# Patient Record
Sex: Male | Born: 1950
Health system: Southern US, Community
[De-identification: ages and names within clinical notes are randomized; demographics above are authoritative.]

## PROBLEM LIST (undated history)

## (undated) DIAGNOSIS — I10 Essential (primary) hypertension: Secondary | ICD-10-CM

## (undated) DIAGNOSIS — I7100 Dissection of unspecified site of aorta: Secondary | ICD-10-CM

## (undated) DIAGNOSIS — I38 Endocarditis, valve unspecified: Secondary | ICD-10-CM

## (undated) DIAGNOSIS — I82409 Acute embolism and thrombosis of unspecified deep veins of unspecified lower extremity: Secondary | ICD-10-CM

## (undated) DIAGNOSIS — I509 Heart failure, unspecified: Secondary | ICD-10-CM

## (undated) DIAGNOSIS — Z95828 Presence of other vascular implants and grafts: Secondary | ICD-10-CM

## (undated) DIAGNOSIS — M869 Osteomyelitis, unspecified: Secondary | ICD-10-CM

## (undated) DIAGNOSIS — K5900 Constipation, unspecified: Secondary | ICD-10-CM

## (undated) DIAGNOSIS — I82411 Acute embolism and thrombosis of right femoral vein: Secondary | ICD-10-CM

## (undated) DIAGNOSIS — M009 Pyogenic arthritis, unspecified: Secondary | ICD-10-CM

## (undated) HISTORY — PX: IRRIGATION AND DEBRIDEMENT ABSCESS: SHX5252

---

## 1985-05-15 HISTORY — PX: REPAIR EXTENSOR TENDON HAND: SUR1171

## 1993-05-15 DIAGNOSIS — I82409 Acute embolism and thrombosis of unspecified deep veins of unspecified lower extremity: Secondary | ICD-10-CM

## 1993-05-15 HISTORY — DX: Acute embolism and thrombosis of unspecified deep veins of unspecified lower extremity: I82.409

## 2013-10-14 DIAGNOSIS — M7989 Other specified soft tissue disorders: Secondary | ICD-10-CM | POA: Diagnosis not present

## 2013-10-14 DIAGNOSIS — M79609 Pain in unspecified limb: Secondary | ICD-10-CM | POA: Diagnosis not present

## 2014-07-14 DIAGNOSIS — I82411 Acute embolism and thrombosis of right femoral vein: Secondary | ICD-10-CM

## 2014-07-14 HISTORY — DX: Acute embolism and thrombosis of right femoral vein: I82.411

## 2014-07-16 ENCOUNTER — Encounter (HOSPITAL_COMMUNITY): Payer: Self-pay | Admitting: Emergency Medicine

## 2014-07-16 ENCOUNTER — Emergency Department (HOSPITAL_COMMUNITY): Payer: Medicare Other

## 2014-07-16 ENCOUNTER — Inpatient Hospital Stay (HOSPITAL_COMMUNITY)
Admission: EM | Admit: 2014-07-16 | Discharge: 2014-07-28 | DRG: 252 | Disposition: A | Discharge status: Home / Self Care | Payer: Medicare Other | Attending: Surgery | Admitting: Surgery

## 2014-07-16 DIAGNOSIS — R52 Pain, unspecified: Secondary | ICD-10-CM

## 2014-07-16 DIAGNOSIS — J9811 Atelectasis: Secondary | ICD-10-CM | POA: Diagnosis present

## 2014-07-16 DIAGNOSIS — I82412 Acute embolism and thrombosis of left femoral vein: Secondary | ICD-10-CM | POA: Diagnosis present

## 2014-07-16 DIAGNOSIS — N289 Disorder of kidney and ureter, unspecified: Secondary | ICD-10-CM | POA: Diagnosis not present

## 2014-07-16 DIAGNOSIS — I82419 Acute embolism and thrombosis of unspecified femoral vein: Secondary | ICD-10-CM

## 2014-07-16 DIAGNOSIS — J90 Pleural effusion, not elsewhere classified: Secondary | ICD-10-CM | POA: Diagnosis present

## 2014-07-16 DIAGNOSIS — I7100 Dissection of unspecified site of aorta: Secondary | ICD-10-CM | POA: Diagnosis present

## 2014-07-16 DIAGNOSIS — N182 Chronic kidney disease, stage 2 (mild): Secondary | ICD-10-CM | POA: Diagnosis present

## 2014-07-16 DIAGNOSIS — I7101 Dissection of thoracic aorta: Secondary | ICD-10-CM | POA: Diagnosis not present

## 2014-07-16 DIAGNOSIS — F17210 Nicotine dependence, cigarettes, uncomplicated: Secondary | ICD-10-CM | POA: Diagnosis present

## 2014-07-16 DIAGNOSIS — R0602 Shortness of breath: Secondary | ICD-10-CM

## 2014-07-16 DIAGNOSIS — I71019 Dissection of thoracic aorta, unspecified: Secondary | ICD-10-CM

## 2014-07-16 DIAGNOSIS — G9341 Metabolic encephalopathy: Secondary | ICD-10-CM | POA: Diagnosis present

## 2014-07-16 DIAGNOSIS — I129 Hypertensive chronic kidney disease with stage 1 through stage 4 chronic kidney disease, or unspecified chronic kidney disease: Secondary | ICD-10-CM | POA: Diagnosis present

## 2014-07-16 DIAGNOSIS — R079 Chest pain, unspecified: Secondary | ICD-10-CM

## 2014-07-16 DIAGNOSIS — I779 Disorder of arteries and arterioles, unspecified: Secondary | ICD-10-CM

## 2014-07-16 DIAGNOSIS — I2699 Other pulmonary embolism without acute cor pulmonale: Secondary | ICD-10-CM | POA: Diagnosis present

## 2014-07-16 DIAGNOSIS — Z86711 Personal history of pulmonary embolism: Secondary | ICD-10-CM

## 2014-07-16 DIAGNOSIS — I10 Essential (primary) hypertension: Secondary | ICD-10-CM

## 2014-07-16 HISTORY — DX: Heart failure, unspecified: I50.9

## 2014-07-16 HISTORY — DX: Osteomyelitis, unspecified: M86.9

## 2014-07-16 HISTORY — DX: Essential (primary) hypertension: I10

## 2014-07-16 HISTORY — DX: Endocarditis, valve unspecified: I38

## 2014-07-16 HISTORY — DX: Pyogenic arthritis, unspecified: M00.9

## 2014-07-16 LAB — COMPREHENSIVE METABOLIC PANEL
ALK PHOS: 72 U/L (ref 39–117)
ALT: 30 U/L (ref 0–53)
AST: 36 U/L (ref 0–37)
Albumin: 3.4 g/dL — ABNORMAL LOW (ref 3.5–5.2)
Anion gap: 5 (ref 5–15)
BUN: 17 mg/dL (ref 6–23)
CHLORIDE: 108 mmol/L (ref 96–112)
CO2: 27 mmol/L (ref 19–32)
Calcium: 9.2 mg/dL (ref 8.4–10.5)
Creatinine, Ser: 1.47 mg/dL — ABNORMAL HIGH (ref 0.50–1.35)
GFR calc Af Amer: 56 mL/min — ABNORMAL LOW (ref >90)
GFR, EST NON AFRICAN AMERICAN: 48 mL/min — AB (ref >90)
GLUCOSE: 104 mg/dL — AB (ref 70–99)
Potassium: 4.1 mmol/L (ref 3.5–5.1)
Sodium: 140 mmol/L (ref 135–145)
Total Bilirubin: 0.6 mg/dL (ref 0.3–1.2)
Total Protein: 7.1 g/dL (ref 6.0–8.3)

## 2014-07-16 LAB — I-STAT CHEM 8, ED
BUN: 18 mg/dL (ref 6–23)
CREATININE: 1.4 mg/dL — AB (ref 0.50–1.35)
Calcium, Ion: 1.22 mmol/L (ref 1.13–1.30)
Chloride: 107 mmol/L (ref 96–112)
Glucose, Bld: 104 mg/dL — ABNORMAL HIGH (ref 70–99)
HCT: 39 % (ref 39.0–52.0)
HEMOGLOBIN: 13.3 g/dL (ref 13.0–17.0)
Potassium: 4.1 mmol/L (ref 3.5–5.1)
SODIUM: 143 mmol/L (ref 135–145)
TCO2: 21 mmol/L (ref 0–100)

## 2014-07-16 LAB — CBC WITH DIFFERENTIAL/PLATELET
Basophils Absolute: 0 10*3/uL (ref 0.0–0.1)
Basophils Relative: 0 % (ref 0–1)
EOS ABS: 0.2 10*3/uL (ref 0.0–0.7)
Eosinophils Relative: 2 % (ref 0–5)
HEMATOCRIT: 37.1 % — AB (ref 39.0–52.0)
Hemoglobin: 12.7 g/dL — ABNORMAL LOW (ref 13.0–17.0)
LYMPHS ABS: 1.5 10*3/uL (ref 0.7–4.0)
LYMPHS PCT: 23 % (ref 12–46)
MCH: 29.6 pg (ref 26.0–34.0)
MCHC: 34.2 g/dL (ref 30.0–36.0)
MCV: 86.5 fL (ref 78.0–100.0)
MONO ABS: 0.3 10*3/uL (ref 0.1–1.0)
Monocytes Relative: 5 % (ref 3–12)
Neutro Abs: 4.7 10*3/uL (ref 1.7–7.7)
Neutrophils Relative %: 70 % (ref 43–77)
PLATELETS: 144 10*3/uL — AB (ref 150–400)
RBC: 4.29 MIL/uL (ref 4.22–5.81)
RDW: 13.5 % (ref 11.5–15.5)
WBC: 6.7 10*3/uL (ref 4.0–10.5)

## 2014-07-16 LAB — TROPONIN I: Troponin I: <0.03 ng/mL (ref <0.031)

## 2014-07-16 LAB — RAPID URINE DRUG SCREEN, HOSP PERFORMED
AMPHETAMINES: NOT DETECTED
BENZODIAZEPINES: NOT DETECTED
Barbiturates: NOT DETECTED
Cocaine: POSITIVE — AB
Opiates: NOT DETECTED
Tetrahydrocannabinol: NOT DETECTED

## 2014-07-16 LAB — URINALYSIS, ROUTINE W REFLEX MICROSCOPIC
Bilirubin Urine: NEGATIVE
Glucose, UA: NEGATIVE mg/dL
Hgb urine dipstick: NEGATIVE
Ketones, ur: NEGATIVE mg/dL
Leukocytes, UA: NEGATIVE
Nitrite: NEGATIVE
Protein, ur: NEGATIVE mg/dL
Specific Gravity, Urine: 1.019 (ref 1.005–1.030)
UROBILINOGEN UA: 1 mg/dL (ref 0.0–1.0)
pH: 5.5 (ref 5.0–8.0)

## 2014-07-16 MED ORDER — SODIUM CHLORIDE 0.9 % IV BOLUS (SEPSIS)
1000.0000 mL | Freq: Once | INTRAVENOUS | Status: AC
  Administered 2014-07-16: 1000 mL via INTRAVENOUS

## 2014-07-16 MED ORDER — GI COCKTAIL ~~LOC~~
30.0000 mL | Freq: Once | ORAL | Status: AC
  Administered 2014-07-16: 30 mL via ORAL
  Filled 2014-07-16: qty 30

## 2014-07-16 NOTE — ED Provider Notes (Signed)
CSN: [id number]     Arrival date & time 07/16/14  2213 History  This chart was scribed for Edward Weisse Alfonso Patten, MD by Rayfield Citizen, ED Scribe. This patient was seen in room B15C/B15C and the patient's care was started at 11:05 PM.    Chief Complaint  Patient presents with  . Chest Pain   Patient is a 64 y.o. male presenting with chest pain. The history is provided by the patient and the EMS personnel. No language interpreter was used.  Chest Pain Pain location:  Substernal area Pain quality: sharp   Pain radiates to:  Neck and L jaw Pain radiates to the back: no   Pain severity:  Mild Onset quality:  Sudden Duration:  90 minutes Timing:  Constant Progression:  Partially resolved Chronicity:  New Context: drug use and at rest   Relieved by:  Nothing Worsened by:  Deep breathing Ineffective treatments:  None tried Associated symptoms: no abdominal pain, no fever, no lower extremity edema, no shortness of breath and not vomiting   Risk factors: smoking      HPI Comments: Edward Hopkins is a 64 y.o. male with past medical history of osteomyelitis, septic arthritis in both hips, endocarditis who presents to the Emergency Department complaining of 1.5 hours of constant substernal chest pain. He states that his pain began suddenly, gradually worsened for approximately ten minutes after onset and has largely resolved at present, currently rated 2/10. His pain radiates to his neck and the left-side of his jaw and is worse with deep breathing. Patient explains he was playing cards at symptom onset, "drinking some gin" and, per nurse's note, "using some cocaine that may have been laced with something." He denies recent stress, abdominal pain, constipation, fevers. He denies any recent long-term periods of immobility (e.g. no plane or car trips over 6-8 hours). He denies prior experience with similar symptoms.  He is a 1ppd smoker, occasional EtOH user. He is not prescribed any regular medications; he  occasionally takes OTC naproxen. His PCP is in [place]; he is visiting the area (traveled by train).   Past Medical History  Diagnosis Date  . Osteomyelitis   . Septic arthritis     both hips   . Endocarditis    History reviewed. No pertinent past surgical history. History reviewed. No pertinent family history. History  Substance Use Topics  . Smoking status: Current Every Day Smoker  . Smokeless tobacco: Not on file  . Alcohol Use: Yes    Review of Systems  Constitutional: Negative for fever.  Respiratory: Negative for shortness of breath.   Cardiovascular: Positive for chest pain.  Gastrointestinal: Negative for vomiting, abdominal pain and constipation.  All other systems reviewed and are negative.   Allergies  Review of patient's allergies indicates no known allergies.  Home Medications   Prior to Admission medications   Not on File   BP 144/100 mmHg  Pulse 87  Temp(Src) 98.1 F (36.7 C)  Resp 20  Ht 5\' 10"  (1.778 m)  Wt 215 lb (97.523 kg)  BMI 30.85 kg/m2  SpO2 97% Physical Exam  Constitutional: He is oriented to person, place, and time. He appears well-developed and well-nourished. No distress.  HENT:  Head: Normocephalic and atraumatic.  Mouth/Throat: Oropharynx is clear and moist. No oropharyngeal exudate.  Moist mucous membranes  Eyes: EOM are normal. Pupils are equal, round, and reactive to light.  Neck: Normal range of motion. Neck supple.  Cardiovascular: Normal rate, regular rhythm and normal heart  sounds.  Exam reveals no gallop and no friction rub.   No murmur heard. Pulmonary/Chest: Effort normal. No respiratory distress. He has no wheezes. He has no rales.  Abdominal: Soft. He exhibits no mass. There is no tenderness. There is no rebound and no guarding.  Good bowel sounds  Musculoskeletal: Normal range of motion. He exhibits no edema.  Neurological: He is alert and oriented to person, place, and time. He displays normal reflexes.  Skin:  Skin is warm and dry. No rash noted.  Psychiatric: He has a normal mood and affect. His behavior is normal.  Nursing note and vitals reviewed.   ED Course  Procedures   DIAGNOSTIC STUDIES: Oxygen Saturation is 97% on RA, adequate by my interpretation.    COORDINATION OF CARE: 11:13 PM Discussed treatment plan with pt at bedside and pt agreed to plan.   Labs Review Labs Reviewed  CBC WITH DIFFERENTIAL/PLATELET  COMPREHENSIVE METABOLIC PANEL  TROPONIN I  URINALYSIS, ROUTINE W REFLEX MICROSCOPIC  URINE RAPID DRUG SCREEN (HOSP PERFORMED)  ETHANOL  URINE RAPID DRUG SCREEN (HOSP PERFORMED)  I-STAT CHEM 8, ED    Imaging Review No results found.   EKG Interpretation   Date/Time:  Thursday July 16 2014 22:20:26 EST Ventricular Rate:  83 PR Interval:  158 QRS Duration: 140 QT Interval:  388 QTC Calculation: 456 R Axis:   -66 Text Interpretation:  Sinus rhythm Multiple ventricular premature  complexes Probable left atrial enlargement RBBB and LAFB Left ventricular  hypertrophy No previous ECGs available Confirmed by Wyvonnia Dusky  MD, STEPHEN  [id number]) on 07/16/2014 10:34:21 PM      MDM   Final diagnoses:  Pain   Results for orders placed or performed during the hospital encounter of 07/16/14  CBC with Differential/Platelet  Result Value Ref Range   WBC 6.7 4.0 - 10.5 K/uL   RBC 4.29 4.22 - 5.81 MIL/uL   Hemoglobin 12.7 (L) 13.0 - 17.0 g/dL   HCT 37.1 (L) 39.0 - 52.0 %   MCV 86.5 78.0 - 100.0 fL   MCH 29.6 26.0 - 34.0 pg   MCHC 34.2 30.0 - 36.0 g/dL   RDW 13.5 11.5 - 15.5 %   Platelets 144 (L) 150 - 400 K/uL   Neutrophils Relative % 70 43 - 77 %   Neutro Abs 4.7 1.7 - 7.7 K/uL   Lymphocytes Relative 23 12 - 46 %   Lymphs Abs 1.5 0.7 - 4.0 K/uL   Monocytes Relative 5 3 - 12 %   Monocytes Absolute 0.3 0.1 - 1.0 K/uL   Eosinophils Relative 2 0 - 5 %   Eosinophils Absolute 0.2 0.0 - 0.7 K/uL   Basophils Relative 0 0 - 1 %   Basophils Absolute 0.0 0.0 - 0.1 K/uL   Comprehensive metabolic panel  Result Value Ref Range   Sodium 140 135 - 145 mmol/L   Potassium 4.1 3.5 - 5.1 mmol/L   Chloride 108 96 - 112 mmol/L   CO2 27 19 - 32 mmol/L   Glucose, Bld 104 (H) 70 - 99 mg/dL   BUN 17 6 - 23 mg/dL   Creatinine, Ser 1.47 (H) 0.50 - 1.35 mg/dL   Calcium 9.2 8.4 - 10.5 mg/dL   Total Protein 7.1 6.0 - 8.3 g/dL   Albumin 3.4 (L) 3.5 - 5.2 g/dL   AST 36 0 - 37 U/L   ALT 30 0 - 53 U/L   Alkaline Phosphatase 72 39 - 117 U/L   Total Bilirubin 0.6  0.3 - 1.2 mg/dL   GFR calc non Af Amer 48 (L) >90 mL/min   GFR calc Af Amer 56 (L) >90 mL/min   Anion gap 5 5 - 15  Troponin I  Result Value Ref Range   Troponin I <0.03 <0.031 ng/mL  Urinalysis, Routine w reflex microscopic  Result Value Ref Range   Color, Urine YELLOW YELLOW   APPearance CLOUDY (A) CLEAR   Specific Gravity, Urine 1.019 1.005 - 1.030   pH 5.5 5.0 - 8.0   Glucose, UA NEGATIVE NEGATIVE mg/dL   Hgb urine dipstick NEGATIVE NEGATIVE   Bilirubin Urine NEGATIVE NEGATIVE   Ketones, ur NEGATIVE NEGATIVE mg/dL   Protein, ur NEGATIVE NEGATIVE mg/dL   Urobilinogen, UA 1.0 0.0 - 1.0 mg/dL   Nitrite NEGATIVE NEGATIVE   Leukocytes, UA NEGATIVE NEGATIVE  Urine rapid drug screen (hosp performed)  Result Value Ref Range   Opiates NONE DETECTED NONE DETECTED   Cocaine POSITIVE (A) NONE DETECTED   Benzodiazepines NONE DETECTED NONE DETECTED   Amphetamines NONE DETECTED NONE DETECTED   Tetrahydrocannabinol NONE DETECTED NONE DETECTED   Barbiturates NONE DETECTED NONE DETECTED  I-Stat Chem 8, ED  Result Value Ref Range   Sodium 143 135 - 145 mmol/L   Potassium 4.1 3.5 - 5.1 mmol/L   Chloride 107 96 - 112 mmol/L   BUN 18 6 - 23 mg/dL   Creatinine, Ser 1.40 (H) 0.50 - 1.35 mg/dL   Glucose, Bld 104 (H) 70 - 99 mg/dL   Calcium, Ion 1.22 1.13 - 1.30 mmol/L   TCO2 21 0 - 100 mmol/L   Hemoglobin 13.3 13.0 - 17.0 g/dL   HCT 39.0 39.0 - 52.0 %   Dg Chest Portable 1 View  07/16/2014   CLINICAL DATA:   Chest pain.  EXAM: PORTABLE CHEST - 1 VIEW  COMPARISON:  None.  FINDINGS: Enlarged aorta, particularly the arch and descending aorta. There is cardiopericardial enlargement as well. Indistinct left diaphragm, likely from the cardiomegaly; no definitive effusion. No failure or pneumonia. No pneumothorax.  Critical Value/emergent results were called by telephone at the time of interpretation on 07/16/2014 at 11:15 pm to Dr. Juleen China, who verbally acknowledged these results.  IMPRESSION: Abnormal aorta, at least aneurysmal. In the setting of chest pain, recommend CTA to evaluate for dissection or mediastinal hematoma.   Electronically Signed   By: Monte Fantasia M.D.   On: 07/16/2014 23:16    Medications  sodium chloride 0.9 % bolus 500 mL (not administered)  fentaNYL (SUBLIMAZE) injection 100 mcg (not administered)  gi cocktail (Maalox,Lidocaine,Donnatal) (30 mLs Oral Given 07/16/14 2329)  sodium chloride 0.9 % bolus 1,000 mL (1,000 mLs Intravenous New Bag/Given 07/16/14 2330)  iohexol (OMNIPAQUE) 350 MG/ML injection 100 mL (100 mLs Intravenous Contrast Given 07/17/14 0057)   CRITICAL CARE Performed by: Carlisle Beers Total critical care time: 91 minutes Critical care time was exclusive of separately billable procedures and treating other patients. Critical care was necessary to treat or prevent imminent or life-threatening deterioration. Critical care was time spent personally by me on the following activities: development of treatment plan with patient and/or surrogate as well as nursing, discussions with consultants, evaluation of patient's response to treatment, examination of patient, obtaining history from patient or surrogate, ordering and performing treatments and interventions, ordering and review of laboratory studies, ordering and review of radiographic studies, pulse oximetry and re-evaluation of patient's condition.  MDM Reviewed: nursing note and vitals Interpretation: labs,  x-ray and ECG (negative troponin positive cocaine  in UDS, on cxr tortuous arch by me) Total time providing critical care: 75-105 minutes. This excludes time spent performing separately reportable procedures and services. Consults: cardiothoracic surgery, radiology viz phone x 2 [employer] and [employer].    I personally performed the services described in this documentation, which was scribed in my presence. The recorded information has been reviewed and is accurate.       Carlisle Beers, MD 07/17/14 [phone]

## 2014-07-16 NOTE — ED Notes (Signed)
Per EMS- pt was drinking gin and did some cocaine that might have been laced with something. Pt began having central chest pain radiating to the neck and jaw to the left. Pt given 324 ASA. Reports pain worse with deep breathes. PT is a smoker. Pt refused nitro.

## 2014-07-16 NOTE — ED Notes (Signed)
Xray tech at bedside.

## 2014-07-17 ENCOUNTER — Encounter (HOSPITAL_COMMUNITY): Payer: Self-pay | Admitting: Radiology

## 2014-07-17 ENCOUNTER — Emergency Department (HOSPITAL_COMMUNITY): Payer: Medicare Other

## 2014-07-17 DIAGNOSIS — G9341 Metabolic encephalopathy: Secondary | ICD-10-CM | POA: Diagnosis present

## 2014-07-17 DIAGNOSIS — I7100 Dissection of unspecified site of aorta: Secondary | ICD-10-CM | POA: Diagnosis not present

## 2014-07-17 DIAGNOSIS — N182 Chronic kidney disease, stage 2 (mild): Secondary | ICD-10-CM | POA: Diagnosis present

## 2014-07-17 DIAGNOSIS — R0602 Shortness of breath: Secondary | ICD-10-CM | POA: Diagnosis not present

## 2014-07-17 DIAGNOSIS — J9811 Atelectasis: Secondary | ICD-10-CM | POA: Diagnosis present

## 2014-07-17 DIAGNOSIS — K922 Gastrointestinal hemorrhage, unspecified: Secondary | ICD-10-CM | POA: Diagnosis not present

## 2014-07-17 DIAGNOSIS — R52 Pain, unspecified: Secondary | ICD-10-CM | POA: Diagnosis not present

## 2014-07-17 DIAGNOSIS — Z86711 Personal history of pulmonary embolism: Secondary | ICD-10-CM | POA: Diagnosis not present

## 2014-07-17 DIAGNOSIS — R0489 Hemorrhage from other sites in respiratory passages: Secondary | ICD-10-CM | POA: Diagnosis not present

## 2014-07-17 DIAGNOSIS — I82409 Acute embolism and thrombosis of unspecified deep veins of unspecified lower extremity: Secondary | ICD-10-CM | POA: Diagnosis not present

## 2014-07-17 DIAGNOSIS — J90 Pleural effusion, not elsewhere classified: Secondary | ICD-10-CM | POA: Diagnosis not present

## 2014-07-17 DIAGNOSIS — N289 Disorder of kidney and ureter, unspecified: Secondary | ICD-10-CM | POA: Diagnosis not present

## 2014-07-17 DIAGNOSIS — I82412 Acute embolism and thrombosis of left femoral vein: Secondary | ICD-10-CM | POA: Diagnosis present

## 2014-07-17 DIAGNOSIS — I7101 Dissection of thoracic aorta: Secondary | ICD-10-CM | POA: Diagnosis not present

## 2014-07-17 DIAGNOSIS — I129 Hypertensive chronic kidney disease with stage 1 through stage 4 chronic kidney disease, or unspecified chronic kidney disease: Secondary | ICD-10-CM | POA: Diagnosis present

## 2014-07-17 DIAGNOSIS — R079 Chest pain, unspecified: Secondary | ICD-10-CM | POA: Diagnosis not present

## 2014-07-17 DIAGNOSIS — I10 Essential (primary) hypertension: Secondary | ICD-10-CM | POA: Diagnosis not present

## 2014-07-17 DIAGNOSIS — I2699 Other pulmonary embolism without acute cor pulmonale: Secondary | ICD-10-CM | POA: Diagnosis not present

## 2014-07-17 DIAGNOSIS — F17210 Nicotine dependence, cigarettes, uncomplicated: Secondary | ICD-10-CM | POA: Diagnosis present

## 2014-07-17 LAB — BASIC METABOLIC PANEL
Anion gap: 7 (ref 5–15)
BUN: 16 mg/dL (ref 6–23)
CO2: 19 mmol/L (ref 19–32)
Calcium: 8 mg/dL — ABNORMAL LOW (ref 8.4–10.5)
Chloride: 109 mmol/L (ref 96–112)
Creatinine, Ser: 1.59 mg/dL — ABNORMAL HIGH (ref 0.50–1.35)
GFR, EST AFRICAN AMERICAN: 51 mL/min — AB (ref >90)
GFR, EST NON AFRICAN AMERICAN: 44 mL/min — AB (ref >90)
Glucose, Bld: 124 mg/dL — ABNORMAL HIGH (ref 70–99)
POTASSIUM: 3.6 mmol/L (ref 3.5–5.1)
SODIUM: 135 mmol/L (ref 135–145)

## 2014-07-17 LAB — CBC
HCT: 33.1 % — ABNORMAL LOW (ref 39.0–52.0)
HEMATOCRIT: 30.6 % — AB (ref 39.0–52.0)
HEMOGLOBIN: 10.3 g/dL — AB (ref 13.0–17.0)
Hemoglobin: 11.2 g/dL — ABNORMAL LOW (ref 13.0–17.0)
MCH: 29.5 pg (ref 26.0–34.0)
MCH: 29.7 pg (ref 26.0–34.0)
MCHC: 33.8 g/dL (ref 30.0–36.0)
MCHC: 33.7 g/dL (ref 30.0–36.0)
MCV: 87.1 fL (ref 78.0–100.0)
MCV: 88.2 fL (ref 78.0–100.0)
PLATELETS: 135 10*3/uL — AB (ref 150–400)
Platelets: 112 10*3/uL — ABNORMAL LOW (ref 150–400)
RBC: 3.8 MIL/uL — AB (ref 4.22–5.81)
RBC: 3.47 MIL/uL — ABNORMAL LOW (ref 4.22–5.81)
RDW: 13.5 % (ref 11.5–15.5)
RDW: 13.8 % (ref 11.5–15.5)
WBC: 6.1 10*3/uL (ref 4.0–10.5)
WBC: 5.9 10*3/uL (ref 4.0–10.5)

## 2014-07-17 LAB — MRSA PCR SCREENING: MRSA by PCR: NEGATIVE

## 2014-07-17 LAB — PROTIME-INR
INR: 1.15 (ref 0.00–1.49)
PROTHROMBIN TIME: 14.8 s (ref 11.6–15.2)

## 2014-07-17 LAB — ABO/RH: ABO/RH(D): B POS

## 2014-07-17 LAB — TYPE AND SCREEN
ABO/RH(D): B POS
Antibody Screen: NEGATIVE

## 2014-07-17 MED ORDER — FENTANYL CITRATE 0.05 MG/ML IJ SOLN
100.0000 ug | Freq: Once | INTRAMUSCULAR | Status: AC
  Administered 2014-07-17: 100 ug via INTRAVENOUS
  Filled 2014-07-17: qty 2

## 2014-07-17 MED ORDER — FAMOTIDINE 20 MG PO TABS
20.0000 mg | ORAL_TABLET | Freq: Two times a day (BID) | ORAL | Status: DC
  Administered 2014-07-17 – 2014-07-28 (×23): 20 mg via ORAL
  Filled 2014-07-17 (×25): qty 1

## 2014-07-17 MED ORDER — SODIUM CHLORIDE 0.9 % IV BOLUS (SEPSIS)
500.0000 mL | Freq: Once | INTRAVENOUS | Status: AC
  Administered 2014-07-17: 500 mL via INTRAVENOUS

## 2014-07-17 MED ORDER — ONDANSETRON HCL 4 MG/2ML IJ SOLN
4.0000 mg | Freq: Four times a day (QID) | INTRAMUSCULAR | Status: DC | PRN
  Administered 2014-07-17: 4 mg via INTRAVENOUS
  Filled 2014-07-17: qty 2

## 2014-07-17 MED ORDER — DEXTROSE-NACL 5-0.45 % IV SOLN
INTRAVENOUS | Status: AC
  Administered 2014-07-17 (×2): via INTRAVENOUS

## 2014-07-17 MED ORDER — SODIUM CHLORIDE 0.9 % IV SOLN: 250.0000 mL | INTRAVENOUS | Status: DC | PRN

## 2014-07-17 MED ORDER — IOHEXOL 350 MG/ML SOLN
100.0000 mL | Freq: Once | INTRAVENOUS | Status: AC | PRN
  Administered 2014-07-17: 100 mL via INTRAVENOUS

## 2014-07-17 MED ORDER — DEXTROSE 5 % IV SOLN
0.5000 mg/min | INTRAVENOUS | Status: DC
  Administered 2014-07-17: 1 mg/min via INTRAVENOUS
  Administered 2014-07-17: 2 mg/min via INTRAVENOUS
  Filled 2014-07-17 (×3): qty 100

## 2014-07-17 MED ORDER — MORPHINE SULFATE 4 MG/ML IJ SOLN
4.0000 mg | INTRAMUSCULAR | Status: DC | PRN
  Administered 2014-07-17: 4 mg via INTRAVENOUS
  Administered 2014-07-17: 2 mg via INTRAVENOUS
  Administered 2014-07-18: 4 mg via INTRAVENOUS
  Filled 2014-07-17 (×5): qty 1

## 2014-07-17 NOTE — ED Notes (Signed)
MD at bedside. 

## 2014-07-17 NOTE — ED Notes (Signed)
Patient transported to CT 

## 2014-07-17 NOTE — ED Notes (Signed)
Patient is resting comfortably. 

## 2014-07-17 NOTE — Progress Notes (Signed)
  Subjective:  Chest pain resolved. A little pain with deep inspiration.  Objective: Vital signs in last 24 hours: Temp:  [97.8 F (36.6 C)-98.1 F (36.7 C)] 97.8 F (36.6 C) (03/04 0430) Pulse Rate:  [68-87] 70 (03/04 0630) Cardiac Rhythm:  [-] Normal sinus rhythm (03/04 0600) Resp:  [13-24] 20 (03/04 0630) BP: (95-144)/(70-109) 112/70 mmHg (03/04 0630) SpO2:  [91 %-100 %] 97 % (03/04 0630) Weight:  [97.523 kg (215 lb)-97.796 kg (215 lb 9.6 oz)] 97.796 kg (215 lb 9.6 oz) (03/04 0434)  Hemodynamic parameters for last 24 hours:    Intake/Output from previous day: 03/03 0701 - 03/04 0700 In: 157 [P.O.:120; I.V.:37] Out: -  Intake/Output this shift:    General appearance: alert and cooperative Neurologic: intact Heart: regular rate and rhythm, S1, S2 normal, no murmur, click, rub or gallop Lungs: clear to auscultation bilaterally Abdomen: soft, non-tender; bowel sounds normal; no masses,  no organomegaly Extremities: extremities normal, atraumatic, no cyanosis or edema Radial, femoral and dp, pt pulses all equal and palpable  Lab Results:  Recent Labs  07/16/14 2259 07/16/14 2324 07/17/14 0520  WBC 6.7  --  6.1  HGB 12.7* 13.3 11.2*  HCT 37.1* 39.0 33.1*  PLT 144*  --  135*   BMET:  Recent Labs  07/16/14 2259 07/16/14 2324  NA 140 143  K 4.1 4.1  CL 108 107  CO2 27  --   GLUCOSE 104* 104*  BUN 17 18  CREATININE 1.47* 1.40*  CALCIUM 9.2  --     PT/INR:  Recent Labs  07/17/14 0156  LABPROT 14.8  INR 1.15   ABG    Component Value Date/Time   TCO2 21 07/16/2014 2324   CBG (last 3)  No results for input(s): GLUCAP in the last 72 hours.  Assessment/Plan: Aortic intramural hematoma. BP is under good control on labetalol 2 mg/min. Check BMET and CBC this afternoon and in am.  Advance diet slowly.  Plan to repeat CT in a few days if creat remains stable. I don't think there is a benefit to repeating it without contrast.  LOS: 0 days     Gilford Raid K 07/17/2014

## 2014-07-17 NOTE — H&P (Signed)
[address]       [place] [address]             [phone]      Cardiothoracic Surgery Admission History and Physical    Edward Hopkins is an 64 y.o. male.   Chief Complaint: chest pain HPI:   The patient is a 64 year old gentleman who is from [place] but has been in [place] for a while living with his girlfriend while he decides about moving down here permanently. He did some cocaine last night and drank a few shots of gin and was playing cards when he started having mid chest pain that worsened and he came to the ER. He was markedly hypertensive on arrival and a CTA of the chest showed what radiology felt was an ascending and descending aortic dissection with mediastinal hematoma. After arrival his pain improved and is currently 2/10. He has a history of hypertension but has not taken his meds for a long time since he ran out and was transitioning from [place] to [facility] with no medical doctor. He smokes 1 ppd for 50 years and drinks alcohol frequently. He has a history of IVDA until 1972 and had a history of endocarditis in the distant past treated with antibiotics.   Past Medical History  Diagnosis Date  . 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 He says he had a groin infection that had to be drained surgically and resulted in bacteremia and septic arthritis   . Septic arthritis     both hips   . Endocarditis many years ago. Does not know which valve. Due to IVDA     History reviewed.  past surgical history: had incision and drainage of right groin infection with sepsis.  History reviewed. No pertinent family history. No history of aortic disease, heart disease or connective tissue disorder.  Social History:  reports that he has been smoking.  1 ppd for 50 years. He does not have any smokeless tobacco history on file. He reports that he drinks alcohol frequently. He reports that he uses illicit drugs (Cocaine and Marijuana). 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 Needs bilateral hip replacement.  Allergies: No Known Allergies   (Not in a hospital admission)  Results for orders placed or performed during the hospital encounter of 07/16/14 (from the past 48 hour(s))  Urinalysis, Routine w reflex microscopic     Status: Abnormal   Collection Time: 07/16/14 10:57 PM  Result Value Ref Range   Color, Urine YELLOW YELLOW   APPearance CLOUDY (A) CLEAR   Specific Gravity, Urine 1.019 1.005 - 1.030   pH 5.5 5.0 - 8.0   Glucose, UA NEGATIVE NEGATIVE mg/dL   Hgb urine dipstick NEGATIVE NEGATIVE   Bilirubin Urine NEGATIVE NEGATIVE   Ketones, ur NEGATIVE NEGATIVE mg/dL   Protein, ur NEGATIVE NEGATIVE mg/dL   Urobilinogen, UA 1.0 0.0 - 1.0 mg/dL   Nitrite NEGATIVE NEGATIVE   Leukocytes, UA NEGATIVE NEGATIVE    Comment: MICROSCOPIC NOT DONE ON URINES WITH NEGATIVE PROTEIN, BLOOD, LEUKOCYTES, NITRITE, OR GLUCOSE <1000 mg/dL.  Urine rapid drug screen (hosp performed)     Status: Abnormal   Collection Time: 07/16/14 10:57 PM  Result Value Ref Range   Opiates NONE DETECTED NONE DETECTED   Cocaine POSITIVE (A) NONE DETECTED   Benzodiazepines NONE DETECTED NONE DETECTED   Amphetamines NONE DETECTED NONE DETECTED   Tetrahydrocannabinol NONE  DETECTED NONE DETECTED   Barbiturates NONE DETECTED NONE DETECTED    Comment:        DRUG SCREEN FOR MEDICAL PURPOSES ONLY.  IF CONFIRMATION IS NEEDED FOR ANY PURPOSE, NOTIFY LAB WITHIN 5 DAYS.        LOWEST DETECTABLE LIMITS FOR URINE DRUG SCREEN Drug Class       Cutoff (ng/mL) Amphetamine      1000 Barbiturate      200 Benzodiazepine   753 Tricyclics       005 Opiates          300 Cocaine          300 THC              50   CBC with Differential/Platelet     Status: Abnormal   Collection Time: 07/16/14 10:59 PM  Result Value Ref Range   WBC 6.7 4.0 - 10.5 K/uL   RBC 4.29 4.22 - 5.81 MIL/uL   Hemoglobin 12.7 (L) 13.0 - 17.0 g/dL   HCT 37.1  (L) 39.0 - 52.0 %   MCV 86.5 78.0 - 100.0 fL   MCH 29.6 26.0 - 34.0 pg   MCHC 34.2 30.0 - 36.0 g/dL   RDW 13.5 11.5 - 15.5 %   Platelets 144 (L) 150 - 400 K/uL   Neutrophils Relative % 70 43 - 77 %   Neutro Abs 4.7 1.7 - 7.7 K/uL   Lymphocytes Relative 23 12 - 46 %   Lymphs Abs 1.5 0.7 - 4.0 K/uL   Monocytes Relative 5 3 - 12 %   Monocytes Absolute 0.3 0.1 - 1.0 K/uL   Eosinophils Relative 2 0 - 5 %   Eosinophils Absolute 0.2 0.0 - 0.7 K/uL   Basophils Relative 0 0 - 1 %   Basophils Absolute 0.0 0.0 - 0.1 K/uL  Comprehensive metabolic panel     Status: Abnormal   Collection Time: 07/16/14 10:59 PM  Result Value Ref Range   Sodium 140 135 - 145 mmol/L   Potassium 4.1 3.5 - 5.1 mmol/L   Chloride 108 96 - 112 mmol/L   CO2 27 19 - 32 mmol/L   Glucose, Bld 104 (H) 70 - 99 mg/dL   BUN 17 6 - 23 mg/dL   Creatinine, Ser 1.47 (H) 0.50 - 1.35 mg/dL   Calcium 9.2 8.4 - 10.5 mg/dL   Total Protein 7.1 6.0 - 8.3 g/dL   Albumin 3.4 (L) 3.5 - 5.2 g/dL   AST 36 0 - 37 U/L   ALT 30 0 - 53 U/L   Alkaline Phosphatase 72 39 - 117 U/L   Total Bilirubin 0.6 0.3 - 1.2 mg/dL   GFR calc non Af Amer 48 (L) >90 mL/min   GFR calc Af Amer 56 (L) >90 mL/min    Comment: (NOTE) The eGFR has been calculated using the CKD EPI equation. This calculation has not been validated in all clinical situations. eGFR's persistently <90 mL/min signify possible Chronic Kidney Disease.    Anion gap 5 5 - 15  Troponin I     Status: None   Collection Time: 07/16/14 10:59 PM  Result Value Ref Range   Troponin I <0.03 <0.031 ng/mL    Comment:        NO INDICATION OF MYOCARDIAL INJURY.   I-Stat Chem 8, ED     Status: Abnormal   Collection Time: 07/16/14 11:24 PM  Result Value Ref Range   Sodium 143 135 - 145 mmol/L  Potassium 4.1 3.5 - 5.1 mmol/L   Chloride 107 96 - 112 mmol/L   BUN 18 6 - 23 mg/dL   Creatinine, Ser 1.40 (H) 0.50 - 1.35 mg/dL   Glucose, Bld 104 (H) 70 - 99 mg/dL   Calcium, Ion 1.22 1.13 -  1.30 mmol/L   TCO2 21 0 - 100 mmol/L   Hemoglobin 13.3 13.0 - 17.0 g/dL   HCT 39.0 39.0 - 52.0 %  Type and screen     Status: None   Collection Time: 07/17/14  1:51 AM  Result Value Ref Range   ABO/RH(D) B POS    Antibody Screen NEG    Sample Expiration 07/20/2014    Dg Chest Portable 1 View  07/16/2014   CLINICAL DATA:  Chest pain.  EXAM: PORTABLE CHEST - 1 VIEW  COMPARISON:  None.  FINDINGS: Enlarged aorta, particularly the arch and descending aorta. There is cardiopericardial enlargement as well. Indistinct left diaphragm, likely from the cardiomegaly; no definitive effusion. No failure or pneumonia. No pneumothorax.  Critical Value/emergent results were called by telephone at the time of interpretation on 07/16/2014 at 11:15 pm to Dr. Juleen China, who verbally acknowledged these results.  IMPRESSION: Abnormal aorta, at least aneurysmal. In the setting of chest pain, recommend CTA to evaluate for dissection or mediastinal hematoma.   Electronically Signed   By: Monte Fantasia M.D.   On: 07/16/2014 23:16   Ct Angio Chest Aorta W/cm &/or Wo/cm  07/17/2014   CLINICAL DATA:  Acute onset of mid chest pain for 3 hours. Pain when taking deep breaths. Initial encounter.  EXAM: CT ANGIOGRAPHY CHEST, ABDOMEN AND PELVIS  TECHNIQUE: Multidetector CT imaging through the chest, abdomen and pelvis was performed using the standard protocol during bolus administration of intravenous contrast. Multiplanar reconstructed images and MIPs were obtained and reviewed to evaluate the vascular anatomy.  CONTRAST:  19m OMNIPAQUE IOHEXOL 350 MG/ML SOLN  COMPARISON:  Chest radiograph performed 07/16/2014  FINDINGS: CTA CHEST FINDINGS  There is evidence of dissection and thrombosis along the ascending and descending thoracic aorta on noncontrast images, with associated moderate to large amount of mediastinal hemorrhage. The dominant mediastinal collection measures 7.9 x 4.9 cm. A small amount of blood is seen tracking into  the left pleural space.  On noncontrast images, there is dilatation of the ascending thoracic aorta to 4.7 cm in maximal diameter, and dilatation of the proximal descending thoracic aorta to 4.8 cm in maximal diameter. This includes the underlying thrombosis. The patent lumen of the thoracic aorta measures 4.4 cm along the ascending thoracic aorta, and 3.8 cm along the proximal descending thoracic aorta.  Mild bilateral atelectasis is noted. No pneumothorax is seen. No masses are identified.  There is rightward deviation of the trachea due to the dilatation of the aortic arch and predominantly left-sided mediastinal hemorrhage. Evaluation for mediastinal lymphadenopathy is limited given mediastinal hemorrhage. The great vessels appear grossly intact.  The thyroid gland is unremarkable in appearance. No axillary lymphadenopathy is seen. Mild bilateral gynecomastia is noted.  No acute osseous abnormalities are seen.  Review of the MIP images confirms the above findings.  CTA ABDOMEN AND PELVIS FINDINGS  The abdominal aorta is unremarkable in appearance, aside from mild scattered calcification along the distal abdominal aorta and its branches, extending into both lower extremities. There is no evidence of dissection. The celiac trunk, superior mesenteric artery, bilateral renal arteries and inferior mesenteric artery appear patent. The IVC is unremarkable in appearance.  Scattered hypodensities are seen within  the liver, nonspecific in appearance. The spleen is unremarkable in appearance. The gallbladder is flattened along the diaphragm and difficult to fully assess. The pancreas and adrenal glands are unremarkable.  Scattered right renal cysts are seen, measuring up to 3.9 cm in size. The left kidney is unremarkable in appearance. No significant perinephric stranding is seen. There is no evidence of hydronephrosis. No renal or ureteral stones are seen.  No free fluid is identified. The small bowel is unremarkable in  appearance. The stomach is within normal limits. No acute vascular abnormalities are seen.  The appendix is normal in caliber and contains air, without evidence for appendicitis. Apparent wall thickening along the mid to distal sigmoid colon may simply reflect intraluminal contents, though sigmoidoscopy could be considered for further evaluation, when and as deemed clinically appropriate.  The bladder is mildly distended and grossly unremarkable. The prostate remains normal in size. No inguinal lymphadenopathy is seen.  No acute osseous abnormalities are identified. Degenerative change is noted at the hip joints bilaterally, with diffuse sclerosis and bony remodeling. Facet disease is noted along the lower thoracic and lumbar spine.  Review of the MIP images confirms the above findings.  IMPRESSION: 1. 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 The dominant mediastinal hematoma measures 7.9 x 4.9 cm. Small amount of blood noted tracking into the left pleural space. 2. Dilatation of the ascending thoracic aorta to 4.7 cm in maximal diameter, and dilatation of the descending thoracic aorta to 4.8 cm in maximal diameter. The patent lumen measures 4.4 cm at the ascending thoracic aorta, and 3.8 cm along the proximal descending thoracic aorta. 3. No dissection flap seen more distally. The abdominal aorta is unremarkable in appearance, aside from mild scattered calcification along the distal abdominal aorta and its branches, extending to both lower extremities. 4. Rightward deviation of the trachea reflects dilatation of the aortic arch and left-sided mediastinal hemorrhage. 5. Apparent wall thickening along the mid to distal sigmoid colon may simply reflect intraluminal contents, though sigmoidoscopy could be considered for further evaluation, when and as deemed clinically appropriate, to exclude mass. 6.  Mild bilateral atelectasis noted. 7. Mild bilateral gynecomastia seen. 8. Scattered nonspecific hypodensities in the liver; scattered right renal cysts seen. 9. Degenerative change noted at the hip joints bilaterally, with diffuse sclerosis and bony remodeling.  Critical Value/emergent results were called by telephone at the time of interpretation on 07/17/2014 at 1:31 am to Dr. Maggie Schwalbe , who verbally acknowledged these results.   Electronically Signed   By: Garald Balding M.D.   On: 07/17/2014 01:59   Ct Angio Abd/pel W/ And/or W/o  07/17/2014   CLINICAL DATA:  Acute onset of mid chest pain for 3 hours. Pain when taking deep breaths. Initial encounter.  EXAM: CT ANGIOGRAPHY CHEST, ABDOMEN AND PELVIS  TECHNIQUE: Multidetector CT imaging through the chest, abdomen and pelvis was performed using the standard protocol during bolus administration of intravenous contrast. Multiplanar reconstructed images and MIPs were obtained and reviewed to evaluate the vascular anatomy.  CONTRAST:  181m OMNIPAQUE IOHEXOL 350 MG/ML SOLN  COMPARISON:  Chest radiograph performed 07/16/2014  FINDINGS: CTA CHEST FINDINGS  There is evidence of dissection and thrombosis along the ascending and descending thoracic aorta on noncontrast images, with associated moderate to large amount of mediastinal hemorrhage. The dominant mediastinal collection measures 7.9 x 4.9 cm. A small amount of blood is seen tracking into the left pleural  space.  On noncontrast images, there is dilatation of the ascending thoracic aorta to 4.7 cm in maximal diameter, and dilatation of the proximal descending thoracic aorta to 4.8 cm in maximal diameter. This includes the underlying thrombosis. The patent lumen of the thoracic aorta measures 4.4 cm along the ascending thoracic aorta, and 3.8 cm along the proximal descending thoracic aorta.  Mild bilateral atelectasis is noted. No pneumothorax is seen. No masses are identified.  There is rightward deviation  of the trachea due to the dilatation of the aortic arch and predominantly left-sided mediastinal hemorrhage. Evaluation for mediastinal lymphadenopathy is limited given mediastinal hemorrhage. The great vessels appear grossly intact.  The thyroid gland is unremarkable in appearance. No axillary lymphadenopathy is seen. Mild bilateral gynecomastia is noted.  No acute osseous abnormalities are seen.  Review of the MIP images confirms the above findings.  CTA ABDOMEN AND PELVIS FINDINGS  The abdominal aorta is unremarkable in appearance, aside from mild scattered calcification along the distal abdominal aorta and its branches, extending into both lower extremities. There is no evidence of dissection. The celiac trunk, superior mesenteric artery, bilateral renal arteries and inferior mesenteric artery appear patent. The IVC is unremarkable in appearance.  Scattered hypodensities are seen within the liver, nonspecific in appearance. The spleen is unremarkable in appearance. The gallbladder is flattened along the diaphragm and difficult to fully assess. The pancreas and adrenal glands are unremarkable.  Scattered right renal cysts are seen, measuring up to 3.9 cm in size. The left kidney is unremarkable in appearance. No significant perinephric stranding is seen. There is no evidence of hydronephrosis. No renal or ureteral stones are seen.  No free fluid is identified. The small bowel is unremarkable in appearance. The stomach is within normal limits. No acute vascular abnormalities are seen.  The appendix is normal in caliber and contains air, without evidence for appendicitis. Apparent wall thickening along the mid to distal sigmoid colon may simply reflect intraluminal contents, though sigmoidoscopy could be considered for further evaluation, when and as deemed clinically appropriate.  The bladder is mildly distended and grossly unremarkable. The prostate remains normal in size. No inguinal lymphadenopathy is seen.  No  acute osseous abnormalities are identified. Degenerative change is noted at the hip joints bilaterally, with diffuse sclerosis and bony remodeling. Facet disease is noted along the lower thoracic and lumbar spine.  Review of the MIP images confirms the above findings.  IMPRESSION: 1. 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 The dominant mediastinal hematoma measures 7.9 x 4.9 cm. Small amount of blood noted tracking into the left pleural space. 2. Dilatation of the ascending thoracic aorta to 4.7 cm in maximal diameter, and dilatation of the descending thoracic aorta to 4.8 cm in maximal diameter. The patent lumen measures 4.4 cm at the ascending thoracic aorta, and 3.8 cm along the proximal descending thoracic aorta. 3. No dissection flap seen more distally. The abdominal aorta is unremarkable in appearance, aside from mild scattered calcification along the distal abdominal aorta and its branches, extending to both lower extremities. 4. Rightward deviation of the trachea reflects dilatation of the aortic arch and left-sided mediastinal hemorrhage. 5. Apparent wall thickening along the mid to distal sigmoid colon may simply reflect intraluminal contents, though sigmoidoscopy could be considered for further evaluation, when and as deemed clinically appropriate, to exclude mass. 6. Mild bilateral atelectasis noted. 7. Mild bilateral gynecomastia seen. 8. Scattered nonspecific hypodensities  in the liver; scattered right renal cysts seen. 9. Degenerative change noted at the hip joints bilaterally, with diffuse sclerosis and bony remodeling.  Critical Value/emergent results were called by telephone at the time of interpretation on 07/17/2014 at 1:31 am to Dr. Maggie Schwalbe , who verbally acknowledged these results.   Electronically Signed   By: Garald Balding M.D.   On: 07/17/2014 01:59    Review of  Systems  Constitutional: Negative for fever, chills, weight loss, malaise/fatigue and diaphoresis.  HENT: Negative.   Eyes: Negative.   Respiratory: Negative for shortness of breath.   Cardiovascular: Positive for chest pain. Negative for palpitations, orthopnea, claudication, leg swelling and PND.  Gastrointestinal: Negative.   Genitourinary: Negative.   Musculoskeletal: Positive for joint pain.       Both hips  Skin: Negative.   Neurological: Negative for dizziness, tingling, sensory change, focal weakness, seizures, loss of consciousness and weakness.  Endo/Heme/Allergies: Negative.   Psychiatric/Behavioral: Negative.     Blood pressure 143/91, pulse 85, temperature 98.1 F (36.7 C), resp. rate 23, height '5\' 10"'  (1.778 m), weight 97.523 kg (215 lb), SpO2 100 %. Physical Exam  Constitutional: He is oriented to person, place, and time. He appears well-developed and well-nourished. No distress.  Short stature  HENT:  Head: Atraumatic.  Mouth/Throat: Oropharynx is clear and moist.  Eyes: Conjunctivae and EOM are normal. Pupils are equal, round, and reactive to light.  Neck: Normal range of motion. Neck supple. No JVD present. No tracheal deviation present. No thyromegaly present.  Cardiovascular: Normal rate, regular rhythm, normal heart sounds and intact distal pulses.  Exam reveals no gallop and no friction rub.   No murmur heard. Both radial, femoral, dp, and pt pulses strong and equal  Respiratory: Effort normal and breath sounds normal. No respiratory distress. He has no wheezes. He has no rales. He exhibits no tenderness.  GI: Soft. Bowel sounds are normal. He exhibits no distension and no mass. There is no tenderness.  Musculoskeletal: Normal range of motion. He exhibits no edema or tenderness.  Lymphadenopathy:    He has no cervical adenopathy.  Neurological: He is alert and oriented to person, place, and time. He has normal strength. No cranial nerve deficit or sensory  deficit.  Skin: Skin is warm and dry. He is not diaphoretic.     Assessment/Plan  I have personally reviewed his CTA of the chest and abdomen. I think he has an acute aortic intramural hematoma due to untreated hypertension with use of cocaine and alcohol tonight. It is not clear where this started but I suspect that it started just beyond the left subclavian and tracked backward into the ascending aorta and distally to the aortic hiatus. There is clotted hematoma around the opacified aortic lumen in the ascending and descending aorta but not a true dissection flap and no flow in a false lumen. There is some motion artifact around the aortic root. There is mild enlargement of the ascending aorta to 43.5 mm and descending aorta to 38 mm. There is mediastinal hematoma anterior to the aortic arch and pulmonary artery but no significant pericardial effusion. There is a small left pleural effusion. The abdominal aorta is unremarkable. His ECG shows no ischemic changes with bifascicular block and LVH. There is no murmur of AI. He has been hypertensive since presentation with pulse in the 70's. I think it is reasonable to admit him to the ICU for tight blood pressure control with labetalol and medical treatment of this. It  is not clear if this started in the ascending aorta and replacing the ascending aorta may not improve his prognosis and may put him through a complicated surgery for little benefit. Hopefully good blood pressure control will allow this to heal without further intervention. I would plan to repeat his CT in a couple days depending on his renal function. I reviewed the CT scans with him and discussed the treatment options and prognosis.   BARTLE,BRYAN K 07/17/2014, 3:11 AM

## 2014-07-17 NOTE — ED Notes (Signed)
Paged cardiothoracic/[facility] to [facility]

## 2014-07-17 NOTE — Progress Notes (Signed)
CT surgery p.m. Rounds  Patient comfortable today after being admitted last night with intramural hematoma of the thoracic aorta  Pain is resolved Blood pressure is well-controlled Continue careful observation

## 2014-07-18 ENCOUNTER — Inpatient Hospital Stay (HOSPITAL_COMMUNITY): Payer: Medicare Other

## 2014-07-18 LAB — COMPREHENSIVE METABOLIC PANEL
ALT: 23 U/L (ref 0–53)
AST: 22 U/L (ref 0–37)
Albumin: 2.7 g/dL — ABNORMAL LOW (ref 3.5–5.2)
Alkaline Phosphatase: 53 U/L (ref 39–117)
Anion gap: 5 (ref 5–15)
BILIRUBIN TOTAL: 1 mg/dL (ref 0.3–1.2)
BUN: 12 mg/dL (ref 6–23)
CALCIUM: 8.2 mg/dL — AB (ref 8.4–10.5)
CHLORIDE: 109 mmol/L (ref 96–112)
CO2: 22 mmol/L (ref 19–32)
Creatinine, Ser: 1.64 mg/dL — ABNORMAL HIGH (ref 0.50–1.35)
GFR calc Af Amer: 49 mL/min — ABNORMAL LOW (ref >90)
GFR calc non Af Amer: 42 mL/min — ABNORMAL LOW (ref >90)
GLUCOSE: 108 mg/dL — AB (ref 70–99)
POTASSIUM: 3.6 mmol/L (ref 3.5–5.1)
Sodium: 136 mmol/L (ref 135–145)
TOTAL PROTEIN: 5.7 g/dL — AB (ref 6.0–8.3)

## 2014-07-18 LAB — CBC
HCT: 31.4 % — ABNORMAL LOW (ref 39.0–52.0)
Hemoglobin: 10.4 g/dL — ABNORMAL LOW (ref 13.0–17.0)
MCH: 29.2 pg (ref 26.0–34.0)
MCHC: 33.1 g/dL (ref 30.0–36.0)
MCV: 88.2 fL (ref 78.0–100.0)
Platelets: 112 10*3/uL — ABNORMAL LOW (ref 150–400)
RBC: 3.56 MIL/uL — ABNORMAL LOW (ref 4.22–5.81)
RDW: 13.7 % (ref 11.5–15.5)
WBC: 6.4 10*3/uL (ref 4.0–10.5)

## 2014-07-18 MED ORDER — ADULT MULTIVITAMIN W/MINERALS CH
1.0000 | ORAL_TABLET | Freq: Every day | ORAL | Status: DC
  Administered 2014-07-18 – 2014-07-28 (×11): 1 via ORAL
  Filled 2014-07-18 (×11): qty 1

## 2014-07-18 MED ORDER — VITAMIN B-1 100 MG PO TABS
100.0000 mg | ORAL_TABLET | Freq: Every day | ORAL | Status: DC
  Administered 2014-07-18 – 2014-07-28 (×11): 100 mg via ORAL
  Filled 2014-07-18 (×11): qty 1

## 2014-07-18 MED ORDER — CLONIDINE HCL 0.1 MG PO TABS
0.1000 mg | ORAL_TABLET | Freq: Two times a day (BID) | ORAL | Status: DC
  Administered 2014-07-18 – 2014-07-28 (×20): 0.1 mg via ORAL
  Filled 2014-07-18 (×21): qty 1

## 2014-07-18 MED ORDER — LABETALOL HCL 5 MG/ML IV SOLN: 10.0000 mg | INTRAVENOUS | Status: DC | PRN

## 2014-07-18 MED ORDER — DEXTROSE-NACL 5-0.45 % IV SOLN: INTRAVENOUS | Status: AC

## 2014-07-18 MED ORDER — LORAZEPAM 1 MG PO TABS
1.0000 mg | ORAL_TABLET | Freq: Every day | ORAL | Status: DC
  Administered 2014-07-18 – 2014-07-23 (×6): 1 mg via ORAL
  Filled 2014-07-18 (×6): qty 1

## 2014-07-18 MED ORDER — HYDROCODONE-ACETAMINOPHEN 5-325 MG PO TABS
1.0000 | ORAL_TABLET | ORAL | Status: DC | PRN
  Administered 2014-07-18 – 2014-07-19 (×2): 2 via ORAL
  Administered 2014-07-19 – 2014-07-20 (×2): 1 via ORAL
  Filled 2014-07-18: qty 1
  Filled 2014-07-18 (×2): qty 2
  Filled 2014-07-18: qty 1

## 2014-07-18 MED ORDER — LORAZEPAM 2 MG/ML IJ SOLN: 1.0000 mg | INTRAMUSCULAR | Status: DC | PRN

## 2014-07-18 MED ORDER — METOPROLOL TARTRATE 12.5 MG HALF TABLET
12.5000 mg | ORAL_TABLET | Freq: Two times a day (BID) | ORAL | Status: DC
  Administered 2014-07-18 – 2014-07-20 (×5): 12.5 mg via ORAL
  Filled 2014-07-18 (×6): qty 1

## 2014-07-18 MED ORDER — FOLIC ACID 1 MG PO TABS
1.0000 mg | ORAL_TABLET | Freq: Every day | ORAL | Status: DC
  Administered 2014-07-18 – 2014-07-28 (×11): 1 mg via ORAL
  Filled 2014-07-18 (×11): qty 1

## 2014-07-18 NOTE — Progress Notes (Signed)
  Subjective: Thoracic aortic intramural hematoma Pain has resolved Blood pressure well controlled We'll mobilize patient out of bed-ask for physical therapy input because of chronic hip problems Patient with significant alcohol and substance abuse history-follow benzodiazepine protocol as needed Chest x-ray today shows mild left pleural effusion-probable sympathetic because hemoglobin has not significant we dropped No Lovenox because  of risk for bleeding  Objective: Vital signs in last 24 hours: Temp:  [98.6 F (37 C)-99.7 F (37.6 C)] 98.6 F (37 C) (03/05 1100) Pulse Rate:  [70-89] 81 (03/05 1300) Cardiac Rhythm:  [-] Normal sinus rhythm (03/05 0753) Resp:  [14-28] 19 (03/05 1300) BP: (65-121)/(48-86) 101/61 mmHg (03/05 1300) SpO2:  [90 %-99 %] 99 % (03/05 1300) Weight:  [219 lb 12.8 oz (99.7 kg)] 219 lb 12.8 oz (99.7 kg) (03/05 0500)  Hemodynamic parameters for last 24 hours:   stable blood pressure and sinus rhythm  Intake/Output from previous day: 03/04 0701 - 03/05 0700 In: 2040 [P.O.:240; I.V.:1800] Out: 900 [Urine:900] Intake/Output this shift: Total I/O In: 540 [P.O.:540] Out: 250 [Urine:250]  No complaints Pulses intact No focal motor deficit  Lab Results:  Recent Labs  07/17/14 1927 07/18/14 0909  WBC 5.9 6.4  HGB 10.3* 10.4*  HCT 30.6* 31.4*  PLT 112* 112*   BMET:  Recent Labs  07/17/14 1927 07/18/14 0248  NA 135 136  K 3.6 3.6  CL 109 109  CO2 19 22  GLUCOSE 124* 108*  BUN 16 12  CREATININE 1.59* 1.64*  CALCIUM 8.0* 8.2*    PT/INR:  Recent Labs  07/17/14 0156  LABPROT 14.8  INR 1.15   ABG    Component Value Date/Time   TCO2 21 07/16/2014 2324   CBG (last 3)  No results for input(s): GLUCAP in the last 72 hours.  Assessment/Plan: S/P   Mobilize out of bed Blood pressure control Follow elevated creatinine, follow left pleural effusion Avoid anticoagulation   LOS: 1 day    VAN TRIGT III,PETER 07/18/2014

## 2014-07-18 NOTE — Progress Notes (Signed)
CT surgery p.m. Rounds  Stable day blood pressure between [id number] systolic Out of bed to chair without pain Creatinine rising but urine output adequate Chest x-ray pending in a.m. to assess left pleural effusion, probable sympathetic No evidence of alcohol withdrawal

## 2014-07-19 ENCOUNTER — Inpatient Hospital Stay (HOSPITAL_COMMUNITY): Payer: Medicare Other

## 2014-07-19 LAB — BASIC METABOLIC PANEL
Anion gap: 8 (ref 5–15)
BUN: 11 mg/dL (ref 6–23)
CO2: 22 mmol/L (ref 19–32)
Calcium: 8.5 mg/dL (ref 8.4–10.5)
Chloride: 107 mmol/L (ref 96–112)
Creatinine, Ser: 1.37 mg/dL — ABNORMAL HIGH (ref 0.50–1.35)
GFR calc Af Amer: 61 mL/min — ABNORMAL LOW (ref >90)
GFR calc non Af Amer: 52 mL/min — ABNORMAL LOW (ref >90)
Glucose, Bld: 130 mg/dL — ABNORMAL HIGH (ref 70–99)
Potassium: 3.8 mmol/L (ref 3.5–5.1)
Sodium: 137 mmol/L (ref 135–145)

## 2014-07-19 LAB — CBC
HCT: 32 % — ABNORMAL LOW (ref 39.0–52.0)
Hemoglobin: 10.6 g/dL — ABNORMAL LOW (ref 13.0–17.0)
MCH: 29.3 pg (ref 26.0–34.0)
MCHC: 33.1 g/dL (ref 30.0–36.0)
MCV: 88.4 fL (ref 78.0–100.0)
Platelets: 115 10*3/uL — ABNORMAL LOW (ref 150–400)
RBC: 3.62 MIL/uL — ABNORMAL LOW (ref 4.22–5.81)
RDW: 13.7 % (ref 11.5–15.5)
WBC: 6.5 10*3/uL (ref 4.0–10.5)

## 2014-07-19 NOTE — Evaluation (Signed)
Physical Therapy Evaluation Patient Details Name: Edward Hopkins MRN: [id number] DOB: 17-Aug-1950 Today's Date: 07/19/2014   History of Present Illness  64 y.o. male admitted with Thoracic aortic intramural hematoma  Clinical Impression  Pt admitted with the above diagnosis. Pt currently with functional limitations due to the deficits listed below (see PT Problem List). Patient states he is ambulating near his baseline, which is moderately antalgic due to bilateral hip pain. Min guard assist for safety with mobility but no loss of balance or physical assist required during PT session. SpO2 did drop to 87% on room air with 2/4 dyspnea while ambulating however there was a poor wave-form noted on monitor and his sats improved immediately upon sitting to 97%. Pt will benefit from skilled PT to increase their independence and safety with mobility to allow discharge to the venue listed below.       Follow Up Recommendations Home health PT    Equipment Recommendations  None recommended by PT    Recommendations for Other Services OT consult     Precautions / Restrictions Precautions Precautions: None Restrictions Weight Bearing Restrictions: No      Mobility  Bed Mobility               General bed mobility comments: In chair at start of therapy  Transfers Overall transfer level: Needs assistance Equipment used: Straight cane Transfers: Sit to/from Stand Sit to Stand: Min guard         General transfer comment: Min guard for safety. VC for technique and hand placement. Difficulty flexing LT hip due to pain.  Ambulation/Gait Ambulation/Gait assistance: Min guard Ambulation Distance (Feet): 165 Feet Assistive device: Straight cane Gait Pattern/deviations: Step-through pattern;Decreased stride length;Shuffle;Antalgic Gait velocity: decreased   General Gait Details: Very rigid and antalgic gait pattern. States he is not far from his baseline. Cues for sequencing with SPC. No  loss of balance noted requiring physical assist. SpO2 did drop to 87% on room air however wave-form was not consistent and rose to 97% upon sitting.  Stairs            Wheelchair Mobility    Modified Rankin (Stroke Patients Only)       Balance                                             Pertinent Vitals/Pain Pain Assessment: 0-10 Pain Score: 6  Pain Location: Lt hip > Rt hip Pain Descriptors / Indicators: Constant Pain Intervention(s): Limited activity within patient's tolerance;Monitored during session;Repositioned    Home Living Family/patient expects to be discharged to:: Private residence Living Arrangements: Spouse/significant other Available Help at Discharge: Family;Available 24 hours/day Type of Home: House Home Access: Stairs to enter Entrance Stairs-Rails: None Entrance Stairs-Number of Steps: 2 Home Layout: One level Home Equipment: Cane - single point      Prior Function Level of Independence: Independent with assistive device(s)         Comments: cane for mobility     Hand Dominance   Dominant Hand: Right    Extremity/Trunk Assessment   Upper Extremity Assessment: Defer to OT evaluation           Lower Extremity Assessment: Generalized weakness (Hx of bil hip arthritis)         Communication   Communication: No difficulties  Cognition Arousal/Alertness: Awake/alert Behavior During Therapy: WFL for tasks assessed/performed Overall  Cognitive Status: Within Functional Limits for tasks assessed                      General Comments      Exercises General Exercises - Lower Extremity Ankle Circles/Pumps: AROM;Both;10 reps;Seated      Assessment/Plan    PT Assessment Patient needs continued PT services  PT Diagnosis Difficulty walking;Abnormality of gait;Generalized weakness;Acute pain   PT Problem List Decreased strength;Decreased range of motion;Decreased activity tolerance;Decreased  balance;Decreased mobility;Decreased knowledge of use of DME;Cardiopulmonary status limiting activity;Pain  PT Treatment Interventions DME instruction;Gait training;Stair training;Functional mobility training;Therapeutic activities;Therapeutic exercise;Balance training;Neuromuscular re-education;Patient/family education;Modalities   PT Goals (Current goals can be found in the Care Plan section) Acute Rehab PT Goals Patient Stated Goal: go home PT Goal Formulation: With patient Time For Goal Achievement: 08/02/14 Potential to Achieve Goals: Good    Frequency Min 3X/week   Barriers to discharge        Co-evaluation               End of Session Equipment Utilized During Treatment: Gait belt Activity Tolerance: Patient tolerated treatment well Patient left: in chair;with call bell/phone within reach Nurse Communication: Mobility status         Time: [id number] PT Time Calculation (min) (ACUTE ONLY): 23 min   Charges:   PT Evaluation $Initial PT Evaluation Tier I: 1 Procedure PT Treatments $Gait Training: 8-22 mins   PT G CodesEllouise Newer 07/19/2014, 9:28 AM Elayne Snare, [place]

## 2014-07-19 NOTE — Progress Notes (Signed)
  Subjective: No chest pain, blood pressure well controlled Creatinine improving Chest x-ray shows decreased left pleural effusion Mobilizing out of bed Doing well-probable transfer to floor tomorrow  Objective: Vital signs in last 24 hours: Temp:  [98.3 F (36.8 C)-99.9 F (37.7 C)] 99.4 F (37.4 C) (03/06 1500) Pulse Rate:  [53-99] 62 (03/06 1700) Cardiac Rhythm:  [-] Normal sinus rhythm (03/06 1700) Resp:  [13-36] 20 (03/06 1700) BP: (103-134)/(58-93) 128/89 mmHg (03/06 1600) SpO2:  [92 %-99 %] 95 % (03/06 1700) Weight:  [221 lb 9 oz (100.5 kg)] 221 lb 9 oz (100.5 kg) (03/06 0440)  Hemodynamic parameters for last 24 hours:   stable  Intake/Output from previous day: 03/05 0701 - 03/06 0700 In: 960 [P.O.:960] Out: 1075 [Urine:1075] Intake/Output this shift: Total I/O In: 200 [P.O.:200] Out: 575 [Urine:575]  Neuro intact Extremities warm Abdomen soft  Lab Results:  Recent Labs  07/18/14 0909 07/19/14 0320  WBC 6.4 6.5  HGB 10.4* 10.6*  HCT 31.4* 32.0*  PLT 112* 115*   BMET:  Recent Labs  07/18/14 0248 07/19/14 0320  NA 136 137  K 3.6 3.8  CL 109 107  CO2 22 22  GLUCOSE 108* 130*  BUN 12 11  CREATININE 1.64* 1.37*  CALCIUM 8.2* 8.5    PT/INR:  Recent Labs  07/17/14 0156  LABPROT 14.8  INR 1.15   ABG    Component Value Date/Time   TCO2 21 07/16/2014 2324   CBG (last 3)  No results for input(s): GLUCAP in the last 72 hours.  Assessment/Plan: S/P   Continue blood pressure control, monitor creatinine   LOS: 2 days    VAN TRIGT III,Amrom Ore 07/19/2014

## 2014-07-20 ENCOUNTER — Inpatient Hospital Stay (HOSPITAL_COMMUNITY): Payer: Medicare Other

## 2014-07-20 LAB — BASIC METABOLIC PANEL
Anion gap: 5 (ref 5–15)
BUN: 10 mg/dL (ref 6–23)
CO2: 24 mmol/L (ref 19–32)
Calcium: 8.2 mg/dL — ABNORMAL LOW (ref 8.4–10.5)
Chloride: 106 mmol/L (ref 96–112)
Creatinine, Ser: 1.22 mg/dL (ref 0.50–1.35)
GFR calc Af Amer: 70 mL/min — ABNORMAL LOW (ref >90)
GFR calc non Af Amer: 60 mL/min — ABNORMAL LOW (ref >90)
Glucose, Bld: 107 mg/dL — ABNORMAL HIGH (ref 70–99)
Potassium: 3.8 mmol/L (ref 3.5–5.1)
Sodium: 135 mmol/L (ref 135–145)

## 2014-07-20 LAB — CBC
HCT: 29.7 % — ABNORMAL LOW (ref 39.0–52.0)
Hemoglobin: 10.1 g/dL — ABNORMAL LOW (ref 13.0–17.0)
MCH: 29.5 pg (ref 26.0–34.0)
MCHC: 34 g/dL (ref 30.0–36.0)
MCV: 86.8 fL (ref 78.0–100.0)
Platelets: 119 10*3/uL — ABNORMAL LOW (ref 150–400)
RBC: 3.42 MIL/uL — ABNORMAL LOW (ref 4.22–5.81)
RDW: 13.4 % (ref 11.5–15.5)
WBC: 6 10*3/uL (ref 4.0–10.5)

## 2014-07-20 MED ORDER — LABETALOL HCL 100 MG PO TABS
100.0000 mg | ORAL_TABLET | Freq: Three times a day (TID) | ORAL | Status: DC
  Administered 2014-07-20 – 2014-07-22 (×7): 100 mg via ORAL
  Filled 2014-07-20 (×9): qty 1

## 2014-07-20 MED ORDER — HYDROCODONE-ACETAMINOPHEN 5-325 MG PO TABS
1.0000 | ORAL_TABLET | Freq: Four times a day (QID) | ORAL | Status: DC | PRN
  Administered 2014-07-20 – 2014-07-24 (×6): 2 via ORAL
  Administered 2014-07-26 – 2014-07-27 (×5): 1 via ORAL
  Filled 2014-07-20: qty 2
  Filled 2014-07-20: qty 1
  Filled 2014-07-20: qty 2
  Filled 2014-07-20: qty 1
  Filled 2014-07-20 (×2): qty 2
  Filled 2014-07-20: qty 1
  Filled 2014-07-20 (×2): qty 2
  Filled 2014-07-20 (×3): qty 1

## 2014-07-20 MED ORDER — HYDRALAZINE HCL 20 MG/ML IJ SOLN: 10.0000 mg | INTRAMUSCULAR | Status: DC | PRN

## 2014-07-20 NOTE — Progress Notes (Signed)
Transferred to 2W32 via wheelchair. Portable monitor on. No changes. Report given to Allayne Gitelman, RN

## 2014-07-20 NOTE — Progress Notes (Signed)
Physical Therapy Treatment Patient Details Name: Edward Hopkins MRN: [id number] DOB: Aug 02, 1950 Today's Date: 07/20/2014    History of Present Illness 64 y.o. male admitted with Thoracic aortic intramural hematoma    PT Comments    Continues to make good progress towards physical therapy goals. Ambulates up to 450 feet today with his cane and min guard assist. States hip stiffness is improving but still problematic for his mobility. Tolerates therapeutic exercises well. BP 128/89 in sitting prior to ambulating, 133/97 in sitting after ambulating. Patient will continue to benefit from skilled physical therapy services to further improve independence with functional mobility.   Follow Up Recommendations  Home health PT     Equipment Recommendations  None recommended by PT    Recommendations for Other Services OT consult     Precautions / Restrictions Precautions Precautions: None Restrictions Weight Bearing Restrictions: No    Mobility  Bed Mobility Overal bed mobility: Modified Independent                Transfers Overall transfer level: Needs assistance Equipment used: Straight cane Transfers: Sit to/from Stand Sit to Stand: Min guard         General transfer comment: Min guard for safety. VC for hand placement. Slow to rise, unable to flex Lt hip. Poor control with descent onto bed.  Ambulation/Gait Ambulation/Gait assistance: Min guard Ambulation Distance (Feet): 450 Feet Assistive device: Straight cane Gait Pattern/deviations: Step-through pattern;Decreased stride length;Shuffle;Antalgic Gait velocity: decreased   General Gait Details: Continues to demonstrate rigid and antalgic gait pattern. States some improvement in stiffness today which improves further with ambulating. No buckling noted however demonstrates a moderate amount of sway with gait. Correctly using SPC for support.    Stairs            Wheelchair Mobility    Modified Rankin (Stroke  Patients Only)       Balance Overall balance assessment: Needs assistance Sitting-balance support: No upper extremity supported;Feet supported Sitting balance-Leahy Scale: Good     Standing balance support: No upper extremity supported Standing balance-Leahy Scale: Fair                      Cognition Arousal/Alertness: Awake/alert Behavior During Therapy: WFL for tasks assessed/performed Overall Cognitive Status: Within Functional Limits for tasks assessed                      Exercises General Exercises - Lower Extremity Ankle Circles/Pumps: AROM;Both;Seated;20 reps Quad Sets: Strengthening;Both;10 reps;Supine Gluteal Sets: Strengthening;Both;10 reps;Supine Heel Slides: Strengthening;Both;10 reps;Supine    General Comments General comments (skin integrity, edema, etc.): BP start of therapy 128/89. BP End of therapy 133/97. Both taken in sitting. - HR up to 110 bpm.SpO2 99% on room air.      Pertinent Vitals/Pain Pain Assessment: 0-10 Pain Score:  ("a little better today, but still stiff." no value given) Pain Location: Bil hips Pain Intervention(s): Monitored during session;Repositioned    Home Living                      Prior Function            PT Goals (current goals can now be found in the care plan section) Acute Rehab PT Goals Patient Stated Goal: go home PT Goal Formulation: With patient Time For Goal Achievement: 08/02/14 Potential to Achieve Goals: Good Progress towards PT goals: Progressing toward goals    Frequency  Min 3X/week  PT Plan Current plan remains appropriate    Co-evaluation             End of Session Equipment Utilized During Treatment: Gait belt Activity Tolerance: Patient tolerated treatment well Patient left: with call bell/phone within reach;in bed;with bed alarm set     Time: [id number] PT Time Calculation (min) (ACUTE ONLY): 19 min  Charges:  $Gait Training: 8-22 mins                     G Codes:      Edward Hopkins 23-Jul-2014, 3:05 PM Edward Hopkins Edward Hopkins, Edward Hopkins

## 2014-07-20 NOTE — Progress Notes (Addendum)
[facility] DAILY ICU PROGRESS NOTE                   [address]            [place] [address]          [phone]        Total Length of Stay:  LOS: 3 days   Subjective: Feels ok , mild dull discomfort left shoulder region  Objective: Vital signs in last 24 hours: Temp:  [98 F (36.7 C)-99.6 F (37.6 C)] 98 F (36.7 C) (03/07 0811) Pulse Rate:  [62-85] 70 (03/07 0900) Cardiac Rhythm:  [-] Normal sinus rhythm (03/07 0800) Resp:  [17-27] 17 (03/07 0900) BP: (103-163)/(67-103) 106/81 mmHg (03/07 0900) SpO2:  [92 %-97 %] 94 % (03/07 0900) Weight:  [218 lb 11.1 oz (99.2 kg)] 218 lb 11.1 oz (99.2 kg) (03/07 0500)  Filed Weights   07/18/14 0500 07/19/14 0440 07/20/14 0500  Weight: 219 lb 12.8 oz (99.7 kg) 221 lb 9 oz (100.5 kg) 218 lb 11.1 oz (99.2 kg)    Weight change: -2 lb 13.9 oz (-1.3 kg)   Hemodynamic parameters for last 24 hours:    Intake/Output from previous day: 03/06 0701 - 03/07 0700 In: 500 [P.O.:500] Out: 1200 [Urine:1200]  Intake/Output this shift: Total I/O In: 240 [P.O.:240] Out: 100 [Urine:100]  Current Meds: Scheduled Meds: . cloNIDine  0.1 mg Oral BID  . famotidine  20 mg Oral BID  . folic acid  1 mg Oral Daily  . LORazepam  1 mg Oral QHS  . metoprolol tartrate  12.5 mg Oral BID  . multivitamin with minerals  1 tablet Oral Daily  . thiamine  100 mg Oral Daily   Continuous Infusions:  PRN Meds:.sodium chloride, HYDROcodone-acetaminophen, labetalol, LORazepam, morphine injection, ondansetron (ZOFRAN) IV  General appearance: alert, cooperative and no distress Heart: regular rate and rhythm and occas extrasysole Lungs: clear to auscultation bilaterally Abdomen: benign Extremities: no edema, palp DP bilat Wound: n/a  Lab Results: CBC:  Recent Labs  07/19/14 0320 07/20/14 0314  WBC 6.5 6.0  HGB 10.6* 10.1*  HCT 32.0* 29.7*  PLT 115* 119*   BMET:   Recent Labs  07/19/14 0320 07/20/14 0314  NA 137 135  K 3.8 3.8   CL 107 106  CO2 22 24  GLUCOSE 130* 107*  BUN 11 10  CREATININE 1.37* 1.22  CALCIUM 8.5 8.2*    PT/INR: No results for input(s): LABPROT, INR in the last 72 hours. Radiology: Dg Chest Port 1 View  07/19/2014   CLINICAL DATA:  Acute thoracic aortic dissection and mediastinal hematoma.  EXAM: PORTABLE CHEST - 1 VIEW  COMPARISON:  07/18/2014  FINDINGS: Left pleural effusion appears decreased in size. Decreased atelectasis seen in the left retrocardiac lung base. Right lung remains clear. Cardiomegaly and mediastinal widening appears stable. No pneumothorax visualized.  IMPRESSION: Decreased left pleural effusion and left lower lung atelectasis.  Stable cardiomegaly and mediastinal widening.   Electronically Signed   By: Earle Gell M.D.   On: 07/19/2014 07:35   07/20/2014 CLINICAL DATA: Shortness of breath.  EXAM: CHEST 2 VIEW  COMPARISON: 07/19/2014. CT 07/17/2014.  FINDINGS: Mediastinum widening is present consistent with patient's known mediastinal hematoma. Stable cardiomegaly. Normal pulmonary vascularity. Mild bibasilar atelectasis. Small bilateral effusions, left side greater right noted. No pneumothorax. No acute bony abnormality.  IMPRESSION: 1. Stable prominence of the mediastinum consistent with known mediastinal hematoma. 2. Mild bibasilar atelectasis with small bilateral  pleural effusions, left side greater than right.   Electronically Signed  By: Marcello Moores Register  On: 07/20/2014 07:56   Assessment/Plan:  1 acute intramural aortic hematoma- clinically stable, BP control is improving, currently on lopressor 12.5 bid, clonidine 0.1 bid and prn labetolol 2 creat improved today 3 CXR is stable, will need repeat CT in next few days 4 tx to [place] E 07/20/2014 12:05 PM  Chart reviewed, patient examined, agree with above. His BP is up this afternoon. Will switch lopressor to labetalol. Continue clonidine. Add prn hydralazine. He reports some  back pain in one spot with deep breath only which is probably due to his small pleural effusion. If his creat is ok tomorrow will plan to repeat CTA.

## 2014-07-21 ENCOUNTER — Inpatient Hospital Stay (HOSPITAL_COMMUNITY): Payer: Medicare Other

## 2014-07-21 ENCOUNTER — Encounter (HOSPITAL_COMMUNITY): Payer: Self-pay | Admitting: Radiology

## 2014-07-21 DIAGNOSIS — R0602 Shortness of breath: Secondary | ICD-10-CM

## 2014-07-21 LAB — BASIC METABOLIC PANEL
Anion gap: 6 (ref 5–15)
BUN: 11 mg/dL (ref 6–23)
CALCIUM: 8.5 mg/dL (ref 8.4–10.5)
CO2: 26 mmol/L (ref 19–32)
CREATININE: 1.32 mg/dL (ref 0.50–1.35)
Chloride: 105 mmol/L (ref 96–112)
GFR calc non Af Amer: 55 mL/min — ABNORMAL LOW (ref >90)
GFR, EST AFRICAN AMERICAN: 63 mL/min — AB (ref >90)
Glucose, Bld: 100 mg/dL — ABNORMAL HIGH (ref 70–99)
Potassium: 3.7 mmol/L (ref 3.5–5.1)
Sodium: 137 mmol/L (ref 135–145)

## 2014-07-21 LAB — CBC
HCT: 30.3 % — ABNORMAL LOW (ref 39.0–52.0)
HEMOGLOBIN: 10.2 g/dL — AB (ref 13.0–17.0)
MCH: 29.1 pg (ref 26.0–34.0)
MCHC: 33.7 g/dL (ref 30.0–36.0)
MCV: 86.3 fL (ref 78.0–100.0)
PLATELETS: 144 10*3/uL — AB (ref 150–400)
RBC: 3.51 MIL/uL — ABNORMAL LOW (ref 4.22–5.81)
RDW: 13.2 % (ref 11.5–15.5)
WBC: 6 10*3/uL (ref 4.0–10.5)

## 2014-07-21 MED ORDER — IOHEXOL 350 MG/ML SOLN
100.0000 mL | Freq: Once | INTRAVENOUS | Status: AC | PRN
  Administered 2014-07-21: 100 mL via INTRAVENOUS

## 2014-07-21 NOTE — Progress Notes (Addendum)
      Edward Hopkins [address]       [place] [address]             [phone]      Subjective:  Edward Hopkins complains of being short of breath and an episode of dizziness this morning.  He denies any further back or chest pain.   Objective: Vital signs in last 24 hours: Temp:  [98 F (36.7 C)-99.5 F (37.5 C)] 98.3 F (36.8 C) (03/08 0446) Pulse Rate:  [57-86] 80 (03/08 0446) Cardiac Rhythm:  [-] Normal sinus rhythm (03/08 0000) Resp:  [17-24] 20 (03/08 0446) BP: (106-164)/(81-108) 124/83 mmHg (03/08 0446) SpO2:  [89 %-100 %] 100 % (03/08 0446) Weight:  [214 lb 4.6 oz (97.2 kg)-218 lb 14.7 oz (99.3 kg)] 214 lb 4.6 oz (97.2 kg) (03/08 0446)  Intake/Output from previous day: 03/07 0701 - 03/08 0700 In: 720 [P.O.:720] Out: 1075 [Urine:1075]  General appearance: alert, cooperative and no distress Heart: regular rate and rhythm Lungs: clear to auscultation bilaterally Abdomen: soft, non-tender; bowel sounds normal; no masses,  no organomegaly Extremities: extremities normal, atraumatic, no cyanosis or edema  Lab Results:  Recent Labs  07/20/14 0314 07/21/14 0520  WBC 6.0 6.0  HGB 10.1* 10.2*  HCT 29.7* 30.3*  PLT 119* 144*   BMET:  Recent Labs  07/20/14 0314 07/21/14 0520  NA 135 137  K 3.8 3.7  CL 106 105  CO2 24 26  GLUCOSE 107* 100*  BUN 10 11  CREATININE 1.22 1.32  CALCIUM 8.2* 8.5    PT/INR: No results for input(s): LABPROT, INR in the last 72 hours. ABG    Component Value Date/Time   TCO2 21 07/16/2014 2324   CBG (last 3)  No results for input(s): GLUCAP in the last 72 hours.  Assessment/Plan:  1. Acute Intramural Aortic Hematoma- hemodynamically stable, blood pressure improved with transition to Labatelol, Clonidine, continue Hydralazine prn 2. Pulm- + dyspnea, CXR yesterday with atelectasis/small effusions continue IS 3. Renal- mild bump in creatinine yesterday, but overall remains stable, baseline on admission was 1.47 4. Dispo-  patient remains stable, creatinine elevated at 1.32, however this is about his baseline level, CTA chest possible today if felt renal function is stable by Dr. Cyndia Bent   LOS: 4 days    Edward Hopkins 07/21/2014   Chart reviewed, patient examined, agree with above. His creat is stable so will do follow up CTA of chest and abdomen.  BP is under good control.

## 2014-07-21 NOTE — Progress Notes (Signed)
Patient ID: Edward Hopkins, male   DOB: 03/08/1951, 64 y.o.   MRN: [id number]  Discussed his CTA of the chest and abdomen with Dr. Vernard Gambles and I have reviewed his scans myself. The aortic intramural hematoma is stable. The anterior mediastinal hematoma is stable. There are small bilateral pleural effusions. There is also a small LLL pulmonary embolus that was there on the original scan when he was admitted. I would think that this is probably a chronic finding since it was present at the time of admission. Will get venous dopplers of the legs tomorrow to assess for DVT. He can't be anticoagulated and I would not consider an IVC filter unless there was acute DVT.

## 2014-07-22 ENCOUNTER — Inpatient Hospital Stay (HOSPITAL_COMMUNITY): Payer: Medicare Other

## 2014-07-22 ENCOUNTER — Encounter (HOSPITAL_COMMUNITY): Payer: Self-pay | Admitting: Radiology

## 2014-07-22 DIAGNOSIS — I82419 Acute embolism and thrombosis of unspecified femoral vein: Secondary | ICD-10-CM | POA: Insufficient documentation

## 2014-07-22 DIAGNOSIS — R0602 Shortness of breath: Secondary | ICD-10-CM

## 2014-07-22 DIAGNOSIS — R52 Pain, unspecified: Secondary | ICD-10-CM

## 2014-07-22 HISTORY — PX: VENA CAVA FILTER PLACEMENT: SUR1032

## 2014-07-22 LAB — BASIC METABOLIC PANEL
Anion gap: 3 — ABNORMAL LOW (ref 5–15)
BUN: 12 mg/dL (ref 6–23)
CHLORIDE: 104 mmol/L (ref 96–112)
CO2: 24 mmol/L (ref 19–32)
CREATININE: 1.34 mg/dL (ref 0.50–1.35)
Calcium: 8.3 mg/dL — ABNORMAL LOW (ref 8.4–10.5)
GFR calc Af Amer: 62 mL/min — ABNORMAL LOW (ref >90)
GFR, EST NON AFRICAN AMERICAN: 54 mL/min — AB (ref >90)
GLUCOSE: 100 mg/dL — AB (ref 70–99)
Potassium: 3.8 mmol/L (ref 3.5–5.1)
Sodium: 131 mmol/L — ABNORMAL LOW (ref 135–145)

## 2014-07-22 MED ORDER — LABETALOL HCL 5 MG/ML IV SOLN
INTRAVENOUS | Status: AC
  Filled 2014-07-22: qty 4

## 2014-07-22 MED ORDER — IOHEXOL 300 MG/ML  SOLN
100.0000 mL | Freq: Once | INTRAMUSCULAR | Status: AC | PRN
  Administered 2014-07-22: 40 mL via INTRAVENOUS

## 2014-07-22 MED ORDER — LIDOCAINE HCL 1 % IJ SOLN
INTRAMUSCULAR | Status: AC
  Filled 2014-07-22: qty 20

## 2014-07-22 MED ORDER — HYDRALAZINE HCL 20 MG/ML IJ SOLN
10.0000 mg | INTRAMUSCULAR | Status: DC | PRN
  Administered 2014-07-24 – 2014-07-25 (×2): 10 mg via INTRAVENOUS
  Filled 2014-07-22 (×2): qty 1

## 2014-07-22 MED ORDER — LABETALOL HCL 5 MG/ML IV SOLN
0.5000 mg/min | INTRAVENOUS | Status: DC
  Administered 2014-07-22: 0.5 mg/min via INTRAVENOUS
  Administered 2014-07-23 (×2): 2.5 mg/min via INTRAVENOUS
  Filled 2014-07-22 (×6): qty 100

## 2014-07-22 MED ORDER — MORPHINE SULFATE 4 MG/ML IJ SOLN
4.0000 mg | INTRAMUSCULAR | Status: DC | PRN
  Administered 2014-07-22: 4 mg via INTRAVENOUS
  Filled 2014-07-22: qty 1

## 2014-07-22 MED ORDER — HYDRALAZINE HCL 20 MG/ML IJ SOLN
INTRAMUSCULAR | Status: AC
  Filled 2014-07-22: qty 1

## 2014-07-22 MED ORDER — MIDAZOLAM HCL 2 MG/2ML IJ SOLN
INTRAMUSCULAR | Status: AC
  Filled 2014-07-22: qty 2

## 2014-07-22 MED ORDER — NITROGLYCERIN 0.4 MG SL SUBL
SUBLINGUAL_TABLET | SUBLINGUAL | Status: AC
  Filled 2014-07-22: qty 2.5

## 2014-07-22 MED ORDER — LABETALOL HCL 5 MG/ML IV SOLN
20.0000 mg | INTRAVENOUS | Status: DC | PRN
  Administered 2014-07-22: 20 mg via INTRAVENOUS

## 2014-07-22 MED ORDER — FENTANYL CITRATE 0.05 MG/ML IJ SOLN
INTRAMUSCULAR | Status: AC | PRN
  Administered 2014-07-22: 50 ug via INTRAVENOUS
  Administered 2014-07-22: 25 ug via INTRAVENOUS

## 2014-07-22 MED ORDER — SODIUM CHLORIDE 0.9 % IV SOLN
INTRAVENOUS | Status: AC | PRN
  Administered 2014-07-22: 50 mL/h via INTRAVENOUS

## 2014-07-22 MED ORDER — FENTANYL CITRATE 0.05 MG/ML IJ SOLN
INTRAMUSCULAR | Status: AC
  Filled 2014-07-22: qty 2

## 2014-07-22 MED ORDER — MORPHINE SULFATE 4 MG/ML IJ SOLN
INTRAMUSCULAR | Status: AC
  Administered 2014-07-22: 17:00:00
  Filled 2014-07-22: qty 1

## 2014-07-22 MED ORDER — HYDRALAZINE HCL 20 MG/ML IJ SOLN
20.0000 mg | Freq: Once | INTRAMUSCULAR | Status: AC
  Administered 2014-07-22: 20 mg via INTRAVENOUS

## 2014-07-22 MED ORDER — CETYLPYRIDINIUM CHLORIDE 0.05 % MT LIQD
7.0000 mL | Freq: Two times a day (BID) | OROMUCOSAL | Status: DC
  Administered 2014-07-22 – 2014-07-28 (×12): 7 mL via OROMUCOSAL

## 2014-07-22 MED ORDER — HYDRALAZINE HCL 25 MG PO TABS
25.0000 mg | ORAL_TABLET | Freq: Three times a day (TID) | ORAL | Status: DC
  Administered 2014-07-22: 25 mg via ORAL
  Filled 2014-07-22 (×4): qty 1

## 2014-07-22 NOTE — Progress Notes (Signed)
Patient complaining of chest pain 10/10. BP elevated. EKG ordered STAT. Debbie with rapid response called and MD paged. Afleming, RN

## 2014-07-22 NOTE — Progress Notes (Signed)
Rapid response at patient's bedside. Afleming, RN

## 2014-07-22 NOTE — Sedation Documentation (Signed)
Awaiting MD 

## 2014-07-22 NOTE — Procedures (Signed)
IVC filter No comp

## 2014-07-22 NOTE — Progress Notes (Signed)
      [address]       [place] [address]             [phone]      Subjective:  Mr. Fadeley has no new complaints this morning.    Objective: Vital signs in last 24 hours: Temp:  [98 F (36.7 C)-99.8 F (37.7 C)] 98 F (36.7 C) (03/09 0453) Pulse Rate:  [68-80] 68 (03/09 0453) Cardiac Rhythm:  [-] Normal sinus rhythm (03/09 0753) Resp:  [18-22] 21 (03/09 0453) BP: (101-160)/(72-106) 132/97 mmHg (03/09 0453) SpO2:  [96 %-99 %] 99 % (03/09 0453) Weight:  [217 lb 2.5 oz (98.5 kg)] 217 lb 2.5 oz (98.5 kg) (03/09 0453)  Intake/Output from previous day: 03/08 0701 - 03/09 0700 In: 1320 [P.O.:1320] Out: 1900 [Urine:1900] Intake/Output this shift: Total I/O In: 240 [P.O.:240] Out: 350 [Urine:350]  General appearance: alert, cooperative and no distress Heart: regular rate and rhythm Lungs: clear to auscultation bilaterally Abdomen: soft, non-tender; bowel sounds normal; no masses,  no organomegaly Extremities: extremities normal, atraumatic, no cyanosis or edema  Lab Results:  Recent Labs  07/20/14 0314 07/21/14 0520  WBC 6.0 6.0  HGB 10.1* 10.2*  HCT 29.7* 30.3*  PLT 119* 144*   BMET:  Recent Labs  07/21/14 0520 07/22/14 0524  NA 137 131*  K 3.7 3.8  CL 105 104  CO2 26 24  GLUCOSE 100* 100*  BUN 11 12  CREATININE 1.32 1.34  CALCIUM 8.5 8.3*    PT/INR: No results for input(s): LABPROT, INR in the last 72 hours. ABG    Component Value Date/Time   TCO2 21 07/16/2014 2324   CBG (last 3)  No results for input(s): GLUCAP in the last 72 hours.  Assessment/Plan:  1. Acute Intramural Aortic Hematoma- stable per CTA chest yesterday continue Labatelol, Clonidine, will start Hydralazine oral due to elevated blood pressure 2. PE identified on CTA- likely chronic, will get LE dopplar today to rule out acute DVT 3. Dispo- patient stable, likely ready for discharge tomorrow if no acute DVT present   LOS: 5 days    Ahmed Prima,  Junie Panning 07/22/2014

## 2014-07-22 NOTE — Progress Notes (Signed)
  Subjective:  Called by nurse about 4:45 pm that patient developed severe back pain similar to when he was admitted. His BP was well-controlled this am at 105/86. When he returned from having his IVC filter placed his SBP was elevated at 151 and 192. Then he developed back pain. No shortness of breath. BP now 165/113.  Objective: Vital signs in last 24 hours: Temp:  [98 F (36.7 C)-99.8 F (37.7 C)] 98.3 F (36.8 C) (03/09 1306) Pulse Rate:  [56-98] 84 (03/09 1700) Cardiac Rhythm:  [-] Normal sinus rhythm (03/09 1557) Resp:  [10-22] 17 (03/09 1557) BP: (102-192)/(78-125) 165/113 mmHg (03/09 1711) SpO2:  [97 %-100 %] 97 % (03/09 1700) Weight:  [98.5 kg (217 lb 2.5 oz)] 98.5 kg (217 lb 2.5 oz) (03/09 0453)  Hemodynamic parameters for last 24 hours:    Intake/Output from previous day: 03/08 0701 - 03/09 0700 In: 1320 [P.O.:1320] Out: 1900 [Urine:1900] Intake/Output this shift: Total I/O In: 380 [P.O.:380] Out: 751 [Urine:750; Stool:1]  General appearance: alert, cooperative and moderate distress Neurologic: intact Heart: regular rate and rhythm, S1, S2 normal, no murmur, click, rub or gallop Lungs: clear to auscultation bilaterally Abdomen: soft, non-tender; bowel sounds normal; no masses,  no organomegaly Extremities: warm and well-perfused. femoral, radial, and pedal pulses strongly palpable.  Lab Results:  Recent Labs  07/20/14 0314 07/21/14 0520  WBC 6.0 6.0  HGB 10.1* 10.2*  HCT 29.7* 30.3*  PLT 119* 144*   BMET:  Recent Labs  07/21/14 0520 07/22/14 0524  NA 137 131*  K 3.7 3.8  CL 105 104  CO2 26 24  GLUCOSE 100* 100*  BUN 11 12  CREATININE 1.32 1.34  CALCIUM 8.5 8.3*    PT/INR: No results for input(s): LABPROT, INR in the last 72 hours. ABG    Component Value Date/Time   TCO2 21 07/16/2014 2324   CBG (last 3)  No results for input(s): GLUCAP in the last 72 hours.  Assessment/Plan:  Severe labile HTN with new onset of back pain. He has  been pain free since the day of admission and CTA of the chest yesterday showed a stable aortic intramural hematoma. He did have a newly diagnosed LLL PE that in retrospect was present on admission and venous duplex this am showed a left femoral vein DVT. Since he can't be anticoagulated an IVC filter was placed. I suspect that his new pain is related to his aorta given the severe HTN prior. Will transfer to ICU and get his BP under control with IV labetalol and hydralazine. Check CXR. I am hesitant to do another CTA today since he had contrast for his CTA yesterday and more contrast for his IVC filter today and has stage II chronic kidney disease.    LOS: 5 days    [name] 07/22/2014

## 2014-07-22 NOTE — H&P (Signed)
Reason for Consult: LLE DVT and unable to have anticoagulation.  Chief Complaint: Chief Complaint  Patient presents with  . Chest Pain    Referring Physician(s): CTS  History of Present Illness: Edward Hopkins is a 64 y.o. male who presented on 07/16/14 with c/o chest pain and found to be hypertensive and imaging revealed a thoracic aortic intramural hematoma, CTS following and is stable per CTA. Imaging also noted stable nonocclusive left lower lobe PE so venous duplex was obtained which showed Left common femoral vein DVT. The patient is unable to be anticoagulated because of the aortic hematoma and IR received request for Retrievable IVC filter. He denies any chest pain currently, he does admit to shortness of breath. He denies any active signs of bleeding or excessive bruising. He denies any recent fever or chills. The patient denies any history of sleep apnea or chronic oxygen use. He has previously tolerated sedation without complications.    Past Medical History  Diagnosis Date  . Osteomyelitis   . Septic arthritis     both hips   . Endocarditis   . CHF (congestive heart failure)   . Hypertension     History reviewed. No pertinent past surgical history.  Allergies: Review of patient's allergies indicates no known allergies.  Medications: Prior to Admission medications   Not on File     History reviewed. No pertinent family history.  History   Social History  . Marital Status: Unknown    Spouse Name: N/A  . Number of Children: N/A  . Years of Education: N/A   Social History Main Topics  . Smoking status: Current Every Day Smoker  . Smokeless tobacco: Not on file  . Alcohol Use: Yes  . Drug Use: Yes    Special: Cocaine, Marijuana  . Sexual Activity: Not on file   Other Topics Concern  . None   Social History Narrative    Review of Systems: A 12 point ROS discussed and pertinent positives are indicated in the HPI above.  All other systems are  negative.  Review of Systems  Vital Signs: BP 102/80 mmHg  Pulse 68  Temp(Src) 98.3 F (36.8 C) (Oral)  Resp 19  Ht 5\' 10"  (1.778 m)  Wt 217 lb 2.5 oz (98.5 kg)  BMI 31.16 kg/m2  SpO2 100%  Physical Exam  Constitutional: He is oriented to person, place, and time. No distress.  HENT:  Head: Normocephalic and atraumatic.  Neck: No tracheal deviation present.  Cardiovascular: Normal rate and regular rhythm.  Exam reveals no gallop and no friction rub.   No murmur heard. Pulmonary/Chest: Effort normal and breath sounds normal. No respiratory distress. He has no wheezes. He has no rales.  Abdominal: Soft. Bowel sounds are normal. He exhibits no distension. There is no tenderness.  Musculoskeletal: He exhibits no edema or tenderness.  Neurological: He is alert and oriented to person, place, and time.  Skin: He is not diaphoretic.    Mallampati Score:  MD Evaluation Airway: WNL Heart: WNL Abdomen: WNL Chest/ Lungs: WNL ASA  Classification: 3 Mallampati/Airway Score: Two  Imaging: Dg Chest 2 View  07/20/2014   CLINICAL DATA:  Shortness of breath.  EXAM: CHEST  2 VIEW  COMPARISON:  07/19/2014.  CT 07/17/2014.  FINDINGS: Mediastinum widening is present consistent with patient's known mediastinal hematoma. Stable cardiomegaly. Normal pulmonary vascularity. Mild bibasilar atelectasis. Small bilateral effusions, left side greater right noted. No pneumothorax. No acute bony abnormality.  IMPRESSION: 1. Stable prominence of the  mediastinum consistent with known mediastinal hematoma. 2. Mild bibasilar atelectasis with small bilateral pleural effusions, left side greater than right.   Electronically Signed   By: Marcello Moores  Register   On: 07/20/2014 07:56   Ct Angio Abdomen W/cm &/or Wo Contrast  07/21/2014   CLINICAL DATA:  h/o Acute Intramural Aortic Hematoma- hemodynamically stable, blood pressure improved with transition to Labatelol, Clonidine, continue Hydralazine prn. Pulm- + dyspnea, CXR  yesterday with atelectasis/small effusions continue IS. Renal- mild bump in creatinine yesterday, but overall remains stable, baseline on admission was 1.47  EXAM: CT ANGIOGRAPHY CHEST AND ABDOMEN  TECHNIQUE: Multidetector CT imaging of the chest and abdomen was performed using the standard protocol during bolus administration of intravenous contrast. Multiplanar CT image reconstructions and MIPs were obtained to evaluate the vascular anatomy.  CONTRAST:  149mL OMNIPAQUE IOHEXOL 350 MG/ML SOLN  COMPARISON:  None.  FINDINGS: CTA CHEST  Right arm injection. SVC patent. RV/LV ratio less than 1, normal. Satisfactory opacification of pulmonary arteries noted. There is a nonocclusive pulmonary embolus in the distal left lower lobe pulmonary artery, extending into the segmental branches, stable. No new pulmonary embolus. Patient breathing degrades some of the images. Patent superior and inferior pulmonary veins bilaterally.  Ectatic ascending aorta 4.1 cm maximum diameter. Bovine variant brachiocephalic arterial origin anatomy without proximal stenosis. Intramural hematoma versus thrombosed dissection involving ascending segment, and arch extending into the distal descending segment, showing partial resolution and decrease in density since prior study. There is no flow external to the true lumen nor any dissection flap visible. Aortic lumen remains widely patent. Minimal atheromatous plaque in the distal arch. No stenosis. Small pericardial effusion. Interval decrease in size of anterior mediastinal hematoma.  Small bilateral pleural effusions have developed. Patchy atelectasis/consolidation posteriorly in both lower lobes and in the lingula.  Spurring in the lower thoracic spine.  Sternum intact.  Review of the MIP images confirms the above findings.  CTA ABDOMEN  Arterial findings:  Aorta: No propagation of the thoracic aortic dissection/intramural hematoma into the abdominal segment. There is some mild eccentric  calcified plaque in the infrarenal segment. No aneurysm or stenosis.  Celiac axis:         Patent  Superior mesenteric: Patency  Left renal:          Single, patent  Right renal:         Single, patent  Inferior mesenteric: Patent  Left iliac: Mild nonocclusive plaque. No aneurysm or stenosis.  Right iliac: Scattered nonocclusive plaque. No aneurysm or stenosis.  Venous findings:     Venous phase imaging not obtained.  Review of the MIP images confirms the above findings.  Nonvascular findings: Unremarkable arterial phase evaluation of liver, nondilated gallbladder, spleen, adrenal glands, pancreas, left kidney. Probable right renal cysts, incompletely characterized. No hydronephrosis. Stomach, small bowel, and colon are nondilated. No ascites. No free air. No adenopathy. Facet DJD in the lower lumbar spine. Advanced bilateral hip degenerative change.  IMPRESSION: 1. Stable thrombosed thoracic aortic dissection versus acute intramural hematoma, without extension into the abdominal aorta or other complicating features since previous exam. 2. Resolving anterior mediastinal hematoma. 3. Stable nonocclusive left lower lobe pulmonary embolus. Critical Value called by telephone at the time of interpretation on 07/21/2014 at 5:55 pm to Dr. Gilford Raid , who verbally acknowledged these results.  4. New bilateral pleural effusions   Electronically Signed   By: Lucrezia Europe M.D.   On: 07/21/2014 17:55   Dg Chest Port 1 View  07/19/2014  CLINICAL DATA:  Acute thoracic aortic dissection and mediastinal hematoma.  EXAM: PORTABLE CHEST - 1 VIEW  COMPARISON:  07/18/2014  FINDINGS: Left pleural effusion appears decreased in size. Decreased atelectasis seen in the left retrocardiac lung base. Right lung remains clear. Cardiomegaly and mediastinal widening appears stable. No pneumothorax visualized.  IMPRESSION: Decreased left pleural effusion and left lower lung atelectasis.  Stable cardiomegaly and mediastinal widening.    Electronically Signed   By: Earle Gell M.D.   On: 07/19/2014 07:35   Dg Chest Port 1 View  07/18/2014   CLINICAL DATA:  Dissecting thoracic aortic and and mediastinal hematoma.  EXAM: PORTABLE CHEST - 1 VIEW  COMPARISON:  07/16/2014  FINDINGS: Thoracic aortic aneurysm again seen. Cardiomegaly is stable. Increased moderate left pleural effusion demonstrated with left lower lung atelectasis. Right lung remains clear.  IMPRESSION: Increased moderate left pleural effusion and left lower lung atelectasis.  Cardiomegaly and thoracic aortic aneurysm again noted.   Electronically Signed   By: Earle Gell M.D.   On: 07/18/2014 10:52   Dg Chest Portable 1 View  07/16/2014   CLINICAL DATA:  Chest pain.  EXAM: PORTABLE CHEST - 1 VIEW  COMPARISON:  None.  FINDINGS: Enlarged aorta, particularly the arch and descending aorta. There is cardiopericardial enlargement as well. Indistinct left diaphragm, likely from the cardiomegaly; no definitive effusion. No failure or pneumonia. No pneumothorax.  Critical Value/emergent results were called by telephone at the time of interpretation on 07/16/2014 at 11:15 pm to Dr. Juleen China, who verbally acknowledged these results.  IMPRESSION: Abnormal aorta, at least aneurysmal. In the setting of chest pain, recommend CTA to evaluate for dissection or mediastinal hematoma.   Electronically Signed   By: Monte Fantasia M.D.   On: 07/16/2014 23:16   Ct Angio Chest Aorta W/cm &/or Wo/cm  07/21/2014   CLINICAL DATA:  h/o Acute Intramural Aortic Hematoma- hemodynamically stable, blood pressure improved with transition to Labatelol, Clonidine, continue Hydralazine prn. Pulm- + dyspnea, CXR yesterday with atelectasis/small effusions continue IS. Renal- mild bump in creatinine yesterday, but overall remains stable, baseline on admission was 1.47  EXAM: CT ANGIOGRAPHY CHEST AND ABDOMEN  TECHNIQUE: Multidetector CT imaging of the chest and abdomen was performed using the standard protocol during  bolus administration of intravenous contrast. Multiplanar CT image reconstructions and MIPs were obtained to evaluate the vascular anatomy.  CONTRAST:  131mL OMNIPAQUE IOHEXOL 350 MG/ML SOLN  COMPARISON:  None.  FINDINGS: CTA CHEST  Right arm injection. SVC patent. RV/LV ratio less than 1, normal. Satisfactory opacification of pulmonary arteries noted. There is a nonocclusive pulmonary embolus in the distal left lower lobe pulmonary artery, extending into the segmental branches, stable. No new pulmonary embolus. Patient breathing degrades some of the images. Patent superior and inferior pulmonary veins bilaterally.  Ectatic ascending aorta 4.1 cm maximum diameter. Bovine variant brachiocephalic arterial origin anatomy without proximal stenosis. Intramural hematoma versus thrombosed dissection involving ascending segment, and arch extending into the distal descending segment, showing partial resolution and decrease in density since prior study. There is no flow external to the true lumen nor any dissection flap visible. Aortic lumen remains widely patent. Minimal atheromatous plaque in the distal arch. No stenosis. Small pericardial effusion. Interval decrease in size of anterior mediastinal hematoma.  Small bilateral pleural effusions have developed. Patchy atelectasis/consolidation posteriorly in both lower lobes and in the lingula.  Spurring in the lower thoracic spine.  Sternum intact.  Review of the MIP images confirms the above findings.  CTA  ABDOMEN  Arterial findings:  Aorta: No propagation of the thoracic aortic dissection/intramural hematoma into the abdominal segment. There is some mild eccentric calcified plaque in the infrarenal segment. No aneurysm or stenosis.  Celiac axis:         Patent  Superior mesenteric: Patency  Left renal:          Single, patent  Right renal:         Single, patent  Inferior mesenteric: Patent  Left iliac: Mild nonocclusive plaque. No aneurysm or stenosis.  Right iliac:  Scattered nonocclusive plaque. No aneurysm or stenosis.  Venous findings:     Venous phase imaging not obtained.  Review of the MIP images confirms the above findings.  Nonvascular findings: Unremarkable arterial phase evaluation of liver, nondilated gallbladder, spleen, adrenal glands, pancreas, left kidney. Probable right renal cysts, incompletely characterized. No hydronephrosis. Stomach, small bowel, and colon are nondilated. No ascites. No free air. No adenopathy. Facet DJD in the lower lumbar spine. Advanced bilateral hip degenerative change.  IMPRESSION: 1. Stable thrombosed thoracic aortic dissection versus acute intramural hematoma, without extension into the abdominal aorta or other complicating features since previous exam. 2. Resolving anterior mediastinal hematoma. 3. Stable nonocclusive left lower lobe pulmonary embolus. Critical Value called by telephone at the time of interpretation on 07/21/2014 at 5:55 pm to Dr. Gilford Raid , who verbally acknowledged these results.  4. New bilateral pleural effusions   Electronically Signed   By: Lucrezia Europe M.D.   On: 07/21/2014 17:55   Ct Angio Chest Aorta W/cm &/or Wo/cm  07/17/2014   CLINICAL DATA:  Acute onset of mid chest pain for 3 hours. Pain when taking deep breaths. Initial encounter.  EXAM: CT ANGIOGRAPHY CHEST, ABDOMEN AND PELVIS  TECHNIQUE: Multidetector CT imaging through the chest, abdomen and pelvis was performed using the standard protocol during bolus administration of intravenous contrast. Multiplanar reconstructed images and MIPs were obtained and reviewed to evaluate the vascular anatomy.  CONTRAST:  172mL OMNIPAQUE IOHEXOL 350 MG/ML SOLN  COMPARISON:  Chest radiograph performed 07/16/2014  FINDINGS: CTA CHEST FINDINGS  There is evidence of dissection and thrombosis along the ascending and descending thoracic aorta on noncontrast images, with associated moderate to large amount of mediastinal hemorrhage. The dominant mediastinal collection  measures 7.9 x 4.9 cm. A small amount of blood is seen tracking into the left pleural space.  On noncontrast images, there is dilatation of the ascending thoracic aorta to 4.7 cm in maximal diameter, and dilatation of the proximal descending thoracic aorta to 4.8 cm in maximal diameter. This includes the underlying thrombosis. The patent lumen of the thoracic aorta measures 4.4 cm along the ascending thoracic aorta, and 3.8 cm along the proximal descending thoracic aorta.  Mild bilateral atelectasis is noted. No pneumothorax is seen. No masses are identified.  There is rightward deviation of the trachea due to the dilatation of the aortic arch and predominantly left-sided mediastinal hemorrhage. Evaluation for mediastinal lymphadenopathy is limited given mediastinal hemorrhage. The great vessels appear grossly intact.  The thyroid gland is unremarkable in appearance. No axillary lymphadenopathy is seen. Mild bilateral gynecomastia is noted.  No acute osseous abnormalities are seen.  Review of the MIP images confirms the above findings.  CTA ABDOMEN AND PELVIS FINDINGS  The abdominal aorta is unremarkable in appearance, aside from mild scattered calcification along the distal abdominal aorta and its branches, extending into both lower extremities. There is no evidence of dissection. The celiac trunk, superior mesenteric artery, bilateral renal arteries  and inferior mesenteric artery appear patent. The IVC is unremarkable in appearance.  Scattered hypodensities are seen within the liver, nonspecific in appearance. The spleen is unremarkable in appearance. The gallbladder is flattened along the diaphragm and difficult to fully assess. The pancreas and adrenal glands are unremarkable.  Scattered right renal cysts are seen, measuring up to 3.9 cm in size. The left kidney is unremarkable in appearance. No significant perinephric stranding is seen. There is no evidence of hydronephrosis. No renal or ureteral stones are  seen.  No free fluid is identified. The small bowel is unremarkable in appearance. The stomach is within normal limits. No acute vascular abnormalities are seen.  The appendix is normal in caliber and contains air, without evidence for appendicitis. Apparent wall thickening along the mid to distal sigmoid colon may simply reflect intraluminal contents, though sigmoidoscopy could be considered for further evaluation, when and as deemed clinically appropriate.  The bladder is mildly distended and grossly unremarkable. The prostate remains normal in size. No inguinal lymphadenopathy is seen.  No acute osseous abnormalities are identified. Degenerative change is noted at the hip joints bilaterally, with diffuse sclerosis and bony remodeling. Facet disease is noted along the lower thoracic and lumbar spine.  Review of the MIP images confirms the above findings.  IMPRESSION: 1. 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 The dominant mediastinal hematoma measures 7.9 x 4.9 cm. Small amount of blood noted tracking into the left pleural space. 2. Dilatation of the ascending thoracic aorta to 4.7 cm in maximal diameter, and dilatation of the descending thoracic aorta to 4.8 cm in maximal diameter. The patent lumen measures 4.4 cm at the ascending thoracic aorta, and 3.8 cm along the proximal descending thoracic aorta. 3. No dissection flap seen more distally. The abdominal aorta is unremarkable in appearance, aside from mild scattered calcification along the distal abdominal aorta and its branches, extending to both lower extremities. 4. Rightward deviation of the trachea reflects dilatation of the aortic arch and left-sided mediastinal hemorrhage. 5. Apparent wall thickening along the mid to distal sigmoid colon may simply reflect intraluminal contents, though sigmoidoscopy could be considered for further  evaluation, when and as deemed clinically appropriate, to exclude mass. 6. Mild bilateral atelectasis noted. 7. Mild bilateral gynecomastia seen. 8. Scattered nonspecific hypodensities in the liver; scattered right renal cysts seen. 9. Degenerative change noted at the hip joints bilaterally, with diffuse sclerosis and bony remodeling.  Critical Value/emergent results were called by telephone at the time of interpretation on 07/17/2014 at 1:31 am to Dr. Maggie Schwalbe , who verbally acknowledged these results.   Electronically Signed   By: Garald Balding M.D.   On: 07/17/2014 01:59   Ct Angio Abd/pel W/ And/or W/o  07/17/2014   CLINICAL DATA:  Acute onset of mid chest pain for 3 hours. Pain when taking deep breaths. Initial encounter.  EXAM: CT ANGIOGRAPHY CHEST, ABDOMEN AND PELVIS  TECHNIQUE: Multidetector CT imaging through the chest, abdomen and pelvis was performed using the standard protocol during bolus administration of intravenous contrast. Multiplanar reconstructed images and MIPs were obtained and reviewed to evaluate the vascular anatomy.  CONTRAST:  16mL OMNIPAQUE IOHEXOL 350 MG/ML SOLN  COMPARISON:  Chest radiograph performed 07/16/2014  FINDINGS: CTA CHEST FINDINGS  There is evidence of dissection and thrombosis along the ascending and descending thoracic aorta on noncontrast images, with associated moderate to large amount of mediastinal hemorrhage. The dominant mediastinal  collection measures 7.9 x 4.9 cm. A small amount of blood is seen tracking into the left pleural space.  On noncontrast images, there is dilatation of the ascending thoracic aorta to 4.7 cm in maximal diameter, and dilatation of the proximal descending thoracic aorta to 4.8 cm in maximal diameter. This includes the underlying thrombosis. The patent lumen of the thoracic aorta measures 4.4 cm along the ascending thoracic aorta, and 3.8 cm along the proximal descending thoracic aorta.  Mild bilateral atelectasis is noted. No  pneumothorax is seen. No masses are identified.  There is rightward deviation of the trachea due to the dilatation of the aortic arch and predominantly left-sided mediastinal hemorrhage. Evaluation for mediastinal lymphadenopathy is limited given mediastinal hemorrhage. The great vessels appear grossly intact.  The thyroid gland is unremarkable in appearance. No axillary lymphadenopathy is seen. Mild bilateral gynecomastia is noted.  No acute osseous abnormalities are seen.  Review of the MIP images confirms the above findings.  CTA ABDOMEN AND PELVIS FINDINGS  The abdominal aorta is unremarkable in appearance, aside from mild scattered calcification along the distal abdominal aorta and its branches, extending into both lower extremities. There is no evidence of dissection. The celiac trunk, superior mesenteric artery, bilateral renal arteries and inferior mesenteric artery appear patent. The IVC is unremarkable in appearance.  Scattered hypodensities are seen within the liver, nonspecific in appearance. The spleen is unremarkable in appearance. The gallbladder is flattened along the diaphragm and difficult to fully assess. The pancreas and adrenal glands are unremarkable.  Scattered right renal cysts are seen, measuring up to 3.9 cm in size. The left kidney is unremarkable in appearance. No significant perinephric stranding is seen. There is no evidence of hydronephrosis. No renal or ureteral stones are seen.  No free fluid is identified. The small bowel is unremarkable in appearance. The stomach is within normal limits. No acute vascular abnormalities are seen.  The appendix is normal in caliber and contains air, without evidence for appendicitis. Apparent wall thickening along the mid to distal sigmoid colon may simply reflect intraluminal contents, though sigmoidoscopy could be considered for further evaluation, when and as deemed clinically appropriate.  The bladder is mildly distended and grossly unremarkable.  The prostate remains normal in size. No inguinal lymphadenopathy is seen.  No acute osseous abnormalities are identified. Degenerative change is noted at the hip joints bilaterally, with diffuse sclerosis and bony remodeling. Facet disease is noted along the lower thoracic and lumbar spine.  Review of the MIP images confirms the above findings.  IMPRESSION: 1. 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 The dominant mediastinal hematoma measures 7.9 x 4.9 cm. Small amount of blood noted tracking into the left pleural space. 2. Dilatation of the ascending thoracic aorta to 4.7 cm in maximal diameter, and dilatation of the descending thoracic aorta to 4.8 cm in maximal diameter. The patent lumen measures 4.4 cm at the ascending thoracic aorta, and 3.8 cm along the proximal descending thoracic aorta. 3. No dissection flap seen more distally. The abdominal aorta is unremarkable in appearance, aside from mild scattered calcification along the distal abdominal aorta and its branches, extending to both lower extremities. 4. Rightward deviation of the trachea reflects dilatation of the aortic arch and left-sided mediastinal hemorrhage. 5. Apparent wall thickening along the mid to distal sigmoid colon may simply reflect intraluminal contents, though sigmoidoscopy could be considered for further evaluation, when and as deemed clinically  appropriate, to exclude mass. 6. Mild bilateral atelectasis noted. 7. Mild bilateral gynecomastia seen. 8. Scattered nonspecific hypodensities in the liver; scattered right renal cysts seen. 9. Degenerative change noted at the hip joints bilaterally, with diffuse sclerosis and bony remodeling.  Critical Value/emergent results were called by telephone at the time of interpretation on 07/17/2014 at 1:31 am to Dr. Maggie Schwalbe , who verbally acknowledged these results.    Electronically Signed   By: Garald Balding M.D.   On: 07/17/2014 01:59    Labs:  CBC:  Recent Labs  07/18/14 0909 07/19/14 0320 07/20/14 0314 07/21/14 0520  WBC 6.4 6.5 6.0 6.0  HGB 10.4* 10.6* 10.1* 10.2*  HCT 31.4* 32.0* 29.7* 30.3*  PLT 112* 115* 119* 144*    COAGS:  Recent Labs  07/17/14 0156  INR 1.15    BMP:  Recent Labs  07/19/14 0320 07/20/14 0314 07/21/14 0520 07/22/14 0524  NA 137 135 137 131*  K 3.8 3.8 3.7 3.8  CL 107 106 105 104  CO2 22 24 26 24   GLUCOSE 130* 107* 100* 100*  BUN 11 10 11 12   CALCIUM 8.5 8.2* 8.5 8.3*  CREATININE 1.37* 1.22 1.32 1.34  GFRNONAA 52* 60* 55* 54*  GFRAA 61* 70* 63* 62*    LIVER FUNCTION TESTS:  Recent Labs  07/16/14 2259 07/18/14 0248  BILITOT 0.6 1.0  AST 36 22  ALT 30 23  ALKPHOS 72 53  PROT 7.1 5.7*  ALBUMIN 3.4* 2.7*    Assessment and Plan: Thoracic aortic intramural hematoma, CTS following and stable per CTA Left common femoral vein DVT, unable to be anticoagulated secondary to above History of PE previously on coumadin Request for Retrievable IVC filter  Patient's labs reviewed Risks and Benefits discussed with the patient including, but not limited to bleeding, infection, contrast induced renal failure, filter fracture or migration which can lead to emergency surgery or even death, strut penetration with damage or irritation to adjacent structures and caval thrombosis. The patient was encouraged to have the filter removed after his DVT resolved to prevent further damage.  All of the patient's questions were answered, patient is agreeable to proceed. Consent signed and in chart.    Thank you for this interesting consult.  I greatly enjoyed meeting Edward Hopkins and look forward to participating in their care.  SignedHedy Jacob 07/22/2014, 2:11 PM   I spent a total of 20 Minutes in face to face in clinical consultation, greater than 50% of which was counseling/coordinating care for LLE  DVT and unable to have anticoagulation.

## 2014-07-22 NOTE — Progress Notes (Signed)
Physical Therapy Treatment Patient Details Name: Theodoric Teet MRN: [id number] DOB: 1951/04/03 Today's Date: 07/22/2014    History of Present Illness 64 y.o. male admitted with Thoracic aortic intramural hematoma    PT Comments    Patient with high energy requirement for gait and risk for lower back pain due to gains LE mobility utilizing motion at lumbopelvic junction.  Will likely need bilateral hip replacements to allow more nomalized gait pattern.  Achieved goal for stair negotiation and basically for gait.  Needs specific written HEP to complete goal achievement.  Follow Up Recommendations  Home health PT     Equipment Recommendations  None recommended by PT    Recommendations for Other Services       Precautions / Restrictions Precautions Precautions: None    Mobility  Bed Mobility Overal bed mobility: Modified Independent                Transfers Overall transfer level: Modified independent               General transfer comment: scoots way out to edge of bed, UE support to stand on right LE, keeps left extended and has to stand from posterior lean due to bilateral hip ROM limitations  Ambulation/Gait Ambulation/Gait assistance: Supervision Ambulation Distance (Feet): 600 Feet Assistive device: Straight cane Gait Pattern/deviations: Step-through pattern;Decreased stride length;Wide base of support;Shuffle     General Gait Details: increased pelvic rotation for LE progression due to limited AROM in hips; positive heel strike, but due to decreased step length has no progression of weight to anterior foot so relies on trunk musculature to initiate swing.  Asked pt to step over box in hallway, stated unable, would have to step on it to get over it.   Stairs Stairs: Yes Stairs assistance: Supervision Stair Management: One rail Right;With cane;Step to pattern;Forwards Number of Stairs: 10 General stair comments: leads up with right, down with  left  Wheelchair Mobility    Modified Rankin (Stroke Patients Only)       Balance             Standing balance-Leahy Scale: Fair                      Cognition Arousal/Alertness: Awake/alert Behavior During Therapy: WFL for tasks assessed/performed Overall Cognitive Status: Within Functional Limits for tasks assessed                      Exercises General Exercises - Lower Extremity Heel Slides: AAROM;Both;5 reps;Supine Heel Raises: AROM;Both;10 reps;Standing    General Comments        Pertinent Vitals/Pain Pain Score: 0-No pain Pain Location: denies pain, but reports stiffness    Home Living                      Prior Function            PT Goals (current goals can now be found in the care plan section) Progress towards PT goals: Progressing toward goals    Frequency  Min 3X/week    PT Plan Current plan remains appropriate    Co-evaluation             End of Session Equipment Utilized During Treatment: Gait belt Activity Tolerance: Patient tolerated treatment well Patient left: in bed;with call bell/phone within reach     Time: [id number] PT Time Calculation (min) (ACUTE ONLY): 27 min  Charges:  $Gait Training: 8-22 mins $  Therapeutic Exercise: 8-22 mins                    G Codes:      WYNN,CYNDI 07-24-2014, 10:22 AM  Magda Kiel, PT [phone] July 24, 2014

## 2014-07-22 NOTE — Sedation Documentation (Signed)
Pt ate salad at lunch today, MD aware, will use fentanyl only for sedation. Pt aware and agreeable.

## 2014-07-22 NOTE — Progress Notes (Signed)
*  PRELIMINARY RESULTS* Vascular Ultrasound Lower extremity venous duplex  has been completed.  Preliminary findings: DVT noted in the Left common femoral vein. No DVT RLE.   Landry Mellow, RDMS, RVT  07/22/2014, 11:12 AM

## 2014-07-22 NOTE — Progress Notes (Signed)
Patient's fiance notified of the situation and informed of patient's room change. Afleming, RN

## 2014-07-22 NOTE — Significant Event (Signed)
Rapid Response Event Note  Overview:  Called to assist with patient with CP post IVC insertion Time Called: 1630 Arrival Time: 1635 Event Type: Other (Comment)  Initial Focused Assessment:  On arrival patient supine in bed - skin warm and moist - alert - oriented - very restless - resps rapid - bil BS present - no wheezing - distant in bases - denies SOB - heart sounds without murmur or rub noted - no JVD - abd soft denies nausea or SOB - just states it hurts "like it did in the ED".  Peripheral pulses present .  BP elevated 189/117 HR 68 SR - RR 28 O2 sats 98% on 2 liters.     Interventions:  Stat call for PCXR - patient repositioned in bed - needs BP control - RN Angie has given po med - no prn IV meds ordered - Dr Cyndia Bent to bedside.  Apresoline 20 mg IV given by Janace Hoard RN.  BP 169/118 HR 71 - patient continue to c/o pain - 1715:  192/125 - Morphine 4 mg IV given stat per order MD.  Colin Ina done.  1730:  151/105 HR 73 - patient less restless - still c/o some pain.  Transferred to 2S16 - new right hand PIV inserted #20 x one attempt - to NS lock.  BP goal per Dr. Cyndia Bent is 110/. Order for IV push Labetalol until drip obtained - given by RN on 2S.    Handoff to [employer] - questions answered.  Patient resting better - still with some pain - BP 118/78.     Event Summary: Name of Physician Notified: Dr. Cyndia Bent at 1630    at    Outcome: Transferred (Comment) [phone])  Event End Time: [place]  Quin Hoop

## 2014-07-23 LAB — BASIC METABOLIC PANEL
Anion gap: 6 (ref 5–15)
BUN: 12 mg/dL (ref 6–23)
CHLORIDE: 105 mmol/L (ref 96–112)
CO2: 23 mmol/L (ref 19–32)
CREATININE: 1.46 mg/dL — AB (ref 0.50–1.35)
Calcium: 8.6 mg/dL (ref 8.4–10.5)
GFR calc Af Amer: 56 mL/min — ABNORMAL LOW (ref >90)
GFR calc non Af Amer: 48 mL/min — ABNORMAL LOW (ref >90)
GLUCOSE: 100 mg/dL — AB (ref 70–99)
Potassium: 4.2 mmol/L (ref 3.5–5.1)
Sodium: 134 mmol/L — ABNORMAL LOW (ref 135–145)

## 2014-07-23 LAB — CBC
HCT: 29 % — ABNORMAL LOW (ref 39.0–52.0)
Hemoglobin: 9.9 g/dL — ABNORMAL LOW (ref 13.0–17.0)
MCH: 29 pg (ref 26.0–34.0)
MCHC: 34.1 g/dL (ref 30.0–36.0)
MCV: 85 fL (ref 78.0–100.0)
Platelets: 164 10*3/uL (ref 150–400)
RBC: 3.41 MIL/uL — ABNORMAL LOW (ref 4.22–5.81)
RDW: 13.2 % (ref 11.5–15.5)
WBC: 6.3 10*3/uL (ref 4.0–10.5)

## 2014-07-23 MED ORDER — LABETALOL HCL 5 MG/ML IV SOLN
0.5000 mg/min | INTRAVENOUS | Status: DC
  Administered 2014-07-23: 1 mg/min via INTRAVENOUS
  Administered 2014-07-23: 2 mg/min via INTRAVENOUS
  Administered 2014-07-24: 3 mg/min via INTRAVENOUS
  Filled 2014-07-23 (×5): qty 200

## 2014-07-23 NOTE — Progress Notes (Signed)
Pt has much difficulty transferring from chair to bed d/t hip problems, not steady on feet.

## 2014-07-23 NOTE — Plan of Care (Signed)
Glasses found in pt's sheets.

## 2014-07-23 NOTE — Plan of Care (Signed)
2W called re: pt missing glasses. They state they transferred them with patient. Unable to locate in room.

## 2014-07-23 NOTE — Plan of Care (Signed)
Problem: Problem: Cardiovascular Progression Goal: HEMODYNAMIC STABILITY Outcome: Progressing Requiring labetolol drip for BP management

## 2014-07-23 NOTE — Progress Notes (Signed)
CT surgery p.m. Rounds  Patient sitting up in chair comfortable visiting with family No pain complaints Blood pressure well controlled on IV labetalol drip Will  transition to oral dosing tomorrow Continue current care for thoracic aortic intramural hematoma

## 2014-07-24 DIAGNOSIS — R0602 Shortness of breath: Secondary | ICD-10-CM

## 2014-07-24 DIAGNOSIS — I7100 Dissection of unspecified site of aorta: Secondary | ICD-10-CM

## 2014-07-24 DIAGNOSIS — I10 Essential (primary) hypertension: Secondary | ICD-10-CM

## 2014-07-24 MED ORDER — HALOPERIDOL LACTATE 5 MG/ML IJ SOLN
2.0000 mg | Freq: Once | INTRAMUSCULAR | Status: AC
  Administered 2014-07-24: 2 mg via INTRAVENOUS
  Filled 2014-07-24: qty 1

## 2014-07-24 MED ORDER — LABETALOL HCL 300 MG PO TABS
300.0000 mg | ORAL_TABLET | Freq: Three times a day (TID) | ORAL | Status: DC
  Administered 2014-07-24 (×2): 300 mg via ORAL
  Filled 2014-07-24 (×6): qty 1

## 2014-07-24 MED ORDER — FUROSEMIDE 10 MG/ML IJ SOLN
40.0000 mg | Freq: Once | INTRAMUSCULAR | Status: AC
  Administered 2014-07-24: 40 mg via INTRAVENOUS

## 2014-07-24 MED ORDER — LABETALOL HCL 200 MG PO TABS
200.0000 mg | ORAL_TABLET | Freq: Three times a day (TID) | ORAL | Status: DC
  Filled 2014-07-24 (×3): qty 1

## 2014-07-24 MED ORDER — LEVALBUTEROL HCL 0.63 MG/3ML IN NEBU
0.6300 mg | INHALATION_SOLUTION | Freq: Four times a day (QID) | RESPIRATORY_TRACT | Status: DC
  Administered 2014-07-24: 0.63 mg via RESPIRATORY_TRACT
  Filled 2014-07-24: qty 3

## 2014-07-24 MED ORDER — LEVALBUTEROL HCL 0.63 MG/3ML IN NEBU
0.6300 mg | INHALATION_SOLUTION | Freq: Three times a day (TID) | RESPIRATORY_TRACT | Status: DC
  Administered 2014-07-25: 0.63 mg via RESPIRATORY_TRACT
  Filled 2014-07-24 (×4): qty 3

## 2014-07-24 MED ORDER — HALOPERIDOL LACTATE 5 MG/ML IJ SOLN
1.0000 mg | Freq: Four times a day (QID) | INTRAMUSCULAR | Status: DC | PRN
  Filled 2014-07-24: qty 0.2

## 2014-07-24 NOTE — Progress Notes (Addendum)
Physical Therapy Treatment Patient Details Name: Edward Hopkins MRN: [id number] DOB: 1950/10/30 Today's Date: 07/24/2014    History of Present Illness 64 y.o. male admitted with Thoracic aortic intramural hematoma. S/p IVC filter placement 3/09.    PT Comments    Patient with slow progress due today limited by lethargy and drop in BP with symptomatic features upon standing. He did tolerate therapeutic exercises well and we continue to encourage him to perform these several times/day. Stability seems to greatly improve with the use of a rolling walker today, used to transfer to chair. BP initially 104/74 in supine, down to 84/62 after standing. Patient will continue to benefit from skilled physical therapy services to further improve independence with functional mobility.   Follow Up Recommendations  Home health PT     Equipment Recommendations  Rolling walker with 5" wheels    Recommendations for Other Services OT consult     Precautions / Restrictions Precautions Precautions: Fall Restrictions Weight Bearing Restrictions: No    Mobility  Bed Mobility Overal bed mobility: Modified Independent             General bed mobility comments: Slow and difficult to perform due to limited hip ROM  Transfers Overall transfer level: Needs assistance Equipment used: Rolling walker (2 wheeled) Transfers: Sit to/from Stand Sit to Stand: Min guard         General transfer comment: Close guard for safety. VC for LE and hand placement prior to standing. Difficulty flexing hips. Moderately unstable but greatly improved once holding a rolling walker for supprt.  Ambulation/Gait Ambulation/Gait assistance: Min assist Ambulation Distance (Feet): 5 Feet Assistive device: Rolling walker (2 wheeled) Gait Pattern/deviations: Step-through pattern;Decreased stride length;Shuffle;Antalgic;Trunk flexed;Narrow base of support Gait velocity: decreased   General Gait Details: Pt felt very  lightheaded and nauseous upon standing. Became diaphoretic and BP dropped to 84/62 (from 104/74). Distance limited due to these signs/symptoms. Able to take several steps to chair requiring min assist for walker control. Stability seems greatly improved with use of this device compared to [facility].   Stairs            Wheelchair Mobility    Modified Rankin (Stroke Patients Only)       Balance                                    Cognition Arousal/Alertness: Lethargic;Suspect due to medications Behavior During Therapy: Flat affect Overall Cognitive Status: Impaired/Different from baseline Area of Impairment: Orientation;Following commands;Problem solving Orientation Level: Disoriented to;Situation     Following Commands: Follows one step commands with increased time     Problem Solving: Slow processing;Difficulty sequencing;Requires verbal cues General Comments: Very lethargic. Asked PT if people are painting his room. Re-oriented and more alert at end of therapy session.    Exercises General Exercises - Lower Extremity Ankle Circles/Pumps: AROM;Both;10 reps;Supine Quad Sets: Strengthening;Both;10 reps;Supine Short Arc Quad: Strengthening;Both;10 reps;Supine Heel Slides: Strengthening;Both;10 reps;Supine;AAROM Hip ABduction/ADduction: Strengthening;AAROM;Both;10 reps;Supine    General Comments        Pertinent Vitals/Pain Pain Assessment: 0-10 Pain Score: 0-No pain (at rest) Pain Location: Grimaces when moving Pain Descriptors / Indicators: Grimacing Pain Intervention(s): Limited activity within patient's tolerance;Monitored during session;Repositioned  HR 61, SpO2 97% on room air BP at end of there therapy in seated position 88/67. RN & MD aware.    Home Living  Prior Function            PT Goals (current goals can now be found in the care plan section) Acute Rehab PT Goals Patient Stated Goal: go home PT Goal  Formulation: With patient Time For Goal Achievement: 08/02/14 Potential to Achieve Goals: Good Progress towards PT goals: Progressing toward goals    Frequency  Min 3X/week    PT Plan Frequency needs to be updated    Co-evaluation             End of Session Equipment Utilized During Treatment: Gait belt Activity Tolerance: Treatment limited secondary to medical complications (Comment) (limited ambulation due to drop in BP and pt symptomatic) Patient left: with call bell/phone within reach;in chair;with nursing/sitter in room     Time: [id number] PT Time Calculation (min) (ACUTE ONLY): 32 min  Charges:  $Therapeutic Exercise: 8-22 mins $Therapeutic Activity: 8-22 mins                    G Codes:      Ellouise Newer 27-Jul-2014, 9:59 AM Elayne Snare, Julian  Addendum for equipment recommendation

## 2014-07-24 NOTE — Plan of Care (Signed)
Patient acutely agitated, trying to get up, fighting staff, pulling out lines, Dr. Roxan Hockey here, Haldol 2 mg IV given per order.

## 2014-07-24 NOTE — Plan of Care (Signed)
Patient trying to get up again, angry over restraining, DD in to talk with patient.

## 2014-07-24 NOTE — Progress Notes (Signed)
  Subjective:  No complaints  Objective: Vital signs in last 24 hours: Temp:  [97.9 F (36.6 C)-99 F (37.2 C)] 99 F (37.2 C) (03/11 0710) Pulse Rate:  [25-161] 68 (03/11 0915) Cardiac Rhythm:  [-] Normal sinus rhythm (03/11 0746) Resp:  [14-34] 23 (03/11 0915) BP: (80-146)/(45-118) 88/67 mmHg (03/11 0915) SpO2:  [92 %-100 %] 96 % (03/11 0915) Weight:  [95.3 kg (210 lb 1.6 oz)] 95.3 kg (210 lb 1.6 oz) (03/11 0500)  Hemodynamic parameters for last 24 hours:    Intake/Output from previous day: 03/10 0701 - 03/11 0700 In: 1452.5 [P.O.:540; I.V.:912.5] Out: 1350 [Urine:1350] Intake/Output this shift: Total I/O In: 155 [P.O.:60; I.V.:95] Out: -   General appearance: lethargic Neurologic: non-focal Heart: regular rate and rhythm, S1, S2 normal, no murmur, click, rub or gallop Lungs: diminished breath sounds bibasilar Extremities: extremities normal, atraumatic, no cyanosis or edema Radial, femoral, dp and dp pulses all strongly palpable  Lab Results:  Recent Labs  07/23/14 0517  WBC 6.3  HGB 9.9*  HCT 29.0*  PLT 164   BMET:  Recent Labs  07/22/14 0524 07/23/14 0517  NA 131* 134*  K 3.8 4.2  CL 104 105  CO2 24 23  GLUCOSE 100* 100*  BUN 12 12  CREATININE 1.34 1.46*  CALCIUM 8.3* 8.6    PT/INR: No results for input(s): LABPROT, INR in the last 72 hours. ABG    Component Value Date/Time   TCO2 21 07/16/2014 2324   CBG (last 3)  No results for input(s): GLUCAP in the last 72 hours.  Assessment/Plan:  His blood pressure is under good control on IV labetalol at 2.5 mg/min. Will start oral labetalol at 300 every 8 hrs and wean off drip. Continue clonidine. He is lethargic and doesn't want to move or eat. Will stop Ativan at hs.  His mobility is slowed by bilateral hip arthritis.   LOS: 7 days    [name] 07/24/2014

## 2014-07-24 NOTE — Progress Notes (Signed)
Patient ID: Edward Hopkins, male   DOB: 1950-11-18, 64 y.o.   MRN: [id number] EVENING ROUNDS NOTE :     [address]       [place] [address]             [phone]                      Total Length of Stay:  LOS: 7 days  BP 110/66 mmHg  Pulse 37  Temp(Src) 98.1 F (36.7 C) (Oral)  Resp 23  Ht 5\' 10"  (1.778 m)  Wt 210 lb 1.6 oz (95.3 kg)  BMI 30.15 kg/m2  SpO2 96%  .Intake/Output      03/10 0701 - 03/11 0700 03/11 0701 - 03/12 0700   P.O. 540 600   I.V. (mL/kg) 912.5 (9.6) 152.5 (1.6)   Total Intake(mL/kg) 1452.5 (15.2) 752.5 (7.9)   Urine (mL/kg/hr) 1350 (0.6) 575 (0.5)   Stool     Total Output 1350 575   Net +102.5 +177.5        Urine Occurrence 1 x      . labetalol (NORMODYNE) infusion Stopped (07/24/14 1137)     Lab Results  Component Value Date   WBC 6.3 07/23/2014   HGB 9.9* 07/23/2014   HCT 29.0* 07/23/2014   PLT 164 07/23/2014   GLUCOSE 100* 07/23/2014   ALT 23 07/18/2014   AST 22 07/18/2014   NA 134* 07/23/2014   K 4.2 07/23/2014   CL 105 07/23/2014   CREATININE 1.46* 07/23/2014   BUN 12 07/23/2014   CO2 23 07/23/2014   INR 1.15 07/17/2014   Became very combative this afternoon, Seen by Dr Roxan Hockey and given iv haldol Now calmer , but wheezing some Mild diuresis Grace Isaac MD  Beeper [phone] Office [phone] 07/24/2014 6:22 PM

## 2014-07-24 NOTE — Plan of Care (Signed)
Pt trying to get up by himself, told to call RN for assistance. Will monitor closely. Inappropriate comments at times.

## 2014-07-24 NOTE — Plan of Care (Signed)
Patient calmer, will monitor closely.

## 2014-07-25 ENCOUNTER — Inpatient Hospital Stay (HOSPITAL_COMMUNITY): Payer: Medicare Other

## 2014-07-25 LAB — AMMONIA: Ammonia: 11 umol/L (ref 11–32)

## 2014-07-25 MED ORDER — LABETALOL HCL 200 MG PO TABS
200.0000 mg | ORAL_TABLET | Freq: Three times a day (TID) | ORAL | Status: DC
  Administered 2014-07-25 – 2014-07-28 (×9): 200 mg via ORAL
  Filled 2014-07-25 (×12): qty 1

## 2014-07-25 MED ORDER — LEVALBUTEROL HCL 0.63 MG/3ML IN NEBU: 0.6300 mg | INHALATION_SOLUTION | Freq: Four times a day (QID) | RESPIRATORY_TRACT | Status: DC | PRN

## 2014-07-25 NOTE — Progress Notes (Signed)
Patient ID: Edward Hopkins, male   DOB: 1951/03/17, 64 y.o.   MRN: [id number] TCTS DAILY ICU PROGRESS NOTE                   [address]            [place] [address]          [phone]        Total Length of Stay:  LOS: 8 days   Subjective: Mental status much improved, talking and alert this am  Objective: Vital signs in last 24 hours: Temp:  [98.1 F (36.7 C)-98.6 F (37 C)] 98.4 F (36.9 C) (03/12 0709) Pulse Rate:  [53-155] 63 (03/12 0800) Cardiac Rhythm:  [-] Normal sinus rhythm (03/12 0800) Resp:  [15-27] 20 (03/12 0800) BP: (83-117)/(47-91) 98/60 mmHg (03/12 0800) SpO2:  [93 %-100 %] 99 % (03/12 0814) Weight:  [207 lb (93.895 kg)] 207 lb (93.895 kg) (03/12 0500)  Filed Weights   07/23/14 0500 07/24/14 0500 07/25/14 0500  Weight: 218 lb 7.6 oz (99.1 kg) 210 lb 1.6 oz (95.3 kg) 207 lb (93.895 kg)    Weight change: -3 lb 1.6 oz (-1.405 kg)   Hemodynamic parameters for last 24 hours:    Intake/Output from previous day: 03/11 0701 - 03/12 0700 In: 892.5 [P.O.:720; I.V.:172.5] Out: 2125 [Urine:2125]  Intake/Output this shift: Total I/O In: 240 [P.O.:240] Out: -   Current Meds: Scheduled Meds: . antiseptic oral rinse  7 mL Mouth Rinse BID  . cloNIDine  0.1 mg Oral BID  . famotidine  20 mg Oral BID  . folic acid  1 mg Oral Daily  . labetalol  300 mg Oral TID  . levalbuterol  0.63 mg Nebulization TID  . multivitamin with minerals  1 tablet Oral Daily  . thiamine  100 mg Oral Daily   Continuous Infusions: . labetalol (NORMODYNE) infusion Stopped (07/24/14 1137)   PRN Meds:.sodium chloride, haloperidol lactate, hydrALAZINE, HYDROcodone-acetaminophen, ondansetron (ZOFRAN) IV  General appearance: alert and cooperative Neurologic: intact Heart: regular rate and rhythm, S1, S2 normal, no murmur, click, rub or gallop Lungs: diminished breath sounds bibasilar Abdomen: soft, non-tender; bowel sounds normal; no masses,  no  organomegaly Extremities: extremities normal, atraumatic, no cyanosis or edema and Homans sign is negative, no sign of DVT  Lab Results: CBC: Recent Labs  07/23/14 0517  WBC 6.3  HGB 9.9*  HCT 29.0*  PLT 164   BMET:  Recent Labs  07/23/14 0517  NA 134*  K 4.2  CL 105  CO2 23  GLUCOSE 100*  BUN 12  CREATININE 1.46*  CALCIUM 8.6    PT/INR: No results for input(s): LABPROT, INR in the last 72 hours. Radiology: No results found.   Assessment/Plan: Mobilize BP low now will decrease labatalo dose and add hold levels   Renal function stable    Grace Isaac 07/25/2014 9:38 AM

## 2014-07-25 NOTE — Progress Notes (Signed)
Patient ID: Edward Hopkins, male   DOB: 12-11-1950, 64 y.o.   MRN: [id number] EVENING ROUNDS NOTE :     [address]       [place] [address]             [phone]                      Total Length of Stay:  LOS: 8 days  BP 104/73 mmHg  Pulse 82  Temp(Src) 99.8 F (37.7 C) (Oral)  Resp 15  Ht 5\' 10"  (1.778 m)  Wt 207 lb (93.895 kg)  BMI 29.70 kg/m2  SpO2 96%  .Intake/Output      03/11 0701 - 03/12 0700 03/12 0701 - 03/13 0700   P.O. 720 480   I.V. (mL/kg) 172.5 (1.8)    Total Intake(mL/kg) 892.5 (9.5) 480 (5.1)   Urine (mL/kg/hr) 2125 (0.9) 800 (0.8)   Total Output 2125 800   Net -1232.5 -320          . labetalol (NORMODYNE) infusion Stopped (07/24/14 1137)     Lab Results  Component Value Date   WBC 6.3 07/23/2014   HGB 9.9* 07/23/2014   HCT 29.0* 07/23/2014   PLT 164 07/23/2014   GLUCOSE 100* 07/23/2014   ALT 23 07/18/2014   AST 22 07/18/2014   NA 134* 07/23/2014   K 4.2 07/23/2014   CL 105 07/23/2014   CREATININE 1.46* 07/23/2014   BUN 12 07/23/2014   CO2 23 07/23/2014   INR 1.15 07/17/2014   Stable day Mental status much improved, now conversant  Grace Isaac MD  Beeper [phone] Office [phone] 07/25/2014 6:07 PM

## 2014-07-26 LAB — BASIC METABOLIC PANEL
Anion gap: 4 — ABNORMAL LOW (ref 5–15)
BUN: 14 mg/dL (ref 6–23)
CO2: 25 mmol/L (ref 19–32)
Calcium: 8.5 mg/dL (ref 8.4–10.5)
Chloride: 106 mmol/L (ref 96–112)
Creatinine, Ser: 1.64 mg/dL — ABNORMAL HIGH (ref 0.50–1.35)
GFR calc Af Amer: 49 mL/min — ABNORMAL LOW (ref >90)
GFR calc non Af Amer: 42 mL/min — ABNORMAL LOW (ref >90)
Glucose, Bld: 105 mg/dL — ABNORMAL HIGH (ref 70–99)
Potassium: 4.3 mmol/L (ref 3.5–5.1)
Sodium: 135 mmol/L (ref 135–145)

## 2014-07-26 LAB — CBC
HCT: 31.7 % — ABNORMAL LOW (ref 39.0–52.0)
Hemoglobin: 10.6 g/dL — ABNORMAL LOW (ref 13.0–17.0)
MCH: 28.9 pg (ref 26.0–34.0)
MCHC: 33.4 g/dL (ref 30.0–36.0)
MCV: 86.4 fL (ref 78.0–100.0)
Platelets: 235 10*3/uL (ref 150–400)
RBC: 3.67 MIL/uL — ABNORMAL LOW (ref 4.22–5.81)
RDW: 13.4 % (ref 11.5–15.5)
WBC: 7.2 10*3/uL (ref 4.0–10.5)

## 2014-07-26 NOTE — Progress Notes (Signed)
Report given to 2W RN, Roselyn Reef. Pt transferred to 2W-21 via wheelchair. Meds in chart, belongings at bedside. Family aware of room change. VS stable at time of transfer. BP slightly elevated (122/70). Scheduled labatelol given. No questions or complaints at this time. Bedside handoff to 2W RN, Roselyn Reef.  Harrison Paulson L

## 2014-07-26 NOTE — Progress Notes (Signed)
Patient ID: Edward Hopkins, male   DOB: Jul 25, 1950, 64 y.o.   MRN: [id number] TCTS DAILY ICU PROGRESS NOTE                   [address]            [place] [address]          [phone]        Total Length of Stay:  LOS: 9 days   Subjective: Alert and talkative today   Objective: Vital signs in last 24 hours: Temp:  [98.8 F (37.1 C)-99.9 F (37.7 C)] 99.8 F (37.7 C) (03/13 0716) Pulse Rate:  [58-94] 94 (03/13 0900) Cardiac Rhythm:  [-] Normal sinus rhythm (03/13 0800) Resp:  [0-25] 20 (03/13 1100) BP: (84-137)/(35-93) 103/78 mmHg (03/13 1100) SpO2:  [93 %-100 %] 100 % (03/13 0900) Weight:  [203 lb 0.7 oz (92.1 kg)] 203 lb 0.7 oz (92.1 kg) (03/13 0600)  Filed Weights   07/24/14 0500 07/25/14 0500 07/26/14 0600  Weight: 210 lb 1.6 oz (95.3 kg) 207 lb (93.895 kg) 203 lb 0.7 oz (92.1 kg)    Weight change: -3 lb 15.3 oz (-1.795 kg)   Hemodynamic parameters for last 24 hours:    Intake/Output from previous day: 03/12 0701 - 03/13 0700 In: 840 [P.O.:840] Out: 1450 [Urine:1450]  Intake/Output this shift: Total I/O In: 240 [P.O.:240] Out: 300 [Urine:300]  Current Meds: Scheduled Meds: . antiseptic oral rinse  7 mL Mouth Rinse BID  . cloNIDine  0.1 mg Oral BID  . famotidine  20 mg Oral BID  . folic acid  1 mg Oral Daily  . labetalol  200 mg Oral TID  . multivitamin with minerals  1 tablet Oral Daily  . thiamine  100 mg Oral Daily   Continuous Infusions: . labetalol (NORMODYNE) infusion Stopped (07/24/14 1137)   PRN Meds:.sodium chloride, haloperidol lactate, hydrALAZINE, HYDROcodone-acetaminophen, levalbuterol, ondansetron (ZOFRAN) IV  General appearance: alert and cooperative Neurologic: intact Heart: regular rate and rhythm, S1, S2 normal, no murmur, click, rub or gallop Lungs: clear to auscultation bilaterally Abdomen: soft, non-tender; bowel sounds normal; no masses,  no organomegaly Extremities: extremities normal, atraumatic, no  cyanosis or edema and Homans sign is negative, no sign of DVT  Lab Results: CBC: Recent Labs  07/26/14 0341  WBC 7.2  HGB 10.6*  HCT 31.7*  PLT 235   BMET:  Recent Labs  07/26/14 0341  NA 135  K 4.3  CL 106  CO2 25  GLUCOSE 105*  BUN 14  CREATININE 1.64*  CALCIUM 8.5    PT/INR: No results for input(s): LABPROT, INR in the last 72 hours. Radiology: Dg Chest Port 1 View  07/25/2014   CLINICAL DATA:  64 year old male with a history of acute aortic syndrome and shortness of breath.  EXAM: PORTABLE CHEST - 1 VIEW  COMPARISON:  Multiple prior comparison, most recent plain film 07/22/2014, 07/20/2014, 07/19/2014.  Prior CT 07/17/2014  FINDINGS: Cardiomediastinal silhouette is similar to the comparison plain film. Double density of the superior mediastinum on the left which is similar to prior. Widened mediastinum at the level of T3, similar to the comparison. Tortuosity of the descending thoracic aorta.  No progression of apical capping on the left.  Relatively well aerated lungs, though the retrocardiac region not well evaluated.  No displaced fracture.  IMPRESSION: Similar appearance of the cardiomediastinal silhouette in this patient with known acute aortic syndrome. Double density of the left mediastinal/heart silhouette is compatible  with the known mediastinal hematoma/intramural hematoma, without progression.  Signed,  Dulcy Fanny. Earleen Newport, DO  [facility]  [facility]   Electronically Signed   By: Corrie Mckusick D.O.   On: 07/25/2014 07:23     Assessment/Plan: S/P   intramural hematoma of the aorts metabolic encephalopathy  resolved Plan transfer to stepdown    Grace Isaac 07/26/2014 11:42 AM

## 2014-07-27 NOTE — Progress Notes (Signed)
Physical Therapy Treatment Patient Details Name: Nuriel Dimuzio MRN: [id number] DOB: 08/22/1950 Today's Date: 2014-08-08    History of Present Illness 64 y.o. male admitted with Thoracic aortic intramural hematoma. S/p IVC filter placement 3/09.    PT Comments    Pt moving well with increased activity tolerance today, no presyncope and able to perform HEP. HEP handout provided for general lower body strengthening in prep for future bil hip replacements per pt. Pt encouraged to continue to perform HEP daily. Pt educated for safety with RW and transfers. Will continue to follow.    Follow Up Recommendations  Home health PT     Equipment Recommendations  Rolling walker with 5" wheels    Recommendations for Other Services       Precautions / Restrictions Precautions Precautions: Fall Precaution Comments: bil limited hip ROM    Mobility  Bed Mobility Overal bed mobility: Modified Independent             General bed mobility comments: increased time with mod I secondary to limite hip ROM bil  Transfers Overall transfer level: Modified independent               General transfer comment: pt uses bil UE for transfer with increased posterior lean, decreased hip flexion  Ambulation/Gait Ambulation/Gait assistance: Supervision Ambulation Distance (Feet): 500 Feet Assistive device: Rolling walker (2 wheeled) Gait Pattern/deviations: Shuffle;Trunk flexed;Decreased step length - right;Decreased step length - left   Gait velocity interpretation: Below normal speed for age/gender General Gait Details: pt tolerating well with HR 93-101. increased sway and trunk rotation due to limited flexion and extension of hips with stepping   Stairs            Wheelchair Mobility    Modified Rankin (Stroke Patients Only)       Balance                                    Cognition Arousal/Alertness: Awake/alert Behavior During Therapy: WFL for tasks  assessed/performed Overall Cognitive Status: Within Functional Limits for tasks assessed                      Exercises General Exercises - Lower Extremity Long Arc Quad: AROM;Both;10 reps;Seated Hip ABduction/ADduction: AROM;Both;10 reps;Seated Hip Flexion/Marching: AROM;Both;10 reps;Seated    General Comments        Pertinent Vitals/Pain Pain Assessment: No/denies pain    Home Living                      Prior Function            PT Goals (current goals can now be found in the care plan section) Progress towards PT goals: Progressing toward goals    Frequency       PT Plan Current plan remains appropriate    Co-evaluation             End of Session   Activity Tolerance: Patient tolerated treatment well Patient left: in chair;with call bell/phone within reach     Time: [id number] PT Time Calculation (min) (ACUTE ONLY): 26 min  Charges:  $Gait Training: 8-22 mins $Therapeutic Exercise: 8-22 mins                    G Codes:      Melford Aase August 08, 2014, 12:09 PM Elwyn Reach, [place]

## 2014-07-27 NOTE — Progress Notes (Signed)
Utilization review completed.  

## 2014-07-27 NOTE — Progress Notes (Addendum)
      [address]       [place] [address]             [phone]      Subjective:  Mr. Tim Lair is doing much better.  He has no complaints of chest pain this morning.  He is alert and oriented.  Objective: Vital signs in last 24 hours: Temp:  [98.8 F (37.1 C)-100.3 F (37.9 C)] 99 F (37.2 C) (03/14 0521) Pulse Rate:  [60-94] 61 (03/14 0618) Cardiac Rhythm:  [-] Normal sinus rhythm (03/13 2050) Resp:  [0-26] 17 (03/14 0521) BP: (84-124)/(35-93) 111/79 mmHg (03/14 0618) SpO2:  [94 %-100 %] 98 % (03/14 0521) Weight:  [205 lb (92.987 kg)] 205 lb (92.987 kg) (03/14 0521)  Intake/Output from previous day: 03/13 0701 - 03/14 0700 In: 480 [P.O.:480] Out: 800 [Urine:800]  General appearance: alert, cooperative and no distress Heart: regular rate and rhythm Lungs: clear to auscultation bilaterally Abdomen: soft, non-tender; bowel sounds normal; no masses,  no organomegaly Extremities: extremities normal, atraumatic, no cyanosis or edema Wound: clean and dry  Lab Results:  Recent Labs  07/26/14 0341  WBC 7.2  HGB 10.6*  HCT 31.7*  PLT 235   BMET:  Recent Labs  07/26/14 0341  NA 135  K 4.3  CL 106  CO2 25  GLUCOSE 105*  BUN 14  CREATININE 1.64*  CALCIUM 8.5    PT/INR: No results for input(s): LABPROT, INR in the last 72 hours. ABG    Component Value Date/Time   TCO2 21 07/16/2014 2324   CBG (last 3)  No results for input(s): GLUCAP in the last 72 hours.  Assessment/Plan:  1. Aortic Intramural Hematoma- stable per CT scan 2. Left Femoral DVT- patient not a candidate for anticoagulation, IVC filter placed last week by IR 3. CV- HTN resolved, continue current medication regimen 4. Dispo- patient stable, likely ready for discharge soon, will discuss with Dr. Cyndia Bent   LOS: 10 days    Ellwood Handler 07/27/2014   Chart reviewed, patient examined, agree with above. He is doing well overall. I think he can go home tomorrow if no  changes.

## 2014-07-27 NOTE — Discharge Summary (Signed)
Physician Discharge Summary  Patient ID: Edward Hopkins MRN: [id number] DOB/AGE: 1950/10/26 64 y.o.  Admit date: 07/16/2014 Discharge date: 07/28/2014  Admission Diagnoses:  Patient Active Problem List   Diagnosis Date Noted  . Femoral DVT (deep venous thrombosis)   . Intramural aortic hematoma 07/17/2014   Discharge Diagnoses:   Patient Active Problem List   Diagnosis Date Noted  . Femoral DVT (deep venous thrombosis)   . Intramural aortic hematoma 07/17/2014   Discharged Condition: good  History of Present Illness:   [name] is a 64 yo African American Male with history of polysubstance and nicotine abuse.  He also has a history of Endocarditis which was treated with IV antibiotics.  He presented to the ED on 07/17/2014 with complaints of chest pain, that developed after use of cocaine and taking shots of gin while playing cards.  Evaluation in the ED showed the patient to be hypertensive and there was concern he was suffering from an Aortic Dissection.  He was taken for CTA chest which showed an ascending and descending aortic dissection which a mediastinal hematoma.  Due to this Cardiac surgery was consulted.  The patient was evaluated by Dr. Cyndia Bent who personally reviewed his CTA and felt the patient to have an acute aortic intramural hematoma.  It was felt this was due to untreated hypertension with use of cocaine and alcohol prior to presentation.  It was not felt that emergent surgery would be indicated and was admitted to the SICU for blood pressure control.  Hospital Course:   During hospitalization the patient's blood pressure was monitored and treated with Labatelol.  He responded well to treatment.  He underwent repeat CTA of the chest which showed the intramural aortic hematoma to be stable.  However, there was a Pulmonary embolism present in the LLL.  It was felt this was likely present on admission, but a lower extremity venous duplex would be obtained to rule out acute DVT.   This was performed and showed a left femoral vein DVT.  Due to the aortic hematoma the patient was not felt to be a candidate for anticoagulation.  Therefore, he underwent placement of an IVC filter by Interventional Radiology.  Initially patient did well post procedure.  However he developed severe back pain similar to what he experienced on presentation.  His blood pressure was elevated at that time.  He was moved back to the SICU for closer monitoring.  He was again treated with Labatelol with good response.  He developed some encephalopathy and was combative with staff requiring haldol.  This has all since resolved and the patient back in the telemetry unit.  His blood pressure is well controlled.  He is alert and oriented at this time.  He is maintaining NSR.  I have arranged follow up care with Cyndi Bender PA-C for Primary care coverage. Should no issues arise he is felt medically stable for discharge 07/28/14.  Significant Diagnostic Studies: radiology:   CTA scan:   1. Stable thrombosed thoracic aortic dissection versus acute intramural hematoma, without extension into the abdominal aorta or other complicating features since previous exam. 2. Resolving anterior mediastinal hematoma. 3. Stable nonocclusive left lower lobe pulmonary embolus.  Treatments: IR placement IVC filtre  Disposition: Home  Discharge Medications:    Medication List    TAKE these medications        cloNIDine 0.1 MG tablet  Commonly known as:  CATAPRES  Take 1 tablet (0.1 mg total) by mouth 2 (two) times daily.  labetalol 200 MG tablet  Commonly known as:  NORMODYNE  Take 1 tablet (200 mg total) by mouth 3 (three) times daily.          Follow-up Information    Follow up with CONROY,NATHAN, PA-C On 08/12/2014.   Specialty:  Physician Assistant   Why:  Appointment is at 9:45   Contact information:   [address] [place] [address] [phone]       Follow up with  [name], MD In 4 weeks.   Specialty:  Cardiothoracic Surgery   Why:  Office will contact you with appointment date and time   Contact information:   [address] [place] [address] [phone]       Follow up with [facility].   Why:  CTA chest office will contact you with appointment   Contact information:   [facility]     Signed: Ellwood Handler 07/28/2014, 11:05 AM

## 2014-07-28 ENCOUNTER — Other Ambulatory Visit: Payer: Self-pay | Admitting: *Deleted

## 2014-07-28 DIAGNOSIS — I7100 Dissection of unspecified site of aorta: Secondary | ICD-10-CM

## 2014-07-28 MED ORDER — LACTULOSE 10 GM/15ML PO SOLN
20.0000 g | Freq: Every day | ORAL | Status: DC | PRN
  Filled 2014-07-28: qty 30

## 2014-07-28 MED ORDER — LABETALOL HCL 200 MG PO TABS: 200.0000 mg | ORAL_TABLET | Freq: Three times a day (TID) | ORAL | Status: DC

## 2014-07-28 MED ORDER — CLONIDINE HCL 0.1 MG PO TABS: 0.1000 mg | ORAL_TABLET | Freq: Two times a day (BID) | ORAL | Status: DC

## 2014-07-28 NOTE — Care Management Note (Signed)
    Page 1 of 2   07/28/2014     2:57:45 PM CARE MANAGEMENT NOTE 07/28/2014  Patient:  Edward Hopkins, Edward Hopkins   Account Number:  [id number]  Date Initiated:  07/20/2014  Documentation initiated by:  MAYO,HENRIETTA  Subjective/Objective Assessment:   dx aortic dissection with mediastinal hematoma; lives with SO     Action/Plan:   Anticipated DC Date:  07/28/2014   Anticipated DC Plan:  [place]  CM consult      [facility] Choice  HOME HEALTH   Choice offered to / List presented to:  C-1 Patient   DME arranged  Vassie Moselle      DME agency  [place] arranged  [place].   Status of service:  Completed, signed off Medicare Important Message given?  YES (If response is "NO", the following Medicare IM given date fields will be blank) Date Medicare IM given:  07/20/2014 Medicare IM given by:  MAYO,HENRIETTA Date Additional Medicare IM given:   Additional Medicare IM given by:    Discharge Disposition:  HOME/SELF CARE  Per UR Regulation:  Reviewed for med. necessity/level of care/duration of stay  If discussed at [place] of Stay Meetings, dates discussed:   07/28/2014    Comments:  07/28/14- 1400- Marvetta Gibbons RN, BSN [phone] Pt for d/c home today - would like to have a RW- order has been placed PT also recommending HH-PT- order placed- call made to [facility] with [facility] regarding DME need- RW to be delivered to room prior to discharge- spoke with pt at bedside- according to pt - he had Medicaid of [place] prior to moving to [facility]- plans to apply for  Medicaid- explained to pt he needs to go to DSS in his county to do this - pt also explained that he can go on Medicare.gov to find out about Medicare options. 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, retail with [employer]- per Medicaid guidelines pt does not have a qualifying dx for [facility]- would have to pay out of pocket- [facility] spoke with pt- pt does not wish to pay out of pocket for [facility]- declined any [place] services-  since finding out that pt only has [place] Medicaid pt does not have medication coverage either for medication and now states that he does not have money for medications- will give pt [place] letter for medication assistance- explained MATCH to both pt and SO at bedside- along with copay cost $3 per prescription and one time use.  07/20/2014 [place] MSN BSN CCM PT recommends home therapy and pt agrees.  Provided list of home health agencies, referral made to [facility]. Advised pt to call [place] for list of preferred providers to establish primary care locally.

## 2014-07-28 NOTE — Progress Notes (Signed)
DC instructions reviewed with patient and friend at bedside; paper prescriptions given along with a match letter to friend at bedside; strongly advised patient to pick up prescriptions today and take them as prescribed; strongly advised patient to go to follow up appointment; patient is eager for discharge and states they understand their DC instructions.  Rowe Pavy, RN

## 2014-07-28 NOTE — Progress Notes (Signed)
      [address]       [place] [address]             [phone]      Subjective:  Edward Hopkins has no complaints this morning.  He states he is doing well and feels up to going home.  Objective: Vital signs in last 24 hours: Temp:  [98.2 F (36.8 C)-98.9 F (37.2 C)] 98.2 F (36.8 C) (03/15 0418) Pulse Rate:  [57-92] 58 (03/15 0418) Cardiac Rhythm:  [-] Normal sinus rhythm (03/15 0739) Resp:  [18] 18 (03/15 0418) BP: (85-135)/(70-94) 117/85 mmHg (03/15 0418) SpO2:  [97 %-100 %] 97 % (03/15 0418)  Intake/Output from previous day: 03/14 0701 - 03/15 0700 In: 480 [P.O.:480] Out: 750 [Urine:750]  General appearance: alert, cooperative and no distress Heart: regular rate and rhythm Lungs: clear to auscultation bilaterally Abdomen: soft, non-tender; bowel sounds normal; no masses,  no organomegaly Extremities: extremities normal, atraumatic, no cyanosis or edema  Lab Results:  Recent Labs  07/26/14 0341  WBC 7.2  HGB 10.6*  HCT 31.7*  PLT 235   BMET:  Recent Labs  07/26/14 0341  NA 135  K 4.3  CL 106  CO2 25  GLUCOSE 105*  BUN 14  CREATININE 1.64*  CALCIUM 8.5    PT/INR: No results for input(s): LABPROT, INR in the last 72 hours. ABG    Component Value Date/Time   TCO2 21 07/16/2014 2324   CBG (last 3)  No results for input(s): GLUCAP in the last 72 hours.  Assessment/Plan:  1. Aortic Intramural Hematoma- stable 2. Left Femoral DVT- patient not a candidate for anticoagulation, IVC filter in place 3. CV- HTN resolved, continue current regimen 4. Dispo- patient stable, will arrange PCP followup as patient is not established, will arrange CTA chest for 4 weeks, d/c home later today   LOS: 11 days    Edward Hopkins 07/28/2014

## 2014-08-04 ENCOUNTER — Inpatient Hospital Stay (HOSPITAL_COMMUNITY): Payer: Medicare Other

## 2014-08-04 ENCOUNTER — Inpatient Hospital Stay (HOSPITAL_COMMUNITY)
Admission: EM | Admit: 2014-08-04 | Discharge: 2014-08-06 | DRG: 312 | Disposition: A | Discharge status: Home / Self Care | Payer: Medicare Other | Attending: Internal Medicine | Admitting: Internal Medicine

## 2014-08-04 ENCOUNTER — Encounter (HOSPITAL_COMMUNITY): Payer: Self-pay

## 2014-08-04 DIAGNOSIS — F17210 Nicotine dependence, cigarettes, uncomplicated: Secondary | ICD-10-CM | POA: Diagnosis present

## 2014-08-04 DIAGNOSIS — R42 Dizziness and giddiness: Secondary | ICD-10-CM | POA: Diagnosis not present

## 2014-08-04 DIAGNOSIS — Z6829 Body mass index (BMI) 29.0-29.9, adult: Secondary | ICD-10-CM | POA: Diagnosis not present

## 2014-08-04 DIAGNOSIS — I10 Essential (primary) hypertension: Secondary | ICD-10-CM | POA: Diagnosis present

## 2014-08-04 DIAGNOSIS — R531 Weakness: Secondary | ICD-10-CM | POA: Diagnosis not present

## 2014-08-04 DIAGNOSIS — R059 Cough, unspecified: Secondary | ICD-10-CM

## 2014-08-04 DIAGNOSIS — J90 Pleural effusion, not elsewhere classified: Secondary | ICD-10-CM | POA: Diagnosis present

## 2014-08-04 DIAGNOSIS — I472 Ventricular tachycardia: Secondary | ICD-10-CM | POA: Diagnosis not present

## 2014-08-04 DIAGNOSIS — I129 Hypertensive chronic kidney disease with stage 1 through stage 4 chronic kidney disease, or unspecified chronic kidney disease: Secondary | ICD-10-CM | POA: Diagnosis present

## 2014-08-04 DIAGNOSIS — E440 Moderate protein-calorie malnutrition: Secondary | ICD-10-CM | POA: Diagnosis present

## 2014-08-04 DIAGNOSIS — Z9889 Other specified postprocedural states: Secondary | ICD-10-CM

## 2014-08-04 DIAGNOSIS — E860 Dehydration: Secondary | ICD-10-CM | POA: Diagnosis present

## 2014-08-04 DIAGNOSIS — D638 Anemia in other chronic diseases classified elsewhere: Secondary | ICD-10-CM | POA: Diagnosis present

## 2014-08-04 DIAGNOSIS — E871 Hypo-osmolality and hyponatremia: Secondary | ICD-10-CM | POA: Diagnosis present

## 2014-08-04 DIAGNOSIS — R404 Transient alteration of awareness: Secondary | ICD-10-CM | POA: Diagnosis not present

## 2014-08-04 DIAGNOSIS — I7100 Dissection of unspecified site of aorta: Secondary | ICD-10-CM | POA: Diagnosis present

## 2014-08-04 DIAGNOSIS — N179 Acute kidney failure, unspecified: Secondary | ICD-10-CM | POA: Diagnosis present

## 2014-08-04 DIAGNOSIS — I509 Heart failure, unspecified: Secondary | ICD-10-CM | POA: Diagnosis present

## 2014-08-04 DIAGNOSIS — I82419 Acute embolism and thrombosis of unspecified femoral vein: Secondary | ICD-10-CM | POA: Diagnosis present

## 2014-08-04 DIAGNOSIS — N183 Chronic kidney disease, stage 3 (moderate): Secondary | ICD-10-CM | POA: Diagnosis present

## 2014-08-04 DIAGNOSIS — Z86711 Personal history of pulmonary embolism: Secondary | ICD-10-CM

## 2014-08-04 DIAGNOSIS — Z86718 Personal history of other venous thrombosis and embolism: Secondary | ICD-10-CM

## 2014-08-04 DIAGNOSIS — R05 Cough: Secondary | ICD-10-CM

## 2014-08-04 DIAGNOSIS — I959 Hypotension, unspecified: Secondary | ICD-10-CM | POA: Diagnosis present

## 2014-08-04 DIAGNOSIS — I951 Orthostatic hypotension: Secondary | ICD-10-CM | POA: Diagnosis not present

## 2014-08-04 HISTORY — DX: Acute embolism and thrombosis of right femoral vein: I82.411

## 2014-08-04 HISTORY — DX: Dissection of unspecified site of aorta: I71.00

## 2014-08-04 LAB — I-STAT CHEM 8, ED
BUN: 17 mg/dL (ref 6–23)
CALCIUM ION: 1.18 mmol/L (ref 1.13–1.30)
CREATININE: 1.4 mg/dL — AB (ref 0.50–1.35)
Chloride: 100 mmol/L (ref 96–112)
GLUCOSE: 101 mg/dL — AB (ref 70–99)
HCT: 34 % — ABNORMAL LOW (ref 39.0–52.0)
HEMOGLOBIN: 11.6 g/dL — AB (ref 13.0–17.0)
Potassium: 5.1 mmol/L (ref 3.5–5.1)
Sodium: 134 mmol/L — ABNORMAL LOW (ref 135–145)
TCO2: 19 mmol/L (ref 0–100)

## 2014-08-04 LAB — RAPID URINE DRUG SCREEN, HOSP PERFORMED
Amphetamines: NOT DETECTED
BARBITURATES: NOT DETECTED
Benzodiazepines: NOT DETECTED
COCAINE: NOT DETECTED
Opiates: NOT DETECTED
Tetrahydrocannabinol: NOT DETECTED

## 2014-08-04 LAB — CBC WITH DIFFERENTIAL/PLATELET
BASOS PCT: 0 % (ref 0–1)
Basophils Absolute: 0 10*3/uL (ref 0.0–0.1)
Eosinophils Absolute: 0.4 10*3/uL (ref 0.0–0.7)
Eosinophils Relative: 5 % (ref 0–5)
HEMATOCRIT: 32.3 % — AB (ref 39.0–52.0)
Hemoglobin: 10.7 g/dL — ABNORMAL LOW (ref 13.0–17.0)
LYMPHS PCT: 34 % (ref 12–46)
Lymphs Abs: 2.6 10*3/uL (ref 0.7–4.0)
MCH: 28.3 pg (ref 26.0–34.0)
MCHC: 33.1 g/dL (ref 30.0–36.0)
MCV: 85.4 fL (ref 78.0–100.0)
MONO ABS: 0.9 10*3/uL (ref 0.1–1.0)
Monocytes Relative: 12 % (ref 3–12)
NEUTROS ABS: 3.8 10*3/uL (ref 1.7–7.7)
NEUTROS PCT: 49 % (ref 43–77)
Platelets: 382 10*3/uL (ref 150–400)
RBC: 3.78 MIL/uL — ABNORMAL LOW (ref 4.22–5.81)
RDW: 13.4 % (ref 11.5–15.5)
WBC: 7.7 10*3/uL (ref 4.0–10.5)

## 2014-08-04 LAB — URINALYSIS, ROUTINE W REFLEX MICROSCOPIC
Bilirubin Urine: NEGATIVE
GLUCOSE, UA: NEGATIVE mg/dL
Hgb urine dipstick: NEGATIVE
Ketones, ur: NEGATIVE mg/dL
LEUKOCYTES UA: NEGATIVE
Nitrite: NEGATIVE
Protein, ur: NEGATIVE mg/dL
SPECIFIC GRAVITY, URINE: 1.009 (ref 1.005–1.030)
Urobilinogen, UA: 1 mg/dL (ref 0.0–1.0)
pH: 7 (ref 5.0–8.0)

## 2014-08-04 LAB — I-STAT TROPONIN, ED: TROPONIN I, POC: 0 ng/mL (ref 0.00–0.08)

## 2014-08-04 LAB — LACTIC ACID, PLASMA
LACTIC ACID, VENOUS: 1.3 mmol/L (ref 0.5–2.0)
Lactic Acid, Venous: 1.2 mmol/L (ref 0.5–2.0)

## 2014-08-04 LAB — ETHANOL: Alcohol, Ethyl (B): <5 mg/dL (ref 0–9)

## 2014-08-04 MED ORDER — SODIUM CHLORIDE 0.9 % IV SOLN
INTRAVENOUS | Status: AC
  Administered 2014-08-04: 20:00:00 via INTRAVENOUS

## 2014-08-04 MED ORDER — ENSURE ENLIVE PO LIQD
237.0000 mL | Freq: Two times a day (BID) | ORAL | Status: DC
  Administered 2014-08-05 – 2014-08-06 (×3): 237 mL via ORAL

## 2014-08-04 MED ORDER — SODIUM CHLORIDE 0.9 % IV BOLUS (SEPSIS)
1000.0000 mL | Freq: Once | INTRAVENOUS | Status: AC
  Administered 2014-08-04: 1000 mL via INTRAVENOUS

## 2014-08-04 MED ORDER — SODIUM CHLORIDE 0.9 % IV SOLN: Freq: Once | INTRAVENOUS | Status: DC

## 2014-08-04 NOTE — ED Notes (Signed)
Attempted to call report

## 2014-08-04 NOTE — H&P (Addendum)
History and Physical  Jahsi Padberg [id number] DOB: 01/21/1951 DOA: 08/04/2014  Referring physician: EDP PCP: Pcp Not In System   Chief Complaint: dizziness  HPI: Keston Birden is a 64 y.o. male  Agamjot Mazzara is a 64 y.o. male history of CHF?, hypertension, presents to emergency department complaining of dizziness. Patient was admitted on 07/16/2014 and diagnosed with DVT s/p IVc filter due to not a candidate for anticoagulation secondary to concommittent intramural aortic hematoma thought due to uncontrolled hypertension/cocaine/alcohol. He was treated and discharged home 12 days later on blood pressure medications. Patient is currently taking labetalol 200 mg 2 times a day and clonidine 0.1 mg twice a day. Patient states he has been home for 7 days now since then he has had dizziness upon standing. DOE, dry cough, chills, denies fever, no chest pain, no edema, no ab pain, no diarrhea, reported chronic hip pain. He states he has a blood pressure monitor at home and at one point states his blood pressure dropped to 60s/ 40s. Patient denies any syncopal episodes/no falls.   ED course: she was found to have orthostatic hypotension, EKG no acute changes, cbc stable, mild elevation of cr close to baseline. He received one liter of NS, hospitalist called for admission. I requested ED to order UA/UDS/serum alcohol level, start patient on NS at 100cc/hr, admit to tele bed.  When I entered the patient room, he c/o feeling cold even with multiple layers of blanket on, noted patient to have intermittent dry cough, review medical h/o patient has had h/o septic arthritis of the hip/endocarditis in the past. cxr/blood culture/lactic acid ordered.    Review of Systems:  Detail per HPI, Review of systems are otherwise negative  Past Medical History  Diagnosis Date  . Osteomyelitis   . Septic arthritis     both hips   . Endocarditis   . CHF (congestive heart failure)   . Hypertension   . Intramural aortic  hematoma    History reviewed. No pertinent past surgical history. Social History:  reports that he has been smoking.  He does not have any smokeless tobacco history on file. He reports that he drinks alcohol. He reports that he does not use illicit drugs. Patient lives at home with a friend & is able to participate in activities of daily living , but not doing much due to dizziness the past couple of days.  No Known Allergies  History reviewed. No pertinent family history.    Prior to Admission medications   Medication Sig Start Date End Date Taking? Authorizing Provider  cloNIDine (CATAPRES) 0.1 MG tablet Take 1 tablet (0.1 mg total) by mouth 2 (two) times daily. 07/28/14  Yes Erin R Barrett, PA-C  labetalol (NORMODYNE) 200 MG tablet Take 1 tablet (200 mg total) by mouth 3 (three) times daily. 07/28/14  Yes Freddrick March, PA-C    Physical Exam: BP 115/82 mmHg  Pulse 57  Temp(Src) 97.9 F (36.6 C) (Oral)  Resp 22  SpO2 97%  General:  AAOx3, in mild distress due to feeling cold Eyes: PERRL ENT: unremarkable Neck: supple, no JVD Cardiovascular: RRR Respiratory: CTABL Abdomen: soft/ND/ND, positive bowel sounds Skin: no rash Musculoskeletal:  No edema Psychiatric: calm/cooperative Neurologic: nofocal          Labs on Admission:  Basic Metabolic Panel:  Recent Labs Lab 08/04/14 1446  NA 134*  K 5.1  CL 100  GLUCOSE 101*  BUN 17  CREATININE 1.40*   Liver Function Tests: No results for  input(s): AST, ALT, ALKPHOS, BILITOT, PROT, ALBUMIN in the last 168 hours. No results for input(s): LIPASE, AMYLASE in the last 168 hours. No results for input(s): AMMONIA in the last 168 hours. CBC:  Recent Labs Lab 08/04/14 1435 08/04/14 1446  WBC 7.7  --   NEUTROABS 3.8  --   HGB 10.7* 11.6*  HCT 32.3* 34.0*  MCV 85.4  --   PLT 382  --    Cardiac Enzymes: No results for input(s): CKTOTAL, CKMB, CKMBINDEX, TROPONINI in the last 168 hours.  BNP (last 3 results) No  results for input(s): BNP in the last 8760 hours.  ProBNP (last 3 results) No results for input(s): PROBNP in the last 8760 hours.  CBG: No results for input(s): GLUCAP in the last 168 hours.  Radiological Exams on Admission: No results found.  EKG: Independently reviewed. Sinus rhythm, pvc's, RBBB  Assessment/Plan Present on Admission:  . Orthostatic hypotension . Hypotension  Orthostatic hypotension: hold bp meds, ivf, ua/cxr/blood culture to r/o infection.   Recent h/o DVT, LLL PE, s/p IVC filter, not on anticoagulation due to intramural aortic hematoma  H/o CHF? No echo on file, on exam euvolemic. Echo pending.  H/o substance abuse: reported quit drinking, quit illicit drug use since last admission. Reported never has alcohol withdrawal problem in the past.  Smoker, cessation education provided, declined nicotine patch.  DVT prophylaxis: SCD  Consultants:  none  Code Status: full  Family Communication:  patient  Disposition Plan: admit to tele bed  Time spent: 35mins   Arlette Schaad MD, PhD [facility] Pager [phone] If 7PM-7AM, please contact night-coverage at [email or web address], password [email or web address]   6:30pm  Addendum:  cxr ? Small left sided effusion? D/c ivf.  Lactic acid wnl. UA/serum alcohol unremarkable, UDS pending.

## 2014-08-04 NOTE — ED Notes (Signed)
[facility]- Pt reports he was seen here for aortic hematoma 07/16/14 and discharged with HTN meds. When he takes the meds he feels faint upon standing and BP drops.

## 2014-08-04 NOTE — Progress Notes (Signed)
0710 Received from ER with nurse .Report received . Pt awake ,alert and oriented . Kept omfortable in bed . Safety precaution observed  Endorsed to [employer]

## 2014-08-04 NOTE — ED Provider Notes (Signed)
CSN: [id number]     Arrival date & time 08/04/14  1354 History   First MD Initiated Contact with Patient 08/04/14 1357     Chief Complaint  Patient presents with  . Hypotension  . Near Syncope     (Consider location/radiation/quality/duration/timing/severity/associated sxs/prior Treatment) HPI Edward Hopkins is a 64 y.o. male history of CHF, hypertension, presents to emergency department complaining of dizziness. Patient was admitted on 07/16/2014 and diagnosed with DVT and intramural aortic hematoma. He was treated medically, had IVC filter placed, discharged home 12 days later on blood pressure medications. Patient is currently taking labetalol 200 mg 2 times a day and clonidine 0.1 mg twice a day. Patient states he has been home for 7 days now since then he has had dizziness upon standing. He states he hasn't been able to go anywhere. He states he feels dizzy and lightheaded every single time he stands up. He states he has a blood pressure monitor at home and at one point states his blood pressure dropped to 60s/ 40s. Patient denies any syncopal episodes. He denies any chest pain or shortness of breath. He denies any back pain. No fever or chills. No cough or congestion. No other complaints.   Past Medical History  Diagnosis Date  . Osteomyelitis   . Septic arthritis     both hips   . Endocarditis   . CHF (congestive heart failure)   . Hypertension   . Intramural aortic hematoma    History reviewed. No pertinent past surgical history. History reviewed. No pertinent family history. History  Substance Use Topics  . Smoking status: Current Every Day Smoker -- 1.00 packs/day  . Smokeless tobacco: Not on file  . Alcohol Use: Yes     Comment: occasionally    Review of Systems  Constitutional: Negative for fever and chills.  Respiratory: Negative for cough, chest tightness and shortness of breath.   Cardiovascular: Negative for chest pain, palpitations and leg swelling.   Gastrointestinal: Negative for nausea, vomiting, abdominal pain, diarrhea and abdominal distention.  Genitourinary: Negative for dysuria, urgency, frequency and hematuria.  Musculoskeletal: Negative for myalgias, arthralgias, neck pain and neck stiffness.  Skin: Negative for rash.  Allergic/Immunologic: Negative for immunocompromised state.  Neurological: Positive for dizziness and light-headedness. Negative for weakness, numbness and headaches.  All other systems reviewed and are negative.     Allergies  Review of patient's allergies indicates no known allergies.  Home Medications   Prior to Admission medications   Medication Sig Start Date End Date Taking? Authorizing Provider  cloNIDine (CATAPRES) 0.1 MG tablet Take 1 tablet (0.1 mg total) by mouth 2 (two) times daily. 07/28/14   Erin R Barrett, PA-C  labetalol (NORMODYNE) 200 MG tablet Take 1 tablet (200 mg total) by mouth 3 (three) times daily. 07/28/14   Erin R Barrett, PA-C   BP 99/69 mmHg  Pulse 81  Temp(Src) 97.9 F (36.6 C) (Oral)  Resp 19  SpO2 100% Physical Exam  Constitutional: He is oriented to person, place, and time. He appears well-developed and well-nourished. No distress.  HENT:  Head: Normocephalic.  Eyes: Conjunctivae and EOM are normal. Pupils are equal, round, and reactive to light.  Neck: Normal range of motion. Neck supple.  Cardiovascular: Normal rate, regular rhythm and normal heart sounds.   Pulmonary/Chest: Effort normal and breath sounds normal. No respiratory distress. He has no wheezes. He has no rales.  Abdominal: Soft. There is no tenderness.  Neurological: He is alert and oriented to person, place,  and time.  5/5 and equal upper and lower extremity strength bilaterally. Equal grip strength bilaterally. Normal finger to nose and heel to shin. No pronator drift.   Skin: Skin is warm and dry.  Nursing note and vitals reviewed.   ED Course  Procedures (including critical care time) Labs  Review Labs Reviewed  CBC WITH DIFFERENTIAL/PLATELET - Abnormal; Notable for the following:    RBC 3.78 (*)    Hemoglobin 10.7 (*)    HCT 32.3 (*)    All other components within normal limits  I-STAT CHEM 8, ED - Abnormal; Notable for the following:    Sodium 134 (*)    Creatinine, Ser 1.40 (*)    Glucose, Bld 101 (*)    Hemoglobin 11.6 (*)    HCT 34.0 (*)    All other components within normal limits  I-STAT TROPOININ, ED    Imaging Review No results found.   EKG Interpretation   Date/Time:  Tuesday August 04 2014 15:50:37 EDT Ventricular Rate:  72 PR Interval:  167 QRS Duration: 144 QT Interval:  430 QTC Calculation: 471 R Axis:   -70 Text Interpretation:  Sinus rhythm Multiform ventricular premature  complexes RBBB and LAFB Left ventricular hypertrophy Anterior Q waves,  possibly due to LVH No significant change was found Confirmed by [facility]   MD, TREY ([id number]) on 08/04/2014 4:34:10 PM      MDM   Final diagnoses:  Orthostatic hypotension   Patient here after a recent admission for intramural aortic hematoma, here with orthostatic hypotension. Further diuretics here show a blood pressure drop to 80s/50s. Patient denies any symptoms while laying down. Denies any chest pain. No headache. No shortness of breath. We'll get labs, will call Dr. Cyndia Bent.   4:17 PM  Spoke with Dr. Vivi Martens nurse. Advised medicine to manage. Will call unassigned admission.   4:34 PM Spoke with [facility] hospitalist. Will admit. Asked for UA, Drug screen, alcohol level.   Filed Vitals:   08/04/14 1430 08/04/14 1445 08/04/14 1500 08/04/14 1545  BP: 106/77 99/69 111/80 115/82  Pulse: 69 81 59 57  Temp:      TempSrc:      Resp: 19 19 25 22   SpO2: 97% 100% 100% 97%      Jeannett Senior, PA-C 08/04/14 1635  Serita Grit, MD 08/05/14 1202

## 2014-08-04 NOTE — ED Notes (Signed)
Report given to [facility], RN at the bedside.

## 2014-08-05 DIAGNOSIS — E440 Moderate protein-calorie malnutrition: Secondary | ICD-10-CM

## 2014-08-05 LAB — CBC
HEMATOCRIT: 30.4 % — AB (ref 39.0–52.0)
Hemoglobin: 10 g/dL — ABNORMAL LOW (ref 13.0–17.0)
MCH: 28.2 pg (ref 26.0–34.0)
MCHC: 32.9 g/dL (ref 30.0–36.0)
MCV: 85.9 fL (ref 78.0–100.0)
Platelets: 330 10*3/uL (ref 150–400)
RBC: 3.54 MIL/uL — ABNORMAL LOW (ref 4.22–5.81)
RDW: 13.5 % (ref 11.5–15.5)
WBC: 7 10*3/uL (ref 4.0–10.5)

## 2014-08-05 LAB — COMPREHENSIVE METABOLIC PANEL
ALBUMIN: 2.6 g/dL — AB (ref 3.5–5.2)
ALK PHOS: 57 U/L (ref 39–117)
ALT: 28 U/L (ref 0–53)
AST: 29 U/L (ref 0–37)
Anion gap: 4 — ABNORMAL LOW (ref 5–15)
BUN: 16 mg/dL (ref 6–23)
CO2: 24 mmol/L (ref 19–32)
Calcium: 8.4 mg/dL (ref 8.4–10.5)
Chloride: 106 mmol/L (ref 96–112)
Creatinine, Ser: 1.36 mg/dL — ABNORMAL HIGH (ref 0.50–1.35)
GFR calc Af Amer: 61 mL/min — ABNORMAL LOW (ref >90)
GFR calc non Af Amer: 53 mL/min — ABNORMAL LOW (ref >90)
Glucose, Bld: 96 mg/dL (ref 70–99)
POTASSIUM: 4.7 mmol/L (ref 3.5–5.1)
SODIUM: 134 mmol/L — AB (ref 135–145)
TOTAL PROTEIN: 7.5 g/dL (ref 6.0–8.3)
Total Bilirubin: 0.9 mg/dL (ref 0.3–1.2)

## 2014-08-05 MED ORDER — ADULT MULTIVITAMIN W/MINERALS CH
1.0000 | ORAL_TABLET | Freq: Every day | ORAL | Status: DC
  Administered 2014-08-05 – 2014-08-06 (×2): 1 via ORAL
  Filled 2014-08-05 (×2): qty 1

## 2014-08-05 MED ORDER — GUAIFENESIN-DM 100-10 MG/5ML PO SYRP
5.0000 mL | ORAL_SOLUTION | ORAL | Status: DC | PRN
  Administered 2014-08-05: 5 mL via ORAL
  Filled 2014-08-05: qty 5

## 2014-08-05 MED ORDER — SODIUM CHLORIDE 0.9 % IV SOLN
INTRAVENOUS | Status: AC
  Administered 2014-08-05: 1000 mL via INTRAVENOUS

## 2014-08-05 MED ORDER — BISACODYL 5 MG PO TBEC
10.0000 mg | DELAYED_RELEASE_TABLET | Freq: Once | ORAL | Status: AC
  Administered 2014-08-05: 10 mg via ORAL
  Filled 2014-08-05: qty 2

## 2014-08-05 MED ORDER — CETYLPYRIDINIUM CHLORIDE 0.05 % MT LIQD
7.0000 mL | Freq: Two times a day (BID) | OROMUCOSAL | Status: DC
  Administered 2014-08-05 – 2014-08-06 (×3): 7 mL via OROMUCOSAL

## 2014-08-05 MED ORDER — LABETALOL HCL 100 MG PO TABS
100.0000 mg | ORAL_TABLET | Freq: Two times a day (BID) | ORAL | Status: DC
  Administered 2014-08-05 – 2014-08-06 (×2): 100 mg via ORAL
  Filled 2014-08-05 (×3): qty 1

## 2014-08-05 NOTE — Progress Notes (Signed)
[facility] PROGRESS NOTE  Edward Hopkins [id number] DOB: 1950/11/17 DOA: 08/04/2014 PCP: Pcp Not In System  Brief Summary  Edward Hopkins is a 64 y.o. male history of possible heart failure, hypertension, DVT, intramural aortic hematoma, polysubstance abuse, septic arthritis, endocarditis who presented to emergency department with dizziness. Patient was admitted on 07/16/2014 and diagnosed with DVT s/p IVc filter due to not a candidate for anticoagulation secondary to concommittent intramural aortic hematoma thought due to uncontrolled hypertension/cocaine/alcohol. He was treated and discharged home 12 days later on labetalol 200 mg 2 times a day and clonidine 0.1 mg twice a day. Patient reported dizziness upon standing. DOE, dry cough, chills, and low blood pressure down to 60s/ 40s. Patient denied any syncopal episodes/no falls.  In the ER, he orthostatics were positive.  EKG no acute changes, cbc stable, mild elevation of cr close to baseline. He received one liter of NS, hospitalist called for admission. I requested ED to order UA/UDS/serum alcohol level, start patient on NS at 100cc/hr, admit to tele bed.  Work up underway for underlying infection.  Hydrating and adjusting blood pressure medications.    Assessment/Plan  Orthostatic hypotension, still mildly orthostatic, but improved, BP rising -  Resume labetahold - Continue IV fluids -  ECHO pending  -  PT recommending rolling walker, but otherwise no follow up  Chills with hx of substance abuse, septic arthritis and endocarditis -  Chest x-ray negative for pneumonia -  Urinalysis negative -  Blood cultures pending  Recent h/o DVT, LLL PE, s/p IVC filter, not on anticoagulation due to intramural aortic hematoma -  IVC filter in place  H/o substance abuse: reported quit drinking, quit illicit drug use since last admission. Reported never has alcohol withdrawal problem in the past. -  Start CIWA if signs of withdrawal  Smoker,  cessation education provided, declined nicotine patch.  Probable Anemia of chronic disease -  hgb at baseline -  Iron studies, b12, folate, TSH  Hyponatremia, mild asymptomatic and at baseline -  May be related to CKD vs. dehydration  CKD stage III, creatinine at baseline 1.3  Diet:  Healthy heart Access:  PIV IVF:  yes Proph:  SCDs  Code Status: full Family Communication: patient alone Disposition Plan: possibly home tomorrow if orthostatics negative and BP stable on labetalol and blood cultures negative   Consultants:  None  Procedures:  CXR  Antibiotics:  none   HPI/Subjective:  Feeling better.  Was able to ambulate with PT today wtihout feeling faint or lightheaded.  Denies chest pains but is having some intermittent cough.    Objective: Filed Vitals:   08/04/14 2120 08/05/14 0212 08/05/14 0541 08/05/14 1441  BP: 112/79 135/84 123/87 135/93  Pulse: 78 68 73 69  Temp:  99.1 F (37.3 C) 99.3 F (37.4 C) 99.7 F (37.6 C)  TempSrc:  Oral Oral Oral  Resp: 20 17 18 20   Height:      Weight:   91.491 kg (201 lb 11.2 oz)   SpO2: 93% 97% 99% 96%    Intake/Output Summary (Last 24 hours) at 08/05/14 1457 Last data filed at 08/05/14 1333  Gross per 24 hour  Intake   2202 ml  Output   1625 ml  Net    577 ml   Filed Weights   08/04/14 1901 08/05/14 0541  Weight: 91.491 kg (201 lb 11.2 oz) 91.491 kg (201 lb 11.2 oz)    Exam:   General:  Adult male, No acute distress  HEENT:  NCAT, MMM  Cardiovascular:  RRR, nl S1, S2 no mrg, 2+ pulses, warm extremities  Respiratory:  CTAB, no increased WOB  Abdomen:   NABS, soft, NT/ND  MSK:   Normal tone and bulk, no LEE  Neuro:  Grossly intact  Data Reviewed: Basic Metabolic Panel:  Recent Labs Lab 08/04/14 1446 08/05/14 0342  NA 134* 134*  K 5.1 4.7  CL 100 106  CO2  --  24  GLUCOSE 101* 96  BUN 17 16  CREATININE 1.40* 1.36*  CALCIUM  --  8.4   Liver Function Tests:  Recent Labs Lab  08/05/14 0342  AST 29  ALT 28  ALKPHOS 57  BILITOT 0.9  PROT 7.5  ALBUMIN 2.6*   No results for input(s): LIPASE, AMYLASE in the last 168 hours. No results for input(s): AMMONIA in the last 168 hours. CBC:  Recent Labs Lab 08/04/14 1435 08/04/14 1446 08/05/14 0342  WBC 7.7  --  7.0  NEUTROABS 3.8  --   --   HGB 10.7* 11.6* 10.0*  HCT 32.3* 34.0* 30.4*  MCV 85.4  --  85.9  PLT 382  --  330   Cardiac Enzymes: No results for input(s): CKTOTAL, CKMB, CKMBINDEX, TROPONINI in the last 168 hours. BNP (last 3 results) No results for input(s): BNP in the last 8760 hours.  ProBNP (last 3 results) No results for input(s): PROBNP in the last 8760 hours.  CBG: No results for input(s): GLUCAP in the last 168 hours.  Recent Results (from the past 240 hour(s))  Culture, blood (routine x 2)     Status: None (Preliminary result)   Collection Time: 08/04/14  5:00 PM  Result Value Ref Range Status   Specimen Description BLOOD RIGHT ARM  Final   Special Requests BOTTLES DRAWN AEROBIC AND ANAEROBIC 10CCS  Final   Culture   Final           BLOOD CULTURE RECEIVED NO GROWTH TO DATE CULTURE WILL BE HELD FOR 5 DAYS BEFORE ISSUING A FINAL NEGATIVE REPORT Note: Culture results may be compromised due to an excessive volume of blood received in culture bottles. Performed at [facility]    Report Status PENDING  Incomplete  Culture, blood (routine x 2)     Status: None (Preliminary result)   Collection Time: 08/04/14  5:05 PM  Result Value Ref Range Status   Specimen Description BLOOD RIGHT HAND  Final   Special Requests BOTTLES DRAWN AEROBIC AND ANAEROBIC 5CCS  Final   Culture   Final           BLOOD CULTURE RECEIVED NO GROWTH TO DATE CULTURE WILL BE HELD FOR 5 DAYS BEFORE ISSUING A FINAL NEGATIVE REPORT Performed at [facility]    Report Status PENDING  Incomplete     Studies: Dg Chest Port 1 View  08/04/2014   CLINICAL DATA:  Dizziness. Recently diagnosed with  intramural aortic hematoma.  EXAM: PORTABLE CHEST - 1 VIEW  COMPARISON:  Chest radiograph dated 07/25/2014. CTA chest dated 07/21/2014.  FINDINGS: Lungs are clear. Mild blunting of the left costophrenic angle, suggesting a small left pleural effusion. No pneumothorax.  Stable widening of the mediastinum, corresponding to known acute aortic syndrome.  The heart is normal in size.  IMPRESSION: Stable widening of the mediastinum, corresponding to known acute aortic syndrome.  Suspected small left pleural effusion.   Electronically Signed   By: Julian Hy M.D.   On: 08/04/2014 17:24    Scheduled Meds: .  antiseptic oral rinse  7 mL Mouth Rinse BID  . feeding supplement (ENSURE ENLIVE)  237 mL Oral BID BM  . multivitamin with minerals  1 tablet Oral Daily   Continuous Infusions:   Active Problems:   Orthostatic hypotension   Hypotension   Malnutrition of moderate degree    Time spent: 30 min    Breeonna Mone  [facility] Pager [phone]. If 7PM-7AM, please contact night-coverage at [email or web address], password [facility] 08/05/2014, 2:57 PM  LOS: 1 day
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
## 2014-08-05 NOTE — Evaluation (Signed)
Physical Therapy Evaluation and discharge  Patient Details Name: Edward Hopkins MRN: [id number] DOB: [birth year] Today's Date: 08/05/2014   History of Present Illness  64 y.o. male adm due to dizziness; recently D/C from hospital on new BP medications. pt with hx of DVT; s/p IVC filter.   Clinical Impression  Patient evaluated by Physical Therapy with no further acute PT needs identified. All education has been completed and the patient has no further questions. Pt denied any dizziness this session. Able to make his own bed after session, no LOB noted See below for any follow-up Physial Therapy or equipment needs. PT is signing off. Thank you for this referral.     Follow Up Recommendations      Equipment Recommendations  Rolling walker with 5" wheels;Other (comment) (stated he "may" have RW)    Recommendations for Other Services       Precautions / Restrictions Precautions Precautions: None Precaution Comments: limited Lt LE ROM  Restrictions Weight Bearing Restrictions: No      Mobility  Bed Mobility Overal bed mobility: Modified Independent             General bed mobility comments: incr time but mod I with use of handrails   Transfers Overall transfer level: Modified independent Equipment used: Rolling walker (2 wheeled) Transfers: Sit to/from Stand Sit to Stand: Supervision         General transfer comment: min cues initially due to lack of practice/knowledge with RW; no LOB noted with transfers   Ambulation/Gait Ambulation/Gait assistance: Modified independent (Device/Increase time) Ambulation Distance (Feet): 180 Feet Assistive device: Rolling walker (2 wheeled) Gait Pattern/deviations: Shuffle;Decreased stride length;Antalgic (decr knee and hip flexion) Gait velocity: decreased Gait velocity interpretation: Below normal speed for age/gender General Gait Details: no c/o dizziness with multiple changes of directions  Stairs            Wheelchair  Mobility    Modified Rankin (Stroke Patients Only)       Balance Overall balance assessment: No apparent balance deficits (not formally assessed) Sitting-balance support: Feet supported;No upper extremity supported Sitting balance-Leahy Scale: Normal     Standing balance support: During functional activity;No upper extremity supported Standing balance-Leahy Scale: Fair                               Pertinent Vitals/Pain Pain Assessment: No/denies pain    Home Living Family/patient expects to be discharged to:: Private residence Living Arrangements: Non-relatives/Friends;Spouse/significant other Available Help at Discharge: Available 24 hours/day;Friend(s) Type of Home: House Home Access: Stairs to enter Entrance Stairs-Rails: None Entrance Stairs-Number of Steps: 2 Home Layout: One level Home Equipment: Cane - single point Additional Comments: pt stating friends would be with him 24/7 as needed     Prior Function Level of Independence: Independent with assistive device(s)         Comments: cane for mobility     Hand Dominance   Dominant Hand: Right    Extremity/Trunk Assessment   Upper Extremity Assessment: Defer to OT evaluation           Lower Extremity Assessment: Generalized weakness (c/o chronic Lt LE pain; primarily in knee)      Cervical / Trunk Assessment: Normal  Communication   Communication: No difficulties  Cognition Arousal/Alertness: Awake/alert Behavior During Therapy: WFL for tasks assessed/performed Overall Cognitive Status: Within Functional Limits for tasks assessed  General Comments      Exercises        Assessment/Plan    PT Assessment Patent does not need any further PT services  PT Diagnosis     PT Problem List    PT Treatment Interventions     PT Goals (Current goals can be found in the Care Plan section) Acute Rehab PT Goals Patient Stated Goal: to go home today PT Goal  Formulation: All assessment and education complete, DC therapy    Frequency     Barriers to discharge        Co-evaluation               End of Session Equipment Utilized During Treatment: Gait belt Activity Tolerance: Patient tolerated treatment well Patient left: in bed;with call bell/phone within reach;Other (comment) (sitting EOB) Nurse Communication: Mobility status         Time: [id number] PT Time Calculation (min) (ACUTE ONLY): 11 min   Charges:   PT Evaluation $Initial PT Evaluation Tier I: 1 Procedure     PT G CodesGustavus Bryant, [place]  [id number] 08/05/2014, 1:48 PM

## 2014-08-06 DIAGNOSIS — I82419 Acute embolism and thrombosis of unspecified femoral vein: Secondary | ICD-10-CM

## 2014-08-06 DIAGNOSIS — N179 Acute kidney failure, unspecified: Secondary | ICD-10-CM

## 2014-08-06 LAB — BASIC METABOLIC PANEL
ANION GAP: 8 (ref 5–15)
BUN: 13 mg/dL (ref 6–23)
CALCIUM: 9 mg/dL (ref 8.4–10.5)
CO2: 23 mmol/L (ref 19–32)
Chloride: 104 mmol/L (ref 96–112)
Creatinine, Ser: 1.08 mg/dL (ref 0.50–1.35)
GFR calc Af Amer: 81 mL/min — ABNORMAL LOW (ref >90)
GFR calc non Af Amer: 70 mL/min — ABNORMAL LOW (ref >90)
Glucose, Bld: 106 mg/dL — ABNORMAL HIGH (ref 70–99)
POTASSIUM: 4.1 mmol/L (ref 3.5–5.1)
SODIUM: 135 mmol/L (ref 135–145)

## 2014-08-06 LAB — CBC
HCT: 30.9 % — ABNORMAL LOW (ref 39.0–52.0)
HEMOGLOBIN: 10.2 g/dL — AB (ref 13.0–17.0)
MCH: 28 pg (ref 26.0–34.0)
MCHC: 33 g/dL (ref 30.0–36.0)
MCV: 84.9 fL (ref 78.0–100.0)
PLATELETS: 344 10*3/uL (ref 150–400)
RBC: 3.64 MIL/uL — ABNORMAL LOW (ref 4.22–5.81)
RDW: 13.3 % (ref 11.5–15.5)
WBC: 6.8 10*3/uL (ref 4.0–10.5)

## 2014-08-06 LAB — IRON AND TIBC
Iron: 21 ug/dL — ABNORMAL LOW (ref 42–165)
Saturation Ratios: 8 % — ABNORMAL LOW (ref 20–55)
TIBC: 267 ug/dL (ref 215–435)
UIBC: 246 ug/dL (ref 125–400)

## 2014-08-06 LAB — TSH: TSH: 3.856 u[IU]/mL (ref 0.350–4.500)

## 2014-08-06 LAB — VITAMIN B12: VITAMIN B 12: 445 pg/mL (ref 211–911)

## 2014-08-06 LAB — FERRITIN: FERRITIN: 974 ng/mL — AB (ref 22–322)

## 2014-08-06 MED ORDER — LABETALOL HCL 200 MG PO TABS: 100.0000 mg | ORAL_TABLET | Freq: Three times a day (TID) | ORAL | Status: DC

## 2014-08-06 NOTE — Care Management Note (Signed)
    Page 1 of 1   08/06/2014     3:38:54 PM CARE MANAGEMENT NOTE 08/06/2014  Patient:  Edward Hopkins, Edward Hopkins   Account Number:  [id number]  Date Initiated:  08/06/2014  Documentation initiated by:  Bingham Millette  Subjective/Objective Assessment:   Pt adm on 08/04/14 with orthostatic hypotension, presyncope. PTA, pt resides at home with a friend.     Action/Plan:   PT recommending no OP follow up.  No dc needs identified.   Anticipated DC Date:  08/06/2014   Anticipated DC Plan:  [place]  CM consult      Choice offered to / List presented to:             Status of service:  Completed, signed off Medicare Important Message given?  NA - LOS <3 / Initial given by admissions (If response is "NO", the following Medicare IM given date fields will be blank) Date Medicare IM given:   Medicare IM given by:   Date Additional Medicare IM given:   Additional Medicare IM given by:    Discharge Disposition:  HOME/SELF CARE  Per UR Regulation:  Reviewed for med. necessity/level of care/duration of stay  If discussed at [place] of Stay Meetings, dates discussed:    Comments:

## 2014-08-06 NOTE — Progress Notes (Signed)
*  PRELIMINARY RESULTS* Echocardiogram 2D Echocardiogram has been performed.  Edward Hopkins 08/06/2014, 9:47 AM

## 2014-08-06 NOTE — Discharge Summary (Addendum)
Physician Discharge Summary  Edward Hopkins [id number] DOB: 08-03-1950 DOA: 08/04/2014  PCP: Pcp Not In System  Admit date: 08/04/2014 Discharge date: 08/06/2014  Recommendations for Outpatient Follow-up:  1. Abstain from alcohol and other drug use 2. Discontinued clonidine and reduced labetalol to 100mg  TID for now 3. Advised him to stay hydrated  4. F/u with already scheduled PCP appointment on 3/30:  Please follow up on pending blood cultures, iron studies, b12, and folate tests.  Also, may need to go back on higher doses of labetalol.  Please review blood pressures.    Discharge Diagnoses:  Active Problems:   Intramural aortic hematoma   Femoral DVT (deep venous thrombosis)   Orthostatic hypotension   Hypotension   Malnutrition of moderate degree   Discharge Condition: stable, improved  Diet recommendation: healthy heart  Wt Readings from Last 3 Encounters:  08/06/14 91.2 kg (201 lb 1 oz)  07/27/14 92.987 kg (205 lb)    History of present illness:   Edward Hopkins is a 64 y.o. male history of possible heart failure, hypertension, DVT, intramural aortic hematoma, polysubstance abuse, septic arthritis, endocarditis who presented to emergency department with dizziness. Patient was admitted on 07/16/2014 and diagnosed with DVT s/p IVc filter due to not a candidate for anticoagulation secondary to concommittent intramural aortic hematoma thought due to uncontrolled hypertension/cocaine/alcohol. He was treated and discharged home 12 days later on labetalol 200 mg 2 times a day and clonidine 0.1 mg twice a day. Patient reported dizziness upon standing. DOE, dry cough, chills, and low blood pressure down to 60s/ 40s. Patient denied syncopal episodes and falls. In the ER, his orthostatics were positive. EKG demonstrated no acute changes, cbc was stable, however, he had a mild AKI. He was admitted for presyncope and orthostatic hypotension.  He received IVF and initially his clonidine and  labetalol were held, however, his labetalol was resumed secondary to elevated blood pressures.    Hospital Course:   Orthostatic hypotension,  -  Clonidine and labetalol initially held -  Given IVF and orthostatics improved but remained slightly positive, however, the patient was no longer symptomatic when sitting or standing up - Resumed labetalol after blood pressures trended up to systolic 0000000 - ECHO report pending - PT recommended rolling walker, but otherwise no follow up -  He was able to ambulate freely around his room and down the hall without problem  Chills and low grade fever with hx of substance abuse, septic arthritis and endocarditis, asymptomatic and without WBC.  May be related to DVT and PE - Chest x-ray was negative for pneumonia - Urinalysis was negative - Blood cultures are NGTD -  No localizing symptoms -  Has close outpatient follow up with PCP and advised to seek immediate medical attention should he start spiking high grade fevers or if fevers do not resolve  Recent h/o DVT, LLL PE, s/p IVC filter, not on anticoagulation due to intramural aortic hematoma - IVC filter in place  H/o substance abuse: reported quit drinking, quit illicit drug use since last admission. Reported never has alcohol withdrawal problem in the past. - no signs of alcohol withdrawal during admission  Smoker, cessation education provided, declined nicotine patch.  Probable Anemia of chronic disease - hgb at baseline - Iron studies, b12, folate are pending -  TSH 3.856  Hyponatremia, mild and likely due to dehydration -  Resolved with IVF  Acute on CKD, creatinine trended down from 1.4 to 1.08 with IVF  Moderate protein-calorie malnutrition -  Recommended supplements  Consultants:  None  Procedures:  CXR  ECHO:  Report pending  Antibiotics:  none  Discharge Exam: Filed Vitals:   08/06/14 0810  BP: 120/97  Pulse: 76  Temp: 98.7 F (37.1 C)  Resp: 18    Filed Vitals:   08/05/14 2154 08/05/14 2205 08/06/14 0545 08/06/14 0810  BP: 124/88  123/88 120/97  Pulse: 83  75 76  Temp:  98.6 F (37 C) 98 F (36.7 C) 98.7 F (37.1 C)  TempSrc:  Oral Oral Oral  Resp:   18 18  Height:      Weight:   91.2 kg (201 lb 1 oz)   SpO2:   98% 100%   States he feels well and is ready to go home   General: Adult male, No acute distress, ambulating around room  HEENT: NCAT, MMM  Cardiovascular: RRR, nl S1, S2, 2+ pulses, warm extremities  Respiratory: CTAB, no increased WOB  MSK: Normal tone and bulk, no LEE  Neuro: Grossly intact  Discharge Instructions      Discharge Instructions    Call MD for:  difficulty breathing, headache or visual disturbances    Complete by:  As directed      Call MD for:  extreme fatigue    Complete by:  As directed      Call MD for:  hives    Complete by:  As directed      Call MD for:  persistant dizziness or light-headedness    Complete by:  As directed      Call MD for:  persistant nausea and vomiting    Complete by:  As directed      Call MD for:  severe uncontrolled pain    Complete by:  As directed      Call MD for:  temperature >100.4    Complete by:  As directed      Diet - low sodium heart healthy    Complete by:  As directed      Discharge instructions    Complete by:  As directed   You were hospitalized with near fainting spells.  You were dehydrated and you were on too much blood pressure medication.  Please stop your clonidine and reduce your labetalol to 100mg  three times a day.  You had a low-grade fever during this admission, however, you did not have pneumonia, urinary tract infection.  You have not had any signs of blood stream infection so far, but this test takes at least 5 days to be reported.  We will call you if you develop blood stream infection.  Blood clots can also cause low grade fevers.  If you notice your fevers are worsening or if you develop any signs of infection such  as cough, shortness of breath, pain with urination, please seek immediate medical attention.     Increase activity slowly    Complete by:  As directed             Medication List    STOP taking these medications        cloNIDine 0.1 MG tablet  Commonly known as:  CATAPRES      TAKE these medications        labetalol 200 MG tablet  Commonly known as:  NORMODYNE  Take 0.5 tablets (100 mg total) by mouth 3 (three) times daily.       Follow-up Information    Follow up with CONROY,NATHAN, PA-C On 08/12/2014.   Specialty:  Physician Assistant   Why:  9:45 AM   Contact information:   [place] [address] [phone]       Follow up with [name], MD. Schedule an appointment as soon as possible for a visit in 2 weeks.   Specialty:  Cardiothoracic Surgery   Contact information:   [address] [place] [address] [phone]        The results of significant diagnostics from this hospitalization (including imaging, microbiology, ancillary and laboratory) are listed below for reference.    Significant Diagnostic Studies: Dg Chest 2 View  07/20/2014   CLINICAL DATA:  Shortness of breath.  EXAM: CHEST  2 VIEW  COMPARISON:  07/19/2014.  CT 07/17/2014.  FINDINGS: Mediastinum widening is present consistent with patient's known mediastinal hematoma. Stable cardiomegaly. Normal pulmonary vascularity. Mild bibasilar atelectasis. Small bilateral effusions, left side greater right noted. No pneumothorax. No acute bony abnormality.  IMPRESSION: 1. Stable prominence of the mediastinum consistent with known mediastinal hematoma. 2. Mild bibasilar atelectasis with small bilateral pleural effusions, left side greater than right.   Electronically Signed   By: Marcello Moores  Register   On: 07/20/2014 07:56   Ct Angio Abdomen W/cm &/or Wo Contrast  07/21/2014   CLINICAL DATA:  h/o Acute Intramural Aortic Hematoma- hemodynamically stable,  blood pressure improved with transition to Labatelol, Clonidine, continue Hydralazine prn. Pulm- + dyspnea, CXR yesterday with atelectasis/small effusions continue IS. Renal- mild bump in creatinine yesterday, but overall remains stable, baseline on admission was 1.47  EXAM: CT ANGIOGRAPHY CHEST AND ABDOMEN  TECHNIQUE: Multidetector CT imaging of the chest and abdomen was performed using the standard protocol during bolus administration of intravenous contrast. Multiplanar CT image reconstructions and MIPs were obtained to evaluate the vascular anatomy.  CONTRAST:  141mL OMNIPAQUE IOHEXOL 350 MG/ML SOLN  COMPARISON:  None.  FINDINGS: CTA CHEST  Right arm injection. SVC patent. RV/LV ratio less than 1, normal. Satisfactory opacification of pulmonary arteries noted. There is a nonocclusive pulmonary embolus in the distal left lower lobe pulmonary artery, extending into the segmental branches, stable. No new pulmonary embolus. Patient breathing degrades some of the images. Patent superior and inferior pulmonary veins bilaterally.  Ectatic ascending aorta 4.1 cm maximum diameter. Bovine variant brachiocephalic arterial origin anatomy without proximal stenosis. Intramural hematoma versus thrombosed dissection involving ascending segment, and arch extending into the distal descending segment, showing partial resolution and decrease in density since prior study. There is no flow external to the true lumen nor any dissection flap visible. Aortic lumen remains widely patent. Minimal atheromatous plaque in the distal arch. No stenosis. Small pericardial effusion. Interval decrease in size of anterior mediastinal hematoma.  Small bilateral pleural effusions have developed. Patchy atelectasis/consolidation posteriorly in both lower lobes and in the lingula.  Spurring in the lower thoracic spine.  Sternum intact.  Review of the MIP images confirms the above findings.  CTA ABDOMEN  Arterial findings:  Aorta: No propagation of the  thoracic aortic dissection/intramural hematoma into the abdominal segment. There is some mild eccentric calcified plaque in the infrarenal segment. No aneurysm or stenosis.  Celiac axis:         Patent  Superior mesenteric: Patency  Left renal:          Single, patent  Right renal:         Single, patent  Inferior mesenteric: Patent  Left iliac: Mild nonocclusive plaque. No aneurysm or stenosis.  Right iliac: Scattered nonocclusive  plaque. No aneurysm or stenosis.  Venous findings:     Venous phase imaging not obtained.  Review of the MIP images confirms the above findings.  Nonvascular findings: Unremarkable arterial phase evaluation of liver, nondilated gallbladder, spleen, adrenal glands, pancreas, left kidney. Probable right renal cysts, incompletely characterized. No hydronephrosis. Stomach, small bowel, and colon are nondilated. No ascites. No free air. No adenopathy. Facet DJD in the lower lumbar spine. Advanced bilateral hip degenerative change.  IMPRESSION: 1. Stable thrombosed thoracic aortic dissection versus acute intramural hematoma, without extension into the abdominal aorta or other complicating features since previous exam. 2. Resolving anterior mediastinal hematoma. 3. Stable nonocclusive left lower lobe pulmonary embolus. Critical Value called by telephone at the time of interpretation on 07/21/2014 at 5:55 pm to Dr. Gilford Raid , who verbally acknowledged these results.  4. New bilateral pleural effusions   Electronically Signed   By: Lucrezia Europe M.D.   On: 07/21/2014 17:55   Ir Ivc Filter Plmt / S&i /img Guid/mod Sed  07/22/2014   CLINICAL DATA:  Mediastinal hemorrhage.  DVT.  Pulmonary embolism.  EXAM: IVC FILTER,INFERIOR VENA CAVOGRAM  FLUOROSCOPY TIME:  1 minutes and 13 seconds.  MEDICATIONS AND MEDICAL HISTORY: Versed none mg, Fentanyl 75 mcg.  Additional Medications: None.  ANESTHESIA/SEDATION: Moderate sedation time: 14 minutes  CONTRAST:  40 cc Omnipaque 300  PROCEDURE: The procedure,  risks, benefits, and alternatives were explained to the patient. Questions regarding the procedure were encouraged and answered. The patient understands and consents to the procedure.  The right neck was prepped with Betadine in a sterile fashion, and a sterile drape was applied covering the operative field. A sterile gown and sterile gloves were used for the procedure.  The right internal jugular vein was noted to be patent initially with ultrasound. Under sonographic guidance, a micropuncture needle was inserted into the right internal jugular vein (Ultrasound image documentation was performed). It was removed over an 018 wire which was upsized to a [place]. The sheath was inserted over the wire and into the IVC. IVC venography was performed.  The temporary filter was then deployed in the infrarenal IVC. The sheath was removed and hemostasis was achieved with direct pressure.  FINDINGS: IVC venography confirms renal vein inflow at the L1-2 disc and no venous anomaly or DVT.  The image demonstrates placement of an IVC filter with its tip at the L2 superior endplate.  COMPLICATIONS: None  IMPRESSION: Successful infrarenal IVC filter placement. This is a temporary filter. It can be removed or remain in place to become permanent.   Electronically Signed   By: Marybelle Killings M.D.   On: 07/22/2014 16:40   Dg Chest Port 1 View  08/04/2014   CLINICAL DATA:  Dizziness. Recently diagnosed with intramural aortic hematoma.  EXAM: PORTABLE CHEST - 1 VIEW  COMPARISON:  Chest radiograph dated 07/25/2014. CTA chest dated 07/21/2014.  FINDINGS: Lungs are clear. Mild blunting of the left costophrenic angle, suggesting a small left pleural effusion. No pneumothorax.  Stable widening of the mediastinum, corresponding to known acute aortic syndrome.  The heart is normal in size.  IMPRESSION: Stable widening of the mediastinum, corresponding to known acute aortic syndrome.  Suspected small left pleural effusion.   Electronically Signed    By: Julian Hy M.D.   On: 08/04/2014 17:24   Dg Chest Port 1 View  07/25/2014   CLINICAL DATA:  64 year old male with a history of acute aortic syndrome and shortness of breath.  EXAM: PORTABLE CHEST -  1 VIEW  COMPARISON:  Multiple prior comparison, most recent plain film 07/22/2014, 07/20/2014, 07/19/2014.  Prior CT 07/17/2014  FINDINGS: Cardiomediastinal silhouette is similar to the comparison plain film. Double density of the superior mediastinum on the left which is similar to prior. Widened mediastinum at the level of T3, similar to the comparison. Tortuosity of the descending thoracic aorta.  No progression of apical capping on the left.  Relatively well aerated lungs, though the retrocardiac region not well evaluated.  No displaced fracture.  IMPRESSION: Similar appearance of the cardiomediastinal silhouette in this patient with known acute aortic syndrome. Double density of the left mediastinal/heart silhouette is compatible with the known mediastinal hematoma/intramural hematoma, without progression.  Signed,  Dulcy Fanny. Earleen Newport, DO  [facility]  [facility]   Electronically Signed   By: Corrie Mckusick D.O.   On: 07/25/2014 07:23   Dg Chest Port 1 View  07/22/2014   CLINICAL DATA:  Chest pain with shortness of breath. Recently placed IVC filter. Pulmonary embolism. History of mediastinal hematoma.  EXAM: PORTABLE CHEST - 1 VIEW  COMPARISON:  Chest radiograph 07/20/2014 and CT 07/21/2014  FINDINGS: Persistent enlargement of the mediastinum, particularly along the left side. Findings compatible with the known mediastinal hematoma. There are persistent densities at the left lung base compatible with pleural fluid and volume loss. Upper lungs are clear. Negative for a pneumothorax.  IMPRESSION: Stable enlargement of the mediastinum is compatible with the known hematoma.  Left basilar densities are compatible with pleural fluid and volume loss. Minimal  change from the prior chest radiograph.   Electronically Signed   By: Markus Daft M.D.   On: 07/22/2014 17:24   Dg Chest Port 1 View  07/19/2014   CLINICAL DATA:  Acute thoracic aortic dissection and mediastinal hematoma.  EXAM: PORTABLE CHEST - 1 VIEW  COMPARISON:  07/18/2014  FINDINGS: Left pleural effusion appears decreased in size. Decreased atelectasis seen in the left retrocardiac lung base. Right lung remains clear. Cardiomegaly and mediastinal widening appears stable. No pneumothorax visualized.  IMPRESSION: Decreased left pleural effusion and left lower lung atelectasis.  Stable cardiomegaly and mediastinal widening.   Electronically Signed   By: Earle Gell M.D.   On: 07/19/2014 07:35   Dg Chest Port 1 View  07/18/2014   CLINICAL DATA:  Dissecting thoracic aortic and and mediastinal hematoma.  EXAM: PORTABLE CHEST - 1 VIEW  COMPARISON:  07/16/2014  FINDINGS: Thoracic aortic aneurysm again seen. Cardiomegaly is stable. Increased moderate left pleural effusion demonstrated with left lower lung atelectasis. Right lung remains clear.  IMPRESSION: Increased moderate left pleural effusion and left lower lung atelectasis.  Cardiomegaly and thoracic aortic aneurysm again noted.   Electronically Signed   By: Earle Gell M.D.   On: 07/18/2014 10:52   Dg Chest Portable 1 View  07/16/2014   CLINICAL DATA:  Chest pain.  EXAM: PORTABLE CHEST - 1 VIEW  COMPARISON:  None.  FINDINGS: Enlarged aorta, particularly the arch and descending aorta. There is cardiopericardial enlargement as well. Indistinct left diaphragm, likely from the cardiomegaly; no definitive effusion. No failure or pneumonia. No pneumothorax.  Critical Value/emergent results were called by telephone at the time of interpretation on 07/16/2014 at 11:15 pm to Dr. Juleen China, who verbally acknowledged these results.  IMPRESSION: Abnormal aorta, at least aneurysmal. In the setting of chest pain, recommend CTA to evaluate for dissection or mediastinal  hematoma.   Electronically Signed   By: Neva Seat.D.  On: 07/16/2014 23:16   Ct Angio Chest Aorta W/cm &/or Wo/cm  07/21/2014   CLINICAL DATA:  h/o Acute Intramural Aortic Hematoma- hemodynamically stable, blood pressure improved with transition to Labatelol, Clonidine, continue Hydralazine prn. Pulm- + dyspnea, CXR yesterday with atelectasis/small effusions continue IS. Renal- mild bump in creatinine yesterday, but overall remains stable, baseline on admission was 1.47  EXAM: CT ANGIOGRAPHY CHEST AND ABDOMEN  TECHNIQUE: Multidetector CT imaging of the chest and abdomen was performed using the standard protocol during bolus administration of intravenous contrast. Multiplanar CT image reconstructions and MIPs were obtained to evaluate the vascular anatomy.  CONTRAST:  114mL OMNIPAQUE IOHEXOL 350 MG/ML SOLN  COMPARISON:  None.  FINDINGS: CTA CHEST  Right arm injection. SVC patent. RV/LV ratio less than 1, normal. Satisfactory opacification of pulmonary arteries noted. There is a nonocclusive pulmonary embolus in the distal left lower lobe pulmonary artery, extending into the segmental branches, stable. No new pulmonary embolus. Patient breathing degrades some of the images. Patent superior and inferior pulmonary veins bilaterally.  Ectatic ascending aorta 4.1 cm maximum diameter. Bovine variant brachiocephalic arterial origin anatomy without proximal stenosis. Intramural hematoma versus thrombosed dissection involving ascending segment, and arch extending into the distal descending segment, showing partial resolution and decrease in density since prior study. There is no flow external to the true lumen nor any dissection flap visible. Aortic lumen remains widely patent. Minimal atheromatous plaque in the distal arch. No stenosis. Small pericardial effusion. Interval decrease in size of anterior mediastinal hematoma.  Small bilateral pleural effusions have developed. Patchy atelectasis/consolidation  posteriorly in both lower lobes and in the lingula.  Spurring in the lower thoracic spine.  Sternum intact.  Review of the MIP images confirms the above findings.  CTA ABDOMEN  Arterial findings:  Aorta: No propagation of the thoracic aortic dissection/intramural hematoma into the abdominal segment. There is some mild eccentric calcified plaque in the infrarenal segment. No aneurysm or stenosis.  Celiac axis:         Patent  Superior mesenteric: Patency  Left renal:          Single, patent  Right renal:         Single, patent  Inferior mesenteric: Patent  Left iliac: Mild nonocclusive plaque. No aneurysm or stenosis.  Right iliac: Scattered nonocclusive plaque. No aneurysm or stenosis.  Venous findings:     Venous phase imaging not obtained.  Review of the MIP images confirms the above findings.  Nonvascular findings: Unremarkable arterial phase evaluation of liver, nondilated gallbladder, spleen, adrenal glands, pancreas, left kidney. Probable right renal cysts, incompletely characterized. No hydronephrosis. Stomach, small bowel, and colon are nondilated. No ascites. No free air. No adenopathy. Facet DJD in the lower lumbar spine. Advanced bilateral hip degenerative change.  IMPRESSION: 1. Stable thrombosed thoracic aortic dissection versus acute intramural hematoma, without extension into the abdominal aorta or other complicating features since previous exam. 2. Resolving anterior mediastinal hematoma. 3. Stable nonocclusive left lower lobe pulmonary embolus. Critical Value called by telephone at the time of interpretation on 07/21/2014 at 5:55 pm to Dr. Gilford Raid , who verbally acknowledged these results.  4. New bilateral pleural effusions   Electronically Signed   By: Lucrezia Europe M.D.   On: 07/21/2014 17:55   Ct Angio Chest Aorta W/cm &/or Wo/cm  07/17/2014   CLINICAL DATA:  Acute onset of mid chest pain for 3 hours. Pain when taking deep breaths. Initial encounter.  EXAM: CT ANGIOGRAPHY CHEST, ABDOMEN AND  PELVIS  TECHNIQUE: Multidetector CT imaging through the chest, abdomen and pelvis was performed using the standard protocol during bolus administration of intravenous contrast. Multiplanar reconstructed images and MIPs were obtained and reviewed to evaluate the vascular anatomy.  CONTRAST:  185mL OMNIPAQUE IOHEXOL 350 MG/ML SOLN  COMPARISON:  Chest radiograph performed 07/16/2014  FINDINGS: CTA CHEST FINDINGS  There is evidence of dissection and thrombosis along the ascending and descending thoracic aorta on noncontrast images, with associated moderate to large amount of mediastinal hemorrhage. The dominant mediastinal collection measures 7.9 x 4.9 cm. A small amount of blood is seen tracking into the left pleural space.  On noncontrast images, there is dilatation of the ascending thoracic aorta to 4.7 cm in maximal diameter, and dilatation of the proximal descending thoracic aorta to 4.8 cm in maximal diameter. This includes the underlying thrombosis. The patent lumen of the thoracic aorta measures 4.4 cm along the ascending thoracic aorta, and 3.8 cm along the proximal descending thoracic aorta.  Mild bilateral atelectasis is noted. No pneumothorax is seen. No masses are identified.  There is rightward deviation of the trachea due to the dilatation of the aortic arch and predominantly left-sided mediastinal hemorrhage. Evaluation for mediastinal lymphadenopathy is limited given mediastinal hemorrhage. The great vessels appear grossly intact.  The thyroid gland is unremarkable in appearance. No axillary lymphadenopathy is seen. Mild bilateral gynecomastia is noted.  No acute osseous abnormalities are seen.  Review of the MIP images confirms the above findings.  CTA ABDOMEN AND PELVIS FINDINGS  The abdominal aorta is unremarkable in appearance, aside from mild scattered calcification along the distal abdominal aorta and its branches, extending into both lower extremities. There is no evidence of dissection. The  celiac trunk, superior mesenteric artery, bilateral renal arteries and inferior mesenteric artery appear patent. The IVC is unremarkable in appearance.  Scattered hypodensities are seen within the liver, nonspecific in appearance. The spleen is unremarkable in appearance. The gallbladder is flattened along the diaphragm and difficult to fully assess. The pancreas and adrenal glands are unremarkable.  Scattered right renal cysts are seen, measuring up to 3.9 cm in size. The left kidney is unremarkable in appearance. No significant perinephric stranding is seen. There is no evidence of hydronephrosis. No renal or ureteral stones are seen.  No free fluid is identified. The small bowel is unremarkable in appearance. The stomach is within normal limits. No acute vascular abnormalities are seen.  The appendix is normal in caliber and contains air, without evidence for appendicitis. Apparent wall thickening along the mid to distal sigmoid colon may simply reflect intraluminal contents, though sigmoidoscopy could be considered for further evaluation, when and as deemed clinically appropriate.  The bladder is mildly distended and grossly unremarkable. The prostate remains normal in size. No inguinal lymphadenopathy is seen.  No acute osseous abnormalities are identified. Degenerative change is noted at the hip joints bilaterally, with diffuse sclerosis and bony remodeling. Facet disease is noted along the lower thoracic and lumbar spine.  Review of the MIP images confirms the above findings.  IMPRESSION: 1. 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 The dominant mediastinal hematoma measures 7.9 x 4.9 cm. Small amount of blood noted tracking into the left pleural space. 2. Dilatation of the ascending thoracic aorta to 4.7 cm in maximal diameter, and dilatation of the descending thoracic aorta to 4.8 cm in maximal  diameter. The patent lumen measures 4.4 cm at the ascending thoracic  aorta, and 3.8 cm along the proximal descending thoracic aorta. 3. No dissection flap seen more distally. The abdominal aorta is unremarkable in appearance, aside from mild scattered calcification along the distal abdominal aorta and its branches, extending to both lower extremities. 4. Rightward deviation of the trachea reflects dilatation of the aortic arch and left-sided mediastinal hemorrhage. 5. Apparent wall thickening along the mid to distal sigmoid colon may simply reflect intraluminal contents, though sigmoidoscopy could be considered for further evaluation, when and as deemed clinically appropriate, to exclude mass. 6. Mild bilateral atelectasis noted. 7. Mild bilateral gynecomastia seen. 8. Scattered nonspecific hypodensities in the liver; scattered right renal cysts seen. 9. Degenerative change noted at the hip joints bilaterally, with diffuse sclerosis and bony remodeling.  Critical Value/emergent results were called by telephone at the time of interpretation on 07/17/2014 at 1:31 am to Dr. Maggie Schwalbe , who verbally acknowledged these results.   Electronically Signed   By: Garald Balding M.D.   On: 07/17/2014 01:59   Ct Angio Abd/pel W/ And/or W/o  07/17/2014   CLINICAL DATA:  Acute onset of mid chest pain for 3 hours. Pain when taking deep breaths. Initial encounter.  EXAM: CT ANGIOGRAPHY CHEST, ABDOMEN AND PELVIS  TECHNIQUE: Multidetector CT imaging through the chest, abdomen and pelvis was performed using the standard protocol during bolus administration of intravenous contrast. Multiplanar reconstructed images and MIPs were obtained and reviewed to evaluate the vascular anatomy.  CONTRAST:  169mL OMNIPAQUE IOHEXOL 350 MG/ML SOLN  COMPARISON:  Chest radiograph performed 07/16/2014  FINDINGS: CTA CHEST FINDINGS  There is evidence of dissection and thrombosis along the ascending and descending thoracic aorta on noncontrast  images, with associated moderate to large amount of mediastinal hemorrhage. The dominant mediastinal collection measures 7.9 x 4.9 cm. A small amount of blood is seen tracking into the left pleural space.  On noncontrast images, there is dilatation of the ascending thoracic aorta to 4.7 cm in maximal diameter, and dilatation of the proximal descending thoracic aorta to 4.8 cm in maximal diameter. This includes the underlying thrombosis. The patent lumen of the thoracic aorta measures 4.4 cm along the ascending thoracic aorta, and 3.8 cm along the proximal descending thoracic aorta.  Mild bilateral atelectasis is noted. No pneumothorax is seen. No masses are identified.  There is rightward deviation of the trachea due to the dilatation of the aortic arch and predominantly left-sided mediastinal hemorrhage. Evaluation for mediastinal lymphadenopathy is limited given mediastinal hemorrhage. The great vessels appear grossly intact.  The thyroid gland is unremarkable in appearance. No axillary lymphadenopathy is seen. Mild bilateral gynecomastia is noted.  No acute osseous abnormalities are seen.  Review of the MIP images confirms the above findings.  CTA ABDOMEN AND PELVIS FINDINGS  The abdominal aorta is unremarkable in appearance, aside from mild scattered calcification along the distal abdominal aorta and its branches, extending into both lower extremities. There is no evidence of dissection. The celiac trunk, superior mesenteric artery, bilateral renal arteries and inferior mesenteric artery appear patent. The IVC is unremarkable in appearance.  Scattered hypodensities are seen within the liver, nonspecific in appearance. The spleen is unremarkable in appearance. The gallbladder is flattened along the diaphragm and difficult to fully assess. The pancreas and adrenal glands are unremarkable.  Scattered right renal cysts are seen, measuring up to 3.9 cm in size. The left kidney is unremarkable in appearance. No  significant perinephric stranding is seen. There is no evidence of hydronephrosis. No renal or ureteral stones are  seen.  No free fluid is identified. The small bowel is unremarkable in appearance. The stomach is within normal limits. No acute vascular abnormalities are seen.  The appendix is normal in caliber and contains air, without evidence for appendicitis. Apparent wall thickening along the mid to distal sigmoid colon may simply reflect intraluminal contents, though sigmoidoscopy could be considered for further evaluation, when and as deemed clinically appropriate.  The bladder is mildly distended and grossly unremarkable. The prostate remains normal in size. No inguinal lymphadenopathy is seen.  No acute osseous abnormalities are identified. Degenerative change is noted at the hip joints bilaterally, with diffuse sclerosis and bony remodeling. Facet disease is noted along the lower thoracic and lumbar spine.  Review of the MIP images confirms the above findings.  IMPRESSION: 1. 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 The dominant mediastinal hematoma measures 7.9 x 4.9 cm. Small amount of blood noted tracking into the left pleural space. 2. Dilatation of the ascending thoracic aorta to 4.7 cm in maximal diameter, and dilatation of the descending thoracic aorta to 4.8 cm in maximal diameter. The patent lumen measures 4.4 cm at the ascending thoracic aorta, and 3.8 cm along the proximal descending thoracic aorta. 3. No dissection flap seen more distally. The abdominal aorta is unremarkable in appearance, aside from mild scattered calcification along the distal abdominal aorta and its branches, extending to both lower extremities. 4. Rightward deviation of the trachea reflects dilatation of the aortic arch and left-sided mediastinal hemorrhage. 5. Apparent wall thickening along the mid to distal  sigmoid colon may simply reflect intraluminal contents, though sigmoidoscopy could be considered for further evaluation, when and as deemed clinically appropriate, to exclude mass. 6. Mild bilateral atelectasis noted. 7. Mild bilateral gynecomastia seen. 8. Scattered nonspecific hypodensities in the liver; scattered right renal cysts seen. 9. Degenerative change noted at the hip joints bilaterally, with diffuse sclerosis and bony remodeling.  Critical Value/emergent results were called by telephone at the time of interpretation on 07/17/2014 at 1:31 am to Dr. Maggie Schwalbe , who verbally acknowledged these results.   Electronically Signed   By: Garald Balding M.D.   On: 07/17/2014 01:59    Microbiology: Recent Results (from the past 240 hour(s))  Culture, blood (routine x 2)     Status: None (Preliminary result)   Collection Time: 08/04/14  5:00 PM  Result Value Ref Range Status   Specimen Description BLOOD RIGHT ARM  Final   Special Requests BOTTLES DRAWN AEROBIC AND ANAEROBIC 10CCS  Final   Culture   Final           BLOOD CULTURE RECEIVED NO GROWTH TO DATE CULTURE WILL BE HELD FOR 5 DAYS BEFORE ISSUING A FINAL NEGATIVE REPORT Note: Culture results may be compromised due to an excessive volume of blood received in culture bottles. Performed at [facility]    Report Status PENDING  Incomplete  Culture, blood (routine x 2)     Status: None (Preliminary result)   Collection Time: 08/04/14  5:05 PM  Result Value Ref Range Status   Specimen Description BLOOD RIGHT HAND  Final   Special Requests BOTTLES DRAWN AEROBIC AND ANAEROBIC 5CCS  Final   Culture   Final           BLOOD CULTURE RECEIVED NO GROWTH TO DATE CULTURE WILL BE HELD FOR 5 DAYS BEFORE ISSUING A FINAL NEGATIVE REPORT Performed at [facility]    Report Status PENDING  Incomplete     Labs: Basic Metabolic Panel:  Recent Labs Lab 08/04/14 1446 08/05/14 0342 08/06/14 0530  NA 134* 134* 135  K 5.1 4.7 4.1   CL 100 106 104  CO2  --  24 23  GLUCOSE 101* 96 106*  BUN 17 16 13   CREATININE 1.40* 1.36* 1.08  CALCIUM  --  8.4 9.0   Liver Function Tests:  Recent Labs Lab 08/05/14 0342  AST 29  ALT 28  ALKPHOS 57  BILITOT 0.9  PROT 7.5  ALBUMIN 2.6*   No results for input(s): LIPASE, AMYLASE in the last 168 hours. No results for input(s): AMMONIA in the last 168 hours. CBC:  Recent Labs Lab 08/04/14 1435 08/04/14 1446 08/05/14 0342 08/06/14 0530  WBC 7.7  --  7.0 6.8  NEUTROABS 3.8  --   --   --   HGB 10.7* 11.6* 10.0* 10.2*  HCT 32.3* 34.0* 30.4* 30.9*  MCV 85.4  --  85.9 84.9  PLT 382  --  330 344   Cardiac Enzymes: No results for input(s): CKTOTAL, CKMB, CKMBINDEX, TROPONINI in the last 168 hours. BNP: BNP (last 3 results) No results for input(s): BNP in the last 8760 hours.  ProBNP (last 3 results) No results for input(s): PROBNP in the last 8760 hours.  CBG: No results for input(s): GLUCAP in the last 168 hours.  Time coordinating discharge: 35 minutes  Signed:  Dyonna Jaspers  [facility] 08/06/2014, 11:25 AM

## 2014-08-07 LAB — FOLATE RBC
FOLATE, RBC: 1392 ng/mL (ref >498)
Folate, Hemolysate: 426.1 ng/mL
Hematocrit: 30.6 % — ABNORMAL LOW (ref 37.5–51.0)

## 2014-08-07 LAB — TRANSFERRIN: Transferrin: 214 mg/dL (ref 200–370)

## 2014-08-07 MED FILL — Nutritional Supplement Liquid: ORAL | Qty: 237 | Status: AC

## 2014-08-10 LAB — CULTURE, BLOOD (ROUTINE X 2)
Culture: NO GROWTH
Culture: NO GROWTH

## 2014-08-12 DIAGNOSIS — M009 Pyogenic arthritis, unspecified: Secondary | ICD-10-CM | POA: Diagnosis not present

## 2014-08-12 DIAGNOSIS — I7100 Dissection of unspecified site of aorta: Secondary | ICD-10-CM | POA: Diagnosis not present

## 2014-08-12 DIAGNOSIS — M4806 Spinal stenosis, lumbar region: Secondary | ICD-10-CM | POA: Diagnosis not present

## 2014-08-12 DIAGNOSIS — I959 Hypotension, unspecified: Secondary | ICD-10-CM | POA: Diagnosis not present

## 2014-08-12 DIAGNOSIS — I82412 Acute embolism and thrombosis of left femoral vein: Secondary | ICD-10-CM | POA: Diagnosis not present

## 2014-08-12 DIAGNOSIS — N179 Acute kidney failure, unspecified: Secondary | ICD-10-CM | POA: Diagnosis not present

## 2014-08-18 ENCOUNTER — Encounter (HOSPITAL_COMMUNITY): Payer: Self-pay

## 2014-08-18 ENCOUNTER — Ambulatory Visit (HOSPITAL_COMMUNITY)
Admit: 2014-08-18 | Discharge: 2014-08-18 | Disposition: A | Discharge status: Home / Self Care | Payer: Medicare Other | Attending: Surgery | Admitting: Surgery

## 2014-08-18 DIAGNOSIS — I2699 Other pulmonary embolism without acute cor pulmonale: Secondary | ICD-10-CM | POA: Diagnosis not present

## 2014-08-18 DIAGNOSIS — R58 Hemorrhage, not elsewhere classified: Secondary | ICD-10-CM | POA: Diagnosis not present

## 2014-08-18 DIAGNOSIS — N281 Cyst of kidney, acquired: Secondary | ICD-10-CM | POA: Diagnosis not present

## 2014-08-18 DIAGNOSIS — I7100 Dissection of unspecified site of aorta: Secondary | ICD-10-CM | POA: Insufficient documentation

## 2014-08-18 MED ORDER — IOHEXOL 350 MG/ML SOLN
100.0000 mL | Freq: Once | INTRAVENOUS | Status: AC | PRN
  Administered 2014-08-18: 100 mL via INTRAVENOUS

## 2014-08-19 ENCOUNTER — Encounter: Payer: Self-pay | Admitting: Surgery

## 2014-08-19 ENCOUNTER — Ambulatory Visit (INDEPENDENT_AMBULATORY_CARE_PROVIDER_SITE_OTHER): Payer: Medicare Other | Admitting: Surgery

## 2014-08-19 VITALS — BP 107/79 | HR 86 | Resp 20 | Ht 69.5 in | Wt 199.0 lb

## 2014-08-19 DIAGNOSIS — I719 Aortic aneurysm of unspecified site, without rupture: Secondary | ICD-10-CM | POA: Diagnosis not present

## 2014-08-20 ENCOUNTER — Other Ambulatory Visit: Payer: Self-pay | Admitting: *Deleted

## 2014-08-20 DIAGNOSIS — I712 Thoracic aortic aneurysm, without rupture, unspecified: Secondary | ICD-10-CM

## 2014-08-22 ENCOUNTER — Encounter: Payer: Self-pay | Admitting: Surgery

## 2014-08-22 NOTE — Progress Notes (Signed)
HPI:  The patient returns today for follow up of an acute intramural aortic hematoma mainly involving the descending aorta with some extension proximally into the aortic arch and ascending aorta. His initial CT on presentation was on 07/17/2014 and he responded to antihypertensives with resolution of his back pain. He was transferred to the floor on 3/8 and shortly thereafter developed recurrent severe back pain. A repeat CTA was unchanged but did show a LLL pulmonary embolus that in retrospect was present on his initial CTA but not called. He had an IVC filter placed. His pain resolved with continued antihypertensive therapy and he went home. Since going home he has had no further symptoms. He had a follow up CTA on 08/18/2014 that surprisingly showed progressive abnormality involving the ascending aorta and proximal arch with formation of penetrating ulcers or pseudoaneurysms in the area of the previous ascending intramural hematoma. The ascending aorta has progressively enlarged to 5.8 cm. The aortic root does not appear to be involved. The descending aortic intramural hematoma appears to be resolving as does the anterior mediastinal hemorrhage.   Current Outpatient Prescriptions  Medication Sig Dispense Refill  . labetalol (NORMODYNE) 200 MG tablet Take 0.5 tablets (100 mg total) by mouth 3 (three) times daily. (Patient taking differently: Take 100 mg by mouth 2 (two) times daily. ) 90 tablet 3   No current facility-administered medications for this visit.     Physical Exam: BP 107/79 mmHg  Pulse 86  Resp 20  Ht 5' 9.5" (1.765 m)  Wt 199 lb (90.266 kg)  BMI 28.98 kg/m2  SpO2 97% He looks well Cardiac exam shows a regular rate and rhythm with no mumur Lungs are clear  Diagnostic Tests:  CLINICAL DATA: Recent acute aortic syndrome with intramural hematoma and associated mediastinal hemorrhage. Followup CTA is performed to evaluate the aorta.  EXAM: CT ANGIOGRAPHY CHEST AND  ABDOMEN  TECHNIQUE: Multidetector CT imaging of the chest and abdomen was performed using the standard protocol during bolus administration of intravenous contrast. Multiplanar CT image reconstructions and MIPs were obtained to evaluate the vascular anatomy.  CONTRAST: 168mL OMNIPAQUE IOHEXOL 350 MG/ML SOLN  COMPARISON: CTA studies on 07/21/2014 and 07/17/2014.  FINDINGS: CTA CHEST FINDINGS  Progressive abnormality involving the ascending thoracic aorta and extending up nearly to the level of the aortic isthmus and proximal arch is consistent with worsening type A dissection with formation of penetrating ulcers at the level of previous intramural hemorrhage. The aorta shows progressive dilatation with maximal diameter of 5.8 cm. The sinuses of Valsalva are not involved by dissection. At the level of the ascending thoracic aorta, there is evidence of anterior dissection with formation of distinct areas of contrast filled penetrating ulcers extending into the aortic wall anteriorly. One of these measures roughly 1.4 x 2.8 cm and a second roughly 1.4 x 2.2 cm. As the ascending aorta is followed superiorly, a more discrete level of dissection is identified anteriorly and extending up to the proximal arch. Component of false lumen and or penetrating ulcer at this level measures approximately 1.6 x 4.2 cm. There is no involvement of the great vessel origins by dissection.  Mediastinal hemorrhage has nearly completely resolved with some residual stranding present in the superior mediastinum. The component of intramural hemorrhage involving the distal arch and descending thoracic aorta has also nearly completely resolved. The proximal arch measures 4.0 cm. The distal arch measures 3.8 cm. The descending thoracic aorta measures 3.6 cm.  The heart size is  normal. There is a trace amount of pericardial fluid. Pulmonary arteries are also well opacified and show resolution of  previously noted nonocclusive left lower lobe pulmonary embolism. Scattered areas of pulmonary parenchymal scarring and atelectasis present without evidence of consolidation, edema, pleural effusion or pneumothorax. Mild thoracic spondylosis present.  Review of the MIP images confirms the above findings.  CTA ABDOMEN FINDINGS  The abdominal aorta is of normal caliber with maximal diameter of 3.0 x 3.2 cm just above the celiac axis. The infrarenal aorta measures 2.3 cm. Visceral arteries show normal patency including the celiac axis, superior mesenteric artery, bilateral single renal arteries and the inferior mesenteric artery. The common iliac arteries are widely patent bilaterally without aneurysmal disease.  An IVC filter is present with positioning in the infrarenal IVC and normal orientation. No visible solid organ abnormalities during arterial phase of imaging. Renal cyst on the right measures 3.7 cm and shows benign internal fluid density. No incidental masses or enlarged lymph nodes are seen. No hernias are identified. Mild spondylosis is seen throughout the visualized spine.  Review of the MIP images confirms the above findings.  IMPRESSION: 1. Progressive abnormality involving the ascending thoracic aorta with multiple areas of penetrating ulcer disease now identified into the anterior aortic wall with a more focal true dissection seen in the distal ascending aorta. Maximal diameter of the ascending thoracic aorta is now 5.8 cm. Dissection and dilatation does not involve the sinuses of Valsalva. 2. Near resolution of mediastinal hemorrhage since prior imaging with no evidence of new hemorrhage. 3. Improved appearance of intramural hemorrhage involving the aortic arch and proximal descending thoracic aorta without progressive distal component of dissection or penetrating ulcer disease. 4. Resolution of nonocclusive left lower lobe pulmonary embolism. 5. No acute  findings involving the aorta or its branches in the abdomen. Critical Value/emergent results were called by telephone at the time of interpretation on 08/18/2014 at 10:56 AM to Dr. Gilford Raid , who verbally acknowledged these results.   Electronically Signed  By: Aletta Edouard M.D.  On: 08/18/2014 10:59   Impression:  He has progressive enlargement of the ascending aorta with pseudoaneurysm formation. This extends up to the proximal arch but does not appear to involve the aortic root. The proximal coronaries have minimal calcification and fill with contrast so I doubt that he has significant proximal coronary disease. I don't thin a cath would be wise with the pseudoaneurysm formation in the ascending aorta. A nuclear stress test would not be useful because even if there is an abnormality I don't think a cath could be done safely. This will require surgical replacement of the ascending aorta and part or all of the aortic arch. Hopefully the aortic root can remain. His aortic valve was normal on echo. I discussed the operative procedure with the patient and his girlfriend including alternatives, benefits and risks; including but not limited to bleeding, blood transfusion, infection, stroke, myocardial infarction, heart block requiring a permanent pacemaker, organ dysfunction, and death.  Ardyth Man understands and agrees to proceed.  We will schedule surgery for next Friday.   Plan:  Replacement of the ascending aorta and part of all of the aortic arch using circulatory arrest and right axillary cannulation.   [name], MD [facility] [phone]

## 2014-08-24 ENCOUNTER — Inpatient Hospital Stay (HOSPITAL_COMMUNITY)
Admission: RE | Admit: 2014-08-24 | Discharge: 2014-09-03 | DRG: 220 | Disposition: A | Discharge status: Home / Self Care | Payer: Medicare Other | Source: Ambulatory Visit | Attending: Surgery | Admitting: Surgery

## 2014-08-24 ENCOUNTER — Encounter (HOSPITAL_COMMUNITY): Payer: Self-pay

## 2014-08-24 ENCOUNTER — Ambulatory Visit (HOSPITAL_COMMUNITY)
Admission: RE | Admit: 2014-08-24 | Discharge: 2014-08-24 | Disposition: A | Discharge status: Home / Self Care | Payer: Medicare Other | Source: Ambulatory Visit | Attending: Surgery | Admitting: Surgery

## 2014-08-24 ENCOUNTER — Encounter (HOSPITAL_COMMUNITY)
Admission: RE | Admit: 2014-08-24 | Discharge: 2014-08-24 | Disposition: A | Discharge status: Home / Self Care | Payer: Medicare Other | Source: Ambulatory Visit | Attending: Surgery | Admitting: Surgery

## 2014-08-24 VITALS — BP 102/80 | HR 82 | Temp 98.7°F | Resp 20 | Ht 68.0 in | Wt 207.0 lb

## 2014-08-24 DIAGNOSIS — K040 Pulpitis: Secondary | ICD-10-CM | POA: Diagnosis present

## 2014-08-24 DIAGNOSIS — M009 Pyogenic arthritis, unspecified: Secondary | ICD-10-CM | POA: Diagnosis not present

## 2014-08-24 DIAGNOSIS — I10 Essential (primary) hypertension: Secondary | ICD-10-CM | POA: Diagnosis present

## 2014-08-24 DIAGNOSIS — I712 Thoracic aortic aneurysm, without rupture, unspecified: Secondary | ICD-10-CM

## 2014-08-24 DIAGNOSIS — Z0183 Encounter for blood typing: Secondary | ICD-10-CM | POA: Insufficient documentation

## 2014-08-24 DIAGNOSIS — M264 Malocclusion, unspecified: Secondary | ICD-10-CM | POA: Diagnosis present

## 2014-08-24 DIAGNOSIS — I7101 Dissection of thoracic aorta: Secondary | ICD-10-CM | POA: Diagnosis not present

## 2014-08-24 DIAGNOSIS — F17200 Nicotine dependence, unspecified, uncomplicated: Secondary | ICD-10-CM | POA: Insufficient documentation

## 2014-08-24 DIAGNOSIS — Z01812 Encounter for preprocedural laboratory examination: Secondary | ICD-10-CM | POA: Insufficient documentation

## 2014-08-24 DIAGNOSIS — Z01818 Encounter for other preprocedural examination: Secondary | ICD-10-CM | POA: Insufficient documentation

## 2014-08-24 DIAGNOSIS — J9811 Atelectasis: Secondary | ICD-10-CM | POA: Diagnosis present

## 2014-08-24 DIAGNOSIS — Z86711 Personal history of pulmonary embolism: Secondary | ICD-10-CM

## 2014-08-24 DIAGNOSIS — K029 Dental caries, unspecified: Secondary | ICD-10-CM | POA: Diagnosis present

## 2014-08-24 DIAGNOSIS — K045 Chronic apical periodontitis: Secondary | ICD-10-CM | POA: Diagnosis present

## 2014-08-24 DIAGNOSIS — Z01810 Encounter for preprocedural cardiovascular examination: Secondary | ICD-10-CM | POA: Diagnosis not present

## 2014-08-24 DIAGNOSIS — M160 Bilateral primary osteoarthritis of hip: Secondary | ICD-10-CM | POA: Diagnosis present

## 2014-08-24 DIAGNOSIS — F17210 Nicotine dependence, cigarettes, uncomplicated: Secondary | ICD-10-CM | POA: Diagnosis present

## 2014-08-24 DIAGNOSIS — K047 Periapical abscess without sinus: Secondary | ICD-10-CM | POA: Diagnosis present

## 2014-08-24 DIAGNOSIS — Z86718 Personal history of other venous thrombosis and embolism: Secondary | ICD-10-CM

## 2014-08-24 DIAGNOSIS — I7121 Aneurysm of the ascending aorta, without rupture: Secondary | ICD-10-CM

## 2014-08-24 DIAGNOSIS — Z95828 Presence of other vascular implants and grafts: Secondary | ICD-10-CM

## 2014-08-24 DIAGNOSIS — I6523 Occlusion and stenosis of bilateral carotid arteries: Secondary | ICD-10-CM

## 2014-08-24 DIAGNOSIS — K0889 Other specified disorders of teeth and supporting structures: Secondary | ICD-10-CM | POA: Diagnosis present

## 2014-08-24 DIAGNOSIS — D649 Anemia, unspecified: Secondary | ICD-10-CM | POA: Diagnosis present

## 2014-08-24 HISTORY — DX: Acute embolism and thrombosis of unspecified deep veins of unspecified lower extremity: I82.409

## 2014-08-24 HISTORY — DX: Constipation, unspecified: K59.00

## 2014-08-24 LAB — BLOOD GAS, ARTERIAL
Acid-base deficit: 1.2 mmol/L (ref 0.0–2.0)
Bicarbonate: 22.7 mEq/L (ref 20.0–24.0)
DRAWN BY: 421801
FIO2: 0.21 %
O2 Saturation: 98.6 %
PCO2 ART: 35.6 mmHg (ref 35.0–45.0)
PH ART: 7.42 (ref 7.350–7.450)
Patient temperature: 98.6
TCO2: 23.8 mmol/L (ref 0–100)
pO2, Arterial: 102 mmHg — ABNORMAL HIGH (ref 80.0–100.0)

## 2014-08-24 LAB — PULMONARY FUNCTION TEST
DL/VA % PRED: 85 %
DL/VA: 3.83 ml/min/mmHg/L
DLCO COR: 18.75 ml/min/mmHg
DLCO UNC % PRED: 53 %
DLCO UNC: 15.92 ml/min/mmHg
DLCO cor % pred: 63 %
FEF 25-75 Post: 2.63 L/sec
FEF 25-75 Pre: 1.83 L/sec
FEF2575-%Change-Post: 43 %
FEF2575-%Pred-Post: 107 %
FEF2575-%Pred-Pre: 74 %
FEV1-%Change-Post: 7 %
FEV1-%Pred-Post: 84 %
FEV1-%Pred-Pre: 78 %
FEV1-PRE: 2.17 L
FEV1-Post: 2.33 L
FEV1FVC-%Change-Post: 4 %
FEV1FVC-%PRED-PRE: 99 %
FEV6-%CHANGE-POST: 3 %
FEV6-%PRED-POST: 82 %
FEV6-%PRED-PRE: 80 %
FEV6-POST: 2.86 L
FEV6-Pre: 2.76 L
FEV6FVC-%CHANGE-POST: 0 %
FEV6FVC-%PRED-POST: 102 %
FEV6FVC-%Pred-Pre: 101 %
FVC-%CHANGE-POST: 2 %
FVC-%PRED-POST: 80 %
FVC-%Pred-Pre: 78 %
FVC-Post: 2.9 L
FVC-Pre: 2.83 L
Post FEV1/FVC ratio: 80 %
Post FEV6/FVC ratio: 99 %
Pre FEV1/FVC ratio: 77 %
Pre FEV6/FVC Ratio: 98 %
RV % pred: 101 %
RV: 2.3 L
TLC % pred: 82 %
TLC: 5.42 L

## 2014-08-24 LAB — COMPREHENSIVE METABOLIC PANEL
ALT: 23 U/L (ref 0–53)
ANION GAP: 12 (ref 5–15)
AST: 29 U/L (ref 0–37)
Albumin: 3.1 g/dL — ABNORMAL LOW (ref 3.5–5.2)
Alkaline Phosphatase: 65 U/L (ref 39–117)
BUN: 18 mg/dL (ref 6–23)
CALCIUM: 9.1 mg/dL (ref 8.4–10.5)
CO2: 19 mmol/L (ref 19–32)
Chloride: 105 mmol/L (ref 96–112)
Creatinine, Ser: 1.23 mg/dL (ref 0.50–1.35)
GFR, EST AFRICAN AMERICAN: 69 mL/min — AB (ref >90)
GFR, EST NON AFRICAN AMERICAN: 59 mL/min — AB (ref >90)
Glucose, Bld: 102 mg/dL — ABNORMAL HIGH (ref 70–99)
Potassium: 4.7 mmol/L (ref 3.5–5.1)
Sodium: 136 mmol/L (ref 135–145)
TOTAL PROTEIN: 7.9 g/dL (ref 6.0–8.3)
Total Bilirubin: 0.8 mg/dL (ref 0.3–1.2)

## 2014-08-24 LAB — URINALYSIS, ROUTINE W REFLEX MICROSCOPIC
Bilirubin Urine: NEGATIVE
GLUCOSE, UA: NEGATIVE mg/dL
Hgb urine dipstick: NEGATIVE
Ketones, ur: NEGATIVE mg/dL
LEUKOCYTES UA: NEGATIVE
Nitrite: NEGATIVE
PH: 6 (ref 5.0–8.0)
Protein, ur: NEGATIVE mg/dL
SPECIFIC GRAVITY, URINE: 1.017 (ref 1.005–1.030)
Urobilinogen, UA: 1 mg/dL (ref 0.0–1.0)

## 2014-08-24 LAB — CBC
HEMATOCRIT: 32.5 % — AB (ref 39.0–52.0)
HEMOGLOBIN: 10.4 g/dL — AB (ref 13.0–17.0)
MCH: 27.2 pg (ref 26.0–34.0)
MCHC: 32 g/dL (ref 30.0–36.0)
MCV: 85.1 fL (ref 78.0–100.0)
Platelets: 199 10*3/uL (ref 150–400)
RBC: 3.82 MIL/uL — AB (ref 4.22–5.81)
RDW: 14.8 % (ref 11.5–15.5)
WBC: 6.1 10*3/uL (ref 4.0–10.5)

## 2014-08-24 LAB — PROTIME-INR
INR: 1.16 (ref 0.00–1.49)
Prothrombin Time: 14.9 seconds (ref 11.6–15.2)

## 2014-08-24 LAB — APTT: APTT: 30 s (ref 24–37)

## 2014-08-24 LAB — SURGICAL PCR SCREEN
MRSA, PCR: NEGATIVE
Staphylococcus aureus: NEGATIVE

## 2014-08-24 MED ORDER — ALBUTEROL SULFATE (2.5 MG/3ML) 0.083% IN NEBU
2.5000 mg | INHALATION_SOLUTION | Freq: Once | RESPIRATORY_TRACT | Status: AC
  Administered 2014-08-24: 2.5 mg via RESPIRATORY_TRACT

## 2014-08-24 NOTE — Pre-Procedure Instructions (Signed)
[place]  08/24/2014   Your procedure is scheduled on:  Thursday, April 14.  Report to [facility] Admitting at 5:30AM.   Call this number if you have problems the morning of surgery: [phone]               For any other questions, please call [phone], Monday - Friday 8 AM - 4 PM.   Remember:   Do not eat food or drink liquids after midnight Wednesday, April  13.   Take these medicines the morning of surgery with A SIP OF WATER: labetalol (NORMODYNE).             Do not take any Aspirin or Aleve or Advil.   Do not wear jewelry, make-up or nail polish.  Do not wear lotions, powders, or perfumes.  Men may shave face and neck.  Do not bring valuables to the hospital.             [facility] is not responsible for any belongings or valuables.               Contacts, dentures or bridgework may not be worn into surgery.  Leave suitcase in the car. After surgery it may be brought to your room.  For patients admitted to the hospital, discharge time is determined by your  treatment team.              Special Instructions: Review  [place] - Preparing For Surgery.   Please read over the following fact sheets that you were given: Pain Booklet, Coughing and Deep Breathing, Blood Transfusion Information and Surgical Site Infection Prevention and Incentive Spirometry

## 2014-08-24 NOTE — Progress Notes (Signed)
.  VASCULAR LAB PRELIMINARY  PRELIMINARY  PRELIMINARY  PRELIMINARY  Pre-op Cardiac Surgery  Carotid Findings:  Bilateral:  1-39% ICA stenosis.  Vertebral artery flow is antegrade.     Upper Extremity Right Left  Brachial Pressures 112 Triphasic 125 Triphasic  Radial Waveforms Triphasic Triphasic  Ulnar Waveforms Triphasic Triphasic  Palmar Arch (Allen's Test) Normal Normal   Findings:   Doppler waveforms remained normal bilaterally with both radial and ulnar compressions.   Lower  Extremity Right Left  Dorsalis Pedis    Anterior Tibial    Posterior Tibial    Ankle/Brachial Indices      Findings:  Pedal pulses palpable bilaterally.   Edward Hopkins, RVS 08/24/2014, 1:46 PM

## 2014-08-25 LAB — HEMOGLOBIN A1C
HEMOGLOBIN A1C: 5.9 % — AB (ref 4.8–5.6)
MEAN PLASMA GLUCOSE: 123 mg/dL

## 2014-08-26 MED ORDER — SODIUM CHLORIDE 0.9 % IV SOLN
INTRAVENOUS | Status: AC
  Administered 2014-08-27: 1.2 [IU]/h via INTRAVENOUS
  Filled 2014-08-26: qty 2.5

## 2014-08-26 MED ORDER — PHENYLEPHRINE HCL 10 MG/ML IJ SOLN
30.0000 ug/min | INTRAVENOUS | Status: AC
  Administered 2014-08-27: 25 ug/min via INTRAVENOUS
  Filled 2014-08-26: qty 2

## 2014-08-26 MED ORDER — DEXMEDETOMIDINE HCL IN NACL 400 MCG/100ML IV SOLN
0.1000 ug/kg/h | INTRAVENOUS | Status: AC
  Administered 2014-08-27: .2 ug/kg/h via INTRAVENOUS
  Filled 2014-08-26: qty 100

## 2014-08-26 MED ORDER — PLASMA-LYTE 148 IV SOLN
INTRAVENOUS | Status: DC
  Filled 2014-08-26: qty 2.5

## 2014-08-26 MED ORDER — SODIUM CHLORIDE 0.9 % IV SOLN
INTRAVENOUS | Status: DC
  Filled 2014-08-26: qty 30

## 2014-08-26 MED ORDER — SODIUM CHLORIDE 0.9 % IV SOLN
INTRAVENOUS | Status: AC
  Administered 2014-08-27: 69.8 mL/h via INTRAVENOUS
  Administered 2014-08-27: 14 mL/h via INTRAVENOUS
  Filled 2014-08-26: qty 40

## 2014-08-26 MED ORDER — VANCOMYCIN HCL 10 G IV SOLR
1250.0000 mg | INTRAVENOUS | Status: AC
  Administered 2014-08-27: 1250 mg via INTRAVENOUS
  Filled 2014-08-26: qty 1250

## 2014-08-26 MED ORDER — DEXTROSE 5 % IV SOLN
1.5000 g | INTRAVENOUS | Status: AC
  Administered 2014-08-27: 750 g via INTRAVENOUS
  Administered 2014-08-27: 1.5 g via INTRAVENOUS
  Filled 2014-08-26 (×2): qty 1.5

## 2014-08-26 MED ORDER — MAGNESIUM SULFATE 50 % IJ SOLN
40.0000 meq | INTRAMUSCULAR | Status: DC
  Filled 2014-08-26: qty 10

## 2014-08-26 MED ORDER — POTASSIUM CHLORIDE 2 MEQ/ML IV SOLN
80.0000 meq | INTRAVENOUS | Status: DC
  Filled 2014-08-26: qty 40

## 2014-08-26 MED ORDER — METOPROLOL TARTRATE 12.5 MG HALF TABLET
12.5000 mg | ORAL_TABLET | Freq: Once | ORAL | Status: AC
  Administered 2014-08-27: 12.5 mg via ORAL
  Filled 2014-08-26: qty 1

## 2014-08-26 MED ORDER — DEXTROSE 5 % IV SOLN
750.0000 mg | INTRAVENOUS | Status: DC
  Filled 2014-08-26: qty 750

## 2014-08-26 MED ORDER — NITROGLYCERIN IN D5W 200-5 MCG/ML-% IV SOLN
2.0000 ug/min | INTRAVENOUS | Status: DC
  Filled 2014-08-26: qty 250

## 2014-08-26 MED ORDER — DOPAMINE-DEXTROSE 3.2-5 MG/ML-% IV SOLN
0.0000 ug/kg/min | INTRAVENOUS | Status: AC
  Administered 2014-08-27: 5 ug/kg/min via INTRAVENOUS
  Filled 2014-08-26: qty 250

## 2014-08-26 MED ORDER — CHLORHEXIDINE GLUCONATE 4 % EX LIQD
30.0000 mL | CUTANEOUS | Status: DC
  Filled 2014-08-26: qty 30

## 2014-08-26 MED ORDER — EPINEPHRINE HCL 1 MG/ML IJ SOLN
0.0000 ug/min | INTRAVENOUS | Status: DC
  Filled 2014-08-26: qty 4

## 2014-08-26 NOTE — H&P (Signed)
[address]       [place] [address]             [phone]      Cardiothoracic Surgery Admission History and Physical   The patient is a 64 year old gentleman who is from [place] but has been in [place] for a while living with his girlfriend while he decides about moving down here permanently. He did some cocaine on the night before admission and drank a few shots of gin and was playing cards when he started having mid chest pain that worsened and he came to the ER. He was markedly hypertensive on arrival and a CTA of the chest showed what radiology felt was an ascending and descending aortic dissection with mediastinal hematoma. After arrival his pain improved and is currently 2/10. He has a history of hypertension but has not taken his meds for a long time since he ran out and was transitioning from [place] to [facility] with no medical doctor. He smokes 1 ppd for 50 years and drinks alcohol frequently. He has a history of IVDA until 1972 and had a history of endocarditis in the distant past treated with antibiotics. CTA of the chest showed an acute intramural aortic hematoma mainly involving the descending aorta with some extension proximally into the aortic arch and ascending aorta. His initial CT on presentation was on 07/17/2014 and he responded to antihypertensives with resolution of his back pain. He was transferred to the floor on 3/8 and shortly thereafter developed recurrent severe back pain. A repeat CTA was unchanged but did show a LLL pulmonary embolus that in retrospect was present on his initial CTA but not called. He had an IVC filter placed. His pain resolved with continued antihypertensive therapy and he went home. Since going home he has had no further symptoms. He had a follow up CTA on 08/18/2014 that surprisingly showed progressive abnormality involving the ascending aorta and proximal arch with formation of penetrating ulcers or pseudoaneurysms in the area of  the previous ascending intramural hematoma. The ascending aorta has progressively enlarged to 5.8 cm. The aortic root does not appear to be involved. The descending aortic intramural hematoma appears to be resolving as does the anterior mediastinal hemorrhage.   Past Medical History  Diagnosis Date  . 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 He says he had a groin infection that had to be drained surgically and resulted in bacteremia and septic arthritis   . Septic arthritis     both hips   . Endocarditis many years ago. Does not know which valve. Due to IVDA     History reviewed. past surgical history: had incision and drainage of right groin infection with sepsis.  History reviewed. No pertinent family history. No history of aortic disease, heart disease or connective tissue disorder.  Social History: reports that he has been smoking. 1 ppd for 50 years. He does not have any smokeless tobacco history on file. He reports that he drinks alcohol frequently. He reports that he uses illicit drugs (Cocaine and Marijuana). 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 Needs bilateral hip replacement.  Allergies: No Known Allergies   (Not in a hospital admission)   Lab Results Last 48 Hours    Results for orders placed or performed during the hospital encounter of 07/16/14 (from the past 48 hour(s))  Urinalysis, Routine w reflex microscopic Status: Abnormal   Collection  Time: 07/16/14 10:57 PM  Result Value Ref Range   Color, Urine YELLOW YELLOW   APPearance CLOUDY (A) CLEAR   Specific Gravity, Urine 1.019 1.005 - 1.030   pH 5.5 5.0 - 8.0   Glucose, UA NEGATIVE NEGATIVE mg/dL   Hgb urine dipstick NEGATIVE NEGATIVE   Bilirubin Urine NEGATIVE NEGATIVE   Ketones, ur NEGATIVE NEGATIVE mg/dL   Protein, ur NEGATIVE NEGATIVE mg/dL   Urobilinogen, UA 1.0 0.0 - 1.0 mg/dL    Nitrite NEGATIVE NEGATIVE   Leukocytes, UA NEGATIVE NEGATIVE    Comment: MICROSCOPIC NOT DONE ON URINES WITH NEGATIVE PROTEIN, BLOOD, LEUKOCYTES, NITRITE, OR GLUCOSE <1000 mg/dL.  Urine rapid drug screen (hosp performed) Status: Abnormal   Collection Time: 07/16/14 10:57 PM  Result Value Ref Range   Opiates NONE DETECTED NONE DETECTED   Cocaine POSITIVE (A) NONE DETECTED   Benzodiazepines NONE DETECTED NONE DETECTED   Amphetamines NONE DETECTED NONE DETECTED   Tetrahydrocannabinol NONE DETECTED NONE DETECTED   Barbiturates NONE DETECTED NONE DETECTED    Comment:   DRUG SCREEN FOR MEDICAL PURPOSES ONLY. IF CONFIRMATION IS NEEDED FOR ANY PURPOSE, NOTIFY LAB WITHIN 5 DAYS.   LOWEST DETECTABLE LIMITS FOR URINE DRUG SCREEN Drug Class Cutoff (ng/mL) Amphetamine 1000 Barbiturate 200 Benzodiazepine 482 Tricyclics 707 Opiates 867 Cocaine 300 THC 50   CBC with Differential/Platelet Status: Abnormal   Collection Time: 07/16/14 10:59 PM  Result Value Ref Range   WBC 6.7 4.0 - 10.5 K/uL   RBC 4.29 4.22 - 5.81 MIL/uL   Hemoglobin 12.7 (L) 13.0 - 17.0 g/dL   HCT 37.1 (L) 39.0 - 52.0 %   MCV 86.5 78.0 - 100.0 fL   MCH 29.6 26.0 - 34.0 pg   MCHC 34.2 30.0 - 36.0 g/dL   RDW 13.5 11.5 - 15.5 %   Platelets 144 (L) 150 - 400 K/uL   Neutrophils Relative % 70 43 - 77 %   Neutro Abs 4.7 1.7 - 7.7 K/uL   Lymphocytes Relative 23 12 - 46 %   Lymphs Abs 1.5 0.7 - 4.0 K/uL   Monocytes Relative 5 3 - 12 %   Monocytes Absolute 0.3 0.1 - 1.0 K/uL   Eosinophils Relative 2 0 - 5 %   Eosinophils Absolute 0.2 0.0 - 0.7 K/uL   Basophils Relative 0 0 - 1 %   Basophils Absolute 0.0 0.0 - 0.1 K/uL  Comprehensive metabolic panel Status: Abnormal   Collection Time: 07/16/14 10:59 PM  Result Value  Ref Range   Sodium 140 135 - 145 mmol/L   Potassium 4.1 3.5 - 5.1 mmol/L   Chloride 108 96 - 112 mmol/L   CO2 27 19 - 32 mmol/L   Glucose, Bld 104 (H) 70 - 99 mg/dL   BUN 17 6 - 23 mg/dL   Creatinine, Ser 1.47 (H) 0.50 - 1.35 mg/dL   Calcium 9.2 8.4 - 10.5 mg/dL   Total Protein 7.1 6.0 - 8.3 g/dL   Albumin 3.4 (L) 3.5 - 5.2 g/dL   AST 36 0 - 37 U/L   ALT 30 0 - 53 U/L   Alkaline Phosphatase 72 39 - 117 U/L   Total Bilirubin 0.6 0.3 - 1.2 mg/dL   GFR calc non Af Amer 48 (L) >90 mL/min   GFR calc Af Amer 56 (L) >90 mL/min    Comment: (NOTE) The eGFR has been calculated using the CKD EPI equation. This calculation has not been validated in all clinical situations. eGFR's persistently <90 mL/min signify  possible Chronic Kidney Disease.    Anion gap 5 5 - 15  Troponin I Status: None   Collection Time: 07/16/14 10:59 PM  Result Value Ref Range   Troponin I <0.03 <0.031 ng/mL    Comment:   NO INDICATION OF MYOCARDIAL INJURY.   I-Stat Chem 8, ED Status: Abnormal   Collection Time: 07/16/14 11:24 PM  Result Value Ref Range   Sodium 143 135 - 145 mmol/L   Potassium 4.1 3.5 - 5.1 mmol/L   Chloride 107 96 - 112 mmol/L   BUN 18 6 - 23 mg/dL   Creatinine, Ser 1.40 (H) 0.50 - 1.35 mg/dL   Glucose, Bld 104 (H) 70 - 99 mg/dL   Calcium, Ion 1.22 1.13 - 1.30 mmol/L   TCO2 21 0 - 100 mmol/L   Hemoglobin 13.3 13.0 - 17.0 g/dL   HCT 39.0 39.0 - 52.0 %  Type and screen Status: None   Collection Time: 07/17/14 1:51 AM  Result Value Ref Range   ABO/RH(D) B POS    Antibody Screen NEG    Sample Expiration 07/20/2014       Imaging Results (Last 48 hours)    Dg Chest Portable 1 View  07/16/2014 CLINICAL DATA: Chest pain. EXAM: PORTABLE CHEST - 1 VIEW COMPARISON: None. FINDINGS: Enlarged aorta, particularly the arch  and descending aorta. There is cardiopericardial enlargement as well. Indistinct left diaphragm, likely from the cardiomegaly; no definitive effusion. No failure or pneumonia. No pneumothorax. Critical Value/emergent results were called by telephone at the time of interpretation on 07/16/2014 at 11:15 pm to Dr. Juleen China, who verbally acknowledged these results. IMPRESSION: Abnormal aorta, at least aneurysmal. In the setting of chest pain, recommend CTA to evaluate for dissection or mediastinal hematoma. Electronically Signed By: Monte Fantasia M.D. On: 07/16/2014 23:16   Ct Angio Chest Aorta W/cm &/or Wo/cm  07/17/2014 CLINICAL DATA: Acute onset of mid chest pain for 3 hours. Pain when taking deep breaths. Initial encounter. EXAM: CT ANGIOGRAPHY CHEST, ABDOMEN AND PELVIS TECHNIQUE: Multidetector CT imaging through the chest, abdomen and pelvis was performed using the standard protocol during bolus administration of intravenous contrast. Multiplanar reconstructed images and MIPs were obtained and reviewed to evaluate the vascular anatomy. CONTRAST: 161m OMNIPAQUE IOHEXOL 350 MG/ML SOLN COMPARISON: Chest radiograph performed 07/16/2014 FINDINGS: CTA CHEST FINDINGS There is evidence of dissection and thrombosis along the ascending and descending thoracic aorta on noncontrast images, with associated moderate to large amount of mediastinal hemorrhage. The dominant mediastinal collection measures 7.9 x 4.9 cm. A small amount of blood is seen tracking into the left pleural space. On noncontrast images, there is dilatation of the ascending thoracic aorta to 4.7 cm in maximal diameter, and dilatation of the proximal descending thoracic aorta to 4.8 cm in maximal diameter. This includes the underlying thrombosis. The patent lumen of the thoracic aorta measures 4.4 cm along the ascending thoracic aorta, and 3.8 cm along the proximal descending thoracic aorta. Mild bilateral atelectasis is noted. No  pneumothorax is seen. No masses are identified. There is rightward deviation of the trachea due to the dilatation of the aortic arch and predominantly left-sided mediastinal hemorrhage. Evaluation for mediastinal lymphadenopathy is limited given mediastinal hemorrhage. The great vessels appear grossly intact. The thyroid gland is unremarkable in appearance. No axillary lymphadenopathy is seen. Mild bilateral gynecomastia is noted. No acute osseous abnormalities are seen. Review of the MIP images confirms the above findings. CTA ABDOMEN AND PELVIS FINDINGS The abdominal aorta is unremarkable in appearance, aside from  mild scattered calcification along the distal abdominal aorta and its branches, extending into both lower extremities. There is no evidence of dissection. The celiac trunk, superior mesenteric artery, bilateral renal arteries and inferior mesenteric artery appear patent. The IVC is unremarkable in appearance. Scattered hypodensities are seen within the liver, nonspecific in appearance. The spleen is unremarkable in appearance. The gallbladder is flattened along the diaphragm and difficult to fully assess. The pancreas and adrenal glands are unremarkable. Scattered right renal cysts are seen, measuring up to 3.9 cm in size. The left kidney is unremarkable in appearance. No significant perinephric stranding is seen. There is no evidence of hydronephrosis. No renal or ureteral stones are seen. No free fluid is identified. The small bowel is unremarkable in appearance. The stomach is within normal limits. No acute vascular abnormalities are seen. The appendix is normal in caliber and contains air, without evidence for appendicitis. Apparent wall thickening along the mid to distal sigmoid colon may simply reflect intraluminal contents, though sigmoidoscopy could be considered for further evaluation, when and as deemed clinically appropriate. The bladder is mildly distended and grossly unremarkable.  The prostate remains normal in size. No inguinal lymphadenopathy is seen. No acute osseous abnormalities are identified. Degenerative change is noted at the hip joints bilaterally, with diffuse sclerosis and bony remodeling. Facet disease is noted along the lower thoracic and lumbar spine. Review of the MIP images confirms the above findings. IMPRESSION: 1. 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 The dominant mediastinal hematoma measures 7.9 x 4.9 cm. Small amount of blood noted tracking into the left pleural space. 2. Dilatation of the ascending thoracic aorta to 4.7 cm in maximal diameter, and dilatation of the descending thoracic aorta to 4.8 cm in maximal diameter. The patent lumen measures 4.4 cm at the ascending thoracic aorta, and 3.8 cm along the proximal descending thoracic aorta. 3. No dissection flap seen more distally. The abdominal aorta is unremarkable in appearance, aside from mild scattered calcification along the distal abdominal aorta and its branches, extending to both lower extremities. 4. Rightward deviation of the trachea reflects dilatation of the aortic arch and left-sided mediastinal hemorrhage. 5. Apparent wall thickening along the mid to distal sigmoid colon may simply reflect intraluminal contents, though sigmoidoscopy could be considered for further evaluation, when and as deemed clinically appropriate, to exclude mass. 6. Mild bilateral atelectasis noted. 7. Mild bilateral gynecomastia seen. 8. Scattered nonspecific hypodensities in the liver; scattered right renal cysts seen. 9. Degenerative change noted at the hip joints bilaterally, with diffuse sclerosis and bony remodeling. Critical Value/emergent results were called by telephone at the time of interpretation on 07/17/2014 at 1:31 am to Dr. Maggie Schwalbe , who verbally acknowledged these results.  Electronically Signed By: Garald Balding M.D. On: 07/17/2014 01:59   Ct Angio Abd/pel W/ And/or W/o  07/17/2014 CLINICAL DATA: Acute onset of mid chest pain for 3 hours. Pain when taking deep breaths. Initial encounter. EXAM: CT ANGIOGRAPHY CHEST, ABDOMEN AND PELVIS TECHNIQUE: Multidetector CT imaging through the chest, abdomen and pelvis was performed using the standard protocol during bolus administration of intravenous contrast. Multiplanar reconstructed images and MIPs were obtained and reviewed to evaluate the vascular anatomy. CONTRAST: 143m OMNIPAQUE IOHEXOL 350 MG/ML SOLN COMPARISON: Chest radiograph performed 07/16/2014 FINDINGS: CTA CHEST FINDINGS There is evidence of dissection and thrombosis along the ascending and descending thoracic aorta on noncontrast images, with associated moderate to large amount of mediastinal  hemorrhage. The dominant mediastinal collection measures 7.9 x 4.9 cm. A small amount of blood is seen tracking into the left pleural space. On noncontrast images, there is dilatation of the ascending thoracic aorta to 4.7 cm in maximal diameter, and dilatation of the proximal descending thoracic aorta to 4.8 cm in maximal diameter. This includes the underlying thrombosis. The patent lumen of the thoracic aorta measures 4.4 cm along the ascending thoracic aorta, and 3.8 cm along the proximal descending thoracic aorta. Mild bilateral atelectasis is noted. No pneumothorax is seen. No masses are identified. There is rightward deviation of the trachea due to the dilatation of the aortic arch and predominantly left-sided mediastinal hemorrhage. Evaluation for mediastinal lymphadenopathy is limited given mediastinal hemorrhage. The great vessels appear grossly intact. The thyroid gland is unremarkable in appearance. No axillary lymphadenopathy is seen. Mild bilateral gynecomastia is noted. No acute osseous abnormalities are seen. Review of the MIP images confirms the above  findings. CTA ABDOMEN AND PELVIS FINDINGS The abdominal aorta is unremarkable in appearance, aside from mild scattered calcification along the distal abdominal aorta and its branches, extending into both lower extremities. There is no evidence of dissection. The celiac trunk, superior mesenteric artery, bilateral renal arteries and inferior mesenteric artery appear patent. The IVC is unremarkable in appearance. Scattered hypodensities are seen within the liver, nonspecific in appearance. The spleen is unremarkable in appearance. The gallbladder is flattened along the diaphragm and difficult to fully assess. The pancreas and adrenal glands are unremarkable. Scattered right renal cysts are seen, measuring up to 3.9 cm in size. The left kidney is unremarkable in appearance. No significant perinephric stranding is seen. There is no evidence of hydronephrosis. No renal or ureteral stones are seen. No free fluid is identified. The small bowel is unremarkable in appearance. The stomach is within normal limits. No acute vascular abnormalities are seen. The appendix is normal in caliber and contains air, without evidence for appendicitis. Apparent wall thickening along the mid to distal sigmoid colon may simply reflect intraluminal contents, though sigmoidoscopy could be considered for further evaluation, when and as deemed clinically appropriate. The bladder is mildly distended and grossly unremarkable. The prostate remains normal in size. No inguinal lymphadenopathy is seen. No acute osseous abnormalities are identified. Degenerative change is noted at the hip joints bilaterally, with diffuse sclerosis and bony remodeling. Facet disease is noted along the lower thoracic and lumbar spine. Review of the MIP images confirms the above findings. IMPRESSION: 1. 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 The  dominant mediastinal hematoma measures 7.9 x 4.9 cm. Small amount of blood noted tracking into the left pleural space. 2. Dilatation of the ascending thoracic aorta to 4.7 cm in maximal diameter, and dilatation of the descending thoracic aorta to 4.8 cm in maximal diameter. The patent lumen measures 4.4 cm at the ascending thoracic aorta, and 3.8 cm along the proximal descending thoracic aorta. 3. No dissection flap seen more distally. The abdominal aorta is unremarkable in appearance, aside from mild scattered calcification along the distal abdominal aorta and its branches, extending to both lower extremities. 4. Rightward deviation of the trachea reflects dilatation of the aortic arch and left-sided mediastinal hemorrhage. 5. Apparent wall thickening along the mid to distal sigmoid colon may simply reflect intraluminal contents, though sigmoidoscopy could be considered for further evaluation, when and as deemed clinically appropriate, to exclude mass. 6. Mild bilateral atelectasis noted. 7. Mild bilateral  gynecomastia seen. 8. Scattered nonspecific hypodensities in the liver; scattered right renal cysts seen. 9. Degenerative change noted at the hip joints bilaterally, with diffuse sclerosis and bony remodeling. Critical Value/emergent results were called by telephone at the time of interpretation on 07/17/2014 at 1:31 am to Dr. Maggie Schwalbe , who verbally acknowledged these results. Electronically Signed By: Garald Balding M.D. On: 07/17/2014 01:59     Review of Systems  Constitutional: Negative for fever, chills, weight loss, malaise/fatigue and diaphoresis.  HENT: Negative.  Eyes: Negative.  Respiratory: Negative for shortness of breath.  Cardiovascular: No further chest or back pain. Negative for palpitations, orthopnea, claudication, leg swelling and PND.  Gastrointestinal: Negative.  Genitourinary: Negative.  Musculoskeletal: Positive for joint pain.   Both hips that is  chronic Skin: Negative.  Neurological: Negative for dizziness, tingling, sensory change, focal weakness, seizures, loss of consciousness and weakness.  Endo/Heme/Allergies: Negative.  Psychiatric/Behavioral: Negative.    Blood pressure 143/91, pulse 85, temperature 98.1 F (36.7 C), resp. rate 23, height '5\' 10"'  (1.778 m), weight 97.523 kg (215 lb), SpO2 100 %. Physical Exam  Constitutional: He is oriented to person, place, and time. He appears well-developed and well-nourished. No distress.  Short stature  HENT:  Head: Atraumatic.  Mouth/Throat: Oropharynx is clear and moist.  Eyes: Conjunctivae and EOM are normal. Pupils are equal, round, and reactive to light.  Neck: Normal range of motion. Neck supple. No JVD present. No tracheal deviation present. No thyromegaly present.  Cardiovascular: Normal rate, regular rhythm, normal heart sounds and intact distal pulses. Exam reveals no gallop and no friction rub.  No murmur heard. Both radial, femoral, dp, and pt pulses strong and equal  Respiratory: Effort normal and breath sounds normal. No respiratory distress. He has no wheezes. He has no rales. He exhibits no tenderness.  GI: Soft. Bowel sounds are normal. He exhibits no distension and no mass. There is no tenderness.  Musculoskeletal: Normal range of motion. He exhibits no edema or tenderness.  Lymphadenopathy:   He has no cervical adenopathy.  Neurological: He is alert and oriented to person, place, and time. He has normal strength. No cranial nerve deficit or sensory deficit.  Skin: Skin is warm and dry. He is not diaphoretic.        Diagnostic Studies:   CLINICAL DATA: Recent acute aortic syndrome with intramural hematoma and associated mediastinal hemorrhage. Followup CTA is performed to evaluate the aorta.  EXAM: CT ANGIOGRAPHY CHEST AND ABDOMEN  TECHNIQUE: Multidetector CT imaging of the chest and abdomen was performed using the standard protocol during  bolus administration of intravenous contrast. Multiplanar CT image reconstructions and MIPs were obtained to evaluate the vascular anatomy.  CONTRAST: 166m OMNIPAQUE IOHEXOL 350 MG/ML SOLN  COMPARISON: CTA studies on 07/21/2014 and 07/17/2014.  FINDINGS: CTA CHEST FINDINGS  Progressive abnormality involving the ascending thoracic aorta and extending up nearly to the level of the aortic isthmus and proximal arch is consistent with worsening type A dissection with formation of penetrating ulcers at the level of previous intramural hemorrhage. The aorta shows progressive dilatation with maximal diameter of 5.8 cm. The sinuses of Valsalva are not involved by dissection. At the level of the ascending thoracic aorta, there is evidence of anterior dissection with formation of distinct areas of contrast filled penetrating ulcers extending into the aortic wall anteriorly. One of these measures roughly 1.4 x 2.8 cm and a second roughly 1.4 x 2.2 cm. As the ascending aorta is followed superiorly, a more discrete level of  dissection is identified anteriorly and extending up to the proximal arch. Component of false lumen and or penetrating ulcer at this level measures approximately 1.6 x 4.2 cm. There is no involvement of the great vessel origins by dissection.  Mediastinal hemorrhage has nearly completely resolved with some residual stranding present in the superior mediastinum. The component of intramural hemorrhage involving the distal arch and descending thoracic aorta has also nearly completely resolved. The proximal arch measures 4.0 cm. The distal arch measures 3.8 cm. The descending thoracic aorta measures 3.6 cm.  The heart size is normal. There is a trace amount of pericardial fluid. Pulmonary arteries are also well opacified and show resolution of previously noted nonocclusive left lower lobe pulmonary embolism. Scattered areas of pulmonary parenchymal scarring and  atelectasis present without evidence of consolidation, edema, pleural effusion or pneumothorax. Mild thoracic spondylosis present.  Review of the MIP images confirms the above findings.  CTA ABDOMEN FINDINGS  The abdominal aorta is of normal caliber with maximal diameter of 3.0 x 3.2 cm just above the celiac axis. The infrarenal aorta measures 2.3 cm. Visceral arteries show normal patency including the celiac axis, superior mesenteric artery, bilateral single renal arteries and the inferior mesenteric artery. The common iliac arteries are widely patent bilaterally without aneurysmal disease.  An IVC filter is present with positioning in the infrarenal IVC and normal orientation. No visible solid organ abnormalities during arterial phase of imaging. Renal cyst on the right measures 3.7 cm and shows benign internal fluid density. No incidental masses or enlarged lymph nodes are seen. No hernias are identified. Mild spondylosis is seen throughout the visualized spine.  Review of the MIP images confirms the above findings.  IMPRESSION: 1. Progressive abnormality involving the ascending thoracic aorta with multiple areas of penetrating ulcer disease now identified into the anterior aortic wall with a more focal true dissection seen in the distal ascending aorta. Maximal diameter of the ascending thoracic aorta is now 5.8 cm. Dissection and dilatation does not involve the sinuses of Valsalva. 2. Near resolution of mediastinal hemorrhage since prior imaging with no evidence of new hemorrhage. 3. Improved appearance of intramural hemorrhage involving the aortic arch and proximal descending thoracic aorta without progressive distal component of dissection or penetrating ulcer disease. 4. Resolution of nonocclusive left lower lobe pulmonary embolism. 5. No acute findings involving the aorta or its branches in the abdomen. Critical Value/emergent results were called by telephone at  the time of interpretation on 08/18/2014 at 10:56 AM to Dr. Gilford Raid , who verbally acknowledged these results.   Electronically Signed  By: Aletta Edouard M.D.  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 This extends up to the proximal arch but does not appear to involve the aortic root. The proximal coronaries have minimal calcification and fill with contrast so I doubt that he has significant proximal coronary disease. I don't thin a cath would be wise with the pseudoaneurysm formation in the ascending aorta. A nuclear stress test would not be useful because even if there is an abnormality I don't think a cath could be done safely. This will require surgical replacement of the ascending aorta and part or all of the aortic arch. Hopefully the aortic root can remain. His aortic valve was normal on echo. I discussed the operative procedure with the patient and his girlfriend including alternatives, benefits and risks; including but not limited to bleeding, blood transfusion, infection, stroke,  myocardial infarction, heart block requiring a permanent pacemaker, organ dysfunction, and death. Ardyth Man understands and agrees to proceed.   Plan:  Replacement of the ascending aorta and part of all of the aortic arch using circulatory arrest and right axillary cannulation.   [name], MD [facility] [phone]

## 2014-08-27 ENCOUNTER — Encounter (HOSPITAL_COMMUNITY)
Admission: RE | Disposition: A | Discharge status: Home / Self Care | Payer: Medicare Other | Source: Ambulatory Visit | Attending: Surgery

## 2014-08-27 ENCOUNTER — Encounter (HOSPITAL_COMMUNITY): Payer: Self-pay | Admitting: *Deleted

## 2014-08-27 ENCOUNTER — Inpatient Hospital Stay (HOSPITAL_COMMUNITY): Payer: Medicare Other | Admitting: Certified Registered Nurse Anesthetist

## 2014-08-27 ENCOUNTER — Inpatient Hospital Stay (HOSPITAL_COMMUNITY): Payer: Medicare Other

## 2014-08-27 DIAGNOSIS — K045 Chronic apical periodontitis: Secondary | ICD-10-CM | POA: Diagnosis not present

## 2014-08-27 DIAGNOSIS — I10 Essential (primary) hypertension: Secondary | ICD-10-CM | POA: Diagnosis present

## 2014-08-27 DIAGNOSIS — I708 Atherosclerosis of other arteries: Secondary | ICD-10-CM | POA: Diagnosis not present

## 2014-08-27 DIAGNOSIS — Z86718 Personal history of other venous thrombosis and embolism: Secondary | ICD-10-CM | POA: Diagnosis not present

## 2014-08-27 DIAGNOSIS — I509 Heart failure, unspecified: Secondary | ICD-10-CM | POA: Diagnosis not present

## 2014-08-27 DIAGNOSIS — I719 Aortic aneurysm of unspecified site, without rupture: Secondary | ICD-10-CM | POA: Diagnosis not present

## 2014-08-27 DIAGNOSIS — Z9889 Other specified postprocedural states: Secondary | ICD-10-CM | POA: Diagnosis not present

## 2014-08-27 DIAGNOSIS — J986 Disorders of diaphragm: Secondary | ICD-10-CM | POA: Diagnosis not present

## 2014-08-27 DIAGNOSIS — Z86711 Personal history of pulmonary embolism: Secondary | ICD-10-CM | POA: Diagnosis not present

## 2014-08-27 DIAGNOSIS — D649 Anemia, unspecified: Secondary | ICD-10-CM | POA: Diagnosis present

## 2014-08-27 DIAGNOSIS — I7101 Dissection of thoracic aorta: Secondary | ICD-10-CM | POA: Diagnosis present

## 2014-08-27 DIAGNOSIS — J9811 Atelectasis: Secondary | ICD-10-CM | POA: Diagnosis present

## 2014-08-27 DIAGNOSIS — K029 Dental caries, unspecified: Secondary | ICD-10-CM | POA: Diagnosis present

## 2014-08-27 DIAGNOSIS — K047 Periapical abscess without sinus: Secondary | ICD-10-CM | POA: Diagnosis not present

## 2014-08-27 DIAGNOSIS — K0530 Chronic periodontitis, unspecified: Secondary | ICD-10-CM | POA: Diagnosis not present

## 2014-08-27 DIAGNOSIS — Z952 Presence of prosthetic heart valve: Secondary | ICD-10-CM | POA: Diagnosis not present

## 2014-08-27 DIAGNOSIS — M264 Malocclusion, unspecified: Secondary | ICD-10-CM | POA: Diagnosis present

## 2014-08-27 DIAGNOSIS — K040 Pulpitis: Secondary | ICD-10-CM | POA: Diagnosis present

## 2014-08-27 DIAGNOSIS — M160 Bilateral primary osteoarthritis of hip: Secondary | ICD-10-CM | POA: Diagnosis present

## 2014-08-27 DIAGNOSIS — F17210 Nicotine dependence, cigarettes, uncomplicated: Secondary | ICD-10-CM | POA: Diagnosis present

## 2014-08-27 DIAGNOSIS — I712 Thoracic aortic aneurysm, without rupture: Secondary | ICD-10-CM | POA: Diagnosis not present

## 2014-08-27 DIAGNOSIS — I713 Abdominal aortic aneurysm, ruptured: Secondary | ICD-10-CM | POA: Diagnosis not present

## 2014-08-27 DIAGNOSIS — M009 Pyogenic arthritis, unspecified: Secondary | ICD-10-CM | POA: Diagnosis present

## 2014-08-27 DIAGNOSIS — Z95828 Presence of other vascular implants and grafts: Secondary | ICD-10-CM

## 2014-08-27 DIAGNOSIS — R079 Chest pain, unspecified: Secondary | ICD-10-CM | POA: Diagnosis not present

## 2014-08-27 HISTORY — PX: REPLACEMENT ASCENDING AORTA: SHX6068

## 2014-08-27 HISTORY — PX: CANNULATION FOR CARDIOPULMONARY BYPASS: SHX6411

## 2014-08-27 HISTORY — PX: TEE WITHOUT CARDIOVERSION: SHX5443

## 2014-08-27 LAB — POCT I-STAT, CHEM 8
BUN: 14 mg/dL (ref 6–23)
BUN: 13 mg/dL (ref 6–23)
BUN: 12 mg/dL (ref 6–23)
BUN: 14 mg/dL (ref 6–23)
BUN: 14 mg/dL (ref 6–23)
BUN: 14 mg/dL (ref 6–23)
BUN: 15 mg/dL (ref 6–23)
CALCIUM ION: 1.25 mmol/L (ref 1.13–1.30)
CALCIUM ION: 1.15 mmol/L (ref 1.13–1.30)
CHLORIDE: 103 mmol/L (ref 96–112)
CHLORIDE: 103 mmol/L (ref 96–112)
CREATININE: 1 mg/dL (ref 0.50–1.35)
CREATININE: 1.1 mg/dL (ref 0.50–1.35)
Calcium, Ion: 1.26 mmol/L (ref 1.13–1.30)
Calcium, Ion: 1.08 mmol/L — ABNORMAL LOW (ref 1.13–1.30)
Calcium, Ion: 1.13 mmol/L (ref 1.13–1.30)
Calcium, Ion: 1.1 mmol/L — ABNORMAL LOW (ref 1.13–1.30)
Calcium, Ion: 1.22 mmol/L (ref 1.13–1.30)
Chloride: 104 mmol/L (ref 96–112)
Chloride: 105 mmol/L (ref 96–112)
Chloride: 103 mmol/L (ref 96–112)
Chloride: 103 mmol/L (ref 96–112)
Chloride: 108 mmol/L (ref 96–112)
Creatinine, Ser: 1 mg/dL (ref 0.50–1.35)
Creatinine, Ser: 0.9 mg/dL (ref 0.50–1.35)
Creatinine, Ser: 0.9 mg/dL (ref 0.50–1.35)
Creatinine, Ser: 0.9 mg/dL (ref 0.50–1.35)
Creatinine, Ser: 1 mg/dL (ref 0.50–1.35)
GLUCOSE: 100 mg/dL — AB (ref 70–99)
GLUCOSE: 118 mg/dL — AB (ref 70–99)
Glucose, Bld: 107 mg/dL — ABNORMAL HIGH (ref 70–99)
Glucose, Bld: 99 mg/dL (ref 70–99)
Glucose, Bld: 110 mg/dL — ABNORMAL HIGH (ref 70–99)
Glucose, Bld: 164 mg/dL — ABNORMAL HIGH (ref 70–99)
Glucose, Bld: 114 mg/dL — ABNORMAL HIGH (ref 70–99)
HCT: 25 % — ABNORMAL LOW (ref 39.0–52.0)
HCT: 30 % — ABNORMAL LOW (ref 39.0–52.0)
HEMATOCRIT: 28 % — AB (ref 39.0–52.0)
HEMATOCRIT: 28 % — AB (ref 39.0–52.0)
HEMATOCRIT: 23 % — AB (ref 39.0–52.0)
HEMATOCRIT: 21 % — AB (ref 39.0–52.0)
HEMATOCRIT: 24 % — AB (ref 39.0–52.0)
HEMOGLOBIN: 9.5 g/dL — AB (ref 13.0–17.0)
HEMOGLOBIN: 7.1 g/dL — AB (ref 13.0–17.0)
HEMOGLOBIN: 8.2 g/dL — AB (ref 13.0–17.0)
Hemoglobin: 9.5 g/dL — ABNORMAL LOW (ref 13.0–17.0)
Hemoglobin: 7.8 g/dL — ABNORMAL LOW (ref 13.0–17.0)
Hemoglobin: 8.5 g/dL — ABNORMAL LOW (ref 13.0–17.0)
Hemoglobin: 10.2 g/dL — ABNORMAL LOW (ref 13.0–17.0)
POTASSIUM: 4.3 mmol/L (ref 3.5–5.1)
POTASSIUM: 3.9 mmol/L (ref 3.5–5.1)
POTASSIUM: 4.8 mmol/L (ref 3.5–5.1)
POTASSIUM: 5 mmol/L (ref 3.5–5.1)
Potassium: 4.3 mmol/L (ref 3.5–5.1)
Potassium: 4.7 mmol/L (ref 3.5–5.1)
Potassium: 5 mmol/L (ref 3.5–5.1)
SODIUM: 141 mmol/L (ref 135–145)
SODIUM: 138 mmol/L (ref 135–145)
SODIUM: 139 mmol/L (ref 135–145)
Sodium: 140 mmol/L (ref 135–145)
Sodium: 140 mmol/L (ref 135–145)
Sodium: 136 mmol/L (ref 135–145)
Sodium: 142 mmol/L (ref 135–145)
TCO2: 23 mmol/L (ref 0–100)
TCO2: 22 mmol/L (ref 0–100)
TCO2: 23 mmol/L (ref 0–100)
TCO2: 22 mmol/L (ref 0–100)
TCO2: 22 mmol/L (ref 0–100)
TCO2: 21 mmol/L (ref 0–100)
TCO2: 19 mmol/L (ref 0–100)

## 2014-08-27 LAB — MAGNESIUM: Magnesium: 3.3 mg/dL — ABNORMAL HIGH (ref 1.5–2.5)

## 2014-08-27 LAB — CBC
HCT: 28 % — ABNORMAL LOW (ref 39.0–52.0)
HEMATOCRIT: 28.9 % — AB (ref 39.0–52.0)
HEMOGLOBIN: 9.6 g/dL — AB (ref 13.0–17.0)
Hemoglobin: 9.2 g/dL — ABNORMAL LOW (ref 13.0–17.0)
MCH: 28.1 pg (ref 26.0–34.0)
MCH: 28 pg (ref 26.0–34.0)
MCHC: 32.9 g/dL (ref 30.0–36.0)
MCHC: 33.2 g/dL (ref 30.0–36.0)
MCV: 85.6 fL (ref 78.0–100.0)
MCV: 84.3 fL (ref 78.0–100.0)
Platelets: 115 10*3/uL — ABNORMAL LOW (ref 150–400)
Platelets: 127 10*3/uL — ABNORMAL LOW (ref 150–400)
RBC: 3.27 MIL/uL — ABNORMAL LOW (ref 4.22–5.81)
RBC: 3.43 MIL/uL — ABNORMAL LOW (ref 4.22–5.81)
RDW: 14.8 % (ref 11.5–15.5)
RDW: 14.8 % (ref 11.5–15.5)
WBC: 6.6 10*3/uL (ref 4.0–10.5)
WBC: 6.1 10*3/uL (ref 4.0–10.5)

## 2014-08-27 LAB — POCT I-STAT 3, ART BLOOD GAS (G3+)
Acid-Base Excess: 1 mmol/L (ref 0.0–2.0)
Acid-base deficit: 4 mmol/L — ABNORMAL HIGH (ref 0.0–2.0)
Bicarbonate: 25.4 mEq/L — ABNORMAL HIGH (ref 20.0–24.0)
Bicarbonate: 26 mEq/L — ABNORMAL HIGH (ref 20.0–24.0)
Bicarbonate: 21.1 mEq/L (ref 20.0–24.0)
O2 SAT: 100 %
O2 Saturation: 100 %
O2 Saturation: 94 %
PCO2 ART: 43.6 mmHg (ref 35.0–45.0)
PCO2 ART: 38.7 mmHg (ref 35.0–45.0)
PH ART: 7.38 (ref 7.350–7.450)
TCO2: 27 mmol/L (ref 0–100)
TCO2: 27 mmol/L (ref 0–100)
TCO2: 22 mmol/L (ref 0–100)
pCO2 arterial: 43 mmHg (ref 35.0–45.0)
pH, Arterial: 7.384 (ref 7.350–7.450)
pH, Arterial: 7.344 — ABNORMAL LOW (ref 7.350–7.450)
pO2, Arterial: 324 mmHg — ABNORMAL HIGH (ref 80.0–100.0)
pO2, Arterial: 365 mmHg — ABNORMAL HIGH (ref 80.0–100.0)
pO2, Arterial: 76 mmHg — ABNORMAL LOW (ref 80.0–100.0)

## 2014-08-27 LAB — GLUCOSE, CAPILLARY: GLUCOSE-CAPILLARY: 104 mg/dL — AB (ref 70–99)

## 2014-08-27 LAB — PREPARE RBC (CROSSMATCH)

## 2014-08-27 LAB — PLATELET COUNT: Platelets: 122 10*3/uL — ABNORMAL LOW (ref 150–400)

## 2014-08-27 LAB — CREATININE, SERUM
CREATININE: 1.21 mg/dL (ref 0.50–1.35)
GFR, EST AFRICAN AMERICAN: 70 mL/min — AB (ref >90)
GFR, EST NON AFRICAN AMERICAN: 61 mL/min — AB (ref >90)

## 2014-08-27 LAB — APTT: aPTT: 36 seconds (ref 24–37)

## 2014-08-27 LAB — POCT I-STAT 4, (NA,K, GLUC, HGB,HCT)
Glucose, Bld: 167 mg/dL — ABNORMAL HIGH (ref 70–99)
HEMATOCRIT: 29 % — AB (ref 39.0–52.0)
Hemoglobin: 9.9 g/dL — ABNORMAL LOW (ref 13.0–17.0)
Potassium: 4.6 mmol/L (ref 3.5–5.1)
SODIUM: 142 mmol/L (ref 135–145)

## 2014-08-27 LAB — HEMOGLOBIN AND HEMATOCRIT, BLOOD
HCT: 21.5 % — ABNORMAL LOW (ref 39.0–52.0)
Hemoglobin: 6.9 g/dL — CL (ref 13.0–17.0)

## 2014-08-27 LAB — PROTIME-INR
INR: 1.53 — ABNORMAL HIGH (ref 0.00–1.49)
Prothrombin Time: 18.6 seconds — ABNORMAL HIGH (ref 11.6–15.2)

## 2014-08-27 SURGERY — REPLACEMENT, AORTA, ASCENDING
Anesthesia: General

## 2014-08-27 MED ORDER — FENTANYL CITRATE 0.05 MG/ML IJ SOLN
INTRAMUSCULAR | Status: AC
  Filled 2014-08-27: qty 5

## 2014-08-27 MED ORDER — DOCUSATE SODIUM 100 MG PO CAPS
200.0000 mg | ORAL_CAPSULE | Freq: Every day | ORAL | Status: DC
  Administered 2014-08-28 – 2014-08-31 (×4): 200 mg via ORAL
  Filled 2014-08-27 (×4): qty 2

## 2014-08-27 MED ORDER — ACETAMINOPHEN 160 MG/5ML PO SOLN
1000.0000 mg | Freq: Four times a day (QID) | ORAL | Status: DC
Start: 2014-08-28 — End: 2014-08-31
  Administered 2014-08-27: 1000 mg
  Filled 2014-08-27: qty 40.6

## 2014-08-27 MED ORDER — HEMOSTATIC AGENTS (NO CHARGE) OPTIME
TOPICAL | Status: DC | PRN
  Administered 2014-08-27: 1 via TOPICAL

## 2014-08-27 MED ORDER — PROPOFOL 10 MG/ML IV BOLUS
INTRAVENOUS | Status: AC
  Filled 2014-08-27: qty 20

## 2014-08-27 MED ORDER — TRAMADOL HCL 50 MG PO TABS: 50.0000 mg | ORAL_TABLET | ORAL | Status: DC | PRN

## 2014-08-27 MED ORDER — ACETAMINOPHEN 500 MG PO TABS
1000.0000 mg | ORAL_TABLET | Freq: Four times a day (QID) | ORAL | Status: DC
  Administered 2014-08-28 – 2014-08-31 (×13): 1000 mg via ORAL
  Filled 2014-08-27 (×17): qty 2

## 2014-08-27 MED ORDER — HEPARIN SODIUM (PORCINE) 1000 UNIT/ML IJ SOLN
INTRAMUSCULAR | Status: AC
  Filled 2014-08-27: qty 1

## 2014-08-27 MED ORDER — PROTAMINE SULFATE 10 MG/ML IV SOLN
INTRAVENOUS | Status: DC | PRN
  Administered 2014-08-27: 280 mg via INTRAVENOUS

## 2014-08-27 MED ORDER — METHYLPREDNISOLONE SODIUM SUCC 125 MG IJ SOLR
INTRAMUSCULAR | Status: DC | PRN
  Administered 2014-08-27: 125 mg via INTRAVENOUS

## 2014-08-27 MED ORDER — METOPROLOL TARTRATE 12.5 MG HALF TABLET
12.5000 mg | ORAL_TABLET | Freq: Two times a day (BID) | ORAL | Status: DC
  Administered 2014-08-28 – 2014-08-31 (×7): 12.5 mg via ORAL
  Filled 2014-08-27 (×9): qty 1

## 2014-08-27 MED ORDER — SODIUM CHLORIDE 0.9 % IV SOLN
5.0000 g | INTRAVENOUS | Status: DC
  Filled 2014-08-27: qty 20

## 2014-08-27 MED ORDER — LACTATED RINGERS IV SOLN
INTRAVENOUS | Status: DC | PRN
Start: 2014-08-27 — End: 2014-08-27
  Administered 2014-08-27 (×2): via INTRAVENOUS

## 2014-08-27 MED ORDER — MIDAZOLAM HCL 2 MG/2ML IJ SOLN: 2.0000 mg | INTRAMUSCULAR | Status: DC | PRN

## 2014-08-27 MED ORDER — MIDAZOLAM HCL 2 MG/2ML IJ SOLN
INTRAMUSCULAR | Status: AC
  Filled 2014-08-27: qty 2

## 2014-08-27 MED ORDER — NITROGLYCERIN IN D5W 200-5 MCG/ML-% IV SOLN: 0.0000 ug/min | INTRAVENOUS | Status: DC

## 2014-08-27 MED ORDER — FENTANYL CITRATE 0.05 MG/ML IJ SOLN
INTRAMUSCULAR | Status: DC | PRN
Start: 2014-08-27 — End: 2014-08-27
  Administered 2014-08-27: 50 ug via INTRAVENOUS
  Administered 2014-08-27: 250 ug via INTRAVENOUS
  Administered 2014-08-27: 1150 ug via INTRAVENOUS
  Administered 2014-08-27: 150 ug via INTRAVENOUS
  Administered 2014-08-27: 100 ug via INTRAVENOUS

## 2014-08-27 MED ORDER — SODIUM CHLORIDE 0.9 % IJ SOLN
INTRAMUSCULAR | Status: AC
Start: 2014-08-27 — End: 2014-08-27
  Filled 2014-08-27: qty 20

## 2014-08-27 MED ORDER — ALBUMIN HUMAN 5 % IV SOLN
250.0000 mL | INTRAVENOUS | Status: AC | PRN
  Administered 2014-08-27 (×2): 250 mL via INTRAVENOUS

## 2014-08-27 MED ORDER — SODIUM CHLORIDE 0.9 % IV SOLN: 250.0000 mL | INTRAVENOUS | Status: DC

## 2014-08-27 MED ORDER — METOPROLOL TARTRATE 25 MG/10 ML ORAL SUSPENSION
12.5000 mg | Freq: Two times a day (BID) | ORAL | Status: DC
  Filled 2014-08-27 (×9): qty 5

## 2014-08-27 MED ORDER — ARTIFICIAL TEARS OP OINT
TOPICAL_OINTMENT | OPHTHALMIC | Status: AC
  Filled 2014-08-27: qty 3.5

## 2014-08-27 MED ORDER — PANTOPRAZOLE SODIUM 40 MG PO TBEC
40.0000 mg | DELAYED_RELEASE_TABLET | Freq: Every day | ORAL | Status: DC
  Administered 2014-08-29 – 2014-09-03 (×5): 40 mg via ORAL
  Filled 2014-08-27 (×5): qty 1

## 2014-08-27 MED ORDER — ROCURONIUM BROMIDE 50 MG/5ML IV SOLN
INTRAVENOUS | Status: AC
  Filled 2014-08-27: qty 2

## 2014-08-27 MED ORDER — THROMBIN 20000 UNITS EX SOLR
CUTANEOUS | Status: DC | PRN
  Administered 2014-08-27: 20000 [IU] via TOPICAL

## 2014-08-27 MED ORDER — PHENYLEPHRINE HCL 10 MG/ML IJ SOLN
0.0000 ug/min | INTRAMUSCULAR | Status: DC
  Filled 2014-08-27: qty 2

## 2014-08-27 MED ORDER — MAGNESIUM SULFATE 4 GM/100ML IV SOLN
4.0000 g | Freq: Once | INTRAVENOUS | Status: AC
  Administered 2014-08-27: 4 g via INTRAVENOUS
  Filled 2014-08-27: qty 100

## 2014-08-27 MED ORDER — THROMBIN 20000 UNITS EX SOLR
CUTANEOUS | Status: AC
  Filled 2014-08-27: qty 20000

## 2014-08-27 MED ORDER — PROPOFOL INFUSION 10 MG/ML OPTIME
INTRAVENOUS | Status: DC | PRN
  Administered 2014-08-27: 50 ug/kg/min via INTRAVENOUS

## 2014-08-27 MED ORDER — BISACODYL 10 MG RE SUPP: 10.0000 mg | Freq: Every day | RECTAL | Status: DC

## 2014-08-27 MED ORDER — LIDOCAINE HCL (CARDIAC) 20 MG/ML IV SOLN
INTRAVENOUS | Status: DC | PRN
  Administered 2014-08-27: 75 mg via INTRAVENOUS

## 2014-08-27 MED ORDER — ACETAMINOPHEN 160 MG/5ML PO SOLN: 650.0000 mg | Freq: Once | ORAL | Status: AC

## 2014-08-27 MED ORDER — LACTATED RINGERS IV SOLN
INTRAVENOUS | Status: DC | PRN
Start: 2014-08-27 — End: 2014-08-27
  Administered 2014-08-27 (×3): via INTRAVENOUS

## 2014-08-27 MED ORDER — OXYCODONE HCL 5 MG PO TABS
5.0000 mg | ORAL_TABLET | ORAL | Status: DC | PRN
  Administered 2014-08-28: 5 mg via ORAL
  Administered 2014-08-28 – 2014-08-29 (×3): 10 mg via ORAL
  Administered 2014-08-29 (×2): 5 mg via ORAL
  Administered 2014-08-29 – 2014-08-30 (×4): 10 mg via ORAL
  Administered 2014-08-30 – 2014-08-31 (×3): 5 mg via ORAL
  Administered 2014-08-31: 10 mg via ORAL
  Administered 2014-08-31 (×2): 5 mg via ORAL
  Administered 2014-09-01: 10 mg via ORAL
  Administered 2014-09-01: 5 mg via ORAL
  Administered 2014-09-01 – 2014-09-02 (×2): 10 mg via ORAL
  Administered 2014-09-02: 5 mg via ORAL
  Administered 2014-09-02 (×2): 10 mg via ORAL
  Administered 2014-09-03: 5 mg via ORAL
  Administered 2014-09-03: 10 mg via ORAL
  Filled 2014-08-27: qty 1
  Filled 2014-08-27 (×2): qty 2
  Filled 2014-08-27: qty 1
  Filled 2014-08-27: qty 2
  Filled 2014-08-27: qty 1
  Filled 2014-08-27 (×3): qty 2
  Filled 2014-08-27 (×2): qty 1
  Filled 2014-08-27 (×5): qty 2
  Filled 2014-08-27 (×2): qty 1
  Filled 2014-08-27 (×3): qty 2
  Filled 2014-08-27: qty 1
  Filled 2014-08-27 (×2): qty 2

## 2014-08-27 MED ORDER — MIDAZOLAM HCL 5 MG/5ML IJ SOLN
INTRAMUSCULAR | Status: DC | PRN
  Administered 2014-08-27: 2 mg via INTRAVENOUS
  Administered 2014-08-27: 3 mg via INTRAVENOUS
  Administered 2014-08-27: 2 mg via INTRAVENOUS
  Administered 2014-08-27: 1 mg via INTRAVENOUS
  Administered 2014-08-27 (×3): 2 mg via INTRAVENOUS

## 2014-08-27 MED ORDER — SODIUM CHLORIDE 0.9 % IJ SOLN
3.0000 mL | INTRAMUSCULAR | Status: DC | PRN
  Administered 2014-08-28: 3 mL via INTRAVENOUS
  Filled 2014-08-27: qty 3

## 2014-08-27 MED ORDER — BISACODYL 5 MG PO TBEC
10.0000 mg | DELAYED_RELEASE_TABLET | Freq: Every day | ORAL | Status: DC
  Administered 2014-08-28 – 2014-08-30 (×3): 10 mg via ORAL
  Filled 2014-08-27 (×3): qty 2

## 2014-08-27 MED ORDER — ARTIFICIAL TEARS OP OINT
TOPICAL_OINTMENT | OPHTHALMIC | Status: DC | PRN
  Administered 2014-08-27: 1 via OPHTHALMIC

## 2014-08-27 MED ORDER — ACETAMINOPHEN 650 MG RE SUPP
650.0000 mg | Freq: Once | RECTAL | Status: AC
  Administered 2014-08-27: 650 mg via RECTAL

## 2014-08-27 MED ORDER — STERILE WATER FOR INJECTION IJ SOLN
INTRAMUSCULAR | Status: AC
  Filled 2014-08-27: qty 20

## 2014-08-27 MED ORDER — LACTATED RINGERS IV SOLN: INTRAVENOUS | Status: DC

## 2014-08-27 MED ORDER — MORPHINE SULFATE 2 MG/ML IJ SOLN
1.0000 mg | INTRAMUSCULAR | Status: AC | PRN
  Administered 2014-08-27: 2 mg via INTRAVENOUS
  Filled 2014-08-27: qty 1

## 2014-08-27 MED ORDER — ASPIRIN EC 325 MG PO TBEC
325.0000 mg | DELAYED_RELEASE_TABLET | Freq: Every day | ORAL | Status: DC
  Administered 2014-08-28 – 2014-08-31 (×4): 325 mg via ORAL
  Filled 2014-08-27 (×4): qty 1

## 2014-08-27 MED ORDER — THROMBIN 20000 UNITS EX SOLR
OROMUCOSAL | Status: DC | PRN
  Administered 2014-08-27: 4 mL via TOPICAL

## 2014-08-27 MED ORDER — ROCURONIUM BROMIDE 100 MG/10ML IV SOLN
INTRAVENOUS | Status: DC | PRN
  Administered 2014-08-27: 100 mg via INTRAVENOUS

## 2014-08-27 MED ORDER — INSULIN REGULAR BOLUS VIA INFUSION
0.0000 [IU] | Freq: Three times a day (TID) | INTRAVENOUS | Status: DC
  Filled 2014-08-27: qty 10

## 2014-08-27 MED ORDER — DEXMEDETOMIDINE HCL IN NACL 200 MCG/50ML IV SOLN
0.0000 ug/kg/h | INTRAVENOUS | Status: DC
  Administered 2014-08-27: 0.7 ug/kg/h via INTRAVENOUS
  Filled 2014-08-27: qty 50

## 2014-08-27 MED ORDER — LIDOCAINE HCL (CARDIAC) 20 MG/ML IV SOLN
INTRAVENOUS | Status: AC
  Filled 2014-08-27: qty 5

## 2014-08-27 MED ORDER — SODIUM CHLORIDE 0.45 % IV SOLN
INTRAVENOUS | Status: DC | PRN
  Administered 2014-08-27: 16:00:00 via INTRAVENOUS

## 2014-08-27 MED ORDER — PROPOFOL 10 MG/ML IV BOLUS
INTRAVENOUS | Status: DC | PRN
  Administered 2014-08-27 (×2): 50 mg via INTRAVENOUS
  Administered 2014-08-27: 150 mg via INTRAVENOUS

## 2014-08-27 MED ORDER — METOCLOPRAMIDE HCL 5 MG/ML IJ SOLN
10.0000 mg | Freq: Four times a day (QID) | INTRAMUSCULAR | Status: AC
  Administered 2014-08-27 – 2014-08-28 (×4): 10 mg via INTRAVENOUS
  Filled 2014-08-27 (×3): qty 2

## 2014-08-27 MED ORDER — EPHEDRINE SULFATE 50 MG/ML IJ SOLN
INTRAMUSCULAR | Status: AC
  Filled 2014-08-27: qty 1

## 2014-08-27 MED ORDER — ASPIRIN 81 MG PO CHEW: 324.0000 mg | CHEWABLE_TABLET | Freq: Every day | ORAL | Status: DC

## 2014-08-27 MED ORDER — POTASSIUM CHLORIDE 10 MEQ/50ML IV SOLN: 10.0000 meq | INTRAVENOUS | Status: AC

## 2014-08-27 MED ORDER — LACTATED RINGERS IV SOLN: 500.0000 mL | Freq: Once | INTRAVENOUS | Status: AC | PRN

## 2014-08-27 MED ORDER — ONDANSETRON HCL 4 MG/2ML IJ SOLN: 4.0000 mg | Freq: Four times a day (QID) | INTRAMUSCULAR | Status: DC | PRN

## 2014-08-27 MED ORDER — SODIUM CHLORIDE 0.9 % IV SOLN: INTRAVENOUS | Status: DC

## 2014-08-27 MED ORDER — VANCOMYCIN HCL IN DEXTROSE 1-5 GM/200ML-% IV SOLN
1000.0000 mg | Freq: Once | INTRAVENOUS | Status: AC
  Administered 2014-08-27: 1000 mg via INTRAVENOUS
  Filled 2014-08-27: qty 200

## 2014-08-27 MED ORDER — METOPROLOL TARTRATE 1 MG/ML IV SOLN: 2.5000 mg | INTRAVENOUS | Status: DC | PRN

## 2014-08-27 MED ORDER — MIDAZOLAM HCL 10 MG/2ML IJ SOLN
INTRAMUSCULAR | Status: AC
  Filled 2014-08-27: qty 2

## 2014-08-27 MED ORDER — VECURONIUM BROMIDE 10 MG IV SOLR
INTRAVENOUS | Status: AC
  Filled 2014-08-27: qty 20

## 2014-08-27 MED ORDER — SODIUM CHLORIDE 0.9 % IV SOLN
INTRAVENOUS | Status: DC | PRN
  Administered 2014-08-27: 07:00:00 via INTRAVENOUS

## 2014-08-27 MED ORDER — LACTATED RINGERS IV SOLN
INTRAVENOUS | Status: DC | PRN
  Administered 2014-08-27: 07:00:00 via INTRAVENOUS

## 2014-08-27 MED ORDER — FAMOTIDINE IN NACL 20-0.9 MG/50ML-% IV SOLN
20.0000 mg | Freq: Two times a day (BID) | INTRAVENOUS | Status: AC
  Administered 2014-08-27 (×2): 20 mg via INTRAVENOUS
  Filled 2014-08-27: qty 50

## 2014-08-27 MED ORDER — MORPHINE SULFATE 2 MG/ML IJ SOLN
2.0000 mg | INTRAMUSCULAR | Status: DC | PRN
  Administered 2014-08-28 (×2): 2 mg via INTRAVENOUS
  Filled 2014-08-27 (×2): qty 1

## 2014-08-27 MED ORDER — PROTAMINE SULFATE 10 MG/ML IV SOLN
INTRAVENOUS | Status: AC
  Filled 2014-08-27: qty 25

## 2014-08-27 MED ORDER — VECURONIUM BROMIDE 10 MG IV SOLR
INTRAVENOUS | Status: DC | PRN
  Administered 2014-08-27 (×2): 5 mg via INTRAVENOUS

## 2014-08-27 MED ORDER — SODIUM CHLORIDE 0.9 % IJ SOLN
3.0000 mL | Freq: Two times a day (BID) | INTRAMUSCULAR | Status: DC
  Administered 2014-08-28 – 2014-08-31 (×6): 3 mL via INTRAVENOUS

## 2014-08-27 MED ORDER — SUCCINYLCHOLINE CHLORIDE 20 MG/ML IJ SOLN
INTRAMUSCULAR | Status: AC
  Filled 2014-08-27: qty 1

## 2014-08-27 MED ORDER — HEPARIN SODIUM (PORCINE) 1000 UNIT/ML IJ SOLN
INTRAMUSCULAR | Status: DC | PRN
  Administered 2014-08-27: 30000 [IU] via INTRAVENOUS
  Administered 2014-08-27: 5000 [IU] via INTRAVENOUS

## 2014-08-27 MED ORDER — ALBUMIN HUMAN 5 % IV SOLN
INTRAVENOUS | Status: DC | PRN
  Administered 2014-08-27: 14:00:00 via INTRAVENOUS

## 2014-08-27 MED ORDER — SODIUM CHLORIDE 0.9 % IV SOLN
INTRAVENOUS | Status: DC
  Administered 2014-08-27: 4.3 [IU]/h via INTRAVENOUS
  Filled 2014-08-27 (×2): qty 2.5

## 2014-08-27 MED ORDER — CEFUROXIME SODIUM 1.5 G IJ SOLR
1.5000 g | Freq: Two times a day (BID) | INTRAMUSCULAR | Status: AC
  Administered 2014-08-27 – 2014-08-29 (×4): 1.5 g via INTRAVENOUS
  Filled 2014-08-27 (×4): qty 1.5

## 2014-08-27 SURGICAL SUPPLY — 97 items
ADAPTER CARDIO PERF ANTE/RETRO (ADAPTER) ×3 IMPLANT
ADAPTER MULTI PERFUSION 15 (ADAPTER) ×3 IMPLANT
APPLICATOR TIP COSEAL (VASCULAR PRODUCTS) ×6 IMPLANT
ATTRACTOMAT 16X20 MAGNETIC DRP (DRAPES) ×3 IMPLANT
BAG DECANTER FOR FLEXI CONT (MISCELLANEOUS) ×3 IMPLANT
BLADE STERNUM SYSTEM 6 (BLADE) ×3 IMPLANT
BLADE SURG 15 STRL LF DISP TIS (BLADE) ×1 IMPLANT
BLADE SURG 15 STRL SS (BLADE) ×2
CANISTER SUCTION 2500CC (MISCELLANEOUS) ×3 IMPLANT
CANNULA AORTIC ROOT 9FR (CANNULA) ×3 IMPLANT
CANNULA GUNDRY RCSP 15FR (MISCELLANEOUS) ×3 IMPLANT
CANNULA SUMP PERICARDIAL (CANNULA) ×3 IMPLANT
CANNULA VENOUS LOW PROF 34X46 (CANNULA) ×3 IMPLANT
CATH ROBINSON RED A/P 18FR (CATHETERS) ×9 IMPLANT
CATH THORACIC 36FR (CATHETERS) ×3 IMPLANT
CATH THORACIC 36FR RT ANG (CATHETERS) ×3 IMPLANT
CAUTERY HIGH TEMP VAS (MISCELLANEOUS) ×3 IMPLANT
CONN 1/2X1/2X1/2  BEN (MISCELLANEOUS) ×2
CONN 1/2X1/2X1/2 BEN (MISCELLANEOUS) ×1 IMPLANT
CONN ST 1/4X3/8  BEN (MISCELLANEOUS) ×4
CONN ST 1/4X3/8 BEN (MISCELLANEOUS) ×2 IMPLANT
CONN Y 3/8X3/8X3/8  BEN (MISCELLANEOUS) ×2
CONN Y 3/8X3/8X3/8 BEN (MISCELLANEOUS) ×1 IMPLANT
CONT SPEC 4OZ CLIKSEAL STRL BL (MISCELLANEOUS) ×6 IMPLANT
CONT SPEC STER OR (MISCELLANEOUS) ×3 IMPLANT
COVER SURGICAL LIGHT HANDLE (MISCELLANEOUS) ×6 IMPLANT
CRADLE DONUT ADULT HEAD (MISCELLANEOUS) ×3 IMPLANT
DRAPE SLUSH/WARMER DISC (DRAPES) IMPLANT
DRSG COVADERM 4X14 (GAUZE/BANDAGES/DRESSINGS) ×3 IMPLANT
ELECT CAUTERY BLADE 6.4 (BLADE) ×3 IMPLANT
ELECT REM PT RETURN 9FT ADLT (ELECTROSURGICAL) ×6
ELECTRODE REM PT RTRN 9FT ADLT (ELECTROSURGICAL) ×2 IMPLANT
GAUZE SPONGE 4X4 12PLY STRL (GAUZE/BANDAGES/DRESSINGS) ×3 IMPLANT
GLOVE BIO SURGEON STRL SZ 6 (GLOVE) IMPLANT
GLOVE BIO SURGEON STRL SZ 6.5 (GLOVE) IMPLANT
GLOVE BIO SURGEON STRL SZ7 (GLOVE) IMPLANT
GLOVE BIO SURGEON STRL SZ7.5 (GLOVE) IMPLANT
GLOVE BIO SURGEONS STRL SZ 6.5 (GLOVE)
GLOVE EUDERMIC 7 POWDERFREE (GLOVE) ×6 IMPLANT
GOWN STRL REUS W/ TWL LRG LVL3 (GOWN DISPOSABLE) ×4 IMPLANT
GOWN STRL REUS W/ TWL XL LVL3 (GOWN DISPOSABLE) ×1 IMPLANT
GOWN STRL REUS W/TWL LRG LVL3 (GOWN DISPOSABLE) ×8
GOWN STRL REUS W/TWL XL LVL3 (GOWN DISPOSABLE) ×2
GRAFT 4 BRANCH 30X50 (Prosthesis & Implant Heart) ×3 IMPLANT
GRAFT HEMASHIELD 8MM (Vascular Products) ×2 IMPLANT
GRAFT VASC STRG 30X8KNIT (Vascular Products) ×1 IMPLANT
HEART VENT LT CURVED (MISCELLANEOUS) ×3 IMPLANT
HEMOSTAT POWDER SURGIFOAM 1G (HEMOSTASIS) ×9 IMPLANT
HEMOSTAT SURGICEL 2X14 (HEMOSTASIS) ×3 IMPLANT
INSERT FOGARTY SM (MISCELLANEOUS) ×9 IMPLANT
KIT BASIN OR (CUSTOM PROCEDURE TRAY) ×3 IMPLANT
KIT CATH CPB BARTLE (MISCELLANEOUS) ×3 IMPLANT
KIT ROOM TURNOVER OR (KITS) ×3 IMPLANT
KIT SUCTION CATH 14FR (SUCTIONS) ×3 IMPLANT
LINE VENT (MISCELLANEOUS) ×3 IMPLANT
LOOP VESSEL MAXI BLUE (MISCELLANEOUS) ×3 IMPLANT
NS IRRIG 1000ML POUR BTL (IV SOLUTION) ×15 IMPLANT
PACK OPEN HEART (CUSTOM PROCEDURE TRAY) ×3 IMPLANT
PAD ARMBOARD 7.5X6 YLW CONV (MISCELLANEOUS) ×6 IMPLANT
SEALANT SURG COSEAL 8ML (VASCULAR PRODUCTS) ×6 IMPLANT
SET CARDIOPLEGIA MPS 5001102 (MISCELLANEOUS) ×3 IMPLANT
SPONGE GAUZE 4X4 12PLY STER LF (GAUZE/BANDAGES/DRESSINGS) ×6 IMPLANT
SPONGE LAP 18X18 X RAY DECT (DISPOSABLE) ×3 IMPLANT
SUT BONE WAX W31G (SUTURE) ×3 IMPLANT
SUT ETHIBON 2 0 V 52N 30 (SUTURE) ×6 IMPLANT
SUT ETHIBON EXCEL 2-0 V-5 (SUTURE) IMPLANT
SUT ETHIBOND V-5 VALVE (SUTURE) IMPLANT
SUT PROLENE 3 0 SH 1 (SUTURE) ×3 IMPLANT
SUT PROLENE 3 0 SH 48 (SUTURE) ×15 IMPLANT
SUT PROLENE 3 0 SH DA (SUTURE) ×15 IMPLANT
SUT PROLENE 4 0 RB 1 (SUTURE) ×12
SUT PROLENE 4-0 RB1 .5 CRCL 36 (SUTURE) ×6 IMPLANT
SUT PROLENE 5 0 RB 2 (SUTURE) ×6 IMPLANT
SUT SILK 2 0 SH CR/8 (SUTURE) ×3 IMPLANT
SUT SILK 3 0 (SUTURE) ×2
SUT SILK 3-0 18XBRD TIE 12 (SUTURE) ×1 IMPLANT
SUT STEEL 6MS V (SUTURE) ×3 IMPLANT
SUT STEEL STERNAL CCS#1 18IN (SUTURE) IMPLANT
SUT STEEL SZ 6 DBL 3X14 BALL (SUTURE) IMPLANT
SUT VIC AB 1 CTX 36 (SUTURE) ×6
SUT VIC AB 1 CTX36XBRD ANBCTR (SUTURE) ×3 IMPLANT
SUT VIC AB 2-0 CT1 27 (SUTURE)
SUT VIC AB 2-0 CT1 TAPERPNT 27 (SUTURE) IMPLANT
SUT VIC AB 2-0 CTX 27 (SUTURE) ×3 IMPLANT
SUT VIC AB 3-0 SH 27 (SUTURE) ×2
SUT VIC AB 3-0 SH 27X BRD (SUTURE) ×1 IMPLANT
SUT VIC AB 3-0 X1 27 (SUTURE) ×6 IMPLANT
SUTURE E-PAK OPEN HEART (SUTURE) ×3 IMPLANT
SYSTEM SAHARA CHEST DRAIN ATS (WOUND CARE) ×3 IMPLANT
TAPE CLOTH SURG 4X10 WHT LF (GAUZE/BANDAGES/DRESSINGS) ×6 IMPLANT
TOWEL OR 17X24 6PK STRL BLUE (TOWEL DISPOSABLE) ×3 IMPLANT
TOWEL OR 17X26 10 PK STRL BLUE (TOWEL DISPOSABLE) ×3 IMPLANT
TRAY FOLEY IC TEMP SENS 14FR (CATHETERS) ×3 IMPLANT
TUBING CONNECTOR 18X5MM (MISCELLANEOUS) ×3 IMPLANT
UNDERPAD 30X30 INCONTINENT (UNDERPADS AND DIAPERS) ×3 IMPLANT
WATER STERILE IRR 1000ML POUR (IV SOLUTION) ×6 IMPLANT
YANKAUER SUCT BULB TIP NO VENT (SUCTIONS) ×3 IMPLANT

## 2014-08-27 NOTE — Interval H&P Note (Signed)
History and Physical Interval Note:  08/27/2014 6:57 AM  Edward Hopkins  has presented today for surgery, with the diagnosis of TAA  The various methods of treatment have been discussed with the patient and family. After consideration of risks, benefits and other options for treatment, the patient has consented to  Procedure(s) with comments: REPLACEMENT ASCENDING AORTA AND ARCH (N/A) - CIRC ARREST  RIGHT AXILLARY CANNULATION TRANSESOPHAGEAL ECHOCARDIOGRAM (TEE) (N/A) RIGHT AXILLARY CANNULATION (N/A) as a surgical intervention .  The patient's history has been reviewed, patient examined, no change in status, stable for surgery.  I have reviewed the patient's chart and labs.  Questions were answered to the patient's satisfaction.     [name]

## 2014-08-27 NOTE — Anesthesia Preprocedure Evaluation (Addendum)
Anesthesia Evaluation  Patient identified by MRN, date of birth, ID band Patient awake    Reviewed: Allergy & Precautions, H&P , NPO status , Patient's Chart, lab work & pertinent test results  Airway Mallampati: I  TM Distance: >3 FB Neck ROM: Full    Dental  (+) Dental Advidsory Given, Poor Dentition, Loose   Pulmonary Current Smoker,    Pulmonary exam normal       Cardiovascular hypertension, Pt. on medications + Peripheral Vascular Disease and +CHF     Neuro/Psych    GI/Hepatic   Endo/Other    Renal/GU      Musculoskeletal   Abdominal   Peds  Hematology   Anesthesia Other Findings Loose right top rear tooth.   1/2 Pack smoker x 50 years Bilateral hip joint arthritis.   Reproductive/Obstetrics                           Anesthesia Physical Anesthesia Plan  ASA: III  Anesthesia Plan: General   Post-op Pain Management:    Induction: Intravenous  Airway Management Planned: Oral ETT  Additional Equipment: Arterial line, CVP, PA Cath, TEE and Ultrasound Guidance Line Placement  Intra-op Plan:   Post-operative Plan: Post-operative intubation/ventilation  Informed Consent: I have reviewed the patients History and Physical, chart, labs and discussed the procedure including the risks, benefits and alternatives for the proposed anesthesia with the patient or authorized representative who has indicated his/her understanding and acceptance.   Dental Advisory Given  Plan Discussed with: CRNA and Surgeon  Anesthesia Plan Comments:        Anesthesia Quick Evaluation

## 2014-08-27 NOTE — Progress Notes (Signed)
[facility] BRIEF SICU PROGRESS NOTE  Day of Surgery  S/P Procedure(s) (LRB): REPLACEMENT ASCENDING AORTA AND ARCH (N/A) TRANSESOPHAGEAL ECHOCARDIOGRAM (TEE) (N/A) RIGHT AXILLARY CANNULATION (N/A)   Starting to wake on vent NSR - AAI paced w/ stable hemodynamics Chest tube output low UOP adequate Labs okay  Plan: Continue routine early postop  Rexene Alberts 08/27/2014 8:30 PM

## 2014-08-27 NOTE — Brief Op Note (Signed)
08/27/2014  12:50 PM  PATIENT:  Edward Hopkins  64 y.o. male  PRE-OPERATIVE DIAGNOSIS:  TAA  POST-OPERATIVE DIAGNOSIS:  TAA  PROCEDURE:  Procedure(s) with comments : REPLACEMENT ASCENDING AORTA AND ARCH (N/A) -Circulatory Arrest - 30x10x8x8x10 mm Hemashield Graft  RIGHT AXILLARY CANNULATION  TRANSESOPHAGEAL ECHOCARDIOGRAM (TEE) (N/A)  SURGEON:  Surgeon(s) and Role:    * [name], MD - Primary  PHYSICIAN ASSISTANT: Hurbert Duran PA-C  ANESTHESIA:   general  EBL:  Total I/O In: 1250 [I.V.:1250] Out: 1150 [Urine:1150]  BLOOD ADMINISTERED: CELLSAVER  DRAINS: Mediastinal Chest drains   LOCAL MEDICATIONS USED:  NONE  SPECIMEN:  Source of Specimen:  Ascending Aorta  DISPOSITION OF SPECIMEN:  PATHOLOGY  COUNTS:  YES  TOURNIQUET:  * No tourniquets in log *  DICTATION: .Dragon Dictation  PLAN OF CARE: Admit to inpatient   PATIENT DISPOSITION:  ICU - intubated and hemodynamically stable.   Delay start of Pharmacological VTE agent (>24hrs) due to surgical blood loss or risk of bleeding: yes   Aortic Valve Etiology   Aortic Insufficiency: None  Aortic Valve Disease:  No.  Aortic Stenosis:  No.  Etiology (Choose at least one and up to  5 etiologies):  Primary Aortic Disease, Aortic dissection   Aortic Valve  Procedure Performed:  Replacement: No.  Repair/Reconstruction: No.   Aortic Annular Enlargement: No.

## 2014-08-27 NOTE — Transfer of Care (Signed)
Immediate Anesthesia Transfer of Care Note  Patient: Edward Hopkins  Procedure(s) Performed: Procedure(s) with comments: REPLACEMENT ASCENDING AORTA AND ARCH (N/A) - CIRC ARREST  RIGHT AXILLARY CANNULATION TRANSESOPHAGEAL ECHOCARDIOGRAM (TEE) (N/A) RIGHT AXILLARY CANNULATION (N/A)  Patient Location: SICU  Anesthesia Type:General  Level of Consciousness: Patient remains intubated per anesthesia plan  Airway & Oxygen Therapy: Patient placed on Ventilator (see vital sign flow sheet for setting)  Post-op Assessment: Report given to RN  Post vital signs: Reviewed and stable  Last Vitals:  Filed Vitals:   08/27/14 0557  BP: 121/95  Pulse: 70  Temp: 37.1 C  Resp: 16    Complications: No apparent anesthesia complications

## 2014-08-27 NOTE — Anesthesia Procedure Notes (Signed)
Procedure Name: Intubation Date/Time: 08/27/2014 7:44 AM Performed by: Neldon Newport Pre-anesthesia Checklist: Patient being monitored, Suction available, Emergency Drugs available, Patient identified and Timeout performed Patient Re-evaluated:Patient Re-evaluated prior to inductionOxygen Delivery Method: Circle system utilized Preoxygenation: Pre-oxygenation with 100% oxygen Intubation Type: IV induction Ventilation: Mask ventilation without difficulty Laryngoscope Size: Mac and 3 Grade View: Grade I Tube type: Oral Tube size: 8.0 mm Number of attempts: 1 Placement Confirmation: positive ETCO2,  ETT inserted through vocal cords under direct vision and breath sounds checked- equal and bilateral Secured at: 23 cm Tube secured with: Tape Dental Injury: Teeth and Oropharynx as per pre-operative assessment

## 2014-08-27 NOTE — Op Note (Signed)
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 Median Sternotomy 2. Right axillary artery cannulation using an 8 mm Hemashield graft. 3.   Extracorporeal circulation via the right axillary artery and right atrium 4.   Replacement of ascending aorta and aortic arch with aorto-innominate and aorto-left carotid bypass using a 30 x 10 x 8 x 8 x 10 mm Hemashield Platinum graft. Supra-coronary proximal anastomosis.  Anesthesia:  General Endotracheal  Anesthesiologist: Dr. Lillia Abed   Clinical History/Surgical Indication:  The patient is a 64 year old gentleman who is from [place] but has been in [place] for a while living with his girlfriend while he decides about moving down here permanently. He did some cocaine on the night before admission and drank a few shots of gin and was playing cards when he started having mid chest pain that worsened and he came to the ER. He was markedly hypertensive on arrival and a CTA of the chest showed what radiology felt was an ascending and descending aortic dissection with mediastinal hematoma. After arrival his pain improved and is currently 2/10. He has a history of hypertension but has not taken his meds for a long time since he ran out and was transitioning from [place] to [facility] with no medical doctor. He smokes 1 ppd for 50 years and drinks alcohol frequently. He has a history of IVDA until 1972 and had a history of endocarditis in the distant past treated with antibiotics. CTA of the chest showed an acute intramural aortic hematoma mainly involving the descending aorta with some extension proximally into the aortic arch and ascending aorta. His initial CT on presentation was on 07/17/2014 and he responded to antihypertensives with  resolution of his back pain. He was transferred to the floor on 3/8 and shortly thereafter developed recurrent severe back pain. A repeat CTA was unchanged but did show a LLL pulmonary embolus that in retrospect was present on his initial CTA but not called. He had an IVC filter placed. His pain resolved with continued antihypertensive therapy and he went home. Since going home he has had no further symptoms. He had a follow up CTA on 08/18/2014 that surprisingly showed progressive abnormality involving the ascending aorta and proximal arch with formation of penetrating ulcers or pseudoaneurysms in the area of the previous ascending intramural hematoma. The ascending aorta has progressively enlarged to 5.8 cm. The aortic root does not appear to be involved. The descending aortic intramural hematoma appears to be resolving as does the anterior mediastinal hemorrhage.   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 This extends up to the proximal arch but does not appear to involve the aortic root. The proximal coronaries have minimal calcification and fill with contrast so I doubt that he has significant proximal coronary disease. I don't thin a cath would be wise with the pseudoaneurysm formation in the ascending aorta. A nuclear stress test would not be useful because even if there is an abnormality I don't think a cath could be done safely. This will require surgical replacement of the ascending aorta and part or all of the aortic arch. Hopefully the aortic root can remain. His aortic valve was normal on echo. I discussed the operative procedure with the patient and his girlfriend including  alternatives, benefits and risks; including but not limited to bleeding, blood transfusion, infection, stroke, myocardial infarction, heart block requiring a permanent pacemaker, organ dysfunction, and death. Edward Hopkins understands and agrees to proceed.  TEE performed by Dr. Lillia Abed  TEE reviewed by me and shows a trileaflet aortic valve with a trace of central AI. There is trivial MR. LV function is preserved with moderate LVH. The aortic root appears normal to mildly dilated at 3.5 cm. The ascending aorta and arch is markedly dilated extending to the left common carotid artery. The distal aortic arch beyond this is normal-sized.      Preparation:  The patient was seen in the preoperative holding area and the correct patient, correct operation were confirmed with the patient after reviewing the medical record and catheterization. The consent was signed by me. Preoperative antibiotics were given. A pulmonary arterial line and bilateral radial arterial lines were placed by the anesthesia team. The patient was taken back to the operating room and positioned supine on the operating room table. After being placed under general endotracheal anesthesia by the anesthesia team a foley catheter was placed. The neck, chest, abdomen, and both legs were prepped with betadine soap and solution and draped in the usual sterile manner. A surgical time-out was taken and the correct patient and operative procedure were confirmed with the nursing and anesthesia staff.   Right axillary artery exposure and cannulation:  A transverse incision was made below the right clavicle. The pectoralis major muscle was split along its fibers and the pectoralis minor muscle was retracted laterally. The brachial plexus was identified and gently retracted laterally to expose the axillary artery. Exposure was difficult due to the patient's obesity. The artery was controlled proximally and distally with vessel loops. The patient was fully heparinized and ACT maintained greater than 400. The axillary artery was clamped proximally and distally with peripheral Debakey clamps. It was opened longitudinally. An 8 mm Hemashield dacron graft was anastomosed in an end to side manner using continuous 5-0 prolene suture.  CoSeal was applied for hemostasis and the clamp removed. The graft was connected to the arterial end of the bypass circuit.   Cardiopulmonary Bypass:  A median sternotomy was performed. The pericardium was opened in the midline. Right ventricular function appeared normal. The ascending aorta was markedly aneurysmal with adhesions to the pericardium. Venous cannulation was performed via the right atrial appendage using a two-staged venous cannula.  Systemic cooling to 20 degrees Centigrade and topical cooling of the heart with iced saline were used. Hyperkalemic retrograde cold blood cardioplegia was used to induce diastolic arrest and was then cold blood retrograde cardioplegia was given at about 20 minute intervals throughout the period of arrest to maintain myocardial temperature at or below 10 degrees centigrade. A temperature probe was inserted into the interventricular septum and an insulating pad was placed in the pericardium. CO2 was insufflated into the pericardium throughout the case to minimize intracardiac air.    Resection and grafting of ascending aortic aneurysm:  The patient was placed on cardiopulmonary bypass and a left ventricular vent was placed via the right superior pulmonary vein. Systemic cooling was begun with a goal temperature of 18 degrees centigrade by bladder and rectal temperature probes. A retrograde cardioplegia cannula was placed through the right atrium into the coronary sinus without difficulty. While cooling, the bovine trunk was dissected free and encircled with a silastic tape. The left common carotid and innominate arteries were also dissected free and encircled.  The aortic arch was normal sized just after the bovine trunk. After 30 minutes of cooling the target temperature of 20 degrees centigrade was reached. Cerebral oximetry was 70% bilaterally. BIS was zero. The patient was given Propofol and 125 mg of Solumedrol. The head was packed in ice. The bed was placed  in steep trendelenburg. Circulatory arrest was begun and the blood volume emptied into the venous reservoir. The innominate and left carotid arteries were occluded with peripheral Debakey clamps. Continuous antegrade cerebral perfusion was begun via the right axillary artery. Cold blood retrograde cardioplegia was given and myocardial temperature dropped to 10 degrees centigrade. Additional doses were given at approximately 20 minute intervals throughout the period of circulatory arrest and cross-clamping. Complete diastolic arrest was maintained.The aorta was transected just proximal to the left subclavian artery. The aortic diameter was measured at 30 mm here. The aorta distal to this was examined and there was no sign of any distal arch or descending aortic dissection. There was a sub-acute dissection tear in the mid-ascending aorta extending to the proximal arch with a large amount of hematoma within the dissection plane. A 30 x 10 x 8 x 8 x 10 mm Hemashield Platinum vascular graft was prepared. ( Catalog # [id number], Lot # [id number]). One of the 8 mm side arm grafts was ligated with a heavy silk tie and suture ligated with two layers of 3-0 prolene.  The end of the 30 mm graft was anastomosed to the distal aortic arch in an end to end manner using 3-0 prolene continuous suture with a felt strip to reinforce the anastomisis. A light coating of CoSeal was applied to seal needle holes. The other end of the Y in the arterial end of the bypass circuit was then connected to the 74mm side arm graft and circulation was slowly resumed. The two other sidearm grafts were controlled with vascular clamps.  The aortic graft was cross-clamped proximal to the side arm graft and full CPB support was resumed. Circulatory arrest time was 51 minutes. It was necessary to mobilize the ascending aorta and arch under circulatory arrest due to the adhesions present and the concern that the aorta may be inadvertently opened during the  dissection.Antegrade cerebral perfusion time was 51 minutes. There was excellent flow back from the left subclavian artery and cerebral oximetry remained 70 % throughout this period. The patient was rewarmed to 37 degrees.   Aorto-left carotid and aorto-innominate bypass:  The 8 mm sidearm graft was brought under the brachiocephalic vein and anastomosed to the  left common carotid in an end to end manner using continuous 4-0 prolene suture. The clamps were removed from the left carotid and the graft. The the 10 mm sidearm graft was brought under the brachiocephalic vein and cut to the appropriate length. It was anastomosed to the innominate artery in an end to end manner using continuous 4-0 prolene suture. The clamps were removed.   Proximal anastomosis:  The ascending aorta was opened longitudinally down to the sinotubular junction. The aortic root appeared to be normal sized  with no sinus dilatation and the coronary positions were close to the annulus. The aortic valve looked structurally normal. There was no stenosis and no calcification of the leaflets or annulus. The aorta was transected at the STJ and the main graft cut to the appropriate length. It was anastomosed to the aorta in an end to end manner using continuous 3-0 prolene suture with a felt strip to reinforce the anastomosis.  Completion:  The crossclamp was removed with a time of 117 minutes. There was spontaneous return of ventricular tachycardia and the patient was defibrillated to sinus rhythm.  The distal and proximal anastomoses were checked for hemostasis. The position of the grafts was satisfactory. The vascular anastomoses all appeared hemostatic. Two temporary epicardial pacing wires were placed on the right atrium and two on the right ventricle. The patient was weaned from CPB without difficulty on no inotropes. CPB time was 202 minutes. Cardiac output was 5 LPM. Heparin was fully reversed with protamine and the venous  cannula removed. The arterial sidearm graft and the axillary artery graft were ligated with a heavy silk tie and suture ligated with a pledgetted 3-0 prolene suture. Hemostasis was achieved. Mediastinal and right pleural drainage tubes were placed. The sternum was closed with double #6 stainless steel wires. The fascia was closed with continuous # 1 vicryl suture. The subcutaneous tissue was closed with 2-0 vicryl continuous suture. The skin was closed with 3-0 vicryl subcuticular suture. The right subclavicular incision was closed in layers in a similar manner. All sponge, needle, and instrument counts were reported correct at the end of the case. Dry sterile dressings were placed over the incisions and around the chest tubes which were connected to pleurevac suction. The patient was then transported to the surgical intensive care unit in critical but stable condition.

## 2014-08-27 NOTE — Anesthesia Postprocedure Evaluation (Signed)
Anesthesia Post Note  Patient: Edward Hopkins  Procedure(s) Performed: Procedure(s) (LRB): REPLACEMENT ASCENDING AORTA AND ARCH (N/A) TRANSESOPHAGEAL ECHOCARDIOGRAM (TEE) (N/A) RIGHT AXILLARY CANNULATION (N/A)  Anesthesia type: General  Patient location: ICU  Post pain: Pain level controlled  Post assessment: Post-op Vital signs reviewed  Last Vitals:  Filed Vitals:   08/27/14 0557  BP: 121/95  Pulse: 70  Temp: 37.1 C  Resp: 16    Post vital signs: stable  Level of consciousness: Patient remains intubated per anesthesia plan  Complications: No apparent anesthesia complications

## 2014-08-27 NOTE — Progress Notes (Signed)
  Echocardiogram Echocardiogram Transesophageal has been performed.  Edward Hopkins 08/27/2014, 9:27 AM

## 2014-08-28 ENCOUNTER — Inpatient Hospital Stay (HOSPITAL_COMMUNITY): Payer: Medicare Other

## 2014-08-28 DIAGNOSIS — Z952 Presence of prosthetic heart valve: Secondary | ICD-10-CM | POA: Diagnosis not present

## 2014-08-28 DIAGNOSIS — J9811 Atelectasis: Secondary | ICD-10-CM | POA: Diagnosis not present

## 2014-08-28 LAB — GLUCOSE, CAPILLARY
GLUCOSE-CAPILLARY: 146 mg/dL — AB (ref 70–99)
GLUCOSE-CAPILLARY: 101 mg/dL — AB (ref 70–99)
GLUCOSE-CAPILLARY: 102 mg/dL — AB (ref 70–99)
GLUCOSE-CAPILLARY: 143 mg/dL — AB (ref 70–99)
Glucose-Capillary: 100 mg/dL — ABNORMAL HIGH (ref 70–99)
Glucose-Capillary: 100 mg/dL — ABNORMAL HIGH (ref 70–99)
Glucose-Capillary: 101 mg/dL — ABNORMAL HIGH (ref 70–99)
Glucose-Capillary: 101 mg/dL — ABNORMAL HIGH (ref 70–99)
Glucose-Capillary: 102 mg/dL — ABNORMAL HIGH (ref 70–99)
Glucose-Capillary: 102 mg/dL — ABNORMAL HIGH (ref 70–99)
Glucose-Capillary: 103 mg/dL — ABNORMAL HIGH (ref 70–99)
Glucose-Capillary: 104 mg/dL — ABNORMAL HIGH (ref 70–99)
Glucose-Capillary: 104 mg/dL — ABNORMAL HIGH (ref 70–99)
Glucose-Capillary: 105 mg/dL — ABNORMAL HIGH (ref 70–99)
Glucose-Capillary: 106 mg/dL — ABNORMAL HIGH (ref 70–99)
Glucose-Capillary: 108 mg/dL — ABNORMAL HIGH (ref 70–99)
Glucose-Capillary: 112 mg/dL — ABNORMAL HIGH (ref 70–99)
Glucose-Capillary: 113 mg/dL — ABNORMAL HIGH (ref 70–99)
Glucose-Capillary: 132 mg/dL — ABNORMAL HIGH (ref 70–99)
Glucose-Capillary: 154 mg/dL — ABNORMAL HIGH (ref 70–99)
Glucose-Capillary: 92 mg/dL (ref 70–99)
Glucose-Capillary: 96 mg/dL (ref 70–99)
Glucose-Capillary: 99 mg/dL (ref 70–99)
Glucose-Capillary: 99 mg/dL (ref 70–99)

## 2014-08-28 LAB — BASIC METABOLIC PANEL
Anion gap: 8 (ref 5–15)
BUN: 15 mg/dL (ref 6–23)
CO2: 22 mmol/L (ref 19–32)
CREATININE: 1.13 mg/dL (ref 0.50–1.35)
Calcium: 8.3 mg/dL — ABNORMAL LOW (ref 8.4–10.5)
Chloride: 109 mmol/L (ref 96–112)
GFR, EST AFRICAN AMERICAN: 76 mL/min — AB (ref >90)
GFR, EST NON AFRICAN AMERICAN: 66 mL/min — AB (ref >90)
Glucose, Bld: 106 mg/dL — ABNORMAL HIGH (ref 70–99)
POTASSIUM: 5.1 mmol/L (ref 3.5–5.1)
Sodium: 139 mmol/L (ref 135–145)

## 2014-08-28 LAB — POCT I-STAT 3, ART BLOOD GAS (G3+)
Acid-base deficit: 1 mmol/L (ref 0.0–2.0)
BICARBONATE: 24.3 meq/L — AB (ref 20.0–24.0)
Bicarbonate: 24.9 mEq/L — ABNORMAL HIGH (ref 20.0–24.0)
O2 Saturation: 96 %
O2 Saturation: 97 %
PCO2 ART: 41.2 mmHg (ref 35.0–45.0)
PH ART: 7.371 (ref 7.350–7.450)
Patient temperature: 37.1
TCO2: 26 mmol/L (ref 0–100)
TCO2: 26 mmol/L (ref 0–100)
pCO2 arterial: 43.1 mmHg (ref 35.0–45.0)
pH, Arterial: 7.379 (ref 7.350–7.450)
pO2, Arterial: 89 mmHg (ref 80.0–100.0)
pO2, Arterial: 92 mmHg (ref 80.0–100.0)

## 2014-08-28 LAB — CBC
HCT: 29.4 % — ABNORMAL LOW (ref 39.0–52.0)
HEMATOCRIT: 29.2 % — AB (ref 39.0–52.0)
Hemoglobin: 9.6 g/dL — ABNORMAL LOW (ref 13.0–17.0)
Hemoglobin: 9.6 g/dL — ABNORMAL LOW (ref 13.0–17.0)
MCH: 27.9 pg (ref 26.0–34.0)
MCH: 28 pg (ref 26.0–34.0)
MCHC: 32.7 g/dL (ref 30.0–36.0)
MCHC: 32.9 g/dL (ref 30.0–36.0)
MCV: 85.5 fL (ref 78.0–100.0)
MCV: 85.1 fL (ref 78.0–100.0)
PLATELETS: 141 10*3/uL — AB (ref 150–400)
Platelets: 133 10*3/uL — ABNORMAL LOW (ref 150–400)
RBC: 3.44 MIL/uL — ABNORMAL LOW (ref 4.22–5.81)
RBC: 3.43 MIL/uL — AB (ref 4.22–5.81)
RDW: 15.1 % (ref 11.5–15.5)
RDW: 15 % (ref 11.5–15.5)
WBC: 8.5 10*3/uL (ref 4.0–10.5)
WBC: 8.9 10*3/uL (ref 4.0–10.5)

## 2014-08-28 LAB — MAGNESIUM
Magnesium: 2.9 mg/dL — ABNORMAL HIGH (ref 1.5–2.5)
Magnesium: 2.4 mg/dL (ref 1.5–2.5)

## 2014-08-28 LAB — POCT I-STAT, CHEM 8
BUN: 17 mg/dL (ref 6–23)
CREATININE: 1.1 mg/dL (ref 0.50–1.35)
Calcium, Ion: 1.2 mmol/L (ref 1.13–1.30)
Chloride: 104 mmol/L (ref 96–112)
Glucose, Bld: 140 mg/dL — ABNORMAL HIGH (ref 70–99)
HCT: 31 % — ABNORMAL LOW (ref 39.0–52.0)
HEMOGLOBIN: 10.5 g/dL — AB (ref 13.0–17.0)
POTASSIUM: 4.4 mmol/L (ref 3.5–5.1)
SODIUM: 136 mmol/L (ref 135–145)
TCO2: 19 mmol/L (ref 0–100)

## 2014-08-28 LAB — CREATININE, SERUM
Creatinine, Ser: 1.12 mg/dL (ref 0.50–1.35)
GFR calc Af Amer: 77 mL/min — ABNORMAL LOW (ref >90)
GFR calc non Af Amer: 67 mL/min — ABNORMAL LOW (ref >90)

## 2014-08-28 MED ORDER — ENOXAPARIN SODIUM 40 MG/0.4ML ~~LOC~~ SOLN
40.0000 mg | Freq: Every day | SUBCUTANEOUS | Status: DC
  Administered 2014-08-28 – 2014-08-30 (×3): 40 mg via SUBCUTANEOUS
  Filled 2014-08-28 (×4): qty 0.4

## 2014-08-28 MED ORDER — INSULIN ASPART 100 UNIT/ML ~~LOC~~ SOLN
0.0000 [IU] | SUBCUTANEOUS | Status: DC
  Administered 2014-08-28 – 2014-08-29 (×3): 2 [IU] via SUBCUTANEOUS

## 2014-08-28 MED ORDER — INSULIN DETEMIR 100 UNIT/ML ~~LOC~~ SOLN
15.0000 [IU] | Freq: Once | SUBCUTANEOUS | Status: AC
  Administered 2014-08-28: 15 [IU] via SUBCUTANEOUS
  Filled 2014-08-28: qty 0.15

## 2014-08-28 MED FILL — Heparin Sodium (Porcine) Inj 1000 Unit/ML: INTRAMUSCULAR | Qty: 30 | Status: AC

## 2014-08-28 MED FILL — Heparin Sodium (Porcine) Inj 1000 Unit/ML: INTRAMUSCULAR | Qty: 10 | Status: AC

## 2014-08-28 MED FILL — Sodium Chloride IV Soln 0.9%: INTRAVENOUS | Qty: 2000 | Status: AC

## 2014-08-28 MED FILL — Electrolyte-R (PH 7.4) Solution: INTRAVENOUS | Qty: 4000 | Status: AC

## 2014-08-28 MED FILL — Potassium Chloride Inj 2 mEq/ML: INTRAVENOUS | Qty: 40 | Status: AC

## 2014-08-28 MED FILL — Lidocaine HCl IV Inj 20 MG/ML: INTRAVENOUS | Qty: 10 | Status: AC

## 2014-08-28 MED FILL — Magnesium Sulfate Inj 50%: INTRAMUSCULAR | Qty: 10 | Status: AC

## 2014-08-28 MED FILL — Mannitol IV Soln 20%: INTRAVENOUS | Qty: 500 | Status: AC

## 2014-08-28 MED FILL — Sodium Bicarbonate IV Soln 8.4%: INTRAVENOUS | Qty: 50 | Status: AC

## 2014-08-28 NOTE — Procedures (Signed)
Extubation Procedure Note  Patient Details:   Name: Edward Hopkins DOB: April 28, 1951 MRN: [id number]   Airway Documentation:  Airway 8 mm (Active)  Secured at (cm) 23 cm 08/27/2014 11:50 PM  Measured From Lips 08/27/2014 11:50 PM  Secured Location Right 08/27/2014 11:50 PM  Secured By Pink Tape 08/27/2014 11:50 PM  Site Condition Dry 08/27/2014 11:50 PM    Evaluation  O2 sats: stable throughout Complications: No apparent complications Patient did tolerate procedure well. Bilateral Breath Sounds: Clear Suctioning: Airway Yes pt Vital Capacity was 1.0l NIF -22. Pt done well encouraged to cough he was extubated to 4 lpm   Wyman Songster [facility] 08/28/2014, 12:56 AM

## 2014-08-28 NOTE — Progress Notes (Addendum)
1 Day Post-Op Procedure(s) (LRB): REPLACEMENT ASCENDING AORTA AND ARCH (N/A) TRANSESOPHAGEAL ECHOCARDIOGRAM (TEE) (N/A) RIGHT AXILLARY CANNULATION (N/A) Subjective:  No complaints   Objective: Vital signs in last 24 hours: Temp:  [94.8 F (34.9 C)-99.1 F (37.3 C)] 98.1 F (36.7 C) (04/15 0700) Pulse Rate:  [71-90] 88 (04/15 0700) Cardiac Rhythm:  [-] Atrial paced (04/15 0400) Resp:  [11-27] 12 (04/15 0700) BP: (86-119)/(63-91) 111/76 mmHg (04/15 0700) SpO2:  [95 %-100 %] 98 % (04/15 0700) Arterial Line BP: (90-145)/(58-93) 105/62 mmHg (04/15 0700) FiO2 (%):  [40 %-50 %] 40 % (04/15 0000) Weight:  [95.1 kg (209 lb 10.5 oz)] 95.1 kg (209 lb 10.5 oz) (04/15 0500)  Hemodynamic parameters for last 24 hours: PAP: (12-28)/(5-17) 19/11 mmHg CO:  [2.8 L/min-3.9 L/min] 3.5 L/min CI:  [1.3 L/min/m2-2.7 L/min/m2] 2.7 L/min/m2  Intake/Output from previous day: 04/14 0701 - 04/15 0700 In: 6351 [I.V.:4456; Blood:685; NG/GT:60; IV Piggyback:1150] Out: [id number] [Urine:3190; Emesis/NG output:100; Blood:1100; Chest Tube:390] Intake/Output this shift:    General appearance: alert and cooperative Neurologic: intact Heart: regular rate and rhythm, S1, S2 normal, no murmur, click, rub or gallop Lungs: clear to auscultation bilaterally Abdomen: soft, non-tender; bowel sounds normal; no masses,  no organomegaly Extremities: edema mild Wound: dressings dry Left and right arterial line mean pressures equal.  Lab Results:  Recent Labs  08/27/14 2109 08/28/14 0432  WBC 6.1 8.5  HGB 9.6* 9.6*  HCT 28.9* 29.4*  PLT 127* 141*   BMET:  Recent Labs  08/27/14 2102 08/27/14 2109 08/28/14 0432  NA 142  --  139  K 5.0  --  5.1  CL 108  --  109  CO2  --   --  22  GLUCOSE 114*  --  106*  BUN 15  --  15  CREATININE 1.10 1.21 1.13  CALCIUM  --   --  8.3*    PT/INR:  Recent Labs  08/27/14 1500  LABPROT 18.6*  INR 1.53*   ABG    Component Value Date/Time   PHART 7.379 08/28/2014  0209   HCO3 24.3* 08/28/2014 0209   TCO2 26 08/28/2014 0209   ACIDBASEDEF 1.0 08/28/2014 0209   O2SAT 97.0 08/28/2014 0209   CBG (last 3)   Recent Labs  08/27/14 2157 08/27/14 2248 08/27/14 2358  GLUCAP 132* 113* 104*   CXR: mild left base atelectasis  ECG: sinus, LVH, RBBB old. No acute changes  Assessment/Plan: S/P Procedure(s) (LRB): REPLACEMENT ASCENDING AORTA AND ARCH (N/A) TRANSESOPHAGEAL ECHOCARDIOGRAM (TEE) (N/A) RIGHT AXILLARY CANNULATION (N/A)  He is hemodynamically stable off vasopressors. He had a cough since starting labetalol that he feels is related to that. Will use lopressor for now and see how he does. Labile hypertension history  Hx of DVT and PE at the time of his last hospital admission with dissection. He had a vena cava filter placed due to inability to anticoagulate. Will use Lovenox for DVT prophylaxis. Hold off on anticoagulation for now. Filter can be removed later. Once he recovers we will repeat his venous duplex.  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 He uses a walker and moves slowly. Will get PT consult.  Diuresis  Diabetes control: preop HgbA1c 5.8. Give some Levemir this am to get off insulin drip.  Will keep chest tubes in today.  See progression orders   LOS: 1 day    [name] 08/28/2014

## 2014-08-28 NOTE — Progress Notes (Signed)
      [address]       [place] [address]             [phone]      Up in chair  BP 116/93 mmHg  Pulse 102  Temp(Src) 99.3 F (37.4 C) (Oral)  Resp 21  Ht 5\' 8"  (1.727 m)  Wt 209 lb 10.5 oz (95.1 kg)  BMI 31.89 kg/m2  SpO2 93%   Intake/Output Summary (Last 24 hours) at 08/28/14 1849 Last data filed at 08/28/14 1800  Gross per 24 hour  Intake  996.3 ml  Output   1925 ml  Net -928.7 ml    Creatinine 1.12, Hct 29  Doing well POD # 1  Amenda Duclos C. Roxan Hockey, MD [facility] [phone]

## 2014-08-28 NOTE — Progress Notes (Signed)
Utilization review completed. Deslyn Cavenaugh, RN, BSN. 
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
## 2014-08-29 ENCOUNTER — Inpatient Hospital Stay (HOSPITAL_COMMUNITY): Payer: Medicare Other

## 2014-08-29 ENCOUNTER — Encounter (HOSPITAL_COMMUNITY): Payer: Self-pay | Admitting: Surgery

## 2014-08-29 DIAGNOSIS — I712 Thoracic aortic aneurysm, without rupture: Secondary | ICD-10-CM | POA: Diagnosis not present

## 2014-08-29 DIAGNOSIS — J9811 Atelectasis: Secondary | ICD-10-CM | POA: Diagnosis not present

## 2014-08-29 LAB — CBC
HCT: 28.8 % — ABNORMAL LOW (ref 39.0–52.0)
Hemoglobin: 9.4 g/dL — ABNORMAL LOW (ref 13.0–17.0)
MCH: 28 pg (ref 26.0–34.0)
MCHC: 32.6 g/dL (ref 30.0–36.0)
MCV: 85.7 fL (ref 78.0–100.0)
Platelets: 136 10*3/uL — ABNORMAL LOW (ref 150–400)
RBC: 3.36 MIL/uL — AB (ref 4.22–5.81)
RDW: 15 % (ref 11.5–15.5)
WBC: 9.2 10*3/uL (ref 4.0–10.5)

## 2014-08-29 LAB — GLUCOSE, CAPILLARY
GLUCOSE-CAPILLARY: 112 mg/dL — AB (ref 70–99)
GLUCOSE-CAPILLARY: 105 mg/dL — AB (ref 70–99)
Glucose-Capillary: 114 mg/dL — ABNORMAL HIGH (ref 70–99)
Glucose-Capillary: 122 mg/dL — ABNORMAL HIGH (ref 70–99)
Glucose-Capillary: 85 mg/dL (ref 70–99)

## 2014-08-29 LAB — BASIC METABOLIC PANEL
ANION GAP: 9 (ref 5–15)
BUN: 15 mg/dL (ref 6–23)
CALCIUM: 8 mg/dL — AB (ref 8.4–10.5)
CO2: 24 mmol/L (ref 19–32)
CREATININE: 1.09 mg/dL (ref 0.50–1.35)
Chloride: 102 mmol/L (ref 96–112)
GFR calc Af Amer: 80 mL/min — ABNORMAL LOW (ref >90)
GFR calc non Af Amer: 69 mL/min — ABNORMAL LOW (ref >90)
Glucose, Bld: 116 mg/dL — ABNORMAL HIGH (ref 70–99)
POTASSIUM: 4.2 mmol/L (ref 3.5–5.1)
Sodium: 135 mmol/L (ref 135–145)

## 2014-08-29 MED ORDER — INSULIN ASPART 100 UNIT/ML ~~LOC~~ SOLN: 0.0000 [IU] | Freq: Three times a day (TID) | SUBCUTANEOUS | Status: DC

## 2014-08-29 NOTE — Evaluation (Signed)
Physical Therapy Evaluation Patient Details Name: Edward Hopkins MRN: [id number] DOB: 07-14-1950 Today's Date: 08/29/2014   History of Present Illness  Patient is a 64 yo male admitted 08/27/14 with progressive enlargement of the ascending aorta with pseudoaneurysm formation.  Patient is s/p Replacement of the ascending aorta and part of all of the aortic arch using circulatory arrest and right axillary cannulation.   PMH:  Ascending and descending aortic dissection, HTN, Chest pain, aseptic necrosis Bil hips, polysubstance abuse    Clinical Impression  Patient presents with problems listed below.  Will benefit from acute PT to maximize independence prior to discharge home.    Follow Up Recommendations Home health PT;Supervision/Assistance - 24 hour    Equipment Recommendations  Rolling walker with 5" wheels    Recommendations for Other Services       Precautions / Restrictions Precautions Precautions: Sternal Restrictions Weight Bearing Restrictions: No      Mobility  Bed Mobility Overal bed mobility: Needs Assistance Bed Mobility: Supine to Sit;Sit to Supine     Supine to sit: Mod assist;HOB elevated Sit to supine: Mod assist;HOB elevated   General bed mobility comments: Instructed patient on rolling and moving to sitting.  Patient continued to attempt to move supine to sitting.  Required HOB elevated and assist to bring trunk up to sitting position.  Reviewed sternal precautions and decreased use of UE's for mobility.  Assist to bring LE's onto bed to return to supine.  Transfers Overall transfer level: Needs assistance Equipment used: 1 person hand held assist Transfers: Sit to/from Stand Sit to Stand: Min assist         General transfer comment: Verbal cues for technique.  Assist to rise to standing from elevated bed.  Assist for balance initially.  Patient stood x 4 minutes, stepping in place.  Declined OOB to recliner - wanted bath and awaiting visitors.  Reports  he will get OOB again this evening.  Ambulation/Gait                Stairs            Wheelchair Mobility    Modified Rankin (Stroke Patients Only)       Balance                                             Pertinent Vitals/Pain Pain Assessment: 0-10 Pain Score: 2  Pain Location: Anterior chest wall Pain Descriptors / Indicators: Sore Pain Intervention(s): Limited activity within patient's tolerance;Repositioned    Home Living Family/patient expects to be discharged to:: Private residence Living Arrangements: Spouse/significant other Available Help at Discharge: Available 24 hours/day;Friend(s) Type of Home: House Home Access: Stairs to enter Entrance Stairs-Rails: None Entrance Stairs-Number of Steps: 2 Home Layout: One level Home Equipment: Cane - single point      Prior Function Level of Independence: Independent with assistive device(s)         Comments: cane for mobility     Hand Dominance   Dominant Hand: Right    Extremity/Trunk Assessment   Upper Extremity Assessment: Overall WFL for tasks assessed (unable to fully test due to precautions)           Lower Extremity Assessment: Generalized weakness;LLE deficits/detail   LLE Deficits / Details: Decreased hip/knee ROM and strength.     Communication   Communication: No difficulties  Cognition Arousal/Alertness: Awake/alert  Behavior During Therapy: WFL for tasks assessed/performed Overall Cognitive Status: Within Functional Limits for tasks assessed                      General Comments      Exercises        Assessment/Plan    PT Assessment Patient needs continued PT services  PT Diagnosis Difficulty walking;Generalized weakness;Acute pain   PT Problem List Decreased strength;Decreased range of motion;Decreased activity tolerance;Decreased balance;Decreased mobility;Decreased knowledge of use of DME;Decreased knowledge of  precautions;Cardiopulmonary status limiting activity;Pain  PT Treatment Interventions DME instruction;Gait training;Functional mobility training;Therapeutic activities;Therapeutic exercise;Patient/family education   PT Goals (Current goals can be found in the Care Plan section) Acute Rehab PT Goals Patient Stated Goal: To be able to return home PT Goal Formulation: With patient Time For Goal Achievement: 09/05/14 Potential to Achieve Goals: Good    Frequency Min 3X/week   Barriers to discharge        Co-evaluation               End of Session   Activity Tolerance: Patient tolerated treatment well;Patient limited by fatigue Patient left: in bed;with call bell/phone within reach Nurse Communication: Mobility status         Time: [id number] PT Time Calculation (min) (ACUTE ONLY): 16 min   Charges:   PT Evaluation $Initial PT Evaluation Tier I: 1 Procedure     PT G CodesDespina Pole 09-02-2014, 7:29 PM Carita Pian. Sanjuana Kava, [place] Pager [phone]

## 2014-08-29 NOTE — Progress Notes (Signed)
2 Days Post-Op Procedure(s) (LRB): REPLACEMENT ASCENDING AORTA AND ARCH (N/A) TRANSESOPHAGEAL ECHOCARDIOGRAM (TEE) (N/A) RIGHT AXILLARY CANNULATION (N/A) Subjective: Some incisional pain  Objective: Vital signs in last 24 hours: Temp:  [98 F (36.7 C)-99.3 F (37.4 C)] 99.1 F (37.3 C) (04/16 0800) Pulse Rate:  [74-104] 88 (04/16 0900) Cardiac Rhythm:  [-] Normal sinus rhythm;Sinus tachycardia (04/16 0747) Resp:  [11-27] 22 (04/16 0900) BP: (101-143)/(71-99) 106/80 mmHg (04/16 0900) SpO2:  [91 %-100 %] 97 % (04/16 0900) Weight:  [211 lb 3.2 oz (95.8 kg)] 211 lb 3.2 oz (95.8 kg) (04/16 0500)  Hemodynamic parameters for last 24 hours:    Intake/Output from previous day: 04/15 0701 - 04/16 0700 In: 748.4 [P.O.:360; I.V.:188.4; IV Piggyback:200] Out: 1900 [Urine:1680; Chest Tube:220] Intake/Output this shift: Total I/O In: 300 [P.O.:300] Out: 90 [Urine:90]  General appearance: alert, cooperative and no distress Neurologic: intact Heart: regular rate and rhythm Lungs: diminished breath sounds bibasilar Abdomen: normal findings: soft, non-tender  Lab Results:  Recent Labs  08/28/14 1658 08/29/14 0410  WBC 8.9 9.2  HGB 9.6* 9.4*  HCT 29.2* 28.8*  PLT 133* 136*   BMET:  Recent Labs  08/28/14 0432 08/28/14 1657 08/28/14 1658 08/29/14 0410  NA 139 136  --  135  K 5.1 4.4  --  4.2  CL 109 104  --  102  CO2 22  --   --  24  GLUCOSE 106* 140*  --  116*  BUN 15 17  --  15  CREATININE 1.13 1.10 1.12 1.09  CALCIUM 8.3*  --   --  8.0*    PT/INR:  Recent Labs  08/27/14 1500  LABPROT 18.6*  INR 1.53*   ABG    Component Value Date/Time   PHART 7.379 08/28/2014 0209   HCO3 24.3* 08/28/2014 0209   TCO2 19 08/28/2014 1657   ACIDBASEDEF 1.0 08/28/2014 0209   O2SAT 97.0 08/28/2014 0209   CBG (last 3)   Recent Labs  08/29/14 0016 08/29/14 0423 08/29/14 0827  GLUCAP 122* 112* 105*    Assessment/Plan: S/P Procedure(s) (LRB): REPLACEMENT ASCENDING AORTA  AND ARCH (N/A) TRANSESOPHAGEAL ECHOCARDIOGRAM (TEE) (N/A) RIGHT AXILLARY CANNULATION (N/A) -  CV- stable  RESP- chest x-ray shows improved LLL atelectasis- continue IS  RENAL- lytes and creatinine Ok, dc foley  ENDO- CBG well controlled  Anemia- mild, follow  Limited mobility due to hip- will keep in SICU today to mobilize  PT consult   LOS: 2 days    Melrose Nakayama 08/29/2014

## 2014-08-29 NOTE — Progress Notes (Signed)
      [address]       [place] [address]             [phone]      Comfortable  BP 115/55 mmHg  Pulse 100  Temp(Src) 98.8 F (37.1 C) (Oral)  Resp 17  Ht 5\' 8"  (1.727 m)  Wt 211 lb 3.2 oz (95.8 kg)  BMI 32.12 kg/m2  SpO2 97%   Intake/Output Summary (Last 24 hours) at 08/29/14 1918 Last data filed at 08/29/14 1800  Gross per 24 hour  Intake   1260 ml  Output   1615 ml  Net   -355 ml   CBG OK  Got up with PT but unable to ambulate  Remo Lipps C. Roxan Hockey, MD [facility] [phone]

## 2014-08-30 ENCOUNTER — Inpatient Hospital Stay (HOSPITAL_COMMUNITY): Payer: Medicare Other

## 2014-08-30 DIAGNOSIS — I712 Thoracic aortic aneurysm, without rupture: Secondary | ICD-10-CM | POA: Diagnosis not present

## 2014-08-30 DIAGNOSIS — J9811 Atelectasis: Secondary | ICD-10-CM | POA: Diagnosis not present

## 2014-08-30 DIAGNOSIS — J986 Disorders of diaphragm: Secondary | ICD-10-CM | POA: Diagnosis not present

## 2014-08-30 LAB — BASIC METABOLIC PANEL
ANION GAP: 8 (ref 5–15)
BUN: 13 mg/dL (ref 6–23)
CALCIUM: 8 mg/dL — AB (ref 8.4–10.5)
CHLORIDE: 101 mmol/L (ref 96–112)
CO2: 23 mmol/L (ref 19–32)
Creatinine, Ser: 0.93 mg/dL (ref 0.50–1.35)
GFR calc Af Amer: >90 mL/min (ref >90)
GFR calc non Af Amer: 86 mL/min — ABNORMAL LOW (ref >90)
Glucose, Bld: 106 mg/dL — ABNORMAL HIGH (ref 70–99)
Potassium: 4.3 mmol/L (ref 3.5–5.1)
SODIUM: 132 mmol/L — AB (ref 135–145)

## 2014-08-30 LAB — GLUCOSE, CAPILLARY
GLUCOSE-CAPILLARY: 103 mg/dL — AB (ref 70–99)
Glucose-Capillary: 94 mg/dL (ref 70–99)
Glucose-Capillary: 101 mg/dL — ABNORMAL HIGH (ref 70–99)
Glucose-Capillary: 99 mg/dL (ref 70–99)
Glucose-Capillary: 116 mg/dL — ABNORMAL HIGH (ref 70–99)

## 2014-08-30 LAB — CBC
HEMATOCRIT: 28.2 % — AB (ref 39.0–52.0)
HEMOGLOBIN: 9.1 g/dL — AB (ref 13.0–17.0)
MCH: 27.6 pg (ref 26.0–34.0)
MCHC: 32.3 g/dL (ref 30.0–36.0)
MCV: 85.5 fL (ref 78.0–100.0)
Platelets: 144 10*3/uL — ABNORMAL LOW (ref 150–400)
RBC: 3.3 MIL/uL — AB (ref 4.22–5.81)
RDW: 15 % (ref 11.5–15.5)
WBC: 9.1 10*3/uL (ref 4.0–10.5)

## 2014-08-30 NOTE — Progress Notes (Signed)
      [address]       [place] [address]             [phone]      No complaints  BP 127/89 mmHg  Pulse 94  Temp(Src) 98.2 F (36.8 C) (Oral)  Resp 24  Ht 5\' 8"  (1.727 m)  Wt 207 lb 3.7 oz (94 kg)  BMI 31.52 kg/m2  SpO2 95%   Intake/Output Summary (Last 24 hours) at 08/30/14 1840 Last data filed at 08/30/14 1600  Gross per 24 hour  Intake    600 ml  Output   1250 ml  Net   -650 ml    Did much better today Ambulated around unit  Should be able to transfer to 2 west in [place]. Roxan Hockey, MD [facility] [phone]

## 2014-08-30 NOTE — Progress Notes (Signed)
Physical Therapy Treatment Patient Details Name: BOWIE SCELSI MRN: [id number] DOB: 08/09/1950 Today's Date: 08/30/2014    History of Present Illness Patient is a 64 yo male admitted 08/27/14 with progressive enlargement of the ascending aorta with pseudoaneurysm formation.  Patient is s/p Replacement of the ascending aorta and part of all of the aortic arch using circulatory arrest and right axillary cannulation.   PMH:  Ascending and descending aortic dissection, HTN, Chest pain, aseptic necrosis Bil hips, polysubstance abuse    PT Comments    Patient making progress with mobility and gait.  Needs reminders for precautions and safety.  Follow Up Recommendations  Home health PT;Supervision/Assistance - 24 hour     Equipment Recommendations  Rolling walker with 5" wheels    Recommendations for Other Services       Precautions / Restrictions Precautions Precautions: Sternal Restrictions Weight Bearing Restrictions: No    Mobility  Bed Mobility Overal bed mobility: Needs Assistance Bed Mobility: Rolling;Sidelying to Sit Rolling: Min assist Sidelying to sit: Mod assist       General bed mobility comments: Verbal cues for technique to maintain sternal precautions.  Assist to raise trunk to sitting position.  Transfers Overall transfer level: Needs assistance Equipment used: Rolling walker (2 wheeled) Transfers: Sit to/from Stand Sit to Stand: Min assist         General transfer comment: Verbal cues for technique/hand placement to maintain precautions.  Assist to steady during transfer.  Ambulation/Gait Ambulation/Gait assistance: Min assist Ambulation Distance (Feet): 120 Feet Assistive device: Rolling walker (2 wheeled) Gait Pattern/deviations: Step-through pattern;Decreased stride length;Antalgic Gait velocity: decreased Gait velocity interpretation: Below normal speed for age/gender General Gait Details: Patient demonstrates safe use of RW.  Cues to minimize  weightbearing on UE's during gait.   Stairs            Wheelchair Mobility    Modified Rankin (Stroke Patients Only)       Balance                                    Cognition Arousal/Alertness: Awake/alert Behavior During Therapy: WFL for tasks assessed/performed Overall Cognitive Status: Within Functional Limits for tasks assessed                      Exercises      General Comments        Pertinent Vitals/Pain Pain Assessment: 0-10 Pain Score: 4  Pain Location: chest wall and LLE Pain Descriptors / Indicators: Aching Pain Intervention(s): Limited activity within patient's tolerance;Repositioned    Home Living                      Prior Function            PT Goals (current goals can now be found in the care plan section) Progress towards PT goals: Progressing toward goals    Frequency  Min 3X/week    PT Plan Current plan remains appropriate    Co-evaluation             End of Session   Activity Tolerance: Patient tolerated treatment well Patient left: in chair;with call bell/phone within reach     Time: 1341-1357 PT Time Calculation (min) (ACUTE ONLY): 16 min  Charges:  $Gait Training: 8-22 mins  G Codes:      Despina Pole 08/30/2014, 6:13 PM Carita Pian. Sanjuana Kava, [place] Pager [phone]

## 2014-08-30 NOTE — Progress Notes (Signed)
3 Days Post-Op Procedure(s) (LRB): REPLACEMENT ASCENDING AORTA AND ARCH (N/A) TRANSESOPHAGEAL ECHOCARDIOGRAM (TEE) (N/A) RIGHT AXILLARY CANNULATION (N/A) Subjective: No complaints at present  Objective: Vital signs in last 24 hours: Temp:  [97.1 F (36.2 C)-99.7 F (37.6 C)] 97.1 F (36.2 C) (04/17 0800) Pulse Rate:  [66-100] 92 (04/17 0914) Cardiac Rhythm:  [-] Normal sinus rhythm (04/17 0800) Resp:  [13-27] 18 (04/17 0914) BP: (94-125)/(55-89) 103/78 mmHg (04/17 0914) SpO2:  [91 %-100 %] 97 % (04/17 0914) Weight:  [207 lb 3.7 oz (94 kg)] 207 lb 3.7 oz (94 kg) (04/17 0500)  Hemodynamic parameters for last 24 hours:    Intake/Output from previous day: 04/16 0701 - 04/17 0700 In: 900 [P.O.:900] Out: 1290 [Urine:1290] Intake/Output this shift: Total I/O In: 240 [P.O.:240] Out: 150 [Urine:150]  General appearance: alert, cooperative and no distress Neurologic: intact Heart: regular rate and rhythm Lungs: diminished breath sounds bibasilar Abdomen: normal findings: soft, non-tender Wound clean and dry Lab Results:  Recent Labs  08/29/14 0410 08/30/14 0243  WBC 9.2 9.1  HGB 9.4* 9.1*  HCT 28.8* 28.2*  PLT 136* 144*   BMET:  Recent Labs  08/29/14 0410 08/30/14 0243  NA 135 132*  K 4.2 4.3  CL 102 101  CO2 24 23  GLUCOSE 116* 106*  BUN 15 13  CREATININE 1.09 0.93  CALCIUM 8.0* 8.0*    PT/INR:  Recent Labs  08/27/14 1500  LABPROT 18.6*  INR 1.53*   ABG    Component Value Date/Time   PHART 7.379 08/28/2014 0209   HCO3 24.3* 08/28/2014 0209   TCO2 19 08/28/2014 1657   ACIDBASEDEF 1.0 08/28/2014 0209   O2SAT 97.0 08/28/2014 0209   CBG (last 3)   Recent Labs  08/29/14 1628 08/29/14 2222 08/30/14 0848  GLUCAP 103* 114* 94    Assessment/Plan: S/P Procedure(s) (LRB): REPLACEMENT ASCENDING AORTA AND ARCH (N/A) TRANSESOPHAGEAL ECHOCARDIOGRAM (TEE) (N/A) RIGHT AXILLARY CANNULATION (N/A) Mobilize   CV- stable  RESP- continue IS  RENAL-  lytes and creatinine OK, continue lasix  ENDO CBG well controlled  Deconditioning and hip pain are primary issues at present- PT saw yesterday. HE requires 2 person assist to get OOB. Will keep in SICU to mobilize   LOS: 3 days    [place] Nakayama 08/30/2014

## 2014-08-31 ENCOUNTER — Encounter (HOSPITAL_COMMUNITY): Payer: Self-pay | Admitting: Dentistry

## 2014-08-31 ENCOUNTER — Inpatient Hospital Stay (HOSPITAL_COMMUNITY): Payer: Medicare Other

## 2014-08-31 DIAGNOSIS — Z9889 Other specified postprocedural states: Secondary | ICD-10-CM

## 2014-08-31 DIAGNOSIS — I712 Thoracic aortic aneurysm, without rupture: Secondary | ICD-10-CM

## 2014-08-31 DIAGNOSIS — K047 Periapical abscess without sinus: Secondary | ICD-10-CM | POA: Diagnosis not present

## 2014-08-31 LAB — GLUCOSE, CAPILLARY: Glucose-Capillary: 90 mg/dL (ref 70–99)

## 2014-08-31 LAB — TYPE AND SCREEN
ABO/RH(D): B POS
Antibody Screen: NEGATIVE
UNIT DIVISION: 0
UNIT DIVISION: 0
Unit division: 0
Unit division: 0

## 2014-08-31 MED ORDER — MOVING RIGHT ALONG BOOK
Freq: Once | Status: AC
  Administered 2014-08-31: 11:00:00
  Filled 2014-08-31: qty 1

## 2014-08-31 MED ORDER — BISACODYL 5 MG PO TBEC
10.0000 mg | DELAYED_RELEASE_TABLET | Freq: Every day | ORAL | Status: DC | PRN
  Administered 2014-09-01: 10 mg via ORAL
  Filled 2014-08-31: qty 2

## 2014-08-31 MED ORDER — SODIUM CHLORIDE 0.9 % IJ SOLN
3.0000 mL | Freq: Two times a day (BID) | INTRAMUSCULAR | Status: DC
  Administered 2014-08-31 – 2014-09-02 (×4): 3 mL via INTRAVENOUS

## 2014-08-31 MED ORDER — CHLORHEXIDINE GLUCONATE 0.12 % MT SOLN
15.0000 mL | Freq: Two times a day (BID) | OROMUCOSAL | Status: DC
  Administered 2014-08-31 – 2014-09-03 (×5): 15 mL via OROMUCOSAL
  Filled 2014-08-31 (×8): qty 15

## 2014-08-31 MED ORDER — SODIUM CHLORIDE 0.9 % IV SOLN: 250.0000 mL | INTRAVENOUS | Status: DC | PRN

## 2014-08-31 MED ORDER — BISACODYL 10 MG RE SUPP: 10.0000 mg | Freq: Every day | RECTAL | Status: DC | PRN

## 2014-08-31 MED ORDER — DOCUSATE SODIUM 100 MG PO CAPS
200.0000 mg | ORAL_CAPSULE | Freq: Every day | ORAL | Status: DC
  Administered 2014-08-31 – 2014-09-03 (×3): 200 mg via ORAL
  Filled 2014-08-31 (×4): qty 2

## 2014-08-31 MED ORDER — SODIUM CHLORIDE 0.9 % IJ SOLN: 3.0000 mL | INTRAMUSCULAR | Status: DC | PRN

## 2014-08-31 MED ORDER — METOPROLOL TARTRATE 25 MG PO TABS
25.0000 mg | ORAL_TABLET | Freq: Two times a day (BID) | ORAL | Status: DC
  Administered 2014-08-31 – 2014-09-03 (×7): 25 mg via ORAL
  Filled 2014-08-31: qty 1
  Filled 2014-08-31: qty 2
  Filled 2014-08-31 (×8): qty 1

## 2014-08-31 MED ORDER — ASPIRIN EC 325 MG PO TBEC
325.0000 mg | DELAYED_RELEASE_TABLET | Freq: Every day | ORAL | Status: DC
  Filled 2014-08-31: qty 1

## 2014-08-31 MED ORDER — ASPIRIN EC 325 MG PO TBEC
325.0000 mg | DELAYED_RELEASE_TABLET | Freq: Every day | ORAL | Status: DC
  Administered 2014-09-01 – 2014-09-03 (×2): 325 mg via ORAL
  Filled 2014-08-31 (×3): qty 1
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
## 2014-08-31 NOTE — Progress Notes (Signed)
4 Days Post-Op Procedure(s) (LRB): REPLACEMENT ASCENDING AORTA AND ARCH (N/A) TRANSESOPHAGEAL ECHOCARDIOGRAM (TEE) (N/A) RIGHT AXILLARY CANNULATION (N/A) Subjective:  Feels well. Pain under control  Only complaint is of loose upper right tooth that is painful. He had this problem preop but did not mention it.  Objective: Vital signs in last 24 hours: Temp:  [97.6 F (36.4 C)-98.5 F (36.9 C)] 97.9 F (36.6 C) (04/18 0753) Pulse Rate:  [77-114] 90 (04/18 0700) Cardiac Rhythm:  [-] Normal sinus rhythm (04/18 0500) Resp:  [11-28] 16 (04/18 0700) BP: (101-143)/(64-102) 117/98 mmHg (04/18 0700) SpO2:  [94 %-100 %] 97 % (04/18 0700) Weight:  [93.8 kg (206 lb 12.7 oz)] 93.8 kg (206 lb 12.7 oz) (04/18 0500)  Hemodynamic parameters for last 24 hours:    Intake/Output from previous day: 04/17 0701 - 04/18 0700 In: 720 [P.O.:720] Out: 1275 [Urine:1275] Intake/Output this shift:    General appearance: alert and cooperative Neurologic: intact Heart: regular rate and rhythm, S1, S2 normal, no murmur, click, rub or gallop Lungs: clear to auscultation bilaterally Extremities: extremities normal, atraumatic, no cyanosis or edema Wound: incisions ok  Lab Results:  Recent Labs  08/29/14 0410 08/30/14 0243  WBC 9.2 9.1  HGB 9.4* 9.1*  HCT 28.8* 28.2*  PLT 136* 144*   BMET:  Recent Labs  08/29/14 0410 08/30/14 0243  NA 135 132*  K 4.2 4.3  CL 102 101  CO2 24 23  GLUCOSE 116* 106*  BUN 15 13  CREATININE 1.09 0.93  CALCIUM 8.0* 8.0*    PT/INR: No results for input(s): LABPROT, INR in the last 72 hours. ABG    Component Value Date/Time   PHART 7.379 08/28/2014 0209   HCO3 24.3* 08/28/2014 0209   TCO2 19 08/28/2014 1657   ACIDBASEDEF 1.0 08/28/2014 0209   O2SAT 97.0 08/28/2014 0209   CBG (last 3)   Recent Labs  08/30/14 1225 08/30/14 1619 08/30/14 2159  GLUCAP 101* 99 116*    Assessment/Plan: S/P Procedure(s) (LRB): REPLACEMENT ASCENDING AORTA AND ARCH  (N/A) TRANSESOPHAGEAL ECHOCARDIOGRAM (TEE) (N/A) RIGHT AXILLARY CANNULATION (N/A) Will ask dental medicine to see him about this loose tooth since he just had replacement of his aorta with prosthetic grafts. Mobilize Plan for transfer to step-down: see transfer orders   LOS: 4 days    [name] 08/31/2014

## 2014-08-31 NOTE — Progress Notes (Addendum)
CARDIAC REHAB PHASE I   PRE:  Rate/Rhythm: 98 SR  BP:  Sitting: 125/89        SaO2: 100 RA  MODE:  Ambulation: 460 ft   POST:  Rate/Rhythm: 102 ST  BP:  Sitting: 136/95         SaO2: 100 RA  Pt ambulated 460 ft on RA, assist x1, rolling walker, gait belt, mild unsteady gait (baseline-h/o septic arthritis in both hips per pt), tolerated fair. Pt did c/o some SOB upon returning to room.  Pt states he feels like he "could be getting dizzy." BP slightly elevated. Pt has difficulty getting OOB and states he is concerned how he will get in and out of his bed at home as it is very high.  Pt states his significant other is retired and will be at home to help him after discharge.  Reviewed and reinforced IS use,sternal precautions and activity progression. Pt verbalized understanding. Pt declined to sit in recliner at this time, returned to bed with call light within reach.  [id number]   Lenna Sciara, RN, BSN 08/31/2014 1:47 PM

## 2014-08-31 NOTE — Progress Notes (Addendum)
CARE MANAGEMENT NOTE 08/31/2014  Patient:  DENVIL, ALEXIOU   Account Number:  [id number]  Date Initiated:  08/31/2014  Documentation initiated by:  [facility]  Subjective/Objective Assessment:     Action/Plan:   lives at home with girlfriend, Marrian Salvage   Anticipated DC Date:     Anticipated DC Plan:  [place]  CM consult      Choice offered to / List presented to:             Status of service:  In process, will continue to follow Medicare Important Message given?  YES (If response is "NO", the following Medicare IM given date fields will be blank) Date Medicare IM given:  08/31/2014 Medicare IM given by:  [facility] Date Additional Medicare IM given:   Additional Medicare IM given by:    Discharge Disposition:    Per UR Regulation:    If discussed at Long Length of Stay Meetings, dates discussed:    Comments:  08/31/2014 1730 NCM spoke to pt and states he does not need any DME at this time. Wants to wait closer to his dc date to see if he will need HH PT. Jonnie Finner RN CCM Case Mgmt phone [phone]

## 2014-09-01 MED ORDER — AMOXICILLIN 500 MG PO CAPS
500.0000 mg | ORAL_CAPSULE | Freq: Three times a day (TID) | ORAL | Status: DC
  Administered 2014-09-01 – 2014-09-03 (×8): 500 mg via ORAL
  Filled 2014-09-01 (×10): qty 1

## 2014-09-01 MED ORDER — LACTULOSE 10 GM/15ML PO SOLN
20.0000 g | Freq: Every day | ORAL | Status: DC | PRN
  Administered 2014-09-01: 20 g via ORAL
  Filled 2014-09-01 (×2): qty 30

## 2014-09-01 MED ORDER — CEFAZOLIN SODIUM-DEXTROSE 2-3 GM-% IV SOLR
2.0000 g | INTRAVENOUS | Status: DC
  Filled 2014-09-01 (×2): qty 50

## 2014-09-01 MED ORDER — AMLODIPINE BESYLATE 5 MG PO TABS
5.0000 mg | ORAL_TABLET | Freq: Every day | ORAL | Status: DC
  Administered 2014-09-01 – 2014-09-03 (×2): 5 mg via ORAL
  Filled 2014-09-01 (×3): qty 1

## 2014-09-01 NOTE — Progress Notes (Signed)
CARDIAC REHAB PHASE I   PRE:  Rate/Rhythm: 105 ST  BP:  Sitting: 115/82        SaO2: 99 RA  MODE:  Ambulation: 750 ft   POST:  Rate/Rhythm: 117 ST  BP:  Sitting: 123/82         SaO2: 100 RA  Pt ambulated 750 ft on RA, x1 assist, gait belt, rolling walker, mild unsteady gait at baseline d/t arthritis in hips, tolerated well.  Pt states he "wants to walk to the nurses station to ask for pain medicine." Pt c/o 6/10 chest surgical pain, RN notified. Pt ambulated greater distance today than yesterday seems to be moving better. Pt did c/o SOB on exertion, states same as yesterday, declined rest.  Upon returning to room pt states he is "a little bit tired." VSS.  Pt is somewhat impulsive, does not always follow sternal precautions, activity restrictions. Reinforced sternal precautions, IS. Pt returned to chair with call bell within reach.   [id number]  Lenna Sciara, RN, BSN 09/01/2014 8:45 AM

## 2014-09-01 NOTE — Progress Notes (Signed)
EPW removed, small drainage noted at atrial site, dressing applied. Kathleen Argue S 11:07 AM

## 2014-09-01 NOTE — Discharge Summary (Signed)
Physician Discharge Summary  Patient ID: Edward Hopkins MRN: [id number] DOB/AGE: 02/04/1951 64 y.o.  Admit date: 08/27/2014 Discharge date: 09/03/2014  Admission Diagnoses:  Patient Active Problem List   Diagnosis Date Noted  . AKI (acute kidney injury) 08/06/2014  . Malnutrition of moderate degree 08/05/2014  . Orthostatic hypotension 08/04/2014  . Hypotension 08/04/2014  . Femoral DVT (deep venous thrombosis)   . Intramural aortic hematoma 07/17/2014   Discharge Diagnoses:   Patient Active Problem List   Diagnosis Date Noted  . S/P ascending aortic replacement 08/27/2014  . AKI (acute kidney injury) 08/06/2014  . Malnutrition of moderate degree 08/05/2014  . Orthostatic hypotension 08/04/2014  . Hypotension 08/04/2014  . Femoral DVT (deep venous thrombosis)   . Intramural aortic hematoma 07/17/2014   Discharged Condition: good  History of Present Illness:  Edward Hopkins is a 64 yo African American Male with history of polysubstance and nicotine abuse. He also has a history of Endocarditis which was treated with IV antibiotics. He presented to the ED on 07/17/2014 with complaints of chest pain, that developed after use of cocaine and taking shots of gin while playing cards. Evaluation in the ED showed the patient to be hypertensive and there was concern he was suffering from an Aortic Dissection. He was taken for CTA chest which showed an ascending and descending aortic dissection which a mediastinal hematoma. Due to this Cardiac surgery was consulted. The patient was evaluated by Dr. Cyndia Bent who personally reviewed his CTA and felt the patient to have an acute aortic intramural hematoma. It was felt this was due to untreated hypertension with use of cocaine and alcohol prior to presentation. It was not felt that emergent surgery would be indicated and was admitted to the SICU for blood pressure control.  During that hospitalization the patient was noted to have a DVT for which he  underwent placement of an IVC filter due to inability to be anticoagulated.  He blood pressure was monitored and well controlled with use of Labatelol.  He initially did well however he was readmitted to the hospital on 08/04/2014 with complaints of dizziness and orthostasis.  Workup was negative but his blood pressure medication was adjusted.  He was again discharged with no further issues.  He presented for follow up with Dr. Cyndia Bent on 08/22/2014 at which time it was felt he should undergo surgical intervention on his Aorta.  It was recommended he have replacement of his ascending aorta and aortic arch under circulatory arrest.  The risks and benefits of the procedure were explained to the patient and he was agreeable to proceed.  Hospital Course:   Edward Hopkins presented to [facility] on 08/27/2014.  He was taken to the operating room and underwent Replacement of the Ascending Aorta and Aortic Arch under circulatory arrest.  He also had right axillary cannulation.  He tolerated the procedure without difficulty and was taken to the SICU in stable condition.  During his stay in the SICU the patient was extubated.  He was weaned off pressor support and placed on Lopressor for blood pressure control.  His chest tubes and arterial lines were removed without difficulty.  He has difficulty ambulating due to hip arthritis.  He has participated with PT who recommends use of a rolling walker.  Once medically stable he was transferred to telemetry unit for further care.    He complained of pain in one of his upper level teeth.  Dental xray was obtained and showed evidence of an  abscess.  Dr. Enrique Sack evaluated the patient and is planning for extraction on POD #5.  He was placed on Amoxicillin for empiric ABX coverage of his dental abscess.  He developed some mild hypertension and was started on low dose Norvasc.  He continues to ambulate short amounts due to his hip arthritis.  He is otherwise medically stable for  discharge on 09/03/2014.         Consults: Dental Surgery  Significant Diagnostic Studies: radiology:   CT scan:  1. Progressive abnormality involving the ascending thoracic aorta with multiple areas of penetrating ulcer disease now identified into the anterior aortic wall with a more focal true dissection seen in the distal ascending aorta. Maximal diameter of the ascending thoracic aorta is now 5.8 cm. Dissection and dilatation does not involve the sinuses of Valsalva. 2. Near resolution of mediastinal hemorrhage since prior imaging with no evidence of new hemorrhage. 3. Improved appearance of intramural hemorrhage involving the aortic arch and proximal descending thoracic aorta without progressive distal component of dissection or penetrating ulcer disease. 4. Resolution of nonocclusive left lower lobe pulmonary embolism. 5. No acute findings involving the aorta or its branches in the abdomen.  Treatments: surgery:   1. Median Sternotomy 2. Right axillary artery cannulation using an 8 mm Hemashield graft. 3. Extracorporeal circulation via the right axillary artery and right atrium 4. Replacement of ascending aorta and aortic arch with aorto-innominate and aorto-left carotid bypass using a 30 x 10 x 8 x 8 x 10 mm Hemashield Platinum graft. Supra-coronary proximal anastomosis.  Disposition: 01-Home or Self Care   Discharge Medications:  The patient has been discharged on:   1.Beta Blocker:  Yes [ x  ]                              No   [   ]                              If No, reason:  2.Ace Inhibitor/ARB: Yes [   ]                                     No  [  x  ]                                     If No, reason: No CAD, mild creatinine elevation  3.Statin:   Yes [   ]                  No  [  x ]                  If No, reason: No CAD  4.S[place]a [place]:  Yes  [  x ]                  No   [   ]                  If No, reason:    Medication List    STOP taking these  medications        labetalol 200 MG tablet  Commonly known as:  NORMODYNE      TAKE these medications  amLODipine 10 MG tablet  Commonly known as:  NORVASC  Take 1 tablet (10 mg total) by mouth daily.     amoxicillin 500 MG capsule  Commonly known as:  AMOXIL  Take 1 capsule (500 mg total) by mouth every 8 (eight) hours. For 5 Days     aspirin 325 MG EC tablet  Take 1 tablet (325 mg total) by mouth daily.     metoprolol tartrate 25 MG tablet  Commonly known as:  LOPRESSOR  Take 1 tablet (25 mg total) by mouth 2 (two) times daily.     oxyCODONE 5 MG immediate release tablet  Commonly known as:  Oxy IR/ROXICODONE  Take 1-2 tablets (5-10 mg total) by mouth every 3 (three) hours as needed for severe pain.          Follow-up Information    Follow up with [name], MD On 09/30/2014.   Specialty:  Cardiothoracic Surgery   Why:  Appointment is at 10:30   Contact information:   [place] [address] [phone]       Follow up with [facility] On 09/30/2014.   Why:  Please get CXR at 10:00   Contact information:   [facility]       Follow up with Lenn Cal, DDS On 10/08/2014.   Specialty:  Dentistry   Why:  For suture removal, For wound re-check   Contact information:   [place] [address] [phone]       Follow up with PCP .   Why:  Please follow up as scheduled for blood pressure control      Signed: Kylin Genna 09/03/2014, 7:54 AM

## 2014-09-01 NOTE — Progress Notes (Addendum)
PT Cancellation Note  Patient Details Name: Edward Hopkins MRN: [id number] DOB: 12-Sep-1950   Cancelled Treatment:    Reason Eval/Treat Not Completed: Fatigue/lethargy limiting ability to participate. Pt just finished participating with cardiac rehab and is very fatigued at this time. Will check back with pt as time allows.    Rolinda Roan 09/01/2014, 9:52 AM   Addendum: Attempted back with pt at 1130 and pt had pacing wires pulled. Currently on bedrest. Will continue to check back as schedule allows.   Rolinda Roan, PT, DPT Acute Rehabilitation Services Pager: [phone]

## 2014-09-01 NOTE — Progress Notes (Addendum)
      [address]       [place] [address]             [phone]      5 Days Post-Op Procedure(s) (LRB): REPLACEMENT ASCENDING AORTA AND ARCH (N/A) TRANSESOPHAGEAL ECHOCARDIOGRAM (TEE) (N/A) RIGHT AXILLARY CANNULATION (N/A)   Subjective:  [name] continues to complain of dental pain and mild sternal pain.  He is otherwise doing well.  He is ambulating with assistance.  + BM  Objective: Vital signs in last 24 hours: Temp:  [97.9 F (36.6 C)-99.3 F (37.4 C)] 99.2 F (37.3 C) (04/19 0700) Pulse Rate:  [87-115] 93 (04/19 0700) Cardiac Rhythm:  [-] Normal sinus rhythm;Sinus tachycardia (04/19 0727) Resp:  [17-18] 18 (04/19 0700) BP: (110-139)/(81-98) 130/94 mmHg (04/19 0700) SpO2:  [94 %-100 %] 94 % (04/19 0700) Weight:  [204 lb 12.9 oz (92.9 kg)] 204 lb 12.9 oz (92.9 kg) (04/19 0700)  Intake/Output from previous day: 04/18 0701 - 04/19 0700 In: 660 [P.O.:660] Out: 1550 [Urine:1550]  General appearance: alert, cooperative and no distress Heart: regular rate and rhythm Lungs: clear to auscultation bilaterally Abdomen: soft, non-tender; bowel sounds normal; no masses,  no organomegaly Extremities: edema none appreciated  Wound: clean and dry  Lab Results:  Recent Labs  08/30/14 0243  WBC 9.1  HGB 9.1*  HCT 28.2*  PLT 144*   BMET:  Recent Labs  08/30/14 0243  NA 132*  K 4.3  CL 101  CO2 23  GLUCOSE 106*  BUN 13  CREATININE 0.93  CALCIUM 8.0*    PT/INR: No results for input(s): LABPROT, INR in the last 72 hours. ABG    Component Value Date/Time   PHART 7.379 08/28/2014 0209   HCO3 24.3* 08/28/2014 0209   TCO2 19 08/28/2014 1657   ACIDBASEDEF 1.0 08/28/2014 0209   O2SAT 97.0 08/28/2014 0209   CBG (last 3)   Recent Labs  08/30/14 1619 08/30/14 2159 08/31/14 0748  GLUCAP 99 116* 90    Assessment/Plan: S/P Procedure(s) (LRB): REPLACEMENT ASCENDING AORTA AND ARCH (N/A) TRANSESOPHAGEAL ECHOCARDIOGRAM (TEE) (N/A) RIGHT  AXILLARY CANNULATION (N/A)  1. CV- NSR, mild hypertension- will continue Lopressor, will add Norvasc 2. Pulm- off oxygen, continue IS 3. Renal- creatinine WNL, weight is stable, no LE edema 4. ID- mildly febrile this morning, dental film shows abscessed tooth, will start Amoxicillin, Dr. Enrique Sack following, plan for extraction tomorrow 5. Dispo- patient doing well, will d/c EPW, Amoxicillin for abscess tooth/extraction tomorrow, likely d/c on Thursday if remains stable   LOS: 5 days    Ellwood Handler 09/01/2014   Chart reviewed, patient examined, agree with above.

## 2014-09-01 NOTE — Progress Notes (Signed)
Utilization review completed.  

## 2014-09-02 ENCOUNTER — Inpatient Hospital Stay (HOSPITAL_COMMUNITY): Payer: Medicare Other | Admitting: Anesthesiology

## 2014-09-02 ENCOUNTER — Encounter (HOSPITAL_COMMUNITY)
Admission: RE | Disposition: A | Discharge status: Home / Self Care | Payer: Self-pay | Source: Ambulatory Visit | Attending: Surgery

## 2014-09-02 DIAGNOSIS — K0889 Other specified disorders of teeth and supporting structures: Secondary | ICD-10-CM | POA: Diagnosis present

## 2014-09-02 DIAGNOSIS — K045 Chronic apical periodontitis: Secondary | ICD-10-CM | POA: Diagnosis present

## 2014-09-02 DIAGNOSIS — K0530 Chronic periodontitis, unspecified: Secondary | ICD-10-CM

## 2014-09-02 DIAGNOSIS — K088 Other specified disorders of teeth and supporting structures: Secondary | ICD-10-CM

## 2014-09-02 HISTORY — PX: MULTIPLE EXTRACTIONS WITH ALVEOLOPLASTY: SHX5342

## 2014-09-02 SURGERY — MULTIPLE EXTRACTION WITH ALVEOLOPLASTY
Anesthesia: Monitor Anesthesia Care | Site: Mouth

## 2014-09-02 MED ORDER — LIDOCAINE-EPINEPHRINE 2 %-1:100000 IJ SOLN
INTRAMUSCULAR | Status: DC | PRN
  Administered 2014-09-02: 3.4 mL via INTRADERMAL

## 2014-09-02 MED ORDER — LACTATED RINGERS IV SOLN: INTRAVENOUS | Status: DC

## 2014-09-02 MED ORDER — FENTANYL CITRATE (PF) 250 MCG/5ML IJ SOLN
INTRAMUSCULAR | Status: AC
  Filled 2014-09-02: qty 5

## 2014-09-02 MED ORDER — LACTATED RINGERS IV SOLN
INTRAVENOUS | Status: DC | PRN
Start: 2014-09-02 — End: 2014-09-02
  Administered 2014-09-02: 11:00:00 via INTRAVENOUS

## 2014-09-02 MED ORDER — PROMETHAZINE HCL 25 MG/ML IJ SOLN: 6.2500 mg | INTRAMUSCULAR | Status: DC | PRN

## 2014-09-02 MED ORDER — CEFAZOLIN SODIUM-DEXTROSE 2-3 GM-% IV SOLR
INTRAVENOUS | Status: DC | PRN
  Administered 2014-09-02: 2 g via INTRAVENOUS

## 2014-09-02 MED ORDER — LACTATED RINGERS IV SOLN
INTRAVENOUS | Status: DC
  Administered 2014-09-02: 10:00:00 via INTRAVENOUS

## 2014-09-02 MED ORDER — LIDOCAINE-EPINEPHRINE 2 %-1:100000 IJ SOLN
INTRAMUSCULAR | Status: AC
  Filled 2014-09-02: qty 3.4

## 2014-09-02 MED ORDER — HYDROMORPHONE HCL 1 MG/ML IJ SOLN: 0.2500 mg | INTRAMUSCULAR | Status: DC | PRN

## 2014-09-02 MED ORDER — 0.9 % SODIUM CHLORIDE (POUR BTL) OPTIME
TOPICAL | Status: DC | PRN
  Administered 2014-09-02: 1000 mL

## 2014-09-02 MED ORDER — MIDAZOLAM HCL 2 MG/2ML IJ SOLN
INTRAMUSCULAR | Status: AC
  Filled 2014-09-02: qty 2

## 2014-09-02 SURGICAL SUPPLY — 35 items
ALCOHOL 70% 16 OZ (MISCELLANEOUS) ×3 IMPLANT
ATTRACTOMAT 16X20 MAGNETIC DRP (DRAPES) ×3 IMPLANT
BLADE SURG 15 STRL LF DISP TIS (BLADE) ×2 IMPLANT
BLADE SURG 15 STRL SS (BLADE) ×4
COVER SURGICAL LIGHT HANDLE (MISCELLANEOUS) ×3 IMPLANT
GAUZE PACKING FOLDED 2  STR (GAUZE/BANDAGES/DRESSINGS) ×2
GAUZE PACKING FOLDED 2 STR (GAUZE/BANDAGES/DRESSINGS) ×1 IMPLANT
GAUZE SPONGE 4X4 12PLY STRL (GAUZE/BANDAGES/DRESSINGS) ×3 IMPLANT
GAUZE SPONGE 4X4 16PLY XRAY LF (GAUZE/BANDAGES/DRESSINGS) ×3 IMPLANT
GLOVE BIOGEL PI IND STRL 6 (GLOVE) ×1 IMPLANT
GLOVE BIOGEL PI INDICATOR 6 (GLOVE) ×2
GLOVE SURG ORTHO 8.0 STRL STRW (GLOVE) ×3 IMPLANT
GLOVE SURG SS PI 6.0 STRL IVOR (GLOVE) ×3 IMPLANT
GOWN STRL REUS W/ TWL LRG LVL3 (GOWN DISPOSABLE) ×1 IMPLANT
GOWN STRL REUS W/TWL 2XL LVL3 (GOWN DISPOSABLE) ×3 IMPLANT
GOWN STRL REUS W/TWL LRG LVL3 (GOWN DISPOSABLE) ×2
HEMOSTAT SURGICEL 2X14 (HEMOSTASIS) IMPLANT
KIT BASIN OR (CUSTOM PROCEDURE TRAY) ×3 IMPLANT
KIT ROOM TURNOVER OR (KITS) ×3 IMPLANT
MANIFOLD NEPTUNE WASTE (CANNULA) ×3 IMPLANT
NEEDLE BLUNT 16X1.5 OR ONLY (NEEDLE) ×3 IMPLANT
NS IRRIG 1000ML POUR BTL (IV SOLUTION) ×3 IMPLANT
PACK EENT II TURBAN DRAPE (CUSTOM PROCEDURE TRAY) ×3 IMPLANT
PAD ARMBOARD 7.5X6 YLW CONV (MISCELLANEOUS) ×3 IMPLANT
SPONGE SURGIFOAM ABS GEL 100 (HEMOSTASIS) IMPLANT
SPONGE SURGIFOAM ABS GEL 12-7 (HEMOSTASIS) IMPLANT
SPONGE SURGIFOAM ABS GEL SZ50 (HEMOSTASIS) IMPLANT
SUCTION FRAZIER TIP 10 FR DISP (SUCTIONS) ×3 IMPLANT
SUT CHROMIC 3 0 PS 2 (SUTURE) ×3 IMPLANT
SUT SILK 0 TIES 10X30 (SUTURE) ×3 IMPLANT
SYR 50ML SLIP (SYRINGE) ×3 IMPLANT
TOWEL OR 17X26 10 PK STRL BLUE (TOWEL DISPOSABLE) ×3 IMPLANT
TUBE CONNECTING 12'X1/4 (SUCTIONS) ×1
TUBE CONNECTING 12X1/4 (SUCTIONS) ×2 IMPLANT
YANKAUER SUCT BULB TIP NO VENT (SUCTIONS) ×3 IMPLANT

## 2014-09-02 NOTE — Progress Notes (Addendum)
      [address]       [place] [address]             [phone]      6 Days Post-Op Procedure(s) (LRB): REPLACEMENT ASCENDING AORTA AND ARCH (N/A) TRANSESOPHAGEAL ECHOCARDIOGRAM (TEE) (N/A) RIGHT AXILLARY CANNULATION (N/A)   Subjective:  [name] continues to do well.  He is scheduled to have his tooth removed today at 10 Objective: Vital signs in last 24 hours: Temp:  [98.7 F (37.1 C)-98.9 F (37.2 C)] 98.9 F (37.2 C) (04/20 0435) Pulse Rate:  [72-110] 84 (04/20 0435) Cardiac Rhythm:  [-] Normal sinus rhythm;Bundle branch block (04/19 1950) Resp:  [18] 18 (04/20 0435) BP: (109-146)/(77-99) 135/96 mmHg (04/20 0435) SpO2:  [94 %-98 %] 98 % (04/20 0435) Weight:  [205 lb 0.4 oz (93 kg)] 205 lb 0.4 oz (93 kg) (04/20 0435)  Intake/Output from previous day: 04/19 0701 - 04/20 0700 In: 723 [P.O.:720; I.V.:3] Out: 1900 [Urine:1900]  General appearance: alert, cooperative and no distress Heart: regular rate and rhythm Lungs: clear to auscultation bilaterally Abdomen: soft, non-tender; bowel sounds normal; no masses,  no organomegaly Extremities: edema trace Wound: clean and dry  Lab Results: No results for input(s): WBC, HGB, HCT, PLT in the last 72 hours. BMET: No results for input(s): NA, K, CL, CO2, GLUCOSE, BUN, CREATININE, CALCIUM in the last 72 hours.  PT/INR: No results for input(s): LABPROT, INR in the last 72 hours. ABG    Component Value Date/Time   PHART 7.379 08/28/2014 0209   HCO3 24.3* 08/28/2014 0209   TCO2 19 08/28/2014 1657   ACIDBASEDEF 1.0 08/28/2014 0209   O2SAT 97.0 08/28/2014 0209   CBG (last 3)   Recent Labs  08/30/14 1619 08/30/14 2159 08/31/14 0748  GLUCAP 99 116* 90    Assessment/Plan: S/P Procedure(s) (LRB): REPLACEMENT ASCENDING AORTA AND ARCH (N/A) TRANSESOPHAGEAL ECHOCARDIOGRAM (TEE) (N/A) RIGHT AXILLARY CANNULATION (N/A)  1. CV- NSR, pressure is labile, but slowly trending upward- on Lopressor, Norvasc,  will monitor can increase Norvasc if pressure remains elevated 2. Pulm- no acute issues, continue IS 3. Renal- weight is stable, minimal LE edema not currently on diuresis 4. ID- Abscessed tooth, on Amoxicillin, plan for Dental extraction this morning 5. Dispo- patient continues to do well, will plan to d/c tomorrow if remains stable after dental procedure   LOS: 6 days    Ellwood Handler 09/02/2014   Chart reviewed, patient examined, agree with above. I will need to see him in the office in a couple weeks. He does not have a medical doctor but he needs one to follow his BP. See if he can be set up in medical clinic here at [facility].

## 2014-09-02 NOTE — Progress Notes (Signed)
PRE-OPERATIVE NOTE:  09/02/2014   [place] [id number]  VITALS: BP 135/96 mmHg  Pulse 84  Temp(Src) 98.9 F (37.2 C) (Oral)  Resp 18  Ht 5\' 8"  (1.727 m)  Wt 205 lb 0.4 oz (93 kg)  BMI 31.18 kg/m2  SpO2 98%  Lab Results  Component Value Date   WBC 9.1 08/30/2014   HGB 9.1* 08/30/2014   HCT 28.2* 08/30/2014   MCV 85.5 08/30/2014   PLT 144* 08/30/2014   BMET    Component Value Date/Time   NA 132* 08/30/2014 0243   K 4.3 08/30/2014 0243   CL 101 08/30/2014 0243   CO2 23 08/30/2014 0243   GLUCOSE 106* 08/30/2014 0243   BUN 13 08/30/2014 0243   CREATININE 0.93 08/30/2014 0243   CALCIUM 8.0* 08/30/2014 0243   GFRNONAA 86* 08/30/2014 0243   GFRAA >90 08/30/2014 0243    Lab Results  Component Value Date   INR 1.53* 08/27/2014   INR 1.16 08/24/2014   INR 1.15 07/17/2014   No results found for: PTT   Edward Hopkins presents for extraction of indicted tooth/teeth with alveoloplasty in the operating room.   SUBJECTIVE: The patient denies any acute medical or dental changes and agrees to proceed with treatment as planned.  EXAM: No sign of acute dental changes.  ASSESSMENT: Patient is affected by chronic apical periodontitis, chronic periodontitis, and significant tooth mobility.  PLAN: Patient agrees to proceed with treatment as planned in the operating room as previously discussed and accepts the risks, benefits, and complications of the proposed treatment. Patient is aware of the risk for bleeding, bruising, swelling, infection, pain, nerve damage, soft tissue damage, sinus involvement, root tip fracture, mandible fracture, and the risks of complications associated with the anesthesia. Patient also is aware of the potential for other complications not mentioned above.   Lenn Cal, DDS

## 2014-09-02 NOTE — Op Note (Signed)
OPERATIVE REPORT  Patient:            Edward Hopkins Date of Birth:  1950/09/08 MRN:                [id number]   DATE OF PROCEDURE:  09/02/2014  PREOPERATIVE DIAGNOSES: 1. Aortic aneurysm 2. S/P Aortic root replacement 3. Chronic apical periodontitis 4. Chronic periodontitis 5. Tooth mobility 6. History of acute pulpitis  POSTOPERATIVE DIAGNOSES: 1. Aortic aneurysm 2. S/P Aortic root replacement 3. Chronic apical periodontitis 4. Chronic periodontitis 5. Tooth mobility 6. History of acute pulpitis  OPERATIONS: 1. Extraction of tooth #4 with alveoloplasty    SURGEON: Lenn Cal, DDS  ASSISTANT: Camie Patience, (dental assistant)  ANESTHESIA: Local anesthesia only   MEDICATIONS: 1. Ancef 2 g IV prior to invasive dental procedures. 2. Local anesthesia with a total utilization of 2 carpules each containing 34 mg of lidocaine with 0.017 mg of epinephrine   SPECIMENS: There was one tooth that was discarded.  DRAINS: None  CULTURES: None  COMPLICATIONS: None   ESTIMATED BLOOD LOSS: 5 mls  INTRAVENOUS FLUIDS: Lactated ringers solution per the anesthesia team record  INDICATIONS: The patient was recently diagnosed with an aortic aneurysm. Dr. Cyndia Bent performed ain aortic root and arch replacement.  A dental consultation was then requested to evaluate any loose tooth and history of acute pulpitis.  The patient was examined and treatment planned for extraction of indicated teeth with alveoloplasty as needed.  This treatment plan was formulated to decrease the risks and complications associated with dental infection from affecting the patient's systemic health and the previous aortic root replacement.  OPERATIVE FINDINGS: Patient was examined operating room number 2.  Tooth #4 was identified for extraction. Other dental caries were noted and the patient was instructed to follow up the dentist of his choice for exam, radiographs, and restoration of the indicated teeth.  The patient was noted be affected by chronic periodontitis, tooth mobility, chronic apical periodontitis, history of acute pulpitis, multiple dental caries and missing teeth.   DESCRIPTION OF PROCEDURE: Patient was brought to the main operating room number 2. Patient was then placed in the supine position on the operating table. Monitored anesthesia care was then induced per the anesthesia team. The patient was then prepped and draped in the usual manner for dental medicine procedure. A timeout was performed. The patient was identified and procedures were verified.  The oral cavity was then thoroughly examined with the findings noted above. The patient was then ready for dental medicine procedure as follows:  Local anesthesia was then administered with a total utilization of 2 carpules each containing 34 mg of lidocaine with 0.017 mg of epinephrine.  The Maxillary right quadrant was first approached. Anesthesia was then delivered utilizing infiltration with lidocaine with epinephrine. Tooth #4 was then extracted with a 150 forceps without complications. A  #15 blade incision was then made from the  distal of #3 to the mesial of number 5.  Multiple wedge incisions were made to remove redundant tissue. Alveoloplasty was then performed utilizing a ronguers and bone file. Excessive granulation tissue was removed at this time. The surgical site was then irrigated with copious amounts of sterile saline. The tissues were approximated and trimmed appropriately. The surgical site was then closed from the  distal of #3 and extended the mesial #5 utilizing 3-0 chromic gut suture in a continuous interrupted suture technique 1.  At this point time, the entire mouth was irrigated with copious amounts of  sterile saline. The patient was examined for complications, seeing none, the dental medicine procedure was deemed to be complete. The throat pack was removed at this time. A series of 4 x 4 gauze were placed in the  mouth to aid hemostasis. The patient was then handed over to the anesthesia team for final disposition. After an appropriate amount of time the patient taken to the postanesthsia care unit  in good condition. All counts were correct for the dental medicine procedure. The patient tolerated the procedure well and is acceptable for discharge from a dental standpoint at this time. Patient is to return to dental medicine in 7 days for evaluation for suture removal although the  sutures will dissolve on her own.  Lenn Cal, DDS.

## 2014-09-02 NOTE — Transfer of Care (Signed)
Immediate Anesthesia Transfer of Care Note  Patient: Edward Hopkins  Procedure(s) Performed: Procedure(s): EXTRACTION OF TOOTH #4 WITH  ALVEOLOPLASTY (N/A)  Patient Location: PACU  Anesthesia Type:MAC  Level of Consciousness: awake, alert , oriented and patient cooperative  Airway & Oxygen Therapy: Patient Spontanous Breathing  Post-op Assessment: Report given to RN and Post -op Vital signs reviewed and stable  Post vital signs: Reviewed and stable  Last Vitals:  Filed Vitals:   09/02/14 1145  BP: 129/97  Pulse: 79  Temp: 36.8 C  Resp: 19    Complications: No apparent anesthesia complications

## 2014-09-02 NOTE — Progress Notes (Signed)
CARDIAC REHAB PHASE I   PRE:  Rate/Rhythm: 83 SR  BP:  Supine:   Sitting: 151/105  Standing:    SaO2: 91-92%RA  MODE:  Ambulation: 510 ft   POST:  Rate/Rhythm: 990  BP:  Supine: 143/93  Sitting:   Standing:    SaO2: 100%RA 1432-1510 Pt willing to walk. Pt walked 510 ft on RA with rolling walker with fairly steady gait. Tolerated well. To bed after walk. Notified pt's RN of elevated BP.   Graylon Good, RN BSN  09/02/2014 3:09 PM

## 2014-09-02 NOTE — Anesthesia Preprocedure Evaluation (Addendum)
Anesthesia Evaluation  Patient identified by MRN, date of birth, ID band Patient awake    Reviewed: Allergy & Precautions, NPO status , Patient's Chart, lab work & pertinent test results  History of Anesthesia Complications Negative for: history of anesthetic complications  Airway Mallampati: II  TM Distance: >3 FB Neck ROM: Full    Dental  (+) Poor Dentition   Pulmonary Current Smoker,    Pulmonary exam normal       Cardiovascular hypertension, + Peripheral Vascular Disease and +CHF     Neuro/Psych negative neurological ROS  negative psych ROS   GI/Hepatic negative GI ROS, Neg liver ROS,   Endo/Other  negative endocrine ROS  Renal/GU Renal InsufficiencyRenal disease     Musculoskeletal   Abdominal   Peds  Hematology negative hematology ROS (+)   Anesthesia Other Findings   Reproductive/Obstetrics                            Anesthesia Physical Anesthesia Plan  ASA: III  Anesthesia Plan: General   Post-op Pain Management:    Induction:   Airway Management Planned:   Additional Equipment:   Intra-op Plan:   Post-operative Plan: Extubation in OR  Informed Consent: I have reviewed the patients History and Physical, chart, labs and discussed the procedure including the risks, benefits and alternatives for the proposed anesthesia with the patient or authorized representative who has indicated his/her understanding and acceptance.   Dental advisory given  Plan Discussed with: CRNA, Anesthesiologist and Surgeon  Anesthesia Plan Comments:         Anesthesia Quick Evaluation

## 2014-09-02 NOTE — Progress Notes (Signed)
Physical Therapy Treatment Patient Details Name: Edward Hopkins MRN: [id number] DOB: Jul 10, 1950 Today's Date: 09/02/2014    History of Present Illness Patient is a 64 yo male admitted 08/27/14 with progressive enlargement of the ascending aorta with pseudoaneurysm formation.  Patient is s/p Replacement of the ascending aorta and part of all of the aortic arch using circulatory arrest and right axillary cannulation.   PMH:  Ascending and descending aortic dissection, HTN, Chest pain, aseptic necrosis Bil hips, polysubstance abuse    PT Comments    Pt walked 450' with RW independently. HR 97 at rest, 114 with walking, no SOB noted. Pt reports h/o B hip pain due to h/o septic arthritis. Encouraged pt to seek  orthopedic evaluation once he recovers from this surgery. He has met PT goals, he is modified independent with mobility, PT signing off.    Follow Up Recommendations  No PT follow up     Equipment Recommendations  Rolling walker with 5" wheels    Recommendations for Other Services       Precautions / Restrictions Precautions Precautions: Sternal Restrictions Weight Bearing Restrictions: No    Mobility  Bed Mobility Overal bed mobility: Modified Independent Bed Mobility: Rolling;Sidelying to Sit Rolling: Modified independent (Device/Increase time) Sidelying to sit: Modified independent (Device/Increase time)          Transfers Overall transfer level: Needs assistance Equipment used: Rolling walker (2 wheeled) Transfers: Sit to/from Stand Sit to Stand: Modified independent (Device/Increase time)            Ambulation/Gait Ambulation/Gait assistance: Modified independent (Device/Increase time) Ambulation Distance (Feet): 450 Feet Assistive device: Rolling walker (2 wheeled) Gait Pattern/deviations: Antalgic;Decreased stride length Gait velocity: decreased   General Gait Details: Patient demonstrates safe use of RW.  Per pt decr stride length is baseline due to  h/o B hip arthritis.    Stairs            Wheelchair Mobility    Modified Rankin (Stroke Patients Only)       Balance Overall balance assessment: Modified Independent                                  Cognition Arousal/Alertness: Awake/alert Behavior During Therapy: WFL for tasks assessed/performed Overall Cognitive Status: Within Functional Limits for tasks assessed                      Exercises      General Comments        Pertinent Vitals/Pain Pain Assessment: No/denies pain Pain Intervention(s): Premedicated before session;Monitored during session    Home Living                      Prior Function            PT Goals (current goals can now be found in the care plan section) Acute Rehab PT Goals Patient Stated Goal: To be able to return home, watch sports on tv, grill wings PT Goal Formulation: With patient Time For Goal Achievement: 09/05/14 Potential to Achieve Goals: Good Progress towards PT goals: Goals met/education completed, patient discharged from PT    Frequency  Min 3X/week    PT Plan Current plan remains appropriate    Co-evaluation             End of Session Equipment Utilized During Treatment: Gait belt Activity Tolerance: Patient tolerated treatment well Patient left:  with call bell/phone within reach;in bed     Time: 1222-4114 PT Time Calculation (min) (ACUTE ONLY): 28 min  Charges:  $Gait Training: 23-37 mins                    G Codes:      Philomena Doheny 09/02/2014, 9:23 AM

## 2014-09-02 NOTE — Anesthesia Postprocedure Evaluation (Signed)
Anesthesia Post Note  Patient: Edward Hopkins  Procedure(s) Performed: Procedure(s) (LRB): EXTRACTION OF TOOTH #4 WITH  ALVEOLOPLASTY (N/A)  Anesthesia type: MAC  Patient location: PACU  Post pain: Pain level controlled  Post assessment: Patient's Cardiovascular Status Stable  Last Vitals:  Filed Vitals:   09/02/14 1145  BP: 129/97  Pulse: 79  Temp: 36.8 C  Resp: 19    Post vital signs: Reviewed and stable  Level of consciousness: sedated  Complications: No apparent anesthesia complications

## 2014-09-03 MED ORDER — METOPROLOL TARTRATE 25 MG PO TABS: 25.0000 mg | ORAL_TABLET | Freq: Two times a day (BID) | ORAL | Status: DC

## 2014-09-03 MED ORDER — ASPIRIN 325 MG PO TBEC: 325.0000 mg | DELAYED_RELEASE_TABLET | Freq: Every day | ORAL | Status: DC

## 2014-09-03 MED ORDER — AMLODIPINE BESYLATE 10 MG PO TABS: 10.0000 mg | ORAL_TABLET | Freq: Every day | ORAL | Status: DC

## 2014-09-03 MED ORDER — AMOXICILLIN 500 MG PO CAPS: 500.0000 mg | ORAL_CAPSULE | Freq: Three times a day (TID) | ORAL | Status: DC

## 2014-09-03 MED ORDER — OXYCODONE HCL 5 MG PO TABS: 5.0000 mg | ORAL_TABLET | ORAL | Status: DC | PRN

## 2014-09-03 NOTE — Progress Notes (Signed)
      [address]       [place] [address]             [phone]      1 Day Post-Op Procedure(s) (LRB): EXTRACTION OF TOOTH #4 WITH  ALVEOLOPLASTY (N/A)   Subjective:  Mr. Tim Lair has no complaints this morning.  He is ready to be discharged home today.  He does question assistance with his medications, which he states he received last time.  However, I all discharge medications are available on the $4 list at [facility], except for his narcotic pain medication  Objective: Vital signs in last 24 hours: Temp:  [98.2 F (36.8 C)-99.7 F (37.6 C)] 98.3 F (36.8 C) (04/21 0452) Pulse Rate:  [73-114] 81 (04/21 0452) Cardiac Rhythm:  [-] Normal sinus rhythm;Bundle branch block (04/21 0738) Resp:  [18-20] 20 (04/21 0452) BP: (117-140)/(82-100) 139/95 mmHg (04/21 0452) SpO2:  [92 %-99 %] 97 % (04/21 0452) Weight:  [204 lb 6.4 oz (92.715 kg)] 204 lb 6.4 oz (92.715 kg) (04/21 0452)  Intake/Output from previous day: 04/20 0701 - 04/21 0700 In: 760 [P.O.:360; I.V.:400] Out: 1450 [Urine:1440; Blood:10]  General appearance: alert, cooperative and no distress Heart: regular rate and rhythm Lungs: clear to auscultation bilaterally Abdomen: soft, non-tender; bowel sounds normal; no masses,  no organomegaly Extremities: edema trace Wound: clean and dry  Lab Results: No results for input(s): WBC, HGB, HCT, PLT in the last 72 hours. BMET: No results for input(s): NA, K, CL, CO2, GLUCOSE, BUN, CREATININE, CALCIUM in the last 72 hours.  PT/INR: No results for input(s): LABPROT, INR in the last 72 hours. ABG    Component Value Date/Time   PHART 7.379 08/28/2014 0209   HCO3 24.3* 08/28/2014 0209   TCO2 19 08/28/2014 1657   ACIDBASEDEF 1.0 08/28/2014 0209   O2SAT 97.0 08/28/2014 0209   CBG (last 3)  No results for input(s): GLUCAP in the last 72 hours.  Assessment/Plan: S/P Procedure(s) (LRB): EXTRACTION OF TOOTH #4 WITH  ALVEOLOPLASTY (N/A)  1. CV- NSR, blood  pressure slowly creeping up will continue Lopressor and increase Norvasc 2. Pulm- no acute issues, continue IS 3. Renal- volume status is at baseline, will continue to hold diuretic 4. Dental Abscess- tooth extracted yesterday without difficulty, will give a few more days of Amoxicillin 5. Dispo- patient stable, will d/c home today   LOS: 7 days    Ahmed Prima, Hiyab Nhem 09/03/2014

## 2014-09-03 NOTE — Progress Notes (Signed)
Medicare Important Message given? YES  (If response is "NO", the following Medicare IM given date fields will be blank)  Date Medicare IM given: 09/03/14 Medicare IM given by:  Jenika Chiem  

## 2014-09-03 NOTE — Progress Notes (Signed)
CARDIAC REHAB PHASE I   [facility] education completed. Encouraged IS, reviewed sternal precautions, exercise guidelines, tobacco cessation, heart healthy diet and sodium restrictions. Pt verbalized understanding. Pt states he "will try to quit smoking" but does not promise that he won't smoke.  Pt states he "binge eats", especially sweets. Reinforced importance of diet compliance, refraining from tobacco, drugs and alcohol.   Pt verbalized understanding. Gave pt [facility] discharge video information. Discussed PH2, pt has medicare, unsure of coverage, pt states he can't afford out of pocket ocst for PH2.   1025-1132  Lenna Sciara, RN, BSN 09/03/2014 11:27 AM

## 2014-09-03 NOTE — Care Management Note (Signed)
    Page 1 of 2   09/03/2014     12:09:50 PM CARE MANAGEMENT NOTE 09/03/2014  Patient:  TREJON, CODDING   Account Number:  [id number]  Date Initiated:  08/31/2014  Documentation initiated by:  [facility]  Subjective/Objective Assessment:     Action/Plan:   lives at home with girlfriend, Marrian Salvage   Anticipated DC Date:  09/03/2014   Anticipated DC Plan:  [place]  CM consult      Choice offered to / List presented to:     DME arranged  Vassie Moselle      DME agency  [place].        Status of service:  Completed, signed off Medicare Important Message given?  YES (If response is "NO", the following Medicare IM given date fields will be blank) Date Medicare IM given:  08/31/2014 Medicare IM given by:  [facility] Date Additional Medicare IM given:  09/03/2014 Additional Medicare IM given by:  Marvetta Gibbons  Discharge Disposition:  HOME/SELF CARE  Per UR Regulation:  Reviewed for med. necessity/level of care/duration of stay  If discussed at [place] of Stay Meetings, dates discussed:   09/01/2014  09/03/2014    Comments:  Contact:  Patterson,[name] Significant other [phone]  [phone]  09/03/14- 1100- Marvetta Gibbons RN, BSN [phone] Pt for d/c home today, needs RW, spoke with Brenton Grills- with [facility]- RW to be delivered to room prior to discharge- also spoke with pt regarding medications- pt was assisted with medications through [facility] on his last admission- is not eligible for [facility] again as it is once in a 12 mo rolling period- went over pt's meds- other than pain meds all are generic meds that pt states he will be able to afford- discussed with pt about speaking with his PCP clinic about medication assistance or MAPS program with his local Health Department- pt to f/u with his PCP clinic to see if there is any help there if needed.  09/02/14- 1200- Marvetta Gibbons RN [phone] Pt with tooth  abscess for extraction today and plan for d/c in am.   08/31/2014 1730 NCM spoke to pt and states he does not need any DME at this time. Wants to wait closer to his dc date to see if he will need HH PT. Jonnie Finner RN CCM Case Mgmt phone [phone]

## 2014-09-03 NOTE — Discharge Instructions (Signed)
Ascending Aortic Replacement, Care After Refer to this sheet in the next few weeks. These instructions provide you with information on caring for yourself after your procedure. Your health care provider may also give you specific instructions. Your treatment has been planned according to current medical practices, but problems sometimes occur. Call your health care provider if you have any problems or questions after your procedure. HOME CARE INSTRUCTIONS   Take medicines only as directed by your health care provider.  If your health care provider has prescribed elastic stockings, wear them as directed.  Take frequent naps or rest often throughout the day.  Avoid lifting over 10 lbs (4.5 kg) or pushing or pulling things with your arms for 6-8 weeks or as directed by your health care provider.  Avoid driving or airplane travel for 4-6 weeks after surgery or as directed by your health care provider. If you are riding in a car for an extended period, stop every 1-2 hours to stretch your legs. Keep a record of your medicines and medical history with you when traveling.  Do not drive or operate heavy machinery while taking pain medicine. (narcotics).  Do not cross your legs.  Do not use any tobacco products including cigarettes, chewing tobacco, or electronic cigarettes. If you need help quitting, ask your health care provider.  Do not take baths, swim, or use a hot tub until your health care provider approves. Take showers once your health care provider approves. Pat incisions dry. Do not rub incisions with a washcloth or towel.  Avoid climbing stairs and using the handrail to pull yourself up for the first 2-3 weeks after surgery.  Return to work as directed by your health care provider.  Drink enough fluid to keep your urine clear or pale yellow.  Do not strain to have a bowel movement. Eat high-fiber foods if you become constipated. You may also take a medicine to help you have a bowel  movement (laxative) as directed by your health care provider.  Resume sexual activity as directed by your health care provider. Men should not use medicines for erectile dysfunction until their doctor says it isokay.  If you had a certain type of heart condition in the past, you may need to take antibiotic medicine before having dental work or surgery. Let your dentist and health care providers know if you had one or more of the following:  Previous endocarditis.  An artificial (prosthetic) heart valve.  Congenital heart disease. SEEK MEDICAL CARE IF:  You develop a skin rash.   You experience sudden changes in your weight.  You have a fever. SEEK IMMEDIATE MEDICAL CARE IF:   You develop chest pain that is not coming from your incision.  You have drainage (pus), redness, swelling, or pain at your incision site.   You develop shortness of breath or have difficulty breathing.   You have increased bleeding from your incision site.   You develop light-headedness.  MAKE SURE YOU:   Understand these directions.  Will watch your condition.  Will get help right away if you are not doing well or get worse. Document Released: 11/17/2004 Document Revised: 09/15/2013 Document Reviewed: 02/13/2012 [facility] Patient Information 2015 [place], [place]. This information is not intended to replace advice given to you by your health care provider. Make sure you discuss any questions you have with your health care provider.   Blood Pressure Control- Please keep SBP <130

## 2014-09-04 ENCOUNTER — Encounter (HOSPITAL_COMMUNITY): Payer: Self-pay | Admitting: Dentistry

## 2014-09-10 ENCOUNTER — Other Ambulatory Visit: Payer: Self-pay

## 2014-09-10 ENCOUNTER — Ambulatory Visit (HOSPITAL_COMMUNITY): Payer: Medicare Other | Admitting: Dentistry

## 2014-09-10 ENCOUNTER — Encounter (HOSPITAL_COMMUNITY): Payer: Self-pay | Admitting: Dentistry

## 2014-09-10 DIAGNOSIS — G8918 Other acute postprocedural pain: Secondary | ICD-10-CM

## 2014-09-10 MED ORDER — OXYCODONE HCL 5 MG PO TABS: 5.0000 mg | ORAL_TABLET | ORAL | Status: DC | PRN

## 2014-09-10 NOTE — Telephone Encounter (Signed)
RX for Oxycodone 5 mg refilled, Patient will pick up at front desk.

## 2014-09-10 NOTE — Patient Instructions (Signed)
[place]    Department of Dental Medicine     DR. Aaralynn Shepheard      HEART VALVES AND MOUTH CARE:  FACTS:   If you have any infection in your mouth, it can infect your heart valve.  If you heart valve is infected, you will be seriously ill.  Infections in the mouth can be SILENT and do not always cause pain.  Examples of infections in the mouth are gum disease, dental cavities, and abscesses.  Some possible signs of infection are: Bad breath, bleeding gums, or teeth that are sensitive to sweets, hot, and/or cold. There are many other signs as well.  WHAT YOU HAVE TO DO:   Brush your teeth after meals and at bedtime. Spend at least 2 minutes brushing well, especially behind your back teeth and all around your teeth that stand alone. Brush at the gumline also.  Do not go to bed without brushing your teeth and flossing.  If you gums bleed when you brush or floss, do NOT stop brushing or flossing. It usually means that your gums need more attention and better cleaning.   If your Dentist or Dr. Raniya Golembeski gave you a prescription mouthwash to use, make sure to use it as directed. If you run out of the medication, get a refill at the pharmacy.   If you were given any other medications or directions by your Dentist, please follow them. If you did not understand the directions or forget what you were told, please call. We will be happy to refresh her memory.  If you need antibiotics before dental procedures, make sure you take them one hour prior to every dental visit as directed.   Get a dental checkup every 4-6 months in order to keep your mouth healthy, or to find and treat any new infection. You will most likely need your teeth cleaned or gums treated at the same time.  If you are not able to come in for your scheduled appointment, call your Dentist as soon as possible to reschedule.  If you have a problem in between dental visits, call your Dentist.  

## 2014-09-14 DIAGNOSIS — M009 Pyogenic arthritis, unspecified: Secondary | ICD-10-CM | POA: Diagnosis not present

## 2014-09-14 DIAGNOSIS — D649 Anemia, unspecified: Secondary | ICD-10-CM | POA: Diagnosis not present

## 2014-09-14 DIAGNOSIS — N179 Acute kidney failure, unspecified: Secondary | ICD-10-CM | POA: Diagnosis not present

## 2014-09-14 DIAGNOSIS — I7100 Dissection of unspecified site of aorta: Secondary | ICD-10-CM | POA: Diagnosis not present

## 2014-09-14 DIAGNOSIS — M4806 Spinal stenosis, lumbar region: Secondary | ICD-10-CM | POA: Diagnosis not present

## 2014-09-15 NOTE — Progress Notes (Signed)
No show. Broken appointment.

## 2014-09-28 DIAGNOSIS — M1612 Unilateral primary osteoarthritis, left hip: Secondary | ICD-10-CM | POA: Diagnosis not present

## 2014-09-28 DIAGNOSIS — M1611 Unilateral primary osteoarthritis, right hip: Secondary | ICD-10-CM | POA: Diagnosis not present

## 2014-09-29 ENCOUNTER — Other Ambulatory Visit: Payer: Self-pay | Admitting: Surgery

## 2014-09-29 DIAGNOSIS — Z95828 Presence of other vascular implants and grafts: Secondary | ICD-10-CM

## 2014-09-30 ENCOUNTER — Other Ambulatory Visit: Payer: Self-pay | Admitting: *Deleted

## 2014-09-30 ENCOUNTER — Ambulatory Visit (INDEPENDENT_AMBULATORY_CARE_PROVIDER_SITE_OTHER): Payer: Self-pay | Admitting: Surgery

## 2014-09-30 ENCOUNTER — Ambulatory Visit
Admission: RE | Admit: 2014-09-30 | Discharge: 2014-09-30 | Disposition: A | Discharge status: Home / Self Care | Payer: Medicare Other | Source: Ambulatory Visit | Attending: Surgery | Admitting: Surgery

## 2014-09-30 ENCOUNTER — Encounter: Payer: Self-pay | Admitting: Surgery

## 2014-09-30 VITALS — BP 97/66 | HR 71 | Resp 16 | Ht 69.0 in | Wt 203.0 lb

## 2014-09-30 DIAGNOSIS — Z95828 Presence of other vascular implants and grafts: Secondary | ICD-10-CM

## 2014-09-30 DIAGNOSIS — G8918 Other acute postprocedural pain: Secondary | ICD-10-CM

## 2014-09-30 DIAGNOSIS — J90 Pleural effusion, not elsewhere classified: Secondary | ICD-10-CM | POA: Diagnosis not present

## 2014-09-30 DIAGNOSIS — J9811 Atelectasis: Secondary | ICD-10-CM | POA: Diagnosis not present

## 2014-09-30 DIAGNOSIS — I719 Aortic aneurysm of unspecified site, without rupture: Secondary | ICD-10-CM

## 2014-09-30 MED ORDER — OXYCODONE HCL 5 MG PO TABS: 5.0000 mg | ORAL_TABLET | ORAL | Status: DC | PRN

## 2014-09-30 NOTE — Progress Notes (Signed)
       HPI: Patient returns for routine postoperative follow-up having undergone replacement of the ascending aorta and aortic arch with aorto-innominate and aorto-left carotid bypass on 08/27/2014. The patient's early postoperative recovery while in the hospital was notable for an uncomplicated postop course. Since hospital discharge the patient reports that he has been progressing well. 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 He says that he is scheduled for a cortisone injection in his hip soon. He has mild incisional discomfort and is still using oxycodone sparingly. I refilled his prescription today.   Current Outpatient Prescriptions  Medication Sig Dispense Refill  . amLODipine (NORVASC) 10 MG tablet Take 1 tablet (10 mg total) by mouth daily. 30 tablet 3  . aspirin EC 325 MG EC tablet Take 1 tablet (325 mg total) by mouth daily. 30 tablet 0  . metoprolol tartrate (LOPRESSOR) 25 MG tablet Take 1 tablet (25 mg total) by mouth 2 (two) times daily. 60 tablet 3  . oxyCODONE (OXY IR/ROXICODONE) 5 MG immediate release tablet Take 1-2 tablets (5-10 mg total) by mouth every 3 (three) hours as needed for severe pain. 40 tablet 0   No current facility-administered medications for this visit.    Physical Exam: BP 97/66 mmHg  Pulse 71  Resp 16  Ht 5\' 9"  (1.753 m)  Wt 203 lb (92.08 kg)  BMI 29.96 kg/m2  SpO2 98% He looks well. Lung exam is clear. Cardiac exam shows a regular rate and rhythm with normal heart sounds. There is no murmur Chest incisions are healing well and sternum is stable. There is mild edema in the right ankle.   Diagnostic Tests:  CLINICAL DATA: Chest pain.  EXAM: CHEST 2 VIEW  COMPARISON: 08/30/2014.  FINDINGS: Mediastinum hilar structures are normal. Prior median sternotomy. Heart size stable. Surgical clips in the upper chest. Interval partial clearing of left base atelectasis.  Small left pleural effusion. No pneumothorax.  IMPRESSION: 1. Postsurgical changes with prior median sternotomy. Heart size stable. 2. Interim partial clearing of left base atelectasis and left pleural effusion.   Electronically Signed  By: Marcello Moores Register  On: 09/30/2014 11:06   Impression:  Overall I think he is doing well. I encouraged him to continue walking.  I told him he could drive his car but should not lift anything heavier than 10 lbs for three months postop. He was found to have a LLL pulmonary embolus on his CTA of the chest on 07/21/2014 when he initially presented with the aortic hematoma and dissection. Venous duplex showed a left femoral DVT. He could not be anticoagulated so a temporary IVC filter was placed. At this point I think he could be anticoagulated but he is somewhat non-compliant and it may be best to leave the IVC filter in for now and avoid anticoagulation. I will see him back in 3 months and decided when the IVC filter can be removed. He is in agreement with that. I will plan to do a CTA of the chest in 6 months to reevaluate the remaining aorta to be sure he does not develop any new aneurysms.   Plan:  Return for follow up in 3 months   [name], MD [facility] [phone]

## 2014-10-27 DIAGNOSIS — M1612 Unilateral primary osteoarthritis, left hip: Secondary | ICD-10-CM | POA: Diagnosis not present

## 2014-11-09 DIAGNOSIS — D649 Anemia, unspecified: Secondary | ICD-10-CM | POA: Diagnosis not present

## 2014-11-09 DIAGNOSIS — R208 Other disturbances of skin sensation: Secondary | ICD-10-CM | POA: Diagnosis not present

## 2014-11-09 DIAGNOSIS — Z125 Encounter for screening for malignant neoplasm of prostate: Secondary | ICD-10-CM | POA: Diagnosis not present

## 2014-11-09 DIAGNOSIS — N529 Male erectile dysfunction, unspecified: Secondary | ICD-10-CM | POA: Diagnosis not present

## 2014-11-09 DIAGNOSIS — I10 Essential (primary) hypertension: Secondary | ICD-10-CM | POA: Diagnosis not present

## 2014-11-09 DIAGNOSIS — M009 Pyogenic arthritis, unspecified: Secondary | ICD-10-CM | POA: Diagnosis not present

## 2014-11-09 DIAGNOSIS — Z6830 Body mass index (BMI) 30.0-30.9, adult: Secondary | ICD-10-CM | POA: Diagnosis not present

## 2014-11-09 DIAGNOSIS — Z1389 Encounter for screening for other disorder: Secondary | ICD-10-CM | POA: Diagnosis not present

## 2015-01-06 ENCOUNTER — Encounter: Payer: Self-pay | Admitting: Surgery

## 2015-01-06 ENCOUNTER — Ambulatory Visit (INDEPENDENT_AMBULATORY_CARE_PROVIDER_SITE_OTHER): Payer: Medicare Other | Admitting: Surgery

## 2015-01-06 VITALS — BP 112/76 | HR 60 | Resp 20 | Ht 69.0 in | Wt 203.0 lb

## 2015-01-06 DIAGNOSIS — I719 Aortic aneurysm of unspecified site, without rupture: Secondary | ICD-10-CM | POA: Diagnosis not present

## 2015-01-06 NOTE — Progress Notes (Signed)
     HPI:  Patient returns for follow-up having undergone replacement of the ascending aorta and aortic arch with aorto-innominate and aorto-left carotid bypass on 08/27/2014 for an enlarging aortic aneurysm with dissection. He continues to feel well. His only real complaint is bilateral hip pain. 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 He takes Ibuprofen for that.  Current Outpatient Prescriptions  Medication Sig Dispense Refill  . amLODipine (NORVASC) 10 MG tablet Take 1 tablet (10 mg total) by mouth daily. 30 tablet 3  . aspirin EC 325 MG EC tablet Take 1 tablet (325 mg total) by mouth daily. 30 tablet 0  . metoprolol tartrate (LOPRESSOR) 25 MG tablet Take 1 tablet (25 mg total) by mouth 2 (two) times daily. 60 tablet 3   No current facility-administered medications for this visit.     Physical Exam: BP 112/76 mmHg  Pulse 60  Resp 20  Ht 5\' 9"  (1.753 m)  Wt 203 lb (92.08 kg)  BMI 29.96 kg/m2  SpO2 98% He looks well. Lung exam is clear. Cardiac exam shows a regular rate and rhythm with normal heart sounds. There is no murmur Chest incisions are healing well and sternum is stable. There is no lower extremity edema  Diagnostic Tests:  None today  Impression:  Overall I think he is doing well. He was found to have a LLL pulmonary embolus on his CTA of the chest on 07/21/2014 when he initially presented with the aortic hematoma and dissection. Venous duplex showed a left femoral DVT. He could not be anticoagulated so a temporary IVC filter was placed. I did not elect to start him on coumadin later after surgery since he tends to be non-compliant. I will plan to do a CTA of the chest in 3 months to reevaluate the remaining thoracic aorta and will get a venous duplex of his lower extremities to reassess the left femoral DVT and decide if the IVC filter can be removed.  Plan:  Follow up in 3 months with CTA chest  and venous duplex of lower extremities.   [name], MD [facility] [phone]

## 2015-03-05 ENCOUNTER — Other Ambulatory Visit: Payer: Self-pay | Admitting: *Deleted

## 2015-03-05 DIAGNOSIS — I82412 Acute embolism and thrombosis of left femoral vein: Secondary | ICD-10-CM

## 2015-03-05 DIAGNOSIS — Z95828 Presence of other vascular implants and grafts: Secondary | ICD-10-CM

## 2015-03-22 ENCOUNTER — Ambulatory Visit (HOSPITAL_COMMUNITY): Admission: RE | Admit: 2015-03-22 | Payer: Medicare Other | Source: Ambulatory Visit

## 2015-04-06 ENCOUNTER — Ambulatory Visit (HOSPITAL_COMMUNITY): Admission: RE | Admit: 2015-04-06 | Payer: Medicare Other | Source: Ambulatory Visit

## 2015-04-07 ENCOUNTER — Inpatient Hospital Stay: Admission: RE | Admit: 2015-04-07 | Payer: Self-pay | Source: Ambulatory Visit

## 2015-04-07 ENCOUNTER — Encounter: Payer: Medicare Other | Admitting: Surgery

## 2015-04-12 ENCOUNTER — Telehealth: Payer: Self-pay | Admitting: *Deleted

## 2015-04-17 IMAGING — CR DG CHEST 1V PORT
1 series · 1 of 1 positions shown · non-contrast
Comparison: 08/28/2014

CLINICAL DATA: Thoracic aneurysm

EXAM:
PORTABLE CHEST - 1 VIEW

[AP]
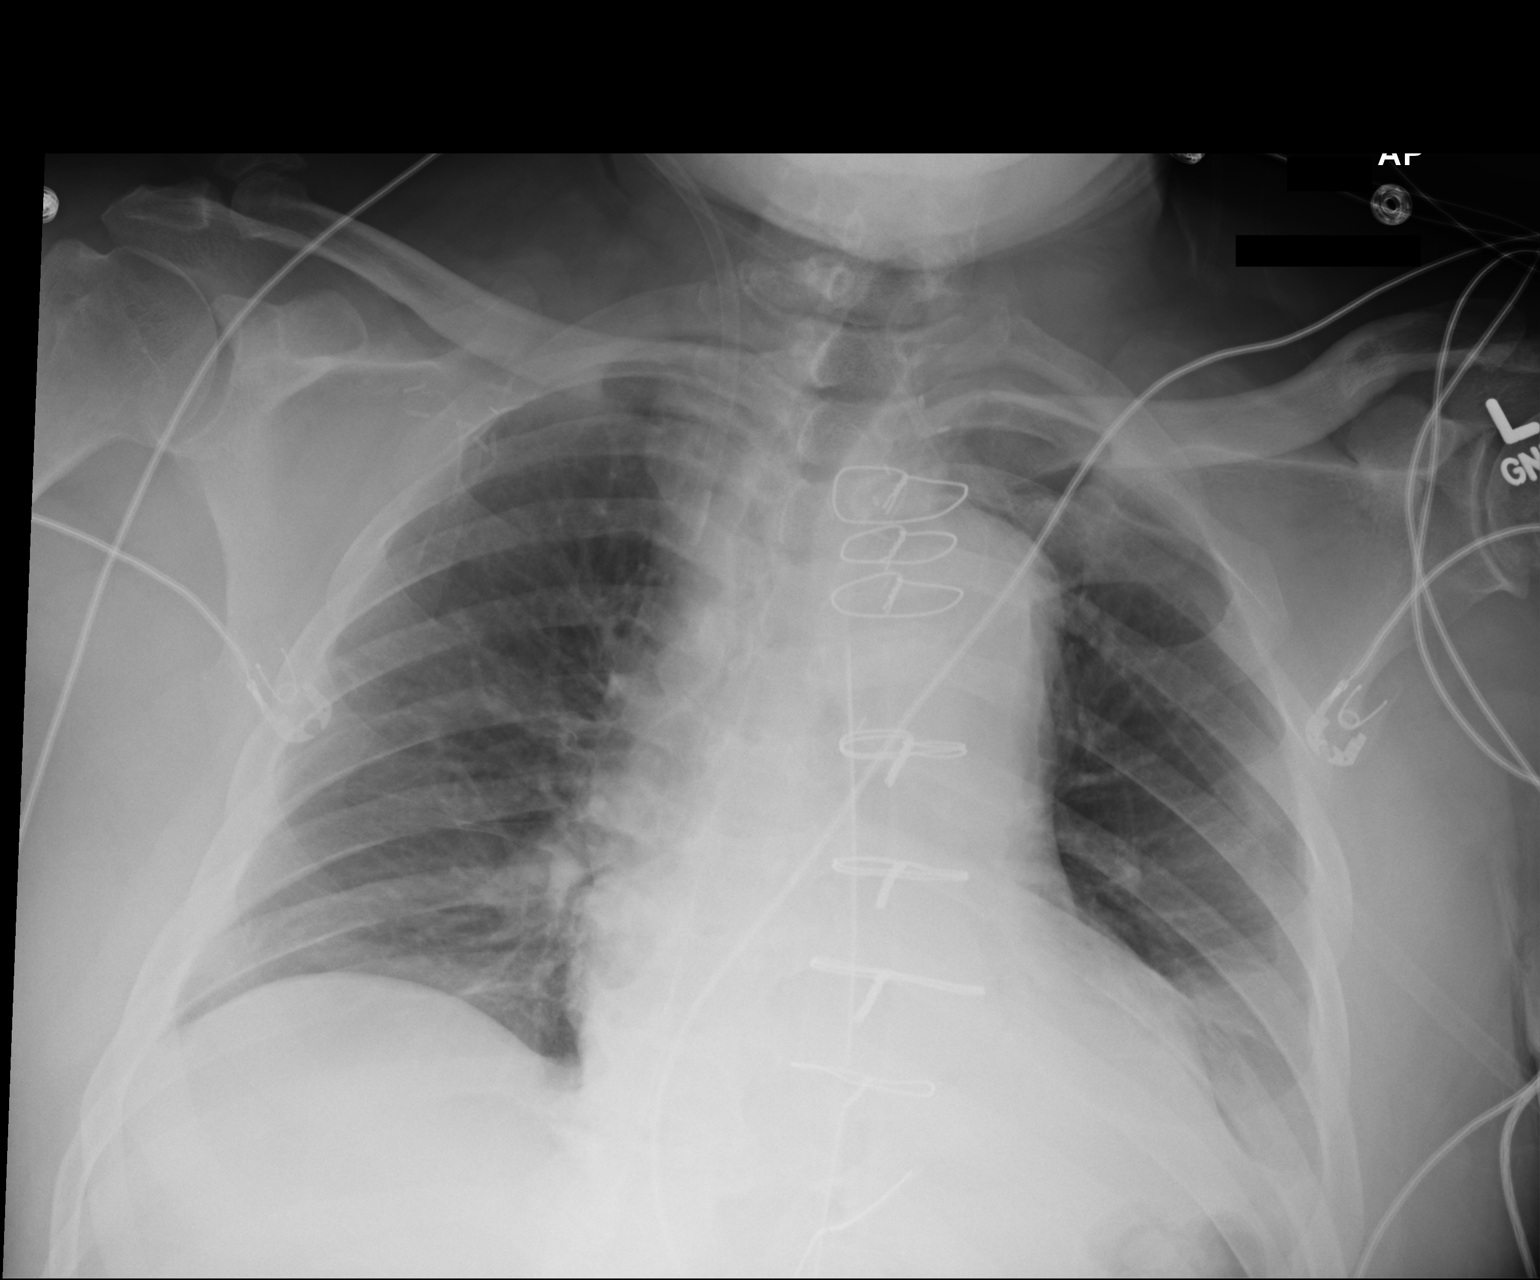

[1 of 1 positions shown; findings below may reference images not displayed]

FINDINGS: Postsurgical changes are again noted. A mediastinal drain is seen. A
right jugular sheath is again noted. The lungs are well aerated with
minimal basilar atelectatic changes stable from the prior study. No
other focal infiltrate is seen.
IMPRESSION: Postsurgical changes with basilar atelectasis.

## 2015-04-18 IMAGING — CR DG CHEST 1V PORT
1 series · 1 of 1 positions shown · non-contrast
Comparison: 08/29/2014

CLINICAL DATA: Aneurysm of the ascending aorta

EXAM:
PORTABLE CHEST - 1 VIEW

[AP]
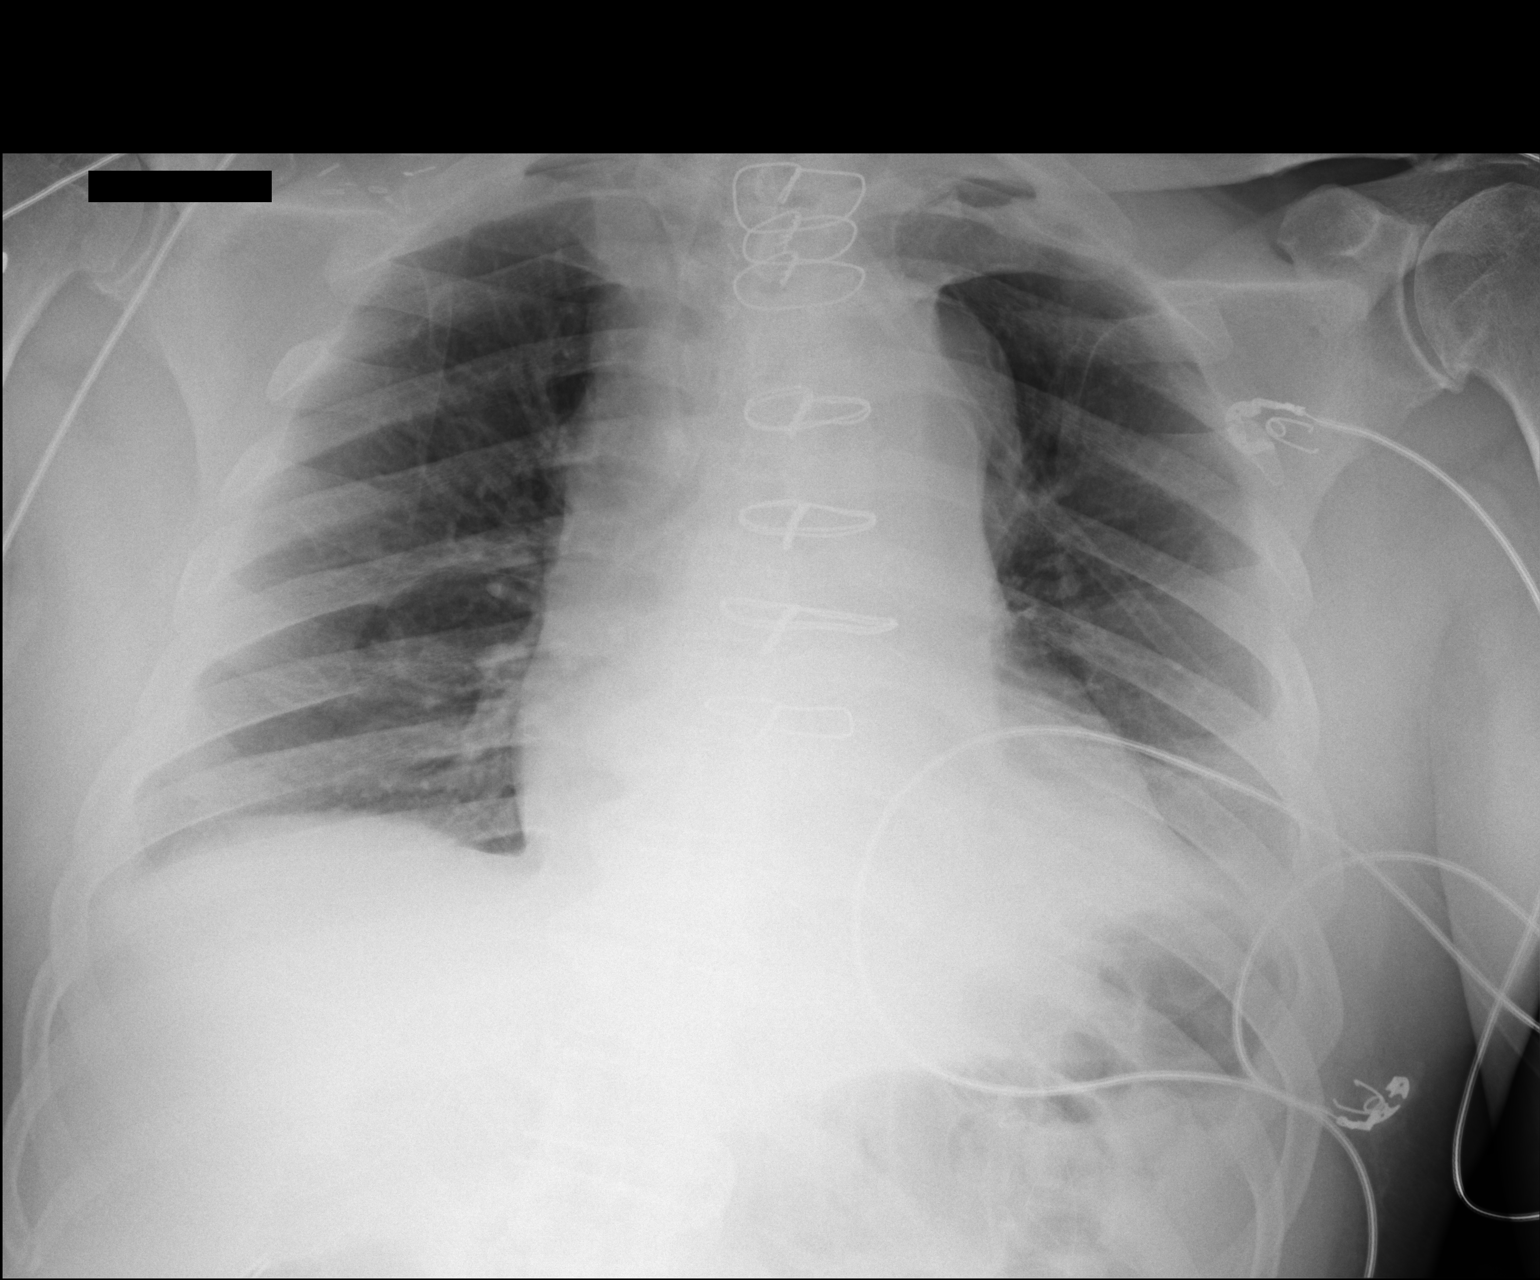

[1 of 1 positions shown; findings below may reference images not displayed]

FINDINGS: Interval removal of the thoracic drain and right IJ sheath.

Stable mild cardiomegaly. Stable dilated aortic silhouette. There is
persistent obscuration of the left diaphragm consistent with
atelectasis and possible small pleural effusion. No pneumothorax. No
pulmonary edema.
IMPRESSION: 1. Stable appearance of the chest after thoracic drain and sheath
removal.
2. Unchanged retrocardiac atelectasis.

## 2015-04-19 IMAGING — CR DG ORTHOPANTOGRAM /PANORAMIC
2 series · 2 of 2 positions shown · non-contrast
Comparison: None

CLINICAL DATA: Loose RIGHT upper tooth

EXAM:
ORTHOPANTOGRAM/PANORAMIC

[view not recorded (1 of 2)]
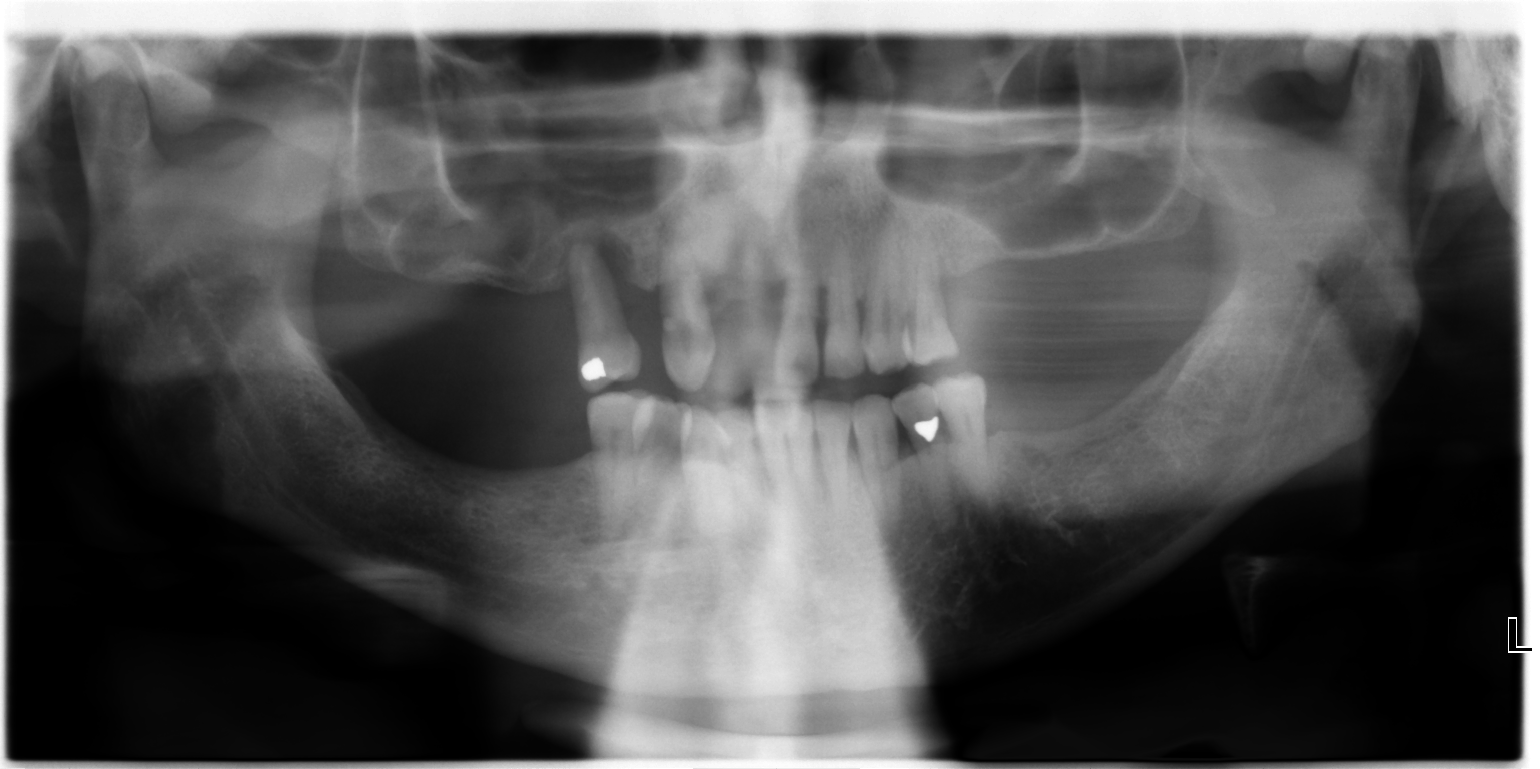

[view not recorded (2 of 2)]
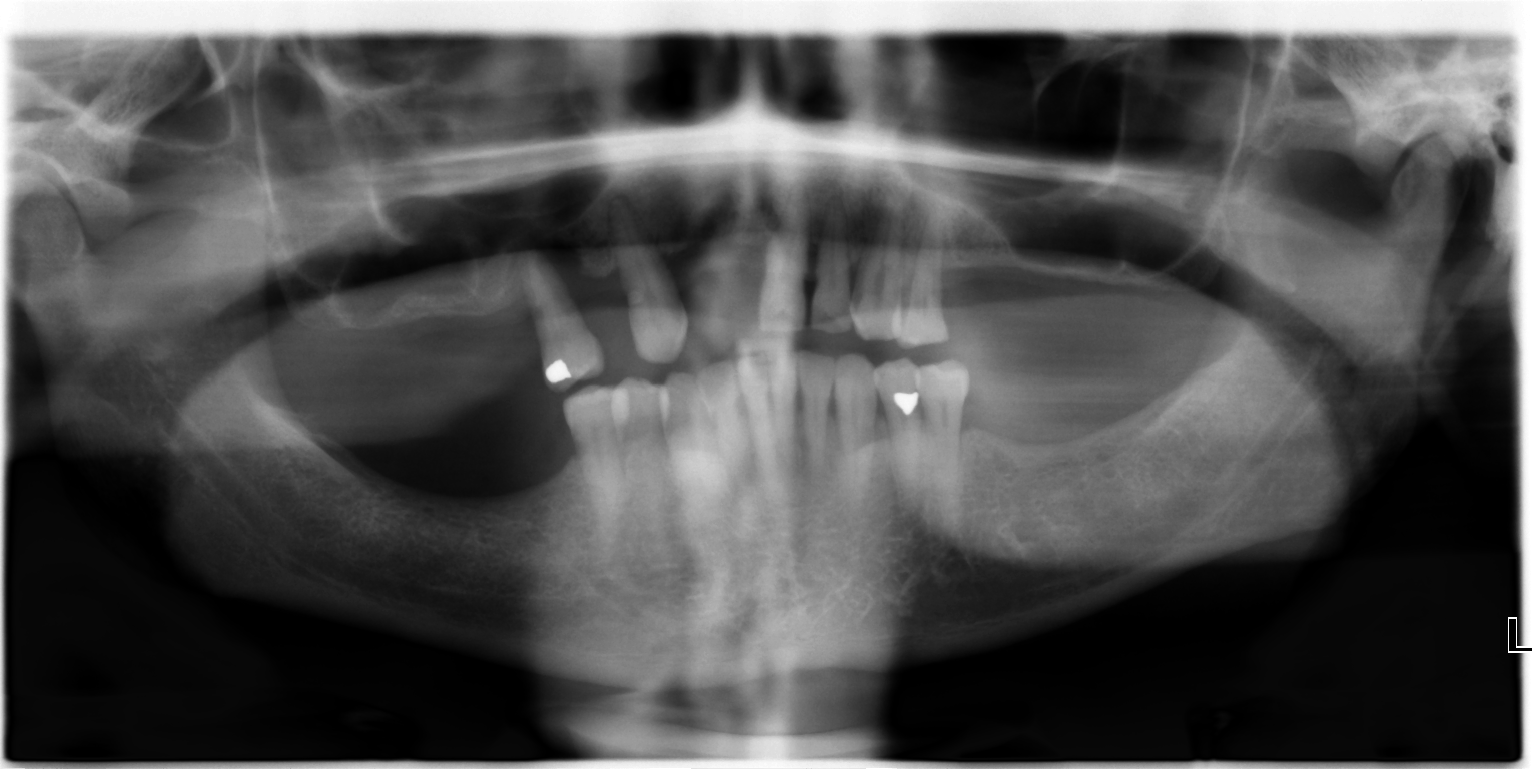

[2 of 2 positions shown; findings below may reference images not displayed]

FINDINGS: Bones appear demineralized.

Multiple prior dental extractions and absent teeth.

Large area of periodontal lucency identified at LEFT maxilla at
approximately tooth #4 question periapical abscess.

Minimal periodontal lucency at tooth #6.

No additional bone destruction identified.
IMPRESSION: Large periapical abscess LEFT maxilla at approximately tooth #4.

## 2015-04-20 ENCOUNTER — Telehealth: Payer: Self-pay | Admitting: *Deleted

## 2015-04-21 NOTE — Telephone Encounter (Signed)
Mr. Horng has no showed for two OV appointments and testing, and he has not returned three phone calls regarding setting up new appointment times for testing/office visit.  Per Dr. Cyndia Bent, we will leave as is and if the patient does call Dr. Cyndia Bent will see him.

## 2015-05-19 ENCOUNTER — Ambulatory Visit
Admission: RE | Admit: 2015-05-19 | Discharge: 2015-05-19 | Disposition: A | Discharge status: Home / Self Care | Payer: Medicare Other | Source: Ambulatory Visit | Attending: Surgery | Admitting: Surgery

## 2015-05-19 ENCOUNTER — Ambulatory Visit (INDEPENDENT_AMBULATORY_CARE_PROVIDER_SITE_OTHER): Payer: Medicare Other | Admitting: Surgery

## 2015-05-19 ENCOUNTER — Encounter: Payer: Self-pay | Admitting: Surgery

## 2015-05-19 ENCOUNTER — Ambulatory Visit (HOSPITAL_COMMUNITY)
Admission: RE | Admit: 2015-05-19 | Discharge: 2015-05-19 | Disposition: A | Discharge status: Home / Self Care | Payer: Medicare Other | Source: Ambulatory Visit | Attending: Surgery | Admitting: Surgery

## 2015-05-19 VITALS — BP 140/101 | HR 70 | Resp 16 | Ht 69.0 in | Wt 210.0 lb

## 2015-05-19 DIAGNOSIS — Z95828 Presence of other vascular implants and grafts: Secondary | ICD-10-CM

## 2015-05-19 DIAGNOSIS — I82412 Acute embolism and thrombosis of left femoral vein: Secondary | ICD-10-CM | POA: Diagnosis not present

## 2015-05-19 DIAGNOSIS — I7100 Dissection of unspecified site of aorta: Secondary | ICD-10-CM

## 2015-05-19 DIAGNOSIS — I712 Thoracic aortic aneurysm, without rupture: Secondary | ICD-10-CM | POA: Diagnosis not present

## 2015-05-19 DIAGNOSIS — Z09 Encounter for follow-up examination after completed treatment for conditions other than malignant neoplasm: Secondary | ICD-10-CM | POA: Diagnosis not present

## 2015-05-19 IMAGING — CR DG CHEST 2V
2 series · 2 of 2 positions shown · non-contrast
Comparison: 08/30/2014.

CLINICAL DATA: Chest pain.

EXAM:
CHEST  2 VIEW

[w chest pa]
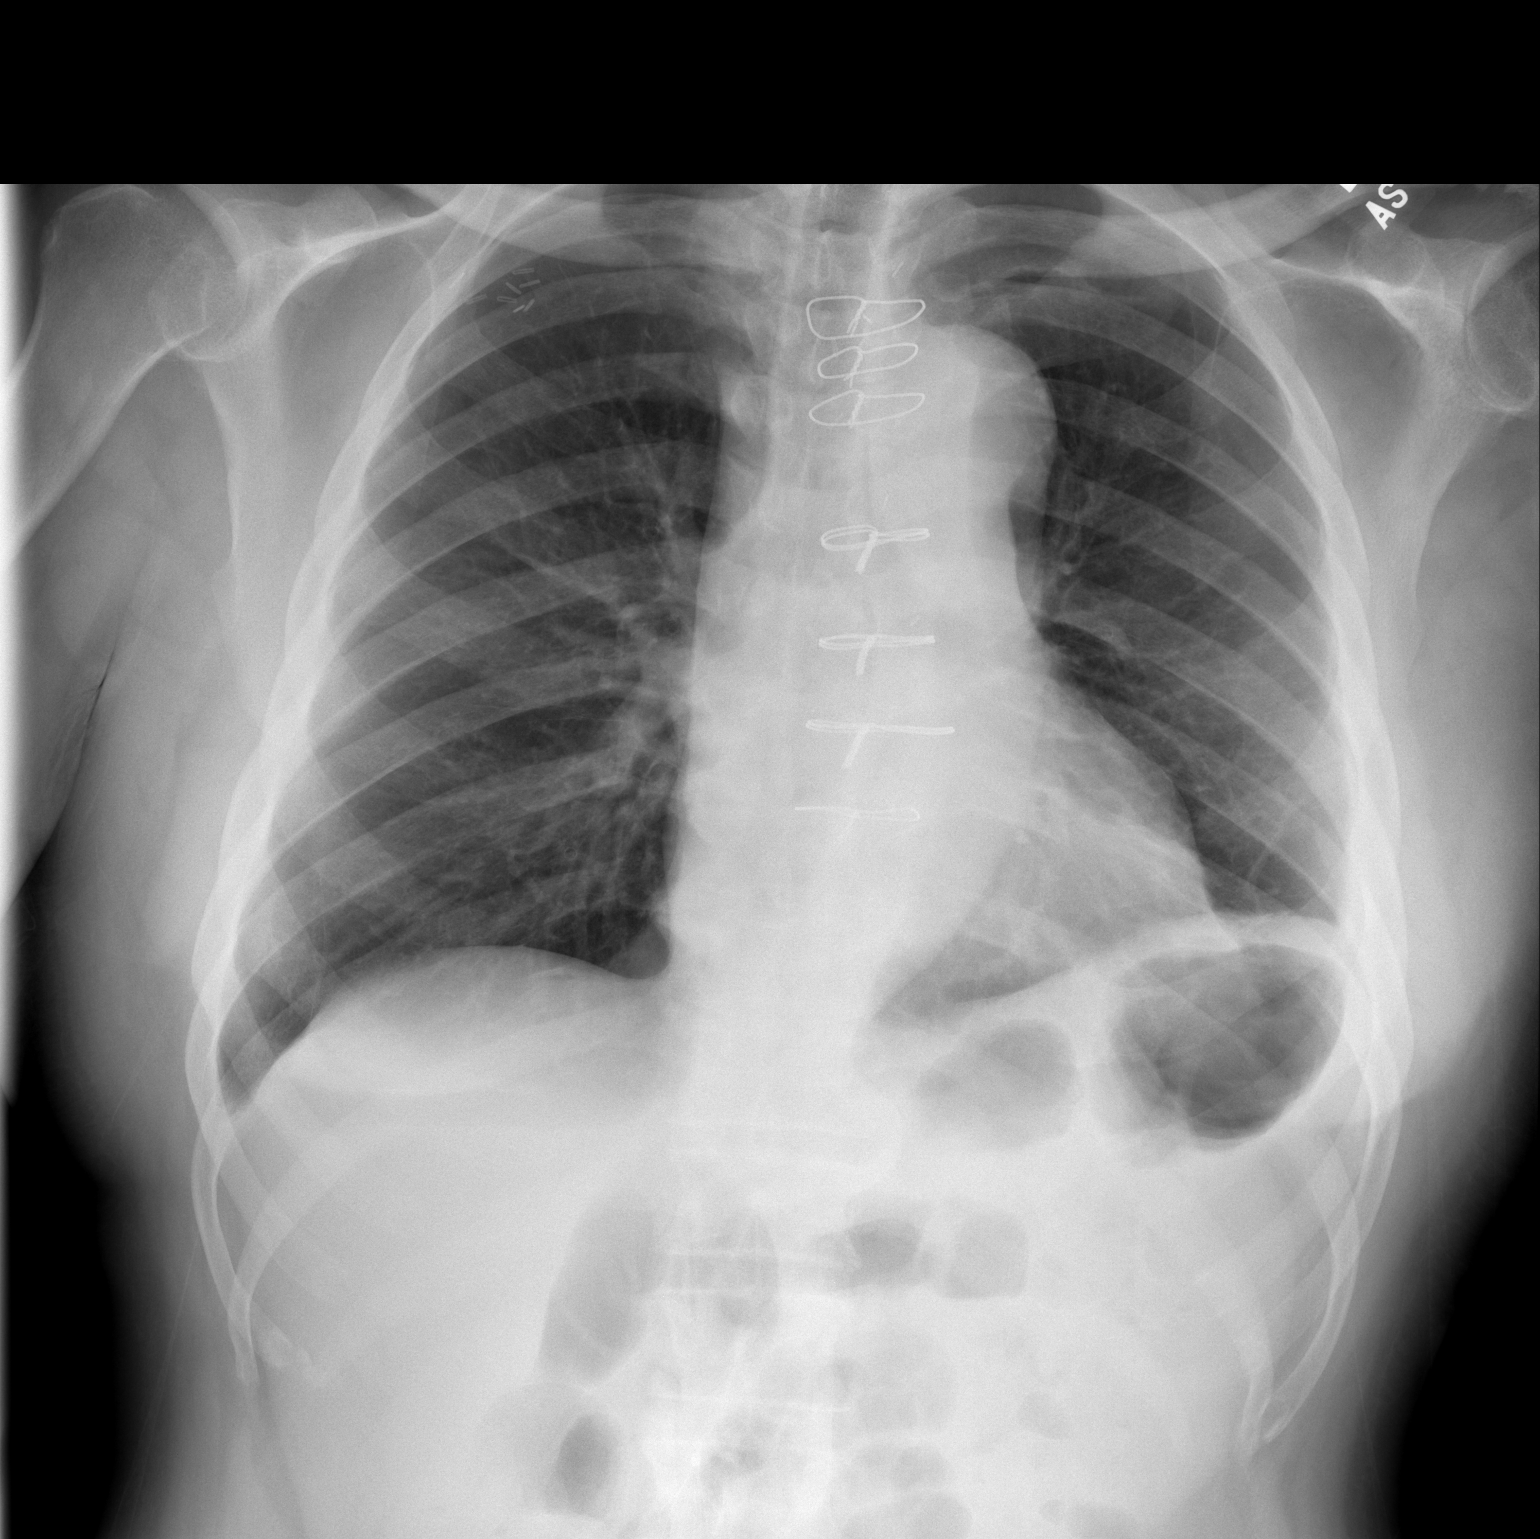

[w chest lat]
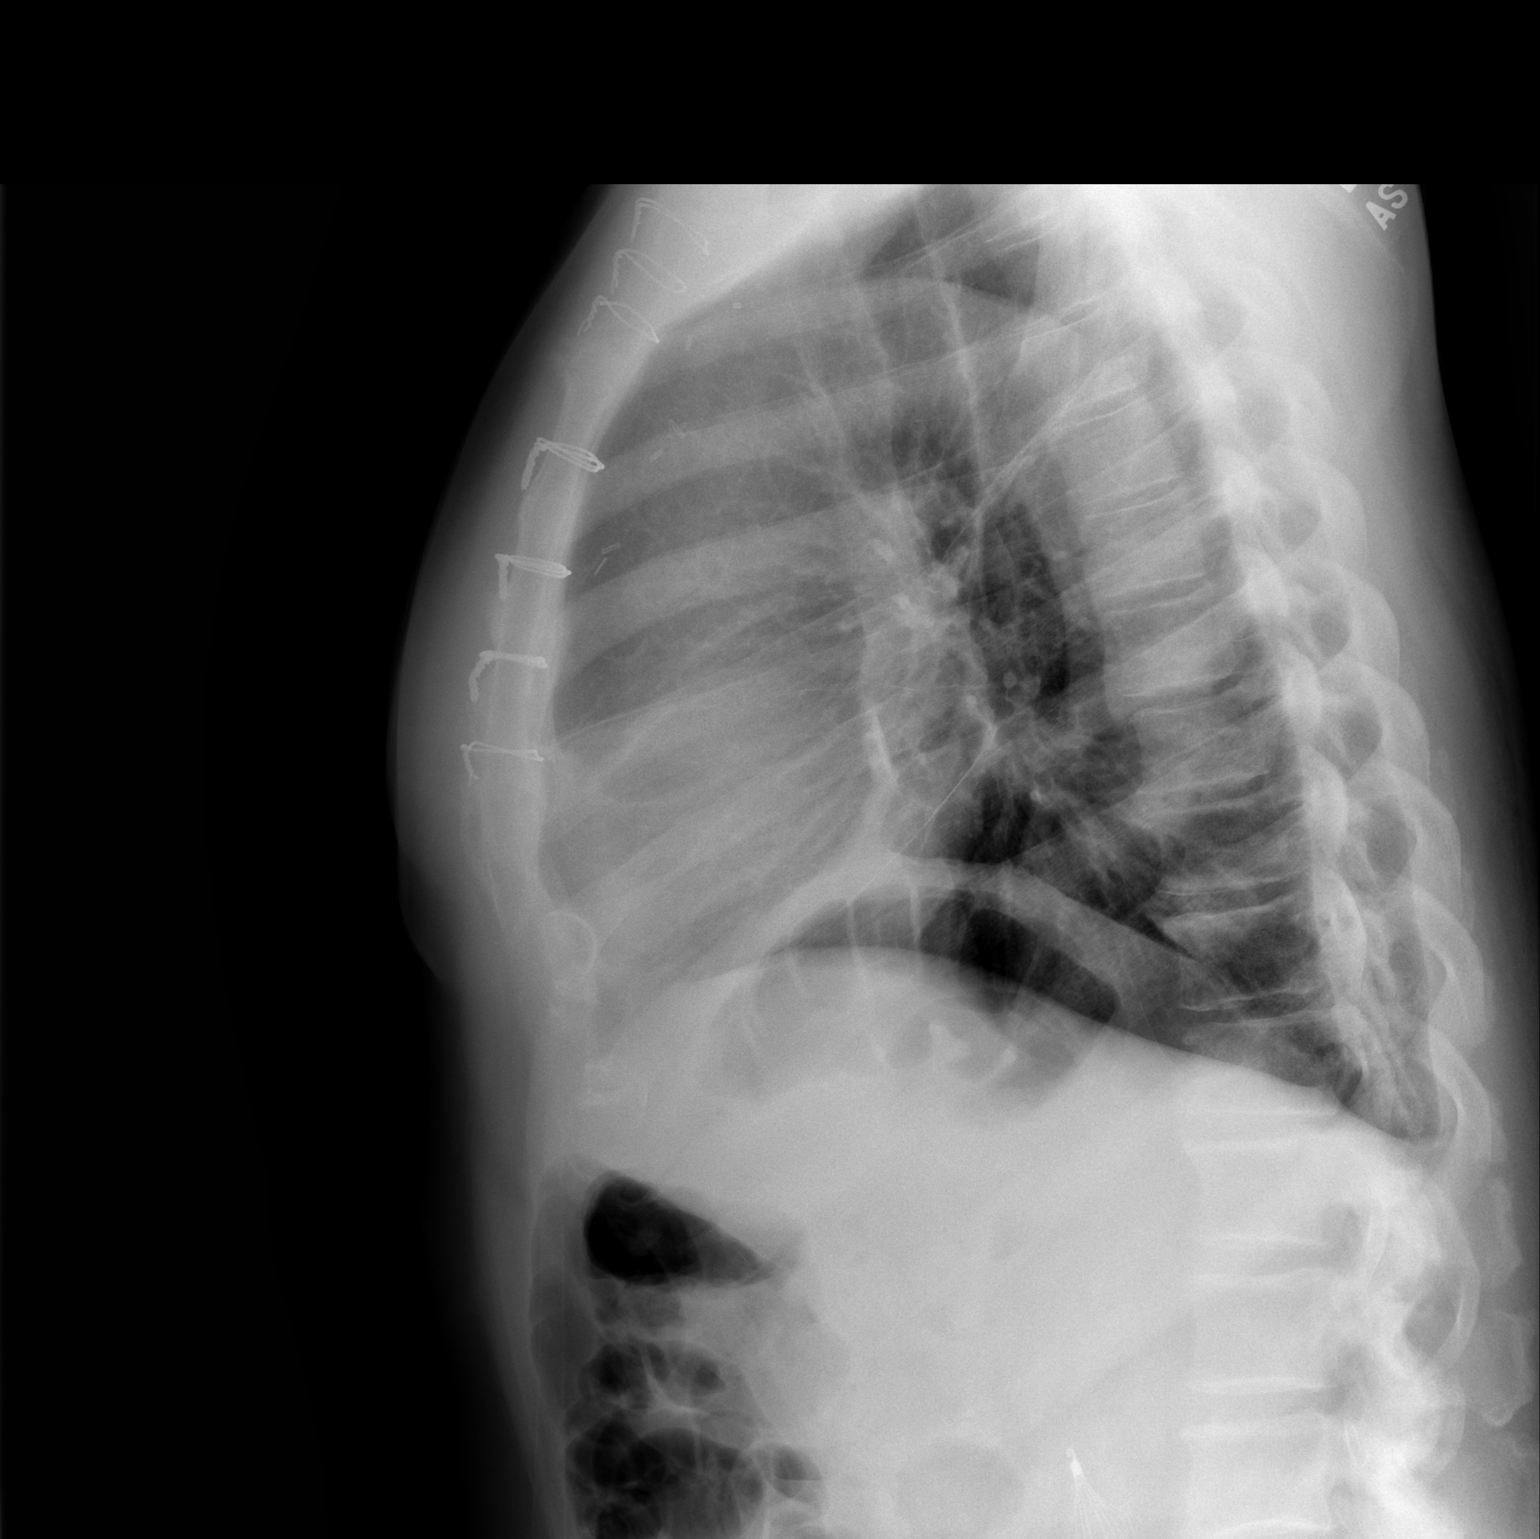

[2 of 2 positions shown; findings below may reference images not displayed]

FINDINGS: Mediastinum hilar structures are normal. Prior median sternotomy.
Heart size stable. Surgical clips in the upper chest. Interval
partial clearing of left base atelectasis. Small left pleural
effusion. No pneumothorax.
IMPRESSION: 1. Postsurgical changes with prior median sternotomy. Heart size
stable.
2. Interim partial clearing of left base atelectasis and left
pleural effusion.

## 2015-05-19 MED ORDER — IOPAMIDOL (ISOVUE-370) INJECTION 76%
100.0000 mL | Freq: Once | INTRAVENOUS | Status: AC | PRN
  Administered 2015-05-19: 100 mL via INTRAVENOUS

## 2015-05-19 NOTE — Progress Notes (Signed)
HPI  Patient returns for follow-up having undergone replacement of the ascending aorta and aortic arch with aorto-innominate and aorto-left carotid bypass on 08/27/2014 for an enlarging aortic aneurysm with dissection. He continues to feel well. His only real complaint is bilateral hip pain. 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 He was also noted to have a LLL pulmonary embolism on his initial follow up CT scan prior to surgery and venous duplex exam showed a DVT in the left common femoral vein. He had a temporary IVC filter placed by IR since he could not be anticoagulated due to urgent aneurysm surgery.  Current Outpatient Prescriptions  Medication Sig Dispense Refill  . amLODipine (NORVASC) 10 MG tablet Take 1 tablet (10 mg total) by mouth daily. 30 tablet 3  . aspirin EC 325 MG EC tablet Take 1 tablet (325 mg total) by mouth daily. 30 tablet 0  . metoprolol tartrate (LOPRESSOR) 25 MG tablet Take 1 tablet (25 mg total) by mouth 2 (two) times daily. 60 tablet 3   No current facility-administered medications for this visit.      Physical Exam BP 140/101 mmHg  Pulse 70  Resp 16  Ht 5\' 9"  (1.753 m)  Wt 210 lb (95.255 kg)  BMI 31.00 kg/m2  SpO2 98% He looks well Cardiac exam shows a regular rate and rhythm with normal heart sounds and no murmurs Lungs are clear The chest scar looks good and the sternum is stable.  Diagnostic Tests:  CLINICAL DATA: Hx of arotic dissection post op 08/2014/ arotic replacement No hx of ca No injury  EXAM: CT ANGIOGRAPHY CHEST WITH CONTRAST  TECHNIQUE: Multidetector CT imaging of the chest was performed using the standard protocol during bolus administration of intravenous contrast. Multiplanar CT image reconstructions and MIPs were obtained to evaluate the vascular anatomy.  CONTRAST: 100 mL Isovue 370 IV. Creatinine was obtained on site at [facility] at [address]Results: Bun 19 creat 1.4 gfr 60  COMPARISON: 08/18/2014  FINDINGS: Right arm contrast injection. The SVC is patent. There is reflux of contrast from the right atrium into the intrahepatic IVC. The RV is nondilated. Satisfactory opacification of pulmonary arteries noted, and there is no evidence of pulmonary emboli. Patent pulmonary veins.  There has been interval median sternotomy. There has been tube graft repair of the ascending aorta with grafts to or reimplantation of right brachiocephalic and left common carotid artery origins. No residual dissection flap in the arch. Proximal descending is mildly ectatic, 4.2 cm diameter, tapering to a diameter of 3.7 cm above the diaphragm, without dissection or stenosis. Visualized portions of proximal abdominal aorta unremarkable.  No pleural or pericardial effusion. No hilar or mediastinal adenopathy. Linear scarring or subsegmental atelectasis in the posterior left upper lobe. 8 mm pleural-based nodular scarring in the lateral basal segment right lower lobe. Lungs otherwise clear.  Spurring in the lower thoracic spine. Right renal cyst partially visualized. Remainder visualized upper abdomen unremarkable.  Review of the MIP images confirms the above findings.  IMPRESSION: 1. Interval ascending aortic graft placement with patent branches to right innominate and left common carotid arteries. 2. 4.2 cm descending thoracic aortic aneurysm, stable.   Electronically Signed  By: Lucrezia Europe M.D.  On: 05/19/2015 12:47   Impression:  His aortic root and descending thoracic aorta are stable in size. The aortic valve sounds normal. His BP is elevated on his  current regimen but he says that he takes his medications as directed. His venous study today shows no evidence of DVT in the legs so I think the IVC filter could be removed since there is no indication for it at this time. We will refer him back to  interventional radiology to discuss removal if possible.   Plan:  He will follow up on his BP with his PCP.  He will be referred to IR to discuss removal of the IVC filter.  I will see him back in one year with a CTA of the chest to follow up on his descending aorta that is mildly enlarged.  [name], MD [facility] [phone]

## 2015-05-19 NOTE — Progress Notes (Signed)
*  Preliminary Results* Bilateral lower extremity venous duplex completed. Bilateral lower extremities are negative for acute deep vein thrombosis. There is no evidence of Baker's cyst bilaterally.  05/19/2015  Maudry Mayhew, RVT, RDCS, RDMS

## 2015-05-27 ENCOUNTER — Other Ambulatory Visit: Payer: Self-pay

## 2015-05-27 DIAGNOSIS — I82412 Acute embolism and thrombosis of left femoral vein: Secondary | ICD-10-CM

## 2015-05-28 DIAGNOSIS — I82412 Acute embolism and thrombosis of left femoral vein: Secondary | ICD-10-CM | POA: Diagnosis not present

## 2015-05-28 DIAGNOSIS — Z1389 Encounter for screening for other disorder: Secondary | ICD-10-CM | POA: Diagnosis not present

## 2015-05-28 DIAGNOSIS — E669 Obesity, unspecified: Secondary | ICD-10-CM | POA: Diagnosis not present

## 2015-05-28 DIAGNOSIS — Z95828 Presence of other vascular implants and grafts: Secondary | ICD-10-CM | POA: Diagnosis not present

## 2015-05-28 DIAGNOSIS — I10 Essential (primary) hypertension: Secondary | ICD-10-CM | POA: Diagnosis not present

## 2015-05-28 DIAGNOSIS — Z9181 History of falling: Secondary | ICD-10-CM | POA: Diagnosis not present

## 2015-05-28 DIAGNOSIS — Z0000 Encounter for general adult medical examination without abnormal findings: Secondary | ICD-10-CM | POA: Diagnosis not present

## 2015-05-28 DIAGNOSIS — M009 Pyogenic arthritis, unspecified: Secondary | ICD-10-CM | POA: Diagnosis not present

## 2015-05-28 DIAGNOSIS — L309 Dermatitis, unspecified: Secondary | ICD-10-CM | POA: Diagnosis not present

## 2015-06-08 ENCOUNTER — Other Ambulatory Visit (HOSPITAL_COMMUNITY): Payer: Self-pay | Admitting: Diagnostic Radiology

## 2015-06-08 ENCOUNTER — Other Ambulatory Visit (HOSPITAL_COMMUNITY): Payer: Self-pay | Admitting: Interventional Radiology

## 2015-06-08 DIAGNOSIS — I82412 Acute embolism and thrombosis of left femoral vein: Secondary | ICD-10-CM

## 2015-06-23 ENCOUNTER — Other Ambulatory Visit (HOSPITAL_COMMUNITY): Payer: Self-pay | Admitting: Diagnostic Radiology

## 2015-06-23 ENCOUNTER — Ambulatory Visit
Admission: RE | Admit: 2015-06-23 | Discharge: 2015-06-23 | Disposition: A | Discharge status: Home / Self Care | Payer: Medicare Other | Source: Ambulatory Visit | Attending: Interventional Radiology | Admitting: Interventional Radiology

## 2015-06-23 DIAGNOSIS — I82412 Acute embolism and thrombosis of left femoral vein: Secondary | ICD-10-CM

## 2015-06-23 DIAGNOSIS — Z86711 Personal history of pulmonary embolism: Secondary | ICD-10-CM | POA: Diagnosis not present
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
## 2015-08-26 ENCOUNTER — Encounter: Payer: Self-pay | Admitting: Hematology

## 2015-08-26 ENCOUNTER — Telehealth: Payer: Self-pay | Admitting: Hematology

## 2015-08-26 NOTE — Telephone Encounter (Signed)
Mailed np packet Faxed np letter to referring office to contact pt with appt. Referring Dr. Anselm Pancoast Dx-Hypercoagulable workup

## 2015-09-09 ENCOUNTER — Ambulatory Visit: Payer: Self-pay | Admitting: Hematology

## 2015-09-29 ENCOUNTER — Telehealth: Payer: Self-pay | Admitting: Hematology

## 2015-09-29 ENCOUNTER — Telehealth: Payer: Self-pay | Admitting: *Deleted

## 2015-09-29 NOTE — Telephone Encounter (Signed)
Telephone call to Madison at [facility] to make her aware that the patient no showed for his appointment on 4/27. Vickie says that she mailed a letter and called  the patient to see why he didn't show. Patient stated that he didn't have a ride that day. Vickie then encouraged the patient to call us to reschedule. Patient has yet to have done so.

## 2015-09-29 NOTE — Telephone Encounter (Signed)
I s/w pt he will call Hematology to r/s the appt he missed.Edward Hopkins

## 2015-10-07 ENCOUNTER — Encounter: Payer: Self-pay | Admitting: Radiology

## 2015-10-19 ENCOUNTER — Telehealth: Payer: Self-pay | Admitting: Hematology

## 2015-10-19 NOTE — Telephone Encounter (Signed)
returned call and s.w. pt and confirmed appt °

## 2015-10-21 ENCOUNTER — Other Ambulatory Visit (HOSPITAL_BASED_OUTPATIENT_CLINIC_OR_DEPARTMENT_OTHER): Payer: Medicare Other

## 2015-10-21 ENCOUNTER — Encounter: Payer: Self-pay | Admitting: Hematology

## 2015-10-21 ENCOUNTER — Telehealth: Payer: Self-pay | Admitting: Hematology

## 2015-10-21 ENCOUNTER — Ambulatory Visit (HOSPITAL_BASED_OUTPATIENT_CLINIC_OR_DEPARTMENT_OTHER): Payer: Medicare Other | Admitting: Hematology

## 2015-10-21 VITALS — BP 130/96 | HR 72 | Temp 97.7°F | Resp 18 | Ht 69.0 in | Wt 221.3 lb

## 2015-10-21 DIAGNOSIS — I10 Essential (primary) hypertension: Secondary | ICD-10-CM | POA: Diagnosis not present

## 2015-10-21 DIAGNOSIS — I2699 Other pulmonary embolism without acute cor pulmonale: Secondary | ICD-10-CM | POA: Diagnosis not present

## 2015-10-21 DIAGNOSIS — D6859 Other primary thrombophilia: Secondary | ICD-10-CM

## 2015-10-21 LAB — COMPREHENSIVE METABOLIC PANEL
ALBUMIN: 3.5 g/dL (ref 3.5–5.0)
ALT: 103 U/L — ABNORMAL HIGH (ref 0–55)
AST: 95 U/L — AB (ref 5–34)
Alkaline Phosphatase: 69 U/L (ref 40–150)
Anion Gap: 8 mEq/L (ref 3–11)
BUN: 21.6 mg/dL (ref 7.0–26.0)
CHLORIDE: 112 meq/L — AB (ref 98–109)
CO2: 21 meq/L — AB (ref 22–29)
Calcium: 9.3 mg/dL (ref 8.4–10.4)
Creatinine: 1.4 mg/dL — ABNORMAL HIGH (ref 0.7–1.3)
EGFR: 62 mL/min/{1.73_m2} — ABNORMAL LOW (ref >90)
GLUCOSE: 114 mg/dL (ref 70–140)
POTASSIUM: 4.1 meq/L (ref 3.5–5.1)
SODIUM: 141 meq/L (ref 136–145)
Total Bilirubin: 0.61 mg/dL (ref 0.20–1.20)
Total Protein: 8.1 g/dL (ref 6.4–8.3)

## 2015-10-21 LAB — CBC & DIFF AND RETIC
BASO%: 0.2 % (ref 0.0–2.0)
Basophils Absolute: 0 10*3/uL (ref 0.0–0.1)
EOS ABS: 0.4 10*3/uL (ref 0.0–0.5)
EOS%: 7.5 % — ABNORMAL HIGH (ref 0.0–7.0)
HCT: 39.7 % (ref 38.4–49.9)
HEMOGLOBIN: 13.8 g/dL (ref 13.0–17.1)
Immature Retic Fract: 4.9 % (ref 3.00–10.60)
LYMPH%: 45.9 % (ref 14.0–49.0)
MCH: 30.6 pg (ref 27.2–33.4)
MCHC: 34.8 g/dL (ref 32.0–36.0)
MCV: 88 fL (ref 79.3–98.0)
MONO#: 0.4 10*3/uL (ref 0.1–0.9)
MONO%: 7.6 % (ref 0.0–14.0)
NEUT%: 38.8 % — ABNORMAL LOW (ref 39.0–75.0)
NEUTROS ABS: 2 10*3/uL (ref 1.5–6.5)
Platelets: 139 10*3/uL — ABNORMAL LOW (ref 140–400)
RBC: 4.51 10*6/uL (ref 4.20–5.82)
RDW: 13.3 % (ref 11.0–14.6)
RETIC %: 1.95 % — AB (ref 0.80–1.80)
Retic Ct Abs: 87.95 10*3/uL (ref 34.80–93.90)
WBC: 5.2 10*3/uL (ref 4.0–10.3)
lymph#: 2.4 10*3/uL (ref 0.9–3.3)
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
## 2015-10-21 NOTE — Telephone Encounter (Signed)
Gave pt apt & avs.... Pt out of town 20-27 could not come in 2 weeks.. Pt also can only ocme on thursdays in the afternoon after 12:00.Vertis Kelch made per pt request

## 2015-10-22 LAB — LUPUS ANTICOAGULANT PANEL
DRVVT: 35.9 s (ref 0.0–47.0)
PTT-LA: 28.7 s (ref 0.0–43.6)

## 2015-10-22 LAB — PROTEIN S ACTIVITY: PROTEIN S ACTIVITY: 108 % (ref 63–140)

## 2015-10-22 LAB — ANTITHROMBIN III: ANTITHROMBIN ACTIVITY: 102 % (ref 75–135)

## 2015-10-22 LAB — BETA-2-GLYCOPROTEIN I ABS, IGG/M/A
Beta-2 Glyco 1 IgA: <9 GPI IgA units (ref 0–25)
Beta-2 Glyco 1 IgM: <9 GPI IgM units (ref 0–32)
Beta-2 Glycoprotein I Ab, IgG: <9 GPI IgG units (ref 0–20)

## 2015-10-22 LAB — PROTEIN S, TOTAL: Protein S, Total: 127 % (ref 60–150)

## 2015-10-22 LAB — PROTEIN C ACTIVITY: PROTEIN C ACTIVITY: 113 % (ref 73–180)

## 2015-10-23 LAB — CARDIOLIPIN ANTIBODIES, IGG, IGM, IGA
Anticardiolipin Ab,IgA,Qn: <9 U/mL (ref 0–11)
Anticardiolipin Ab,IgG,Qn: <9 GPL U/mL (ref 0–14)
Anticardiolipin Ab,IgM,Qn: <9 [MPL'U]/mL (ref 0–12)

## 2015-10-23 LAB — PROTEIN C, TOTAL: Protein C Antigen: 86 % (ref 60–150)

## 2015-10-28 LAB — FACTOR 5 LEIDEN

## 2015-10-28 LAB — PROTHROMBIN GENE MUTATION

## 2015-11-18 ENCOUNTER — Ambulatory Visit: Payer: Self-pay | Admitting: Hematology

## 2015-12-08 ENCOUNTER — Telehealth: Payer: Self-pay | Admitting: Radiology

## 2015-12-08 NOTE — Telephone Encounter (Signed)
Left msg requesting patient call.  He will need to reschedule follow up app't w/ Dr Irene Limbo (no show on 11/18/2015) prior to app't w/ Dr Anselm Pancoast re:  IVC filter retrieval.  Reece Levy, RN 12/08/2015 9:50 AM

## 2016-05-10 ENCOUNTER — Other Ambulatory Visit: Payer: Self-pay | Admitting: *Deleted

## 2016-05-10 DIAGNOSIS — Z95828 Presence of other vascular implants and grafts: Secondary | ICD-10-CM

## 2016-05-31 ENCOUNTER — Ambulatory Visit: Payer: Medicare Other | Admitting: Surgery

## 2016-05-31 ENCOUNTER — Other Ambulatory Visit: Payer: Self-pay

## 2016-06-14 ENCOUNTER — Ambulatory Visit (INDEPENDENT_AMBULATORY_CARE_PROVIDER_SITE_OTHER): Payer: Medicare Other | Admitting: Surgery

## 2016-06-14 ENCOUNTER — Ambulatory Visit
Admission: RE | Admit: 2016-06-14 | Discharge: 2016-06-14 | Disposition: A | Discharge status: Home / Self Care | Payer: Medicare Other | Source: Ambulatory Visit | Attending: Surgery | Admitting: Surgery

## 2016-06-14 ENCOUNTER — Encounter: Payer: Self-pay | Admitting: Surgery

## 2016-06-14 VITALS — BP 160/88 | HR 88 | Resp 20 | Ht 69.0 in | Wt 225.0 lb

## 2016-06-14 DIAGNOSIS — I7100 Dissection of unspecified site of aorta: Secondary | ICD-10-CM | POA: Diagnosis not present

## 2016-06-14 DIAGNOSIS — I719 Aortic aneurysm of unspecified site, without rupture: Secondary | ICD-10-CM

## 2016-06-14 DIAGNOSIS — R0602 Shortness of breath: Secondary | ICD-10-CM | POA: Diagnosis not present

## 2016-06-14 DIAGNOSIS — Z95828 Presence of other vascular implants and grafts: Secondary | ICD-10-CM

## 2016-06-14 MED ORDER — IOPAMIDOL (ISOVUE-370) INJECTION 76%
75.0000 mL | Freq: Once | INTRAVENOUS | Status: AC | PRN
  Administered 2016-06-14: 75 mL via INTRAVENOUS

## 2016-06-17 ENCOUNTER — Encounter: Payer: Self-pay | Admitting: Surgery

## 2016-06-17 NOTE — Progress Notes (Signed)
HPI:  Patient returns for follow-up of his descending thoracic aorta having undergone replacement of the ascending aorta and aortic arch with aorto-innominate and aorto-left carotid bypass on 08/27/2014 for an enlarging aortic aneurysm with dissection. He continues to feel well. His only real complaint is bilateral hip pain. 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  Current Outpatient Prescriptions  Medication Sig Dispense Refill  . amLODipine (NORVASC) 10 MG tablet Take 1 tablet (10 mg total) by mouth daily. 30 tablet 3  . aspirin EC 325 MG EC tablet Take 1 tablet (325 mg total) by mouth daily. 30 tablet 0  . metoprolol tartrate (LOPRESSOR) 25 MG tablet Take 1 tablet (25 mg total) by mouth 2 (two) times daily. 60 tablet 3   No current facility-administered medications for this visit.      Physical Exam: BP (!) 160/88   Pulse 88   Resp 20   Ht 5\' 9"  (1.753 m)   Wt 225 lb (102.1 kg)   BMI 33.23 kg/m  He looks well Cardiac exam shows a regular rate and rhythm with normal heart sounds and no murmurs Lungs are clear  Diagnostic Tests:  CLINICAL DATA:  66 year old male status post graft replacement of the ascending thoracic aorta. Fatigue, hypertension, dizziness and shortness of breath for the past 2 months.  EXAM: CT ANGIOGRAPHY CHEST WITH CONTRAST  TECHNIQUE: Multidetector CT imaging of the chest was performed using the standard protocol during bolus administration of intravenous contrast. Multiplanar CT image reconstructions and MIPs were obtained to evaluate the vascular anatomy.  CONTRAST:  75 mL of Isovue 370.  COMPARISON:  Chest CT 05/19/2015.  FINDINGS: Cardiovascular: Heart size is normal. There is no significant pericardial fluid, thickening or pericardial calcification. There is aortic atherosclerosis, as well as atherosclerosis of the great vessels of the mediastinum and the  coronary arteries, including calcified atherosclerotic plaque in the left anterior descending, left circumflex and right coronary arteries. Postoperative changes of graft repair of the ascending thoracic aorta and proximal great vessels is redemonstrated. No unexpected soft tissue thickening or fluid is noted around the graft. The graft is widely patent. Both proximal and distal anastomoses are normal in appearance. Aortic arch and descending thoracic aorta both measure up to 3.3 cm in diameter. There is no evidence of recurrent dissection involving the aortic arch or descending thoracic aorta. Precontrast images demonstrate no crescentic high attenuation associated with the wall of the thoracic aorta in these regions to suggest acute intramural hemorrhage.  Mediastinum/Nodes: No pathologically enlarged mediastinal or hilar lymph nodes. Esophagus is unremarkable in appearance. No axillary lymphadenopathy.  Lungs/Pleura: Several tiny pulmonary nodules scattered throughout the lungs bilaterally appear stable in number and size compared to the prior examination, with the largest of these measuring 8 mm in the subpleural aspect of the right lower lobe (image 91 of series 5), presumably a small area of scarring. No other new suspicious appearing pulmonary nodules or masses are noted. Linear scarring in the left upper lobe is unchanged. No acute consolidative airspace disease. No pleural effusions.  Upper Abdomen: Unremarkable.  Musculoskeletal: Median sternotomy wires. There are no aggressive appearing lytic or blastic lesions noted in the visualized portions of the skeleton.  Review of the MIP images confirms the above findings.  IMPRESSION: 1. Status post graft replacement of the ascending thoracic aorta and proximal great vessels, with no complicating features, and no evidence of recurrent aortic  dissection, as detailed above. 2. Aortic atherosclerosis, in addition to left  main and 3 vessel coronary artery disease. Please note that although the presence of coronary artery calcium documents the presence of coronary artery disease, the severity of this disease and any potential stenosis cannot be assessed on this non-gated CT examination. Assessment for potential risk factor modification, dietary therapy or pharmacologic therapy may be warranted, if clinically indicated. 3. Stable appearance of several small pulmonary nodules, favored to be benign. Attention at time of routine imaging follow-up is recommended to ensure continued stability.   Electronically Signed   By: Edward Hopkins M.D.   On: 06/14/2016 15:13   Impression:  His aortic root and descending thoracic aorta are stable in size and only mildly aneurysmal.  The aortic valve sounds normal. This should be followed up to be sure that it does not progressively enlarge.  Plan:  I will see him back in one year with a CT of the chest to follow up on his descending aorta.   Edward Pollack, MD [facility] [phone]

## 2016-12-01 ENCOUNTER — Other Ambulatory Visit: Payer: Self-pay

## 2017-01-03 ENCOUNTER — Telehealth: Payer: Self-pay | Admitting: Radiology

## 2017-01-03 NOTE — Telephone Encounter (Signed)
Left message on answering machine requesting patient call to schedule follow up appointment with Dr. Markus Daft to discuss possible IVC filter retrieval.  Reece Levy, RN 01/03/2017 4:55 PM

## 2017-02-15 ENCOUNTER — Other Ambulatory Visit (HOSPITAL_COMMUNITY): Payer: Self-pay | Admitting: Diagnostic Radiology

## 2017-02-15 DIAGNOSIS — Z86718 Personal history of other venous thrombosis and embolism: Secondary | ICD-10-CM

## 2017-02-20 ENCOUNTER — Encounter: Payer: Self-pay | Admitting: Radiology

## 2017-03-07 ENCOUNTER — Ambulatory Visit
Admission: RE | Admit: 2017-03-07 | Discharge: 2017-03-07 | Disposition: A | Discharge status: Home / Self Care | Payer: Medicare Other | Source: Ambulatory Visit | Attending: Diagnostic Radiology | Admitting: Diagnostic Radiology

## 2017-03-07 DIAGNOSIS — Z4689 Encounter for fitting and adjustment of other specified devices: Secondary | ICD-10-CM | POA: Diagnosis not present

## 2017-03-07 DIAGNOSIS — Z86718 Personal history of other venous thrombosis and embolism: Secondary | ICD-10-CM | POA: Diagnosis not present

## 2017-03-07 DIAGNOSIS — Z86711 Personal history of pulmonary embolism: Secondary | ICD-10-CM | POA: Diagnosis not present

## 2017-03-07 HISTORY — PX: IR RADIOLOGIST EVAL & MGMT: IMG5224
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
## 2017-03-14 ENCOUNTER — Encounter: Payer: Self-pay | Admitting: Diagnostic Radiology

## 2017-05-10 ENCOUNTER — Other Ambulatory Visit: Payer: Self-pay

## 2017-05-10 DIAGNOSIS — I712 Thoracic aortic aneurysm, without rupture, unspecified: Secondary | ICD-10-CM

## 2017-06-13 ENCOUNTER — Other Ambulatory Visit: Payer: Self-pay

## 2017-06-13 ENCOUNTER — Other Ambulatory Visit: Payer: Self-pay | Admitting: Surgery

## 2017-06-13 ENCOUNTER — Encounter: Payer: Self-pay | Admitting: Surgery

## 2017-06-13 ENCOUNTER — Ambulatory Visit
Admission: RE | Admit: 2017-06-13 | Discharge: 2017-06-13 | Disposition: A | Discharge status: Home / Self Care | Payer: Medicare Other | Source: Ambulatory Visit | Attending: Surgery | Admitting: Surgery

## 2017-06-13 ENCOUNTER — Ambulatory Visit (INDEPENDENT_AMBULATORY_CARE_PROVIDER_SITE_OTHER): Payer: Medicare Other | Admitting: Surgery

## 2017-06-13 VITALS — BP 137/103 | HR 95 | Resp 18 | Ht 69.0 in | Wt 220.8 lb

## 2017-06-13 DIAGNOSIS — I719 Aortic aneurysm of unspecified site, without rupture: Secondary | ICD-10-CM

## 2017-06-13 DIAGNOSIS — I712 Thoracic aortic aneurysm, without rupture, unspecified: Secondary | ICD-10-CM

## 2017-06-13 DIAGNOSIS — I7100 Dissection of unspecified site of aorta: Secondary | ICD-10-CM | POA: Diagnosis not present

## 2017-06-13 MED ORDER — IOPAMIDOL (ISOVUE-370) INJECTION 76%
75.0000 mL | Freq: Once | INTRAVENOUS | Status: AC | PRN
  Administered 2017-06-13: 75 mL via INTRAVENOUS

## 2017-06-13 NOTE — Progress Notes (Signed)
HPI:  Patient returns for follow-up of his descending thoracic aorta having undergone replacement of the ascending aorta and aortic arch with aorto-innominate and aorto-left carotid bypass on 08/27/2014 for an enlarging aortic aneurysm with dissection. He continues to feel well. His only real complaint is bilateral hip pain and back pain due to degenerative arthritis. 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   Current Outpatient Medications  Medication Sig Dispense Refill  . amLODipine (NORVASC) 10 MG tablet Take 1 tablet (10 mg total) by mouth daily. 30 tablet 3  . aspirin EC 325 MG EC tablet Take 1 tablet (325 mg total) by mouth daily. 30 tablet 0  . metoprolol tartrate (LOPRESSOR) 25 MG tablet Take 1 tablet (25 mg total) by mouth 2 (two) times daily. 60 tablet 3   No current facility-administered medications for this visit.      Physical Exam: BP (!) 137/103 (BP Location: Right Arm, Patient Position: Sitting, Cuff Size: Large)   Pulse 95   Resp 18   Ht 5\' 9"  (1.753 m)   Wt 220 lb 12.8 oz (100.2 kg)   SpO2 97% Comment: RA  BMI 32.61 kg/m  He looks well Cardiac exam shows a regular rate and rhythm with normal heart sounds and no murmurs Lungs are clear    Diagnostic Tests:  CLINICAL DATA:  Thoracic aortic aneurysm, shortness of breath.  EXAM: CT ANGIOGRAPHY CHEST WITH CONTRAST  TECHNIQUE: Multidetector CT imaging of the chest was performed using the standard protocol during bolus administration of intravenous contrast. Multiplanar CT image reconstructions and MIPs were obtained to evaluate the vascular anatomy.  CONTRAST:  27mL ISOVUE-370 IOPAMIDOL (ISOVUE-370) INJECTION 76%  Creatinine was obtained on site at [facility] at [address]  Results: Creatinine 1.3 mg/dL.  COMPARISON:  06/14/2016.  FINDINGS: Cardiovascular: Status post ascending aortic repair. Ascending  aorta measures up to 3.8 cm, stable. Transverse aorta measures up to 4.2 cm and proximal descending thoracic aorta, 3.9 cm, also stable. Pulmonary arteries are enlarged. Heart size within normal limits. No pericardial effusion.  Mediastinum/Nodes: Mediastinal lymph nodes are not enlarged by CT size criteria. No hilar or axillary adenopathy. Esophagus is grossly unremarkable.  Lungs/Pleura: Subpleural scarring in the peripheral right lower lobe. Scattered pulmonary nodules measure 4 mm or less in size, are stable and considered benign. Linear scarring in the left upper lobe. Mild scarring in the left lower lobe as well. No pleural fluid. Airway is unremarkable.  Upper Abdomen: Low-attenuation lesions in the liver measure up to 1.4 cm in the posterior right hepatic lobe, as before. Visualized portions of the liver, gallbladder and adrenal glands are otherwise grossly unremarkable. 1.8 cm low-attenuation lesion in the upper pole right kidney is incompletely imaged. Visualized portions of the spleen, pancreas, stomach and bowel are grossly unremarkable. Upper abdominal lymph nodes are not enlarged by CT size criteria.  Musculoskeletal: No worrisome lytic or sclerotic lesions.  Review of the MIP images confirms the above findings.  IMPRESSION: 1. Graft repair of the ascending aorta without complicating feature. 2. Ectatic/aneurysmal transverse and descending thoracic aorta, stable. 3.  Aortic atherosclerosis (ICD10-170.0). 4. Enlarged pulmonary arteries, indicative of pulmonary arterial hypertension. 5. Scattered small pulmonary nodules are unchanged and considered benign.   Electronically Signed   By: Lorin Picket M.D.   On: 06/13/2017 11:12  Impression:  He has stable aneurysmal enlargement of the distal aortic arch at 4.2  cm and the proximal descending thoracic aorta at 3.9 cm.  His aortic root is not aneurysmal.  There is no evidence of new aortic dissection.  I  reviewed the CT scan images with him today and answered his questions.  His blood pressure is elevated but he said that he did not take his blood pressure medication this morning.  I stressed the importance of good blood pressure control in preventing enlargement of his aortic aneurysm and development of aortic dissection.  He said that he understands this and will try to take his medications on a regular basis.  Plan:  I will plan to see him back in 1 year with a CTA of the chest.   I spent 15 minutes performing this established patient evaluation and > 50% of this time was spent face to face counseling and coordinating the care of this patient's aortic aneurysm.    [name], MD [facility] [phone]

## 2018-04-24 ENCOUNTER — Other Ambulatory Visit: Payer: Self-pay | Admitting: Surgery

## 2018-04-24 DIAGNOSIS — I712 Thoracic aortic aneurysm, without rupture, unspecified: Secondary | ICD-10-CM

## 2018-06-11 ENCOUNTER — Other Ambulatory Visit: Payer: Self-pay | Admitting: Surgery

## 2018-06-12 ENCOUNTER — Ambulatory Visit: Payer: Medicare Other | Admitting: Surgery

## 2018-06-12 ENCOUNTER — Inpatient Hospital Stay: Admission: RE | Admit: 2018-06-12 | Payer: Self-pay | Source: Ambulatory Visit

## 2018-06-18 ENCOUNTER — Inpatient Hospital Stay: Admission: RE | Admit: 2018-06-18 | Payer: Self-pay | Source: Ambulatory Visit

## 2018-07-03 ENCOUNTER — Encounter: Payer: Self-pay | Admitting: Surgery

## 2018-07-03 ENCOUNTER — Ambulatory Visit: Payer: Medicare Other | Admitting: Surgery

## 2019-12-16 ENCOUNTER — Other Ambulatory Visit: Payer: Self-pay | Admitting: Nurse Practitioner

## 2019-12-16 DIAGNOSIS — R748 Abnormal levels of other serum enzymes: Secondary | ICD-10-CM

## 2019-12-16 DIAGNOSIS — B182 Chronic viral hepatitis C: Secondary | ICD-10-CM

## 2019-12-22 ENCOUNTER — Other Ambulatory Visit: Payer: Medicare Other

## 2019-12-24 ENCOUNTER — Ambulatory Visit
Admission: RE | Admit: 2019-12-24 | Discharge: 2019-12-24 | Disposition: A | Discharge status: Home / Self Care | Payer: Medicare Other | Source: Ambulatory Visit | Attending: Nurse Practitioner | Admitting: Nurse Practitioner

## 2019-12-24 DIAGNOSIS — R748 Abnormal levels of other serum enzymes: Secondary | ICD-10-CM

## 2019-12-24 DIAGNOSIS — B182 Chronic viral hepatitis C: Secondary | ICD-10-CM

## 2020-05-21 ENCOUNTER — Other Ambulatory Visit: Payer: Self-pay | Admitting: Nurse Practitioner

## 2020-05-21 DIAGNOSIS — K7402 Hepatic fibrosis, advanced fibrosis: Secondary | ICD-10-CM

## 2020-06-14 ENCOUNTER — Other Ambulatory Visit: Payer: Medicare Other

## 2020-06-14 ENCOUNTER — Ambulatory Visit
Admission: RE | Admit: 2020-06-14 | Discharge: 2020-06-14 | Disposition: A | Discharge status: Home / Self Care | Payer: Medicare Other | Source: Ambulatory Visit | Attending: Nurse Practitioner | Admitting: Nurse Practitioner

## 2020-06-14 DIAGNOSIS — K7402 Hepatic fibrosis, advanced fibrosis: Secondary | ICD-10-CM

## 2020-07-22 ENCOUNTER — Other Ambulatory Visit: Payer: Self-pay

## 2020-07-22 ENCOUNTER — Emergency Department (HOSPITAL_COMMUNITY): Payer: Medicare Other

## 2020-07-22 ENCOUNTER — Encounter (HOSPITAL_COMMUNITY): Payer: Self-pay | Admitting: Pharmacy Technician

## 2020-07-22 ENCOUNTER — Emergency Department (HOSPITAL_COMMUNITY)
Admission: EM | Admit: 2020-07-22 | Discharge: 2020-07-22 | Disposition: A | Discharge status: Home / Self Care | Payer: Medicare Other | Attending: Emergency Medicine | Admitting: Emergency Medicine

## 2020-07-22 DIAGNOSIS — I110 Hypertensive heart disease with heart failure: Secondary | ICD-10-CM | POA: Insufficient documentation

## 2020-07-22 DIAGNOSIS — S8992XA Unspecified injury of left lower leg, initial encounter: Secondary | ICD-10-CM | POA: Diagnosis present

## 2020-07-22 DIAGNOSIS — Z7982 Long term (current) use of aspirin: Secondary | ICD-10-CM | POA: Insufficient documentation

## 2020-07-22 DIAGNOSIS — S0990XA Unspecified injury of head, initial encounter: Secondary | ICD-10-CM | POA: Insufficient documentation

## 2020-07-22 DIAGNOSIS — F17200 Nicotine dependence, unspecified, uncomplicated: Secondary | ICD-10-CM | POA: Insufficient documentation

## 2020-07-22 DIAGNOSIS — I509 Heart failure, unspecified: Secondary | ICD-10-CM | POA: Insufficient documentation

## 2020-07-22 DIAGNOSIS — Z79899 Other long term (current) drug therapy: Secondary | ICD-10-CM | POA: Insufficient documentation

## 2020-07-22 DIAGNOSIS — Y92410 Unspecified street and highway as the place of occurrence of the external cause: Secondary | ICD-10-CM | POA: Diagnosis not present
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
## 2021-07-27 ENCOUNTER — Inpatient Hospital Stay (HOSPITAL_COMMUNITY)
Admission: EM | Admit: 2021-07-27 | Discharge: 2021-08-11 | DRG: 219 | Disposition: A | Discharge status: Rehabilitation Facility | Payer: Medicare Other | Attending: Family Medicine | Admitting: Family Medicine

## 2021-07-27 ENCOUNTER — Emergency Department (HOSPITAL_COMMUNITY): Payer: Medicare Other

## 2021-07-27 ENCOUNTER — Other Ambulatory Visit: Payer: Self-pay

## 2021-07-27 DIAGNOSIS — I639 Cerebral infarction, unspecified: Secondary | ICD-10-CM | POA: Diagnosis not present

## 2021-07-27 DIAGNOSIS — I719 Aortic aneurysm of unspecified site, without rupture: Secondary | ICD-10-CM

## 2021-07-27 DIAGNOSIS — N1411 Contrast-induced nephropathy: Secondary | ICD-10-CM | POA: Diagnosis not present

## 2021-07-27 DIAGNOSIS — E669 Obesity, unspecified: Secondary | ICD-10-CM | POA: Diagnosis present

## 2021-07-27 DIAGNOSIS — Z9114 Patient's other noncompliance with medication regimen: Secondary | ICD-10-CM

## 2021-07-27 DIAGNOSIS — I7113 Aneurysm of the descending thoracic aorta, ruptured: Principal | ICD-10-CM | POA: Diagnosis present

## 2021-07-27 DIAGNOSIS — T82310A Breakdown (mechanical) of aortic (bifurcation) graft (replacement), initial encounter: Secondary | ICD-10-CM | POA: Diagnosis not present

## 2021-07-27 DIAGNOSIS — I5033 Acute on chronic diastolic (congestive) heart failure: Secondary | ICD-10-CM | POA: Diagnosis present

## 2021-07-27 DIAGNOSIS — I712 Thoracic aortic aneurysm, without rupture, unspecified: Secondary | ICD-10-CM | POA: Diagnosis not present

## 2021-07-27 DIAGNOSIS — K4020 Bilateral inguinal hernia, without obstruction or gangrene, not specified as recurrent: Secondary | ICD-10-CM | POA: Diagnosis present

## 2021-07-27 DIAGNOSIS — I452 Bifascicular block: Secondary | ICD-10-CM | POA: Diagnosis present

## 2021-07-27 DIAGNOSIS — E871 Hypo-osmolality and hyponatremia: Secondary | ICD-10-CM | POA: Diagnosis not present

## 2021-07-27 DIAGNOSIS — R579 Shock, unspecified: Secondary | ICD-10-CM

## 2021-07-27 DIAGNOSIS — I161 Hypertensive emergency: Secondary | ICD-10-CM | POA: Diagnosis present

## 2021-07-27 DIAGNOSIS — R739 Hyperglycemia, unspecified: Secondary | ICD-10-CM | POA: Diagnosis not present

## 2021-07-27 DIAGNOSIS — J9602 Acute respiratory failure with hypercapnia: Secondary | ICD-10-CM | POA: Diagnosis not present

## 2021-07-27 DIAGNOSIS — N183 Chronic kidney disease, stage 3 unspecified: Secondary | ICD-10-CM | POA: Diagnosis not present

## 2021-07-27 DIAGNOSIS — N390 Urinary tract infection, site not specified: Secondary | ICD-10-CM | POA: Diagnosis not present

## 2021-07-27 DIAGNOSIS — I7100 Dissection of unspecified site of aorta: Principal | ICD-10-CM

## 2021-07-27 DIAGNOSIS — B9620 Unspecified Escherichia coli [E. coli] as the cause of diseases classified elsewhere: Secondary | ICD-10-CM | POA: Diagnosis not present

## 2021-07-27 DIAGNOSIS — N184 Chronic kidney disease, stage 4 (severe): Secondary | ICD-10-CM | POA: Diagnosis present

## 2021-07-27 DIAGNOSIS — D62 Acute posthemorrhagic anemia: Secondary | ICD-10-CM | POA: Diagnosis present

## 2021-07-27 DIAGNOSIS — Z6831 Body mass index (BMI) 31.0-31.9, adult: Secondary | ICD-10-CM

## 2021-07-27 DIAGNOSIS — M549 Dorsalgia, unspecified: Secondary | ICD-10-CM | POA: Diagnosis not present

## 2021-07-27 DIAGNOSIS — I10 Essential (primary) hypertension: Secondary | ICD-10-CM | POA: Diagnosis not present

## 2021-07-27 DIAGNOSIS — Z86718 Personal history of other venous thrombosis and embolism: Secondary | ICD-10-CM | POA: Diagnosis not present

## 2021-07-27 DIAGNOSIS — B182 Chronic viral hepatitis C: Secondary | ICD-10-CM | POA: Diagnosis present

## 2021-07-27 DIAGNOSIS — I69392 Facial weakness following cerebral infarction: Secondary | ICD-10-CM | POA: Diagnosis not present

## 2021-07-27 DIAGNOSIS — I130 Hypertensive heart and chronic kidney disease with heart failure and stage 1 through stage 4 chronic kidney disease, or unspecified chronic kidney disease: Secondary | ICD-10-CM | POA: Diagnosis present

## 2021-07-27 DIAGNOSIS — M25571 Pain in right ankle and joints of right foot: Secondary | ICD-10-CM | POA: Diagnosis not present

## 2021-07-27 DIAGNOSIS — F1410 Cocaine abuse, uncomplicated: Secondary | ICD-10-CM | POA: Diagnosis present

## 2021-07-27 DIAGNOSIS — R578 Other shock: Secondary | ICD-10-CM | POA: Diagnosis present

## 2021-07-27 DIAGNOSIS — F17210 Nicotine dependence, cigarettes, uncomplicated: Secondary | ICD-10-CM | POA: Diagnosis present

## 2021-07-27 DIAGNOSIS — R34 Anuria and oliguria: Secondary | ICD-10-CM | POA: Diagnosis not present

## 2021-07-27 DIAGNOSIS — I71019 Dissection of thoracic aorta, unspecified: Secondary | ICD-10-CM | POA: Diagnosis present

## 2021-07-27 DIAGNOSIS — K567 Ileus, unspecified: Secondary | ICD-10-CM | POA: Diagnosis not present

## 2021-07-27 DIAGNOSIS — Z515 Encounter for palliative care: Secondary | ICD-10-CM | POA: Diagnosis not present

## 2021-07-27 DIAGNOSIS — D696 Thrombocytopenia, unspecified: Secondary | ICD-10-CM | POA: Diagnosis present

## 2021-07-27 DIAGNOSIS — R5381 Other malaise: Secondary | ICD-10-CM | POA: Diagnosis present

## 2021-07-27 DIAGNOSIS — K661 Hemoperitoneum: Secondary | ICD-10-CM | POA: Diagnosis not present

## 2021-07-27 DIAGNOSIS — J9601 Acute respiratory failure with hypoxia: Secondary | ICD-10-CM | POA: Diagnosis not present

## 2021-07-27 DIAGNOSIS — I69351 Hemiplegia and hemiparesis following cerebral infarction affecting right dominant side: Secondary | ICD-10-CM | POA: Diagnosis not present

## 2021-07-27 DIAGNOSIS — N179 Acute kidney failure, unspecified: Secondary | ICD-10-CM | POA: Diagnosis present

## 2021-07-27 DIAGNOSIS — R410 Disorientation, unspecified: Secondary | ICD-10-CM | POA: Diagnosis not present

## 2021-07-27 DIAGNOSIS — Y712 Prosthetic and other implants, materials and accessory cardiovascular devices associated with adverse incidents: Secondary | ICD-10-CM | POA: Diagnosis not present

## 2021-07-27 DIAGNOSIS — Z79899 Other long term (current) drug therapy: Secondary | ICD-10-CM

## 2021-07-27 DIAGNOSIS — I2693 Single subsegmental pulmonary embolism without acute cor pulmonale: Secondary | ICD-10-CM | POA: Diagnosis not present

## 2021-07-27 DIAGNOSIS — Z7189 Other specified counseling: Secondary | ICD-10-CM | POA: Diagnosis not present

## 2021-07-27 DIAGNOSIS — D631 Anemia in chronic kidney disease: Secondary | ICD-10-CM | POA: Diagnosis present

## 2021-07-27 DIAGNOSIS — I5032 Chronic diastolic (congestive) heart failure: Secondary | ICD-10-CM | POA: Diagnosis present

## 2021-07-27 DIAGNOSIS — Z7982 Long term (current) use of aspirin: Secondary | ICD-10-CM

## 2021-07-27 DIAGNOSIS — T508X5A Adverse effect of diagnostic agents, initial encounter: Secondary | ICD-10-CM | POA: Diagnosis not present

## 2021-07-27 DIAGNOSIS — E785 Hyperlipidemia, unspecified: Secondary | ICD-10-CM | POA: Diagnosis present

## 2021-07-27 DIAGNOSIS — J9811 Atelectasis: Secondary | ICD-10-CM | POA: Diagnosis not present

## 2021-07-27 DIAGNOSIS — R531 Weakness: Secondary | ICD-10-CM | POA: Diagnosis not present

## 2021-07-27 DIAGNOSIS — R29810 Facial weakness: Secondary | ICD-10-CM | POA: Diagnosis not present

## 2021-07-27 DIAGNOSIS — R0602 Shortness of breath: Secondary | ICD-10-CM | POA: Diagnosis not present

## 2021-07-27 DIAGNOSIS — Z95828 Presence of other vascular implants and grafts: Secondary | ICD-10-CM | POA: Diagnosis not present

## 2021-07-27 DIAGNOSIS — Z20822 Contact with and (suspected) exposure to covid-19: Secondary | ICD-10-CM | POA: Diagnosis present

## 2021-07-27 DIAGNOSIS — I6310 Cerebral infarction due to embolism of unspecified precerebral artery: Secondary | ICD-10-CM | POA: Diagnosis not present

## 2021-07-27 DIAGNOSIS — R609 Edema, unspecified: Secondary | ICD-10-CM | POA: Diagnosis not present

## 2021-07-27 DIAGNOSIS — I718 Aortic aneurysm of unspecified site, ruptured: Secondary | ICD-10-CM

## 2021-07-27 DIAGNOSIS — E874 Mixed disorder of acid-base balance: Secondary | ICD-10-CM | POA: Diagnosis present

## 2021-07-27 DIAGNOSIS — Z9981 Dependence on supplemental oxygen: Secondary | ICD-10-CM | POA: Diagnosis not present

## 2021-07-27 DIAGNOSIS — I7101 Dissection of ascending aorta: Secondary | ICD-10-CM | POA: Diagnosis not present

## 2021-07-27 DIAGNOSIS — I71012 Dissection of descending thoracic aorta: Secondary | ICD-10-CM | POA: Diagnosis not present

## 2021-07-27 LAB — URINALYSIS, ROUTINE W REFLEX MICROSCOPIC
Bilirubin Urine: NEGATIVE
Glucose, UA: NEGATIVE mg/dL
Hgb urine dipstick: NEGATIVE
Ketones, ur: 5 mg/dL — AB
Leukocytes,Ua: NEGATIVE
Nitrite: NEGATIVE
Protein, ur: 30 mg/dL — AB
Specific Gravity, Urine: 1.023 (ref 1.005–1.030)
pH: 5 (ref 5.0–8.0)

## 2021-07-27 LAB — COMPREHENSIVE METABOLIC PANEL
ALT: 14 U/L (ref 0–44)
AST: 19 U/L (ref 15–41)
Albumin: 3.5 g/dL (ref 3.5–5.0)
Alkaline Phosphatase: 62 U/L (ref 38–126)
Anion gap: 12 (ref 5–15)
BUN: 21 mg/dL (ref 8–23)
CO2: 24 mmol/L (ref 22–32)
Calcium: 9.4 mg/dL (ref 8.9–10.3)
Chloride: 101 mmol/L (ref 98–111)
Creatinine, Ser: 1.27 mg/dL — ABNORMAL HIGH (ref 0.61–1.24)
GFR, Estimated: 60 mL/min — ABNORMAL LOW (ref >60)
Glucose, Bld: 97 mg/dL (ref 70–99)
Potassium: 4.1 mmol/L (ref 3.5–5.1)
Sodium: 137 mmol/L (ref 135–145)
Total Bilirubin: 1.1 mg/dL (ref 0.3–1.2)
Total Protein: 7.6 g/dL (ref 6.5–8.1)

## 2021-07-27 LAB — RAPID URINE DRUG SCREEN, HOSP PERFORMED
Amphetamines: NOT DETECTED
Barbiturates: NOT DETECTED
Benzodiazepines: POSITIVE — AB
Cocaine: POSITIVE — AB
Opiates: NOT DETECTED
Tetrahydrocannabinol: NOT DETECTED

## 2021-07-27 LAB — CBC WITH DIFFERENTIAL/PLATELET
Abs Immature Granulocytes: 0.01 10*3/uL (ref 0.00–0.07)
Basophils Absolute: 0 10*3/uL (ref 0.0–0.1)
Basophils Relative: 0 %
Eosinophils Absolute: 0.2 10*3/uL (ref 0.0–0.5)
Eosinophils Relative: 4 %
HCT: 41.3 % (ref 39.0–52.0)
Hemoglobin: 13.6 g/dL (ref 13.0–17.0)
Immature Granulocytes: 0 %
Lymphocytes Relative: 27 %
Lymphs Abs: 1.4 10*3/uL (ref 0.7–4.0)
MCH: 29.6 pg (ref 26.0–34.0)
MCHC: 32.9 g/dL (ref 30.0–36.0)
MCV: 89.8 fL (ref 80.0–100.0)
Monocytes Absolute: 0.4 10*3/uL (ref 0.1–1.0)
Monocytes Relative: 8 %
Neutro Abs: 3.3 10*3/uL (ref 1.7–7.7)
Neutrophils Relative %: 61 %
Platelets: 115 10*3/uL — ABNORMAL LOW (ref 150–400)
RBC: 4.6 MIL/uL (ref 4.22–5.81)
RDW: 13.4 % (ref 11.5–15.5)
WBC: 5.4 10*3/uL (ref 4.0–10.5)
nRBC: 0 % (ref 0.0–0.2)

## 2021-07-27 LAB — TROPONIN I (HIGH SENSITIVITY)
Troponin I (High Sensitivity): 14 ng/L (ref <18)
Troponin I (High Sensitivity): 13 ng/L (ref <18)

## 2021-07-27 LAB — LIPASE, BLOOD: Lipase: 40 U/L (ref 11–51)

## 2021-07-27 LAB — I-STAT CHEM 8, ED
BUN: 23 mg/dL (ref 8–23)
Calcium, Ion: 1.25 mmol/L (ref 1.15–1.40)
Chloride: 102 mmol/L (ref 98–111)
Creatinine, Ser: 1.2 mg/dL (ref 0.61–1.24)
Glucose, Bld: 94 mg/dL (ref 70–99)
HCT: 42 % (ref 39.0–52.0)
Hemoglobin: 14.3 g/dL (ref 13.0–17.0)
Potassium: 4.1 mmol/L (ref 3.5–5.1)
Sodium: 139 mmol/L (ref 135–145)
TCO2: 25 mmol/L (ref 22–32)

## 2021-07-27 LAB — MAGNESIUM: Magnesium: 2 mg/dL (ref 1.7–2.4)

## 2021-07-27 LAB — MRSA NEXT GEN BY PCR, NASAL: MRSA by PCR Next Gen: NOT DETECTED

## 2021-07-27 LAB — PROTIME-INR
INR: 1.1 (ref 0.8–1.2)
Prothrombin Time: 14.4 seconds (ref 11.4–15.2)

## 2021-07-27 MED ORDER — LORAZEPAM 2 MG/ML IJ SOLN: 1.0000 mg | INTRAMUSCULAR | Status: DC | PRN

## 2021-07-27 MED ORDER — DOCUSATE SODIUM 100 MG PO CAPS
100.0000 mg | ORAL_CAPSULE | Freq: Two times a day (BID) | ORAL | Status: DC | PRN
Start: 2021-07-27 — End: 2021-08-11
  Administered 2021-07-28 – 2021-08-04 (×2): 100 mg via ORAL
  Filled 2021-07-27 (×2): qty 1

## 2021-07-27 MED ORDER — NITROGLYCERIN IN D5W 200-5 MCG/ML-% IV SOLN
0.0000 ug/min | INTRAVENOUS | Status: DC
  Administered 2021-07-27: 50 ug/min via INTRAVENOUS
  Filled 2021-07-27: qty 250

## 2021-07-27 MED ORDER — DILTIAZEM HCL-DEXTROSE 125-5 MG/125ML-% IV SOLN (PREMIX)
5.0000 mg/h | INTRAVENOUS | Status: DC
  Administered 2021-07-27: 5 mg/h via INTRAVENOUS
  Filled 2021-07-27: qty 125

## 2021-07-27 MED ORDER — LORAZEPAM 1 MG PO TABS: 1.0000 mg | ORAL_TABLET | ORAL | Status: DC | PRN

## 2021-07-27 MED ORDER — SODIUM CHLORIDE 0.9 % IV SOLN: INTRAVENOUS | Status: DC | PRN

## 2021-07-27 MED ORDER — NITROPRUSSIDE SODIUM-NACL 20-0.9 MG/100ML-% IV SOLN: 0.0000 ug/kg/min | INTRAVENOUS | Status: DC

## 2021-07-27 MED ORDER — FENTANYL CITRATE PF 50 MCG/ML IJ SOSY
50.0000 ug | PREFILLED_SYRINGE | Freq: Once | INTRAMUSCULAR | Status: AC
  Administered 2021-07-27: 50 ug via INTRAVENOUS
  Filled 2021-07-27: qty 1

## 2021-07-27 MED ORDER — IOHEXOL 350 MG/ML SOLN
100.0000 mL | Freq: Once | INTRAVENOUS | Status: AC | PRN
Start: 2021-07-27 — End: 2021-07-27
  Administered 2021-07-27: 100 mL via INTRAVENOUS

## 2021-07-27 MED ORDER — ENOXAPARIN SODIUM 40 MG/0.4ML IJ SOSY: 40.0000 mg | PREFILLED_SYRINGE | INTRAMUSCULAR | Status: DC

## 2021-07-27 MED ORDER — ONDANSETRON HCL 4 MG/2ML IJ SOLN
4.0000 mg | Freq: Once | INTRAMUSCULAR | Status: AC
  Administered 2021-07-27: 4 mg via INTRAVENOUS
  Filled 2021-07-27: qty 2

## 2021-07-27 MED ORDER — FOLIC ACID 1 MG PO TABS
1.0000 mg | ORAL_TABLET | Freq: Every day | ORAL | Status: DC
  Administered 2021-07-28 – 2021-08-11 (×14): 1 mg via ORAL
  Filled 2021-07-27 (×14): qty 1

## 2021-07-27 MED ORDER — THIAMINE HCL 100 MG/ML IJ SOLN
100.0000 mg | Freq: Every day | INTRAMUSCULAR | Status: DC
  Administered 2021-08-02 – 2021-08-04 (×2): 100 mg via INTRAVENOUS
  Filled 2021-07-27 (×3): qty 2

## 2021-07-27 MED ORDER — LABETALOL HCL 5 MG/ML IV SOLN
0.5000 mg/min | Status: DC
  Administered 2021-07-27: 0.5 mg/min via INTRAVENOUS
  Administered 2021-07-27: 2.5 mg/min via INTRAVENOUS
  Administered 2021-07-28: 0.5 mg/min via INTRAVENOUS
  Filled 2021-07-27 (×4): qty 80

## 2021-07-27 MED ORDER — FENTANYL CITRATE PF 50 MCG/ML IJ SOSY: 25.0000 ug | PREFILLED_SYRINGE | INTRAMUSCULAR | Status: DC | PRN

## 2021-07-27 MED ORDER — LABETALOL HCL 5 MG/ML IV SOLN
0.5000 mg/min | Status: DC
  Filled 2021-07-27: qty 80

## 2021-07-27 MED ORDER — DIAZEPAM 5 MG/ML IJ SOLN: 2.5000 mg | Freq: Four times a day (QID) | INTRAMUSCULAR | Status: DC | PRN

## 2021-07-27 MED ORDER — HYDRALAZINE HCL 20 MG/ML IJ SOLN: 10.0000 mg | Freq: Once | INTRAMUSCULAR | Status: DC

## 2021-07-27 MED ORDER — POLYETHYLENE GLYCOL 3350 17 G PO PACK: 17.0000 g | PACK | Freq: Every day | ORAL | Status: DC | PRN

## 2021-07-27 MED ORDER — THIAMINE HCL 100 MG PO TABS
100.0000 mg | ORAL_TABLET | Freq: Every day | ORAL | Status: DC
  Administered 2021-07-28 – 2021-08-11 (×13): 100 mg via ORAL
  Filled 2021-07-27 (×14): qty 1

## 2021-07-27 MED ORDER — LABETALOL HCL 5 MG/ML IV SOLN
10.0000 mg | Freq: Once | INTRAVENOUS | Status: AC
  Administered 2021-07-27: 10 mg via INTRAVENOUS

## 2021-07-27 MED ORDER — ADULT MULTIVITAMIN W/MINERALS CH
1.0000 | ORAL_TABLET | Freq: Every day | ORAL | Status: DC
  Administered 2021-07-28 – 2021-08-11 (×14): 1 via ORAL
  Filled 2021-07-27 (×14): qty 1

## 2021-07-27 MED ORDER — NICARDIPINE HCL IN NACL 20-0.86 MG/200ML-% IV SOLN
3.0000 mg/h | INTRAVENOUS | Status: DC
  Administered 2021-07-27 (×2): 5 mg/h via INTRAVENOUS
  Filled 2021-07-27: qty 200

## 2021-07-27 MED ORDER — DIAZEPAM 5 MG/ML IJ SOLN
5.0000 mg | Freq: Once | INTRAMUSCULAR | Status: AC
  Administered 2021-07-27: 5 mg via INTRAVENOUS
  Filled 2021-07-27: qty 2

## 2021-07-27 MED ORDER — NITROGLYCERIN 2 % TD OINT
1.0000 [in_us] | TOPICAL_OINTMENT | Freq: Once | TRANSDERMAL | Status: AC
  Administered 2021-07-27: 1 [in_us] via TOPICAL
  Filled 2021-07-27: qty 1

## 2021-07-27 MED ORDER — CHLORHEXIDINE GLUCONATE CLOTH 2 % EX PADS
6.0000 | MEDICATED_PAD | Freq: Every day | CUTANEOUS | Status: DC
  Administered 2021-07-28 – 2021-08-06 (×8): 6 via TOPICAL

## 2021-07-27 NOTE — ED Notes (Signed)
Patient is resting comfortably. 

## 2021-07-27 NOTE — ED Triage Notes (Signed)
Pt BIB EMS due to chest pain after crack cocaine use. Pt is a chronic drug user and was doing cocaine all night with his friends. Pt lasted used cocaine at 0300 this morning. Pt called out for chest pain. Pt axox4. VSS ? ?Pt got 348mf of aspirin, 1 nitro, 4mg  of zofran, and 0.25 of versed.  ?

## 2021-07-27 NOTE — ED Notes (Signed)
RN cleaned pt. New chucks and bed sheet applied.  ?

## 2021-07-27 NOTE — ED Provider Notes (Signed)
?[facility] ?Provider Note ? ? ?CSN: [id number] ?Arrival date & time: 07/27/21  4098 ? ?  ? ?History ? ?Chief Complaint  ?Patient presents with  ? Chest Pain  ? ? ?Edward Hopkins is a 71 y.o. male. ? ? ?Chest Pain ?Patient presents for chest pain.  Onset was around midnight last night.  Patient reports that he was up most of the night.  He endorses cocaine use up until 3 AM.  He also endorses alcohol use.  This morning, EMS was called due to persistent chest pain.  Prior to arrival, she received 324 of ASA, 1 nitro, 4 mg Zofran, and 0.25 mg of Versed by EMS.  He states that none of these medications helped his pain.  He describes his pain as throughout his anterior chest, greater on the right side.  It does not radiate to his back, shoulders, or neck.  It is currently 10/10 in severity.  Per chart review, medical history includes CHF, DVT, PE, intramural aortic hematoma s/p ascending aorta replacement, osteomyelitis.  It does not appear the patient is currently on a blood thinner. ?  ? ?Home Medications ?Prior to Admission medications   ?Medication Sig Start Date End Date Taking? Authorizing Provider  ?amLODipine (NORVASC) 10 MG tablet Take 1 tablet (10 mg total) by mouth daily. 09/03/14  Yes Barrett, Erin R, PA-C  ?aspirin EC 325 MG EC tablet Take 1 tablet (325 mg total) by mouth daily. 09/03/14  Yes Barrett, Erin R, PA-C  ?oxyCODONE (ROXICODONE) 15 MG immediate release tablet Take 15 mg by mouth 5 (five) times daily as needed for pain. 07/22/21  Yes [provider]  ?buprenorphine (BUTRANS) 5 MCG/HR [place] 1 patch onto the skin once a week. ?Patient not taking: Reported on 07/28/2021 07/22/21   [provider]  ?metoprolol tartrate (LOPRESSOR) 25 MG tablet Take 1 tablet (25 mg total) by mouth 2 (two) times daily. 09/03/14   Barrett, Lodema Hong, PA-C  ?   ? ?Allergies    ?Patient has no known allergies.   ? ?Review of Systems   ?Review of Systems  ?Cardiovascular:   Positive for chest pain.  ?All other systems reviewed and are negative. ? ?Physical Exam ?Updated Vital Signs ?BP 123/78   Pulse 73   Temp 98.1 ?F (36.7 ?C) (Oral)   Resp (!) 21   Ht 5\' 8"  (1.727 m)   Wt 89 kg   SpO2 95%   BMI 29.83 kg/m?  ?Physical Exam ?Vitals and nursing note reviewed.  ?Constitutional:   ?   General: He is not in acute distress. ?   Appearance: He is well-developed and normal weight. He is ill-appearing. He is not toxic-appearing or diaphoretic.  ?HENT:  ?   Head: Normocephalic and atraumatic.  ?Eyes:  ?   Conjunctiva/sclera: Conjunctivae normal.  ?Cardiovascular:  ?   Rate and Rhythm: Normal rate and regular rhythm.  ?   Heart sounds: No murmur heard. ?Pulmonary:  ?   Effort: Pulmonary effort is normal. No tachypnea or respiratory distress.  ?   Breath sounds: Normal breath sounds. No decreased breath sounds, wheezing, rhonchi or rales.  ?Chest:  ?   Chest wall: No tenderness.  ?Abdominal:  ?   Palpations: Abdomen is soft.  ?   Tenderness: There is no abdominal tenderness.  ?Musculoskeletal:     ?   General: No swelling.  ?   Cervical back: Normal range of motion and neck supple.  ?  Right lower leg: No edema.  ?   Left lower leg: No edema.  ?Skin: ?   General: Skin is warm and dry.  ?   Capillary Refill: Capillary refill takes less than 2 seconds.  ?   Coloration: Skin is not cyanotic or pale.  ?Neurological:  ?   General: No focal deficit present.  ?   Mental Status: He is alert and oriented to person, place, and time.  ?   Cranial Nerves: No cranial nerve deficit.  ?   Motor: No weakness.  ?Psychiatric:     ?   Mood and Affect: Mood normal. Affect is blunt.     ?   Speech: Speech normal.     ?   Behavior: Behavior is uncooperative and withdrawn.  ? ? ?ED Results / Procedures / Treatments   ?Labs ?(all labs ordered are listed, but only abnormal results are displayed) ?Labs Reviewed  ?COMPREHENSIVE METABOLIC PANEL - Abnormal; Notable for the following components:  ?    Result Value  ?  Creatinine, Ser 1.27 (*)   ? GFR, Estimated 60 (*)   ? All other components within normal limits  ?RAPID URINE DRUG SCREEN, HOSP PERFORMED - Abnormal; Notable for the following components:  ? Cocaine POSITIVE (*)   ? Benzodiazepines POSITIVE (*)   ? All other components within normal limits  ?CBC WITH DIFFERENTIAL/PLATELET - Abnormal; Notable for the following components:  ? Platelets 115 (*)   ? All other components within normal limits  ?URINALYSIS, ROUTINE W REFLEX MICROSCOPIC - Abnormal; Notable for the following components:  ? Ketones, ur 5 (*)   ? Protein, ur 30 (*)   ? Bacteria, UA RARE (*)   ? All other components within normal limits  ?BASIC METABOLIC PANEL - Abnormal; Notable for the following components:  ? Glucose, Bld 100 (*)   ? Creatinine, Ser 1.55 (*)   ? Calcium 8.8 (*)   ? GFR, Estimated 47 (*)   ? All other components within normal limits  ?CBC - Abnormal; Notable for the following components:  ? RBC 3.70 (*)   ? Hemoglobin 10.8 (*)   ? HCT 32.9 (*)   ? Platelets 102 (*)   ? All other components within normal limits  ?HEPATIC FUNCTION PANEL - Abnormal; Notable for the following components:  ? Total Protein 6.4 (*)   ? Albumin 2.9 (*)   ? AST 14 (*)   ? All other components within normal limits  ?BASIC METABOLIC PANEL - Abnormal; Notable for the following components:  ? Glucose, Bld 132 (*)   ? BUN 32 (*)   ? Creatinine, Ser 1.95 (*)   ? Calcium 8.8 (*)   ? GFR, Estimated 36 (*)   ? All other components within normal limits  ?CBC - Abnormal; Notable for the following components:  ? Hemoglobin 12.8 (*)   ? HCT 38.2 (*)   ? Platelets 102 (*)   ? All other components within normal limits  ?HEPATIC FUNCTION PANEL - Abnormal; Notable for the following components:  ? Albumin 2.8 (*)   ? AST 66 (*)   ? All other components within normal limits  ?MRSA NEXT GEN BY PCR, NASAL  ?MAGNESIUM  ?LIPASE, BLOOD  ?PROTIME-INR  ?HCV RNA QUANT  ?I-STAT CHEM 8, ED  ?TROPONIN I (HIGH SENSITIVITY)  ?TROPONIN I (HIGH  SENSITIVITY)  ? ? ?EKG ?EKG Interpretation ? ?Date/Time:  Wednesday July 27 2021 10:34:10 EDT ?Ventricular Rate:  65 ?PR Interval:  174 ?  QRS Duration: 151 ?QT Interval:  405 ?QTC Calculation: 422 ?R Axis:   -38 ?Text Interpretation: Sinus rhythm Probable left atrial enlargement RBBB and LAFB Left ventricular hypertrophy Anterolateral infarct, age indeterminate Confirmed by Godfrey Pick [phone]) on 07/27/2021 11:18:36 AM ? ?Radiology ?ECHOCARDIOGRAM COMPLETE ? ?Result Date: 07/28/2021 ?   ECHOCARDIOGRAM REPORT   Patient Name:   Edward Hopkins Date of Exam: 07/28/2021 Medical Rec #:  [id number]     Height:       68.0 in Accession #:    [id number]    Weight:       200.0 lb Date of Birth:  1951-05-07     BSA:          2.044 m? Patient Age:    15 years      BP:           91/63 mmHg Patient Gender: M             HR:           63 bpm. Exam Location:  Inpatient Procedure: 2D Echo, Cardiac Doppler and Color Doppler Indications:    Thoracic aorta dissection  History:        Patient has no prior history of Echocardiogram examinations.                 Ascending aortic replacement.  Sonographer:    Jyl Heinz Referring Phys: [id number] Hortencia Conradi MEIER IMPRESSIONS  1. Left ventricular ejection fraction, by estimation, is 60 to 65%. The left ventricle has normal function. The left ventricle has no regional wall motion abnormalities. There is moderate asymmetric left ventricular hypertrophy. Left ventricular diastolic parameters were normal.  2. Right ventricular systolic function is normal. The right ventricular size is normal.  3. The mitral valve is normal in structure. Trivial mitral valve regurgitation. No evidence of mitral stenosis.  4. The aortic valve is tricuspid. Aortic valve regurgitation is trivial. No aortic stenosis is present. FINDINGS  Left Ventricle: Left ventricular ejection fraction, by estimation, is 60 to 65%. The left ventricle has normal function. The left ventricle has no regional wall motion abnormalities. The  left ventricular internal cavity size was normal in size. There is  moderate asymmetric left ventricular hypertrophy. Left ventricular diastolic parameters were normal. Right Ventricle: The right ventricular size is

## 2021-07-27 NOTE — ED Provider Notes (Signed)
Seen after prior EDP. ? 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 ? ?Patient with reported recent cocaine use. ? ?Cardizem drip initiated for heart rate control. ? ?Push dose labetalol initiated for control of BP. ? ?Dr. Kipp Brood with cardiothoracic service is aware of case.  Recommends medical management. ? ?Critical care is aware of case and will evaluate for admission. ? ?CRITICAL CARE ?Performed by: Valarie Merino ? ? ?Total critical care time: 45 minutes ? ?Critical care time was exclusive of separately billable procedures and treating other patients. ? ?Critical care was necessary to treat or prevent imminent or life-threatening deterioration. ? ?Critical care was time spent personally by me on the following activities: development of treatment plan with patient and/or surrogate as well as nursing, discussions with consultants, evaluation of patient's response to treatment, examination of patient, obtaining history from patient or surrogate, ordering and performing treatments and interventions, ordering and review of laboratory studies, ordering and review of radiographic studies, pulse oximetry and re-evaluation of patient's condition. ? ?  ?Valarie Merino, MD ?07/27/21 1739 ? ?

## 2021-07-27 NOTE — ED Notes (Signed)
RN placed pt on 2L of O 2 ?
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
## 2021-07-27 NOTE — Procedures (Signed)
Arterial Catheter Insertion Procedure Note ? ?Edward Hopkins  ?[id number]  ?Jul 14, 1950 ? ?Date:07/27/21  ?Time:7:03 PM  ? ? ?Provider Performing: Bayard Beaver  ? ? ?Procedure: Insertion of Arterial Line [phone]) without US guidance ? ?Indication(s) ?Blood pressure monitoring and/or need for frequent ABGs ? ?Consent ?Unable to obtain consent due to emergent nature of procedure. ? ?Anesthesia ?None ? ? ?Time Out ?Verified patient identification, verified procedure, site/side was marked, verified correct patient position, special equipment/implants available, medications/allergies/relevant history reviewed, required imaging and test results available. ? ? ?Sterile Technique ?Maximal sterile technique including full sterile barrier drape, hand hygiene, sterile gown, sterile gloves, mask, hair covering, sterile ultrasound probe cover (if used). ? ? ?Procedure Description ?Area of catheter insertion was cleaned with chlorhexidine and draped in sterile fashion. Without real-time ultrasound guidance an arterial catheter was placed into the right radial artery.  Appropriate arterial tracings confirmed on monitor.   ? ? ?Complications/Tolerance ?None; patient tolerated the procedure well. ? ? ?EBL ?Minimal ? ? ?Specimen(s) ?None ? ?

## 2021-07-28 ENCOUNTER — Encounter (HOSPITAL_COMMUNITY): Payer: Self-pay | Admitting: Student

## 2021-07-28 ENCOUNTER — Inpatient Hospital Stay (HOSPITAL_COMMUNITY): Payer: Medicare Other

## 2021-07-28 DIAGNOSIS — Z9114 Patient's other noncompliance with medication regimen: Secondary | ICD-10-CM

## 2021-07-28 DIAGNOSIS — I712 Thoracic aortic aneurysm, without rupture, unspecified: Secondary | ICD-10-CM

## 2021-07-28 DIAGNOSIS — I161 Hypertensive emergency: Secondary | ICD-10-CM

## 2021-07-28 DIAGNOSIS — I7100 Dissection of unspecified site of aorta: Secondary | ICD-10-CM | POA: Diagnosis not present

## 2021-07-28 DIAGNOSIS — N179 Acute kidney failure, unspecified: Secondary | ICD-10-CM | POA: Diagnosis not present

## 2021-07-28 LAB — ECHOCARDIOGRAM COMPLETE
AR max vel: 3.48 cm2
AV Peak grad: 5.7 mmHg
Ao pk vel: 1.19 m/s
Area-P 1/2: 3.12 cm2
Calc EF: 63.1 %
Height: 68 in
P 1/2 time: 518 msec
S' Lateral: 2.7 cm
Single Plane A2C EF: 62.2 %
Single Plane A4C EF: 63.5 %
Weight: 3199.32 oz

## 2021-07-28 LAB — HEPATIC FUNCTION PANEL
ALT: 11 U/L (ref 0–44)
AST: 14 U/L — ABNORMAL LOW (ref 15–41)
Albumin: 2.9 g/dL — ABNORMAL LOW (ref 3.5–5.0)
Alkaline Phosphatase: 54 U/L (ref 38–126)
Bilirubin, Direct: 0.2 mg/dL (ref 0.0–0.2)
Indirect Bilirubin: 0.8 mg/dL (ref 0.3–0.9)
Total Bilirubin: 1 mg/dL (ref 0.3–1.2)
Total Protein: 6.4 g/dL — ABNORMAL LOW (ref 6.5–8.1)

## 2021-07-28 LAB — CBC
HCT: 32.9 % — ABNORMAL LOW (ref 39.0–52.0)
Hemoglobin: 10.8 g/dL — ABNORMAL LOW (ref 13.0–17.0)
MCH: 29.2 pg (ref 26.0–34.0)
MCHC: 32.8 g/dL (ref 30.0–36.0)
MCV: 88.9 fL (ref 80.0–100.0)
Platelets: 102 10*3/uL — ABNORMAL LOW (ref 150–400)
RBC: 3.7 MIL/uL — ABNORMAL LOW (ref 4.22–5.81)
RDW: 13.4 % (ref 11.5–15.5)
WBC: 6.2 10*3/uL (ref 4.0–10.5)
nRBC: 0 % (ref 0.0–0.2)

## 2021-07-28 LAB — BASIC METABOLIC PANEL
Anion gap: 8 (ref 5–15)
BUN: 22 mg/dL (ref 8–23)
CO2: 24 mmol/L (ref 22–32)
Calcium: 8.8 mg/dL — ABNORMAL LOW (ref 8.9–10.3)
Chloride: 103 mmol/L (ref 98–111)
Creatinine, Ser: 1.55 mg/dL — ABNORMAL HIGH (ref 0.61–1.24)
GFR, Estimated: 47 mL/min — ABNORMAL LOW (ref >60)
Glucose, Bld: 100 mg/dL — ABNORMAL HIGH (ref 70–99)
Potassium: 4.2 mmol/L (ref 3.5–5.1)
Sodium: 135 mmol/L (ref 135–145)

## 2021-07-28 MED ORDER — ACETAMINOPHEN 325 MG PO TABS
650.0000 mg | ORAL_TABLET | Freq: Four times a day (QID) | ORAL | Status: DC | PRN
  Administered 2021-07-28 – 2021-07-29 (×2): 650 mg via ORAL
  Filled 2021-07-28 (×2): qty 2

## 2021-07-28 MED ORDER — CARVEDILOL 3.125 MG PO TABS
3.1250 mg | ORAL_TABLET | Freq: Two times a day (BID) | ORAL | Status: DC
  Administered 2021-07-28: 3.125 mg via ORAL
  Filled 2021-07-28 (×2): qty 1

## 2021-07-28 MED ORDER — OXYCODONE HCL 5 MG PO TABS
5.0000 mg | ORAL_TABLET | Freq: Four times a day (QID) | ORAL | Status: DC | PRN
  Administered 2021-07-28: 10 mg via ORAL
  Administered 2021-07-28 (×2): 5 mg via ORAL
  Administered 2021-07-29 (×2): 10 mg via ORAL
  Filled 2021-07-28: qty 1
  Filled 2021-07-28 (×3): qty 2
  Filled 2021-07-28: qty 1

## 2021-07-28 MED ORDER — LABETALOL HCL 5 MG/ML IV SOLN
5.0000 mg | INTRAVENOUS | Status: DC | PRN
  Administered 2021-07-28 – 2021-07-29 (×2): 5 mg via INTRAVENOUS
  Filled 2021-07-28: qty 4

## 2021-07-28 MED ORDER — LABETALOL HCL 5 MG/ML IV SOLN: 5.0000 mg | INTRAVENOUS | Status: DC | PRN

## 2021-07-28 NOTE — Progress Notes (Signed)
CSW received consult for patient for substance use resources for patient. CSW spoke with patient at bedside. CSW offered patient outpatient substance use treatment services resources. Patient accepted. Patient requested transportation resources from [place]. CSW provided patient with transportation resources. Patient accepted. All questions answered. No further questions reported at this time. ?

## 2021-07-28 NOTE — TOC Initial Note (Signed)
Transition of Care (TOC) - Initial/Assessment Note  ? ? ?Patient Details  ?Name: Edward Hopkins ?MRN: [id number] ?Date of Birth: 15-Jun-1950 ? ?Transition of Care [facility]) CM/SW Contact:    ?Ninfa Meeker, RN ?Phone Number: ?07/28/2021, 10:50 AM ? ?Clinical Narrative:     ?Transition of Care Screening Note: ? ?Transition of Care [facility]) Department has reviewed patient and no TOC needs have been identified at this time. We will continue to monitor patient advancement through Interdisciplinary progressions and if new patient needs arise, please place a consult.   ? ? ?  ?  ? ? ?Patient Goals and CMS Choice ?  ?  ?  ? ?Expected Discharge Plan and Services ?  ?  ?  ?  ?  ?                ?  ?  ?  ?  ?  ?  ?  ?  ?  ?  ? ?Prior Living Arrangements/Services ?  ?  ?  ?       ?  ?  ?  ?  ? ?Activities of Daily Living ?  ?  ? ?Permission Sought/Granted ?  ?  ?   ?   ?   ?   ? ?Emotional Assessment ?  ?  ?  ?  ?  ?  ? ?Admission diagnosis:  Cocaine abuse ([place]) [F14.10] ?Aortic aneurysm with dissection ([place]) [I71.00, I71.9] ?Dissection of aorta, unspecified portion of aorta (HCC) [I71.00] ?Hypertension, unspecified type [I10] ?Patient Active Problem List  ? Diagnosis Date Noted  ? Aortic aneurysm with dissection ([place]) 07/27/2021  ? Hypercoagulable state ([place]) 10/21/2015  ? Pulmonary embolism ([place]) 10/21/2015  ? S/P ascending aortic replacement 08/27/2014  ? AKI (acute kidney injury) ([place]) 08/06/2014  ? Malnutrition of moderate degree ([place]) 08/05/2014  ? Orthostatic hypotension 08/04/2014  ? Hypotension 08/04/2014  ? Femoral DVT (deep venous thrombosis) (H[facility])   ? Intramural aortic hematoma ([place]) 07/17/2014  ? ?PCP:  Pcp, No ?Pharmacy:   ?[id number] [address], [place] ?[address] ?[place] [address] ?Phone: [phone] Fax: [phone] ? ? ? ? ?Social Determinants of Health (SDOH) Interventions ?  ? ?Readmission Risk Interventions ?No flowsheet data found. ? ? ?

## 2021-07-28 NOTE — Progress Notes (Signed)
? ?NAME:  Edward Hopkins, MRN:  [id number], DOB:  [birth year], LOS: 1 ?ADMISSION DATE:  07/27/2021, CONSULTATION DATE:  07/27/21 ?REFERRING MD:  Francia Greaves - EM, CHIEF COMPLAINT:  Aortic Dissection   ? ?History of Present Illness:  ?71 yo M PMH HTN, Hep C, polysubstance abuse including ongoing cocaine use, Aortic dissection s/p thoracic aortic graft repair, thoracic aortic aneurysm presented to [facility] 3/15 with CC chest pain and back pain. Chest pain started 3/14 and progressed. Pt states it feels like something is squishing his heart and his back hurts. CTA c/a/p acquired and revealed recurrent type B dissection with flap beginning distal to takeoff of L subclavian artery and extending to takeoff of SMA.  ? ?Case was discussed with CVTS who recommended medical management.  PCCM consulted for admission in this setting  ? ?Pertinent  Medical History  ?HTN ?Hep C  ?Aortic dissection  ?Aortic aneurysm  ?DVT ? ?Significant Hospital Events: ?Including procedures, antibiotic start and stop dates in addition to other pertinent events   ?3/15 Ed for chest and back pain. Found to have recurrent type B dissection. Admit to PCCM  ? ?Interim History / Subjective:  ?Today he complains of pain in his chest only when taking a deep breath, but overall feels improved. He is hungry. ? ?Objective   ?Blood pressure 91/67, pulse (!) 55, temperature 97.8 ?F (36.6 ?C), temperature source Axillary, resp. rate 15, height 5\' 8"  (1.727 m), weight 90.7 kg, SpO2 97 %. ?   ?   ? ?Intake/Output Summary (Last 24 hours) at 07/28/2021 0727 ?Last data filed at 07/28/2021 0100 ?Gross per 24 hour  ?Intake 454.25 ml  ?Output 400 ml  ?Net 54.25 ml  ? ? ?Filed Weights  ? 07/27/21 0955 07/28/21 0500  ?Weight: 90.7 kg 90.7 kg  ? ? ?Examination: ?General: elderly man sitting up in bed in NAD ?HENT: [place]/AT, eyes anicteric ?Lungs:  CTAB, breathing comfortably on RA ?Cardiovascular: S1S2, RRR ?Abdomen: soft, NT ?Extremities: no peripheral edema, symmetric DP & PT  pulses ?Neuro: awake and alert, moving all extremities. Answering questions appropriately. ?Derm: warm, dry, no diffuse rashes. ? ?BUN 22  ?Cr 1.55 ?WBC 6.2 ?H/H 10.8/32.9 ?Platelets 102 ?UDS: cocaine, benzos ? ?Resolved Hospital Problem list   ? ? ?Assessment & Plan:  ? ?Recurrent type B aortic dissection due to hypertensive emergency- L subclavian artery to level of SMA takeoff. Fenestration of dissection flap mid descending thoracic aorta ?S/p graft replacement of ascending thoracic aorta (2016)  ?-Medical management; appreciate TCTS management ?-counseled on the importance of quitting cocaine and avoiding things that cause sudden worsening of hypertension ?-counseled on the importance of compliance with anti-hypertensives ?-start coreg today- prefer this to metoprolol due to alpha blockade component in addition to Bblocker ?-planning on starting amlodipine tomorrow ?-off infusions for now; if he remains off can transfer to floor later ?-goal HR <60, SBP <120 ?-echo pending ?-pain control- tylenol, oxycodone ? ?HTN; not compliant with home meds ?-coreg-- prefer to metoprolol due to alpha blockage with his cocaine history ? ?AKI, likely from acute drop in BP ?-monitor ?-strict I/Os ?-renally dose meds, avoid nephrotoxic meds ? ?Cocaine abuse ?-counseled on the importance of quitting ?-needs TOC consult-- previously placed ? ?Bilateral inguinal hernias (L>R) ?-needs outpatient follow up with surgery ? ?H/o HCV, completed antiviral therapy, unknown if SVR ?-recheck HCV RNA since he never followed up after completing treatment ? ?Needs to be established with PCP at discharge. ? ?Best Practice (right click and "Reselect all SmartList Selections"  daily)  ? ?Diet/type: Regular consistency (see orders) ?DVT prophylaxis: SCD ?GI prophylaxis: N/A ?Lines: Arterial Line ?Foley:  N/A ?Code Status:  full code ?Last date of multidisciplinary goals of care discussion [d/w pt 07/27/21] ? ?Labs   ?CBC: ?Recent Labs  ?Lab  07/27/21 ?1032 07/27/21 ?1055 07/28/21 ?0336  ?WBC 5.4  --  6.2  ?NEUTROABS 3.3  --   --   ?HGB 13.6 14.3 10.8*  ?HCT 41.3 42.0 32.9*  ?MCV 89.8  --  88.9  ?PLT 115*  --  102*  ? ? ? ?Basic Metabolic Panel: ?Recent Labs  ?Lab 07/27/21 ?1032 07/27/21 ?1055 07/28/21 ?0336  ?NA 137 139 135  ?K 4.1 4.1 4.2  ?CL 101 102 103  ?CO2 24  --  24  ?GLUCOSE 97 94 100*  ?BUN 21 23 22   ?CREATININE 1.27* 1.20 1.55*  ?CALCIUM 9.4  --  8.8*  ?MG 2.0  --   --   ? ? ?GFR: ?Estimated Creatinine Clearance: 46.4 mL/min (A) (by C-G formula based on SCr of 1.55 mg/dL (H)). ?Recent Labs  ?Lab 07/27/21 ?1032 07/28/21 ?0336  ?WBC 5.4 6.2  ? ? ? ?Liver Function Tests: ?Recent Labs  ?Lab 07/27/21 ?1032 07/28/21 ?0336  ?AST 19 14*  ?ALT 14 11  ?ALKPHOS 62 54  ?BILITOT 1.1 1.0  ?PROT 7.6 6.4*  ?ALBUMIN 3.5 2.9*  ? ? ?Recent Labs  ?Lab 07/27/21 ?1032  ?LIPASE 40  ? ? ?No results for input(s): AMMONIA in the last 168 hours. ? ?ABG ?   ?Component Value Date/Time  ? PHART 7.379 08/28/2014 0209  ? PCO2ART 41.2 08/28/2014 0209  ? PO2ART 92.0 08/28/2014 0209  ? HCO3 24.3 (H) 08/28/2014 0209  ? TCO2 25 07/27/2021 1055  ? ACIDBASEDEF 1.0 08/28/2014 0209  ? O2SAT 97.0 08/28/2014 0209  ? ?  ? ? ?Julian Hy, DO 07/28/21 10:12 AM ?[facility] ? ? ?

## 2021-07-29 DIAGNOSIS — I161 Hypertensive emergency: Secondary | ICD-10-CM | POA: Diagnosis not present

## 2021-07-29 DIAGNOSIS — I7100 Dissection of unspecified site of aorta: Secondary | ICD-10-CM | POA: Diagnosis not present

## 2021-07-29 DIAGNOSIS — I719 Aortic aneurysm of unspecified site, without rupture: Secondary | ICD-10-CM | POA: Diagnosis not present

## 2021-07-29 LAB — BASIC METABOLIC PANEL
Anion gap: 9 (ref 5–15)
BUN: 32 mg/dL — ABNORMAL HIGH (ref 8–23)
CO2: 22 mmol/L (ref 22–32)
Calcium: 8.8 mg/dL — ABNORMAL LOW (ref 8.9–10.3)
Chloride: 105 mmol/L (ref 98–111)
Creatinine, Ser: 1.95 mg/dL — ABNORMAL HIGH (ref 0.61–1.24)
GFR, Estimated: 36 mL/min — ABNORMAL LOW (ref >60)
Glucose, Bld: 132 mg/dL — ABNORMAL HIGH (ref 70–99)
Potassium: 4.7 mmol/L (ref 3.5–5.1)
Sodium: 136 mmol/L (ref 135–145)

## 2021-07-29 LAB — HEPATIC FUNCTION PANEL
ALT: 27 U/L (ref 0–44)
AST: 66 U/L — ABNORMAL HIGH (ref 15–41)
Albumin: 2.8 g/dL — ABNORMAL LOW (ref 3.5–5.0)
Alkaline Phosphatase: 56 U/L (ref 38–126)
Bilirubin, Direct: 0.2 mg/dL (ref 0.0–0.2)
Indirect Bilirubin: 0.9 mg/dL (ref 0.3–0.9)
Total Bilirubin: 1.1 mg/dL (ref 0.3–1.2)
Total Protein: 6.7 g/dL (ref 6.5–8.1)

## 2021-07-29 LAB — CBC
HCT: 38.2 % — ABNORMAL LOW (ref 39.0–52.0)
Hemoglobin: 12.8 g/dL — ABNORMAL LOW (ref 13.0–17.0)
MCH: 29.8 pg (ref 26.0–34.0)
MCHC: 33.5 g/dL (ref 30.0–36.0)
MCV: 88.8 fL (ref 80.0–100.0)
Platelets: 102 10*3/uL — ABNORMAL LOW (ref 150–400)
RBC: 4.3 MIL/uL (ref 4.22–5.81)
RDW: 13.3 % (ref 11.5–15.5)
WBC: 9.2 10*3/uL (ref 4.0–10.5)
nRBC: 0 % (ref 0.0–0.2)

## 2021-07-29 MED ORDER — CARVEDILOL 12.5 MG PO TABS: 12.5000 mg | ORAL_TABLET | Freq: Two times a day (BID) | ORAL | Status: DC

## 2021-07-29 MED ORDER — HYDRALAZINE HCL 20 MG/ML IJ SOLN
20.0000 mg | Freq: Four times a day (QID) | INTRAMUSCULAR | Status: DC | PRN
  Administered 2021-07-29 – 2021-07-30 (×3): 20 mg via INTRAVENOUS
  Filled 2021-07-29 (×3): qty 1

## 2021-07-29 MED ORDER — LABETALOL HCL 5 MG/ML IV SOLN
20.0000 mg | INTRAVENOUS | Status: DC | PRN
  Administered 2021-07-29 – 2021-07-30 (×6): 20 mg via INTRAVENOUS
  Filled 2021-07-29 (×7): qty 4

## 2021-07-29 MED ORDER — CARVEDILOL 6.25 MG PO TABS
6.2500 mg | ORAL_TABLET | Freq: Two times a day (BID) | ORAL | Status: DC
  Administered 2021-07-29 – 2021-08-01 (×6): 6.25 mg via ORAL
  Filled 2021-07-29 (×6): qty 1

## 2021-07-29 MED ORDER — LABETALOL HCL 5 MG/ML IV SOLN
10.0000 mg | INTRAVENOUS | Status: DC | PRN
  Administered 2021-07-29 (×2): 10 mg via INTRAVENOUS
  Filled 2021-07-29: qty 4

## 2021-07-29 MED ORDER — AMLODIPINE BESYLATE 10 MG PO TABS
10.0000 mg | ORAL_TABLET | Freq: Every day | ORAL | Status: DC
  Administered 2021-07-29 – 2021-07-30 (×2): 10 mg via ORAL
  Filled 2021-07-29 (×4): qty 1

## 2021-07-29 MED ORDER — CARVEDILOL 6.25 MG PO TABS
6.2500 mg | ORAL_TABLET | Freq: Two times a day (BID) | ORAL | Status: DC
  Administered 2021-07-29: 6.25 mg via ORAL
  Filled 2021-07-29: qty 1

## 2021-07-29 MED ORDER — OXYCODONE HCL 5 MG PO TABS
15.0000 mg | ORAL_TABLET | Freq: Four times a day (QID) | ORAL | Status: DC | PRN
  Administered 2021-07-29 – 2021-08-01 (×6): 15 mg via ORAL
  Filled 2021-07-29 (×7): qty 3

## 2021-07-29 MED ORDER — FENTANYL CITRATE PF 50 MCG/ML IJ SOSY
25.0000 ug | PREFILLED_SYRINGE | INTRAMUSCULAR | Status: DC | PRN
  Administered 2021-07-29: 50 ug via INTRAVENOUS
  Filled 2021-07-29: qty 1

## 2021-07-29 NOTE — Progress Notes (Signed)
? ?NAME:  Edward Hopkins, MRN:  [id number], DOB:  1950-06-24, LOS: 2 ?ADMISSION DATE:  07/27/2021, CONSULTATION DATE:  07/27/21 ?REFERRING MD:  Francia Greaves - EM, CHIEF COMPLAINT:  Aortic Dissection   ? ?History of Present Illness:  ?71 yo M PMH HTN, Hep C, polysubstance abuse including ongoing cocaine use, Aortic dissection s/p thoracic aortic graft repair, thoracic aortic aneurysm presented to [facility] 3/15 with CC chest pain and back pain. Chest pain started 3/14 and progressed. Pt states it feels like something is squishing his heart and his back hurts. CTA c/a/p acquired and revealed recurrent type B dissection with flap beginning distal to takeoff of L subclavian artery and extending to takeoff of SMA.  ? ?Case was discussed with CVTS who recommended medical management.  PCCM consulted for admission in this setting  ? ?Pertinent  Medical History  ?HTN ?Hep C  ?Aortic dissection  ?Aortic aneurysm  ?DVT ? ?Significant Hospital Events: ?Including procedures, antibiotic start and stop dates in addition to other pertinent events   ?3/15 Ed for chest and back pain. Found to have recurrent type B dissection. Admit to PCCM  ? ?Interim History / Subjective:  ?Complains of abdominal pain. 5 loose Bms overnight, nonbloody. BP up overnight despite labetalol.  ? ?Objective   ?Blood pressure 139/83, pulse 61, temperature 98.4 ?F (36.9 ?C), temperature source Oral, resp. rate 14, height 5\' 8"  (1.727 m), weight 89 kg, SpO2 92 %. ?   ?   ? ?Intake/Output Summary (Last 24 hours) at 07/29/2021 0826 ?Last data filed at 07/29/2021 0530 ?Gross per 24 hour  ?Intake 480 ml  ?Output 600 ml  ?Net -120 ml  ? ? ?Filed Weights  ? 07/27/21 0955 07/28/21 0500 07/29/21 0259  ?Weight: 90.7 kg 90.7 kg 89 kg  ? ? ?Examination: ?General: chronically ill appearing man lying in bed in NAD ?HENT: [place]/AT, eyes anicteric ?Lungs:  CTAB, breathing comfortably on RA ?Cardiovascular: S1S2, RRR ?Abdomen: soft, NT ?Extremities: no LE edema, no cyanosis ?Neuro: awake,  alert, answering questions appropriately ?Derm: warm, dry, no rashes. ? ?BUN 32  ?Cr 1.95 ?AST 66 ?T bili 1.1 ?WBC 9.2 ?H/H 12.8/38.2 ?Platelets 102 ? ?Resolved Hospital Problem list   ? ? ?Assessment & Plan:  ? ?Recurrent type B aortic dissection due to hypertensive emergency- L subclavian artery to level of SMA takeoff. Fenestration of dissection flap mid descending thoracic aorta ?S/p graft replacement of ascending thoracic aorta (2016)  ?-Medical management; appreciate TCTS management. No surgical intervention needed.  ?-He has been counseled on the importance of quitting cocaine and other things that make his BP spike as well as the importance of staying on his BP meds. Needs a PCP at discharge-- referred to IMTS. ?-Increase coreg today to 6.25mg  BID- prefer this to metoprolol due to alpha blockade component in addition to Bblocker ?-Adding amlodipine 10mg  daily ?-labetalol and hydralazine PRN ?-pain control with acetaminophen, oxycodone, morphine PRN ?-goal HR <60, SBP <120 ?-ok to transfer out of ICU since controlled with IV pushes ? ?HTN; not compliant with home meds ?-coreg-- prefer to metoprolol due to alpha blockage with his cocaine history ?-amlodipine ?-may need hydralazine or ARB ? ?AKI, likely from acute drop in BP ?-monitor ?-strict I/Os ?-renally dose meds, avoid nephrotoxic meds ? ?Cocaine abuse ?-counseled on the importance of quitting ?-needs TOC consult -- appreciate their assistance ? ?Bilateral inguinal hernias (L>R) ?-needs outpatient follow up with surgery ? ?H/o HCV, completed antiviral therapy, unknown if SVR ?-recheck HCV RNA since he never followed  up after completing treatment> level pending ? ?Diarrhea (nonbloody) today a/w uncontrolled pain-question if he was going into opiate withdrawal ?-oxycodone, fentanyl PRN ? ?Needs to be established with PCP at discharge-- referred to IMTS. ? ?Stable to transfer to floor and TRH's care. ? ?Best Practice (right click and "Reselect all SmartList  Selections" daily)  ? ?Diet/type: Regular consistency (see orders) ?DVT prophylaxis: SCD ?GI prophylaxis: N/A ?Lines: N/A ?Foley:  N/A ?Code Status:  full code ?Last date of multidisciplinary goals of care discussion [d/w pt 07/27/21] ? ?Labs   ?CBC: ?Recent Labs  ?Lab 07/27/21 ?1032 07/27/21 ?1055 07/28/21 ?0336 07/29/21 ?0615  ?WBC 5.4  --  6.2 9.2  ?NEUTROABS 3.3  --   --   --   ?HGB 13.6 14.3 10.8* 12.8*  ?HCT 41.3 42.0 32.9* 38.2*  ?MCV 89.8  --  88.9 88.8  ?PLT 115*  --  102* 102*  ? ? ? ?Basic Metabolic Panel: ?Recent Labs  ?Lab 07/27/21 ?1032 07/27/21 ?1055 07/28/21 ?0336 07/29/21 ?0615  ?NA 137 139 135 136  ?K 4.1 4.1 4.2 4.7  ?CL 101 102 103 105  ?CO2 24  --  24 22  ?GLUCOSE 97 94 100* 132*  ?BUN 21 23 22  32*  ?CREATININE 1.27* 1.20 1.55* 1.95*  ?CALCIUM 9.4  --  8.8* 8.8*  ?MG 2.0  --   --   --   ? ? ?GFR: ?Estimated Creatinine Clearance: 36.6 mL/min (A) (by C-G formula based on SCr of 1.95 mg/dL (H)). ?Recent Labs  ?Lab 07/27/21 ?1032 07/28/21 ?0336 07/29/21 ?0615  ?WBC 5.4 6.2 9.2  ? ? ? ?Liver Function Tests: ?Recent Labs  ?Lab 07/27/21 ?1032 07/28/21 ?0336 07/29/21 ?0615  ?AST 19 14* 66*  ?ALT 14 11 27   ?ALKPHOS 62 54 56  ?BILITOT 1.1 1.0 1.1  ?PROT 7.6 6.4* 6.7  ?ALBUMIN 3.5 2.9* 2.8*  ? ? ?Recent Labs  ?Lab 07/27/21 ?1032  ?LIPASE 40  ? ? ?No results for input(s): AMMONIA in the last 168 hours. ? ?ABG ?   ?Component Value Date/Time  ? PHART 7.379 08/28/2014 0209  ? PCO2ART 41.2 08/28/2014 0209  ? PO2ART 92.0 08/28/2014 0209  ? HCO3 24.3 (H) 08/28/2014 0209  ? TCO2 25 07/27/2021 1055  ? ACIDBASEDEF 1.0 08/28/2014 0209  ? O2SAT 97.0 08/28/2014 0209  ? ?  ? ? ?Julian Hy, DO 07/29/21 8:26 AM ?[facility] ? ? ?

## 2021-07-30 DIAGNOSIS — I7100 Dissection of unspecified site of aorta: Secondary | ICD-10-CM | POA: Diagnosis not present

## 2021-07-30 DIAGNOSIS — I719 Aortic aneurysm of unspecified site, without rupture: Secondary | ICD-10-CM | POA: Diagnosis not present

## 2021-07-30 LAB — BASIC METABOLIC PANEL
Anion gap: 11 (ref 5–15)
BUN: 32 mg/dL — ABNORMAL HIGH (ref 8–23)
CO2: 23 mmol/L (ref 22–32)
Calcium: 9 mg/dL (ref 8.9–10.3)
Chloride: 106 mmol/L (ref 98–111)
Creatinine, Ser: 2.18 mg/dL — ABNORMAL HIGH (ref 0.61–1.24)
GFR, Estimated: 31 mL/min — ABNORMAL LOW (ref >60)
Glucose, Bld: 125 mg/dL — ABNORMAL HIGH (ref 70–99)
Potassium: 4.4 mmol/L (ref 3.5–5.1)
Sodium: 140 mmol/L (ref 135–145)

## 2021-07-30 LAB — CBC
HCT: 36.1 % — ABNORMAL LOW (ref 39.0–52.0)
Hemoglobin: 12.3 g/dL — ABNORMAL LOW (ref 13.0–17.0)
MCH: 29.5 pg (ref 26.0–34.0)
MCHC: 34.1 g/dL (ref 30.0–36.0)
MCV: 86.6 fL (ref 80.0–100.0)
Platelets: 120 10*3/uL — ABNORMAL LOW (ref 150–400)
RBC: 4.17 MIL/uL — ABNORMAL LOW (ref 4.22–5.81)
RDW: 13.5 % (ref 11.5–15.5)
WBC: 11.7 10*3/uL — ABNORMAL HIGH (ref 4.0–10.5)
nRBC: 0 % (ref 0.0–0.2)

## 2021-07-30 LAB — HEPATIC FUNCTION PANEL
ALT: 137 U/L — ABNORMAL HIGH (ref 0–44)
AST: 189 U/L — ABNORMAL HIGH (ref 15–41)
Albumin: 2.6 g/dL — ABNORMAL LOW (ref 3.5–5.0)
Alkaline Phosphatase: 57 U/L (ref 38–126)
Bilirubin, Direct: 0.2 mg/dL (ref 0.0–0.2)
Indirect Bilirubin: 0.9 mg/dL (ref 0.3–0.9)
Total Bilirubin: 1.1 mg/dL (ref 0.3–1.2)
Total Protein: 6.7 g/dL (ref 6.5–8.1)

## 2021-07-30 LAB — HCV RNA QUANT: HCV Quantitative: NOT DETECTED IU/mL (ref >50)

## 2021-07-30 MED ORDER — LACTATED RINGERS IV SOLN: INTRAVENOUS | Status: DC

## 2021-07-30 NOTE — Progress Notes (Signed)
?   07/29/21 2338 07/30/21 0320  ?Vitals  ?Temp 98.3 ?F (36.8 ?C) 98.2 ?F (36.8 ?C)  ?Temp Source  --  Oral  ?BP (!) 145/99 (!) 149/95  ?MAP (mmHg)  --  109  ?BP Location  --  Right Arm  ?BP Method  --  Automatic  ?Patient Position (if appropriate)  --  Lying  ?Pulse Rate 78 84  ?Pulse Rate Source  --  Monitor  ?ECG Heart Rate 79 83  ?Resp 19 18  ? ?20 mg labetalol give per Md order ?

## 2021-07-30 NOTE — Progress Notes (Signed)
?   07/29/21 2041  ?Vitals  ?Temp 98.3 ?F (36.8 ?C)  ?Temp Source Oral  ?BP 133/89  ?MAP (mmHg) 101  ?BP Location Right Arm  ?BP Method Automatic  ?Patient Position (if appropriate) Lying  ?Pulse Rate 88  ?Pulse Rate Source Monitor  ?ECG Heart Rate 92  ?Resp 20  ? ?20 mg labetalol give per MD order ?

## 2021-07-30 NOTE — Progress Notes (Signed)
?   07/29/21 2338  ?Vitals  ?Temp 98.3 ?F (36.8 ?C)  ?Temp Source Oral  ?BP (!) 145/99  ?MAP (mmHg) 110  ?BP Location Right Arm  ?BP Method Automatic  ?Patient Position (if appropriate) Lying  ?Pulse Rate 78  ?Pulse Rate Source Monitor  ?ECG Heart Rate 79  ?Resp 19  ? ?20 mg labetalol given per Md order ?

## 2021-07-30 NOTE — Progress Notes (Signed)
?PROGRESS NOTE ? ? ? ?Edward Hopkins  XTG:[id number] DOB: [birth year] DOA: 07/27/2021 ?PCP: Pcp, No  ? ? ?Brief Narrative:  ?71 yo M PMH HTN, Hep C, polysubstance abuse including ongoing cocaine use, Aortic dissection s/p thoracic aortic graft repair, thoracic aortic aneurysm presented to [facility] 3/15 with CC chest pain and back pain. Chest pain started 3/14 and progressed. Pt states it feels like something is squishing his heart and his back hurts. CTA c/a/p acquired and revealed recurrent type B dissection with flap beginning distal to takeoff of L subclavian artery and extending to takeoff of SMA.  ?  ?Case was discussed with CVTS who recommended medical management.  ?  ?PCCM consulted for admission in this setting  ? ?3/18 TRH pickup ? ? ? ?Consultants:  ?PCCM ? ?Procedures: CT ? ?Antimicrobials:  ?  ? ? ?Subjective: ?Had diarrhea.  No nausea or vomiting or any other complaints.  No chest pain or shortness of breath ? ?Objective: ?Vitals:  ? 07/30/21 0320 07/30/21 2703 07/30/21 5009 07/30/21 3818  ?BP: (!) 149/95 110/69 (!) 157/100 127/85  ?Pulse: 84 72  79  ?Resp: 18  18   ?Temp: 98.2 ?F (36.8 ?C)  98.5 ?F (36.9 ?C)   ?TempSrc: Oral  Oral   ?SpO2: 94% 93%  92%  ?Weight:      ?Height:      ? ? ?Intake/Output Summary (Last 24 hours) at 07/30/2021 1352 ?Last data filed at 07/30/2021 0900 ?Gross per 24 hour  ?Intake 0 ml  ?Output 820 ml  ?Net -820 ml  ? ?Filed Weights  ? 07/28/21 0500 07/29/21 0259 07/29/21 1648  ?Weight: 90.7 kg 89 kg 87.3 kg  ? ? ?Examination: ? ?Calm, NAD ?Cta no w/r ?Reg s1/s2 no gallop ?Soft benign +bs ?No LE edema ?Aaoxox3  ?Mood and affect appropriate in current setting  ? ? ? ?Data Reviewed: I have personally reviewed following labs and imaging studies ? ?CBC: ?Recent Labs  ?Lab 07/27/21 ?1032 07/27/21 ?1055 07/28/21 ?0336 07/29/21 ?0615 07/30/21 ?0230  ?WBC 5.4  --  6.2 9.2 11.7*  ?NEUTROABS 3.3  --   --   --   --   ?HGB 13.6 14.3 10.8* 12.8* 12.3*  ?HCT 41.3 42.0 32.9* 38.2* 36.1*  ?MCV 89.8  --   88.9 88.8 86.6  ?PLT 115*  --  102* 102* 120*  ? ?Basic Metabolic Panel: ?Recent Labs  ?Lab 07/27/21 ?1032 07/27/21 ?1055 07/28/21 ?0336 07/29/21 ?0615 07/30/21 ?0230  ?NA 137 139 135 136 140  ?K 4.1 4.1 4.2 4.7 4.4  ?CL 101 102 103 105 106  ?CO2 24  --  24 22 23   ?GLUCOSE 97 94 100* 132* 125*  ?BUN 21 23 22  32* 32*  ?CREATININE 1.27* 1.20 1.55* 1.95* 2.18*  ?CALCIUM 9.4  --  8.8* 8.8* 9.0  ?MG 2.0  --   --   --   --   ? ?GFR: ?Estimated Creatinine Clearance: 33 mL/min (A) (by C-G formula based on SCr of 2.18 mg/dL (H)). ?Liver Function Tests: ?Recent Labs  ?Lab 07/27/21 ?1032 07/28/21 ?0336 07/29/21 ?0615 07/30/21 ?0230  ?AST 19 14* 66* 189*  ?ALT 14 11 27  137*  ?ALKPHOS 62 54 56 57  ?BILITOT 1.1 1.0 1.1 1.1  ?PROT 7.6 6.4* 6.7 6.7  ?ALBUMIN 3.5 2.9* 2.8* 2.6*  ? ?Recent Labs  ?Lab 07/27/21 ?1032  ?LIPASE 40  ? ?No results for input(s): AMMONIA in the last 168 hours. ?Coagulation Profile: ?Recent Labs  ?Lab 07/27/21 ?1032  ?  INR 1.1  ? ?Cardiac Enzymes: ?No results for input(s): CKTOTAL, CKMB, CKMBINDEX, TROPONINI in the last 168 hours. ?BNP (last 3 results) ?No results for input(s): PROBNP in the last 8760 hours. ?HbA1C: ?No results for input(s): HGBA1C in the last 72 hours. ?CBG: ?No results for input(s): GLUCAP in the last 168 hours. ?Lipid Profile: ?No results for input(s): CHOL, HDL, LDLCALC, TRIG, CHOLHDL, LDLDIRECT in the last 72 hours. ?Thyroid Function Tests: ?No results for input(s): TSH, T4TOTAL, FREET4, T3FREE, THYROIDAB in the last 72 hours. ?Anemia Panel: ?No results for input(s): VITAMINB12, FOLATE, FERRITIN, TIBC, IRON, RETICCTPCT in the last 72 hours. ?Sepsis Labs: ?No results for input(s): PROCALCITON, LATICACIDVEN in the last 168 hours. ? ?Recent Results (from the past 240 hour(s))  ?MRSA Next Gen by PCR, Nasal     Status: None  ? Collection Time: 07/27/21  6:50 PM  ? Specimen: Nasal Mucosa; Nasal Swab  ?Result Value Ref Range Status  ? MRSA by PCR Next Gen NOT DETECTED NOT DETECTED Final  ?   Comment: (NOTE) ?The GeneXpert MRSA Assay (FDA approved for NASAL specimens only), ?is one component of a comprehensive MRSA colonization surveillance ?program. It is not intended to diagnose MRSA infection nor to guide ?or monitor treatment for MRSA infections. ?Test performance is not FDA approved in patients less than 2 years ?old. ?Performed at [facility], [place] [address], [place], [place] ?[address] ?  ?  ? ? ? ? ? ?Radiology Studies: ?No results found. ? ? ? ? ? ?Scheduled Meds: ? amLODipine  10 mg Oral Daily  ? carvedilol  6.25 mg Oral BID WC  ? Chlorhexidine Gluconate Cloth  6 each Topical Daily  ? folic acid  1 mg Oral Daily  ? multivitamin with minerals  1 tablet Oral Daily  ? thiamine  100 mg Oral Daily  ? Or  ? thiamine  100 mg Intravenous Daily  ? ?Continuous Infusions: ? sodium chloride    ? ? ?Assessment & Plan: ?  ?Principal Problem: ?  Aortic aneurysm with dissection ([place]) ? ? ?Recurrent type B aortic dissection due to hypertensive emergency- L subclavian artery to level of SMA takeoff. Fenestration of dissection flap mid descending thoracic aorta ?S/p graft replacement of ascending thoracic aorta (2016)  ?-Medical management; appreciate TCTS management. No surgical intervention needed.  ?-He has been counseled on the importance of quitting cocaine and other things that make his BP spike as well as the importance of staying on his BP meds. Needs a PCP at discharge-- referred to IMTS. ?-Increase coreg today to 6.25mg  BID- prefer this to metoprolol due to alpha blockade component in addition to Bblocker ?3/18 goal sbp <120 HR <60 ?Continue with current meds. If need to will incease coreg ?Dc iv morphine ? ? ?Diarrhea ?Question about opiate withdrawal ?On oxycodone. ?Will start gentle IVF for hydration ?  ?HTN; not compliant with home meds ?-coreg-- prefer to metoprolol due to alpha blockage with his cocaine history ?3/18 goal bp as above ?If able to , increase coreg ?  ?AKI, likely from acute  drop in BP ?3/18 also with diarrhea, ?Possibly prerenal ?Will start gentle IVF for hydration ? ? ?Cocaine abuse ?-counseled on the importance of quitting ?-needs TOC consult -- appreciate their assistance ?  ?Bilateral inguinal hernias (L>R) ?-needs outpatient follow up with surgery ?  ?H/o HCV, completed antiviral therapy, unknown if SVR ?-recheck HCV RNA since he never followed up after completing treatment> level pending ?  ?Diarrhea (nonbloody) today a/w  uncontrolled pain-question if he was going into opiate withdrawal ?-oxycodone, fentanyl PRN ?  ? ? ?DVT prophylaxis: SCD ?Code Status: Full ?Family Communication: None at bedside ?Disposition Plan:  ?Status is: Inpatient ?Remains inpatient appropriate because: BP controlled.  IV treatment. ?  ? ? ? ? ? LOS: 3 days  ? ?Time spent: 35 minutes ? ? ? ?Nolberto Hanlon, MD ?[facility] ?Pager [phone] ? ?If 7PM-7AM, please contact night-coverage ?07/30/2021, 1:52 PM   ?

## 2021-07-31 ENCOUNTER — Inpatient Hospital Stay (HOSPITAL_COMMUNITY): Payer: Medicare Other

## 2021-07-31 DIAGNOSIS — I7100 Dissection of unspecified site of aorta: Secondary | ICD-10-CM | POA: Diagnosis not present

## 2021-07-31 DIAGNOSIS — I719 Aortic aneurysm of unspecified site, without rupture: Secondary | ICD-10-CM | POA: Diagnosis not present

## 2021-07-31 LAB — CBC
HCT: 34.9 % — ABNORMAL LOW (ref 39.0–52.0)
Hemoglobin: 11.5 g/dL — ABNORMAL LOW (ref 13.0–17.0)
MCH: 28.7 pg (ref 26.0–34.0)
MCHC: 33 g/dL (ref 30.0–36.0)
MCV: 87 fL (ref 80.0–100.0)
Platelets: 142 10*3/uL — ABNORMAL LOW (ref 150–400)
RBC: 4.01 MIL/uL — ABNORMAL LOW (ref 4.22–5.81)
RDW: 13.7 % (ref 11.5–15.5)
WBC: 9.8 10*3/uL (ref 4.0–10.5)
nRBC: 0.2 % (ref 0.0–0.2)

## 2021-07-31 LAB — URINALYSIS, ROUTINE W REFLEX MICROSCOPIC
Bacteria, UA: NONE SEEN
Bilirubin Urine: NEGATIVE
Glucose, UA: NEGATIVE mg/dL
Ketones, ur: NEGATIVE mg/dL
Leukocytes,Ua: NEGATIVE
Nitrite: NEGATIVE
Protein, ur: 100 mg/dL — AB
Specific Gravity, Urine: 1.02 (ref 1.005–1.030)
pH: 5 (ref 5.0–8.0)

## 2021-07-31 LAB — HEPATIC FUNCTION PANEL
ALT: 138 U/L — ABNORMAL HIGH (ref 0–44)
AST: 106 U/L — ABNORMAL HIGH (ref 15–41)
Albumin: 2.3 g/dL — ABNORMAL LOW (ref 3.5–5.0)
Alkaline Phosphatase: 52 U/L (ref 38–126)
Bilirubin, Direct: 0.2 mg/dL (ref 0.0–0.2)
Indirect Bilirubin: 0.6 mg/dL (ref 0.3–0.9)
Total Bilirubin: 0.8 mg/dL (ref 0.3–1.2)
Total Protein: 6.6 g/dL (ref 6.5–8.1)

## 2021-07-31 LAB — BASIC METABOLIC PANEL
Anion gap: 9 (ref 5–15)
BUN: 42 mg/dL — ABNORMAL HIGH (ref 8–23)
CO2: 22 mmol/L (ref 22–32)
Calcium: 8.9 mg/dL (ref 8.9–10.3)
Chloride: 108 mmol/L (ref 98–111)
Creatinine, Ser: 2.65 mg/dL — ABNORMAL HIGH (ref 0.61–1.24)
GFR, Estimated: 25 mL/min — ABNORMAL LOW (ref >60)
Glucose, Bld: 109 mg/dL — ABNORMAL HIGH (ref 70–99)
Potassium: 4.4 mmol/L (ref 3.5–5.1)
Sodium: 139 mmol/L (ref 135–145)

## 2021-07-31 MED ORDER — LACTATED RINGERS IV SOLN: INTRAVENOUS | Status: DC

## 2021-07-31 NOTE — Progress Notes (Signed)
?PROGRESS NOTE ? ? ? ?Edward Hopkins  QHU:[id number] DOB: 12/27/1950 DOA: 07/27/2021 ?PCP: Pcp, No  ? ? ?Brief Narrative:  ?71 yo M PMH HTN, Hep C, polysubstance abuse including ongoing cocaine use, Aortic dissection s/p thoracic aortic graft repair, thoracic aortic aneurysm presented to [facility] 3/15 with CC chest pain and back pain. Chest pain started 3/14 and progressed. Pt states it feels like something is squishing his heart and his back hurts. CTA c/a/p acquired and revealed recurrent type B dissection with flap beginning distal to takeoff of L subclavian artery and extending to takeoff of SMA.  ?  ?Case was discussed with CVTS who recommended medical management.  ?  ?PCCM consulted for admission in this setting  ? ?3/18 TRH pickup ? ?3/19 creatinine getting worse ? ?Consultants:  ?PCCM ? ?Procedures: CT ? ?Antimicrobials:  ?  ? ? ?Subjective: ?Has no further diarrhea this am. Per nsg had very little po intake so far. ? ? ?Objective: ?Vitals:  ? 07/31/21 0348 07/31/21 0416 07/31/21 0742 07/31/21 1115  ?BP: 102/76  101/73 117/81  ?Pulse: 96  81 85  ?Resp: 18  16 18   ?Temp: 100.3 ?F (37.9 ?C)  98.8 ?F (37.1 ?C) 98.7 ?F (37.1 ?C)  ?TempSrc: Oral  Oral Oral  ?SpO2: 91%  94% 95%  ?Weight:  83.6 kg    ?Height:      ? ? ?Intake/Output Summary (Last 24 hours) at 07/31/2021 1355 ?Last data filed at 07/31/2021 1116 ?Gross per 24 hour  ?Intake 892.5 ml  ?Output 800 ml  ?Net 92.5 ml  ? ?Filed Weights  ? 07/29/21 0259 07/29/21 1648 07/31/21 0416  ?Weight: 89 kg 87.3 kg 83.6 kg  ? ? ?Examination: ?Calm, NAD ?Cta no w/r ?Reg s1/s2 no gallop ?Soft benign +bs ?No edema ?Awake and alert ?Mood and affect appropriate in current setting  ? ? ? ?Data Reviewed: I have personally reviewed following labs and imaging studies ? ?CBC: ?Recent Labs  ?Lab 07/27/21 ?1032 07/27/21 ?1055 07/28/21 ?0336 07/29/21 ?0615 07/30/21 ?0230 07/31/21 ?0138  ?WBC 5.4  --  6.2 9.2 11.7* 9.8  ?NEUTROABS 3.3  --   --   --   --   --   ?HGB 13.6 14.3 10.8* 12.8*  12.3* 11.5*  ?HCT 41.3 42.0 32.9* 38.2* 36.1* 34.9*  ?MCV 89.8  --  88.9 88.8 86.6 87.0  ?PLT 115*  --  102* 102* 120* 142*  ? ?Basic Metabolic Panel: ?Recent Labs  ?Lab 07/27/21 ?1032 07/27/21 ?1055 07/28/21 ?0336 07/29/21 ?0615 07/30/21 ?0230 07/31/21 ?0138  ?NA 137 139 135 136 140 139  ?K 4.1 4.1 4.2 4.7 4.4 4.4  ?CL 101 102 103 105 106 108  ?CO2 24  --  24 22 23 22   ?GLUCOSE 97 94 100* 132* 125* 109*  ?BUN 21 23 22  32* 32* 42*  ?CREATININE 1.27* 1.20 1.55* 1.95* 2.18* 2.65*  ?CALCIUM 9.4  --  8.8* 8.8* 9.0 8.9  ?MG 2.0  --   --   --   --   --   ? ?GFR: ?Estimated Creatinine Clearance: 24.8 mL/min (A) (by C-G formula based on SCr of 2.65 mg/dL (H)). ?Liver Function Tests: ?Recent Labs  ?Lab 07/27/21 ?1032 07/28/21 ?0336 07/29/21 ?0615 07/30/21 ?0230 07/31/21 ?0926  ?AST 19 14* 66* 189* 106*  ?ALT 14 11 27  137* 138*  ?ALKPHOS 62 54 56 57 52  ?BILITOT 1.1 1.0 1.1 1.1 0.8  ?PROT 7.6 6.4* 6.7 6.7 6.6  ?ALBUMIN 3.5 2.9* 2.8* 2.6* 2.3*  ? ?  Recent Labs  ?Lab 07/27/21 ?1032  ?LIPASE 40  ? ?No results for input(s): AMMONIA in the last 168 hours. ?Coagulation Profile: ?Recent Labs  ?Lab 07/27/21 ?1032  ?INR 1.1  ? ?Cardiac Enzymes: ?No results for input(s): CKTOTAL, CKMB, CKMBINDEX, TROPONINI in the last 168 hours. ?BNP (last 3 results) ?No results for input(s): PROBNP in the last 8760 hours. ?HbA1C: ?No results for input(s): HGBA1C in the last 72 hours. ?CBG: ?No results for input(s): GLUCAP in the last 168 hours. ?Lipid Profile: ?No results for input(s): CHOL, HDL, LDLCALC, TRIG, CHOLHDL, LDLDIRECT in the last 72 hours. ?Thyroid Function Tests: ?No results for input(s): TSH, T4TOTAL, FREET4, T3FREE, THYROIDAB in the last 72 hours. ?Anemia Panel: ?No results for input(s): VITAMINB12, FOLATE, FERRITIN, TIBC, IRON, RETICCTPCT in the last 72 hours. ?Sepsis Labs: ?No results for input(s): PROCALCITON, LATICACIDVEN in the last 168 hours. ? ?Recent Results (from the past 240 hour(s))  ?MRSA Next Gen by PCR, Nasal     Status:  None  ? Collection Time: 07/27/21  6:50 PM  ? Specimen: Nasal Mucosa; Nasal Swab  ?Result Value Ref Range Status  ? MRSA by PCR Next Gen NOT DETECTED NOT DETECTED Final  ?  Comment: (NOTE) ?The GeneXpert MRSA Assay (FDA approved for NASAL specimens only), ?is one component of a comprehensive MRSA colonization surveillance ?program. It is not intended to diagnose MRSA infection nor to guide ?or monitor treatment for MRSA infections. ?Test performance is not FDA approved in patients less than 2 years ?old. ?Performed at [facility], [place] [address], [place], [place] ?[address] ?  ?  ? ? ? ? ? ?Radiology Studies: ?No results found. ? ? ? ? ? ?Scheduled Meds: ? amLODipine  10 mg Oral Daily  ? carvedilol  6.25 mg Oral BID WC  ? Chlorhexidine Gluconate Cloth  6 each Topical Daily  ? folic acid  1 mg Oral Daily  ? multivitamin with minerals  1 tablet Oral Daily  ? thiamine  100 mg Oral Daily  ? Or  ? thiamine  100 mg Intravenous Daily  ? ?Continuous Infusions: ? sodium chloride    ? lactated ringers 75 mL/hr at 07/31/21 1035  ? ? ?Assessment & Plan: ?  ?Principal Problem: ?  Aortic aneurysm with dissection ([place]) ? ? ?Recurrent type B aortic dissection due to hypertensive emergency- L subclavian artery to level of SMA takeoff. Fenestration of dissection flap mid descending thoracic aorta ?S/p graft replacement of ascending thoracic aorta (2016)  ?-Medical management; appreciate TCTS management. No surgical intervention needed.  ?-He has been counseled on the importance of quitting cocaine and other things that make his BP spike as well as the importance of staying on his BP meds. Needs a PCP at discharge-- referred to IMTS. ?-Increase coreg today to 6.25mg  BID- prefer this to metoprolol due to alpha blockade component in addition to Bblocker ?3/18 goal sbp <120 HR <60 ?3/19 bp stable. More on lower side.  ? ? ?Diarrhea ?Question about opiate withdrawal ?On oxycodone. ?3/19 resolved.  ?Continue ivf since decrease po  intake currently ?  ?HTN; not compliant with home meds ?-coreg-- prefer to metoprolol due to alpha blockage with his cocaine history ?3/19 at goal ?  ?AKI, likely from acute drop in BP ?3/18 also with diarrhea, ?Possibly prerenal ?3/19diarrhea resolved. Low bp may cause insult?  ?Creatinine worse despite placed on ivf ?Will consult nephrology ? ? ?Cocaine abuse ?-counseled on the importance of quitting ?-needs TOC consult -- appreciate their assistance ?  ?  Bilateral inguinal hernias (L>R) ?-needs outpatient follow up with surgery ?  ?H/o HCV, completed antiviral therapy, unknown if SVR ?-recheck HCV RNA since he never followed up after completing treatment> level pending ?  ? ?  ? ? ?DVT prophylaxis: SCD ?Code Status: Full ?Family Communication: None at bedside ?Disposition Plan:  ?Status is: Inpatient ?Remains inpatient appropriate because: BP controlled.  IV treatment.creatinine worsening, nephrology consulted ?  ? ? ? ? ? LOS: 4 days  ? ?Time spent: 35 minutes ? ? ? ?Nolberto Hanlon, MD ?[facility] ?Pager [phone] ? ?If 7PM-7AM, please contact night-coverage ?07/31/2021, 1:55 PM   ?

## 2021-07-31 NOTE — Consult Note (Signed)
Nephrology Consult  ? ?Requesting provider: Nolberto Hanlon ?Service requesting consult: Hospitalist ?Reason for consult: AKI ? ? ?Assessment/Recommendations: Edward Hopkins is a/an 71 y.o. male with a past medical history hepatitis C, cocaine use, aortic dissection s/p repair who present w/ chest pain c/b AKI  ? ?Non-Oliguric AKI on CKD 3a: BL Crt 1.3. AKI likely 2/2 contrast associate kidney injury and large swings in BP w/ low Bps at time. Dissection involving renal arteries less likely given imaging on 3/15. Some bladder distention on [place] ?-Stop norvasc ?-Allow BP to run slightly higher ?-serial bladder scans for the next 2 days to ensure voiding appropriately ?-Continue to monitor daily Cr, Dose meds for GFR ?-Repeat UA ?-Monitor Daily I/Os, Daily weight  ?-Maintain MAP>65 for optimal renal perfusion.  ?-Avoid nephrotoxic medications including NSAIDs ?-Use synthetic opioids (Fentanyl/Dilaudid) if needed ?-F/u formal RUS read ?-Currently no indication for HD ? ?Chest pain/Aortic Dissection: chest pain improved. Avoid cocaine. Continue coreg ? ?Hypertension: mostly related to cocaine. BP swinging low. Hold norasc ? ?Cocaine use disorder: encouraged cessation ? ?Hep C: s/p treatment ? ?LFT elevation: likely related to hypotension as well. Improving ? ?Thrombocytopenia: HTN induced hemolysis likely. Improving. ? ? ? ? ?Recommendations conveyed to primary service.  ? ? ?Reesa Chew ?[facility] ?07/31/2021 ?2:11 PM ? ? ?_____________________________________________________________________________________ ?CC: chest pain ? ?History of Present Illness: Edward Hopkins is a/an 71 y.o. male with a past medical history of hepatitis C, cocaine use, aortic dissection s/p repair who presents with chest pain. ? ?Presented on 3/15 with chest pain for several hours. He was using cocaine that night as well as alcohol. The pain was diffuse through the chest and did not radiate but was severe. He denies nausea,  vomiting, or significant SOB.  ? ?He was found to be markedly hypertensive and felt to have hypertensive emergency likely 2/2 cocaine. Also found to have recurrent acute aortic type B dissection and received contrast on 3/15. Surgery recommended medical management and he was admitted to the ICU. With medical management his chest pain but his BP has fluctuated widely and slo dropped fairly rapidly with intermittent systolics in the 44H. Crt baseline appears to be 1.3 and has risen to 2.7 today. He has continued to make good urine. ? ?Patient states he is feeling off but unable to provide a lot of details.  States that he was not taking any NSAIDs at home.  No blood in his urine or pain with urination.  Does not feel like it is difficult to urinate.  No ongoing chest pain right now.  Did have some nausea with no vomiting earlier in the hospital stay. ? ?I was in the room while renal ultrasound was occurring.  No obvious hydronephrosis on my view but his bladder was fairly distended.  I had him urinate at bedside and he was able to urinate out about 150 cc of urine. ? ? ?Medications:  ?Current Facility-Administered Medications  ?Medication Dose Route Frequency Provider Last Rate Last Admin  ? 0.9 %  sodium chloride infusion   Intra-arterial PRN Maryjane Hurter, MD      ? acetaminophen (TYLENOL) tablet 650 mg  650 mg Oral Q6H PRN Noemi Chapel P, DO   650 mg at 07/29/21 0536  ? amLODipine (NORVASC) tablet 10 mg  10 mg Oral Daily Noemi Chapel P, DO   10 mg at 07/30/21 1240  ? carvedilol (COREG) tablet 6.25 mg  6.25 mg Oral BID WC Julian Hy, DO  6.25 mg at 07/31/21 1039  ? Chlorhexidine Gluconate Cloth 2 % PADS 6 each  6 each Topical Daily Maryjane Hurter, MD   6 each at 07/28/21 2230  ? docusate sodium (COLACE) capsule 100 mg  100 mg Oral BID PRN Maryjane Hurter, MD   100 mg at 07/28/21 2355  ? folic acid (FOLVITE) tablet 1 mg  1 mg Oral Daily Maryjane Hurter, MD   1 mg at 07/31/21 1039  ? labetalol  (NORMODYNE) injection 20 mg  20 mg Intravenous Q2H PRN Mauri Brooklyn, MD   20 mg at 07/30/21 0323  ? lactated ringers infusion   Intravenous Continuous Nolberto Hanlon, MD 75 mL/hr at 07/31/21 1035 New Bag at 07/31/21 1035  ? multivitamin with minerals tablet 1 tablet  1 tablet Oral Daily Maryjane Hurter, MD   1 tablet at 07/31/21 1038  ? oxyCODONE (Oxy IR/ROXICODONE) immediate release tablet 15 mg  15 mg Oral Q6H PRN Noemi Chapel P, DO   15 mg at 07/30/21 1941  ? polyethylene glycol (MIRALAX / GLYCOLAX) packet 17 g  17 g Oral Daily PRN Maryjane Hurter, MD      ? thiamine tablet 100 mg  100 mg Oral Daily Maryjane Hurter, MD   100 mg at 07/31/21 1039  ? Or  ? thiamine (B-1) injection 100 mg  100 mg Intravenous Daily Maryjane Hurter, MD      ?  ? ?ALLERGIES ?Patient has no known allergies. ? ?MEDICAL HISTORY ?Past Medical History:  ?Diagnosis Date  ? CHF (congestive heart failure) ([place])   ? Constipation   ? DVT (deep venous thrombosis) ([place]) 1995  ? Endocarditis   ? Hypertension   ? Intramural aortic hematoma ([facility])   ? Osteomyelitis ([place])   ? Right femoral vein DVT ([place]) 07/2014  ? Septic arthritis ([place])   ? both hips  (08/04/2014)  ?  ? ?SOCIAL HISTORY ?Social History  ? ?Socioeconomic History  ? Marital status: Single  ?  Spouse name: Not on file  ? Number of children: Not on file  ? Years of education: Not on file  ? Highest education level: Not on file  ?Occupational History  ? Not on file  ?Tobacco Use  ? Smoking status: Every Day  ?  Packs/day: 1.00  ?  Years: 50.00  ?  Pack years: 50.00  ?  Types: Cigarettes  ? Smokeless tobacco: Never  ?Substance and Sexual Activity  ? Alcohol use: Yes  ?  Alcohol/week: 2.0 standard drinks  ?  Types: 2 Cans of beer per week  ?  Comment: 08/04/2014 "drink 2 weekends out of the month; maybe 3-4 shots and some beer"  ? Drug use: Yes  ?  Types: Cocaine, Marijuana  ?  Comment: 08/04/2014 "only use cocaine on the weekends; not q weekend; smoke marijuana maybe once/month, if  that"  ? Sexual activity: Not on file  ?Other Topics Concern  ? Not on file  ?Social History Narrative  ? Not on file  ? ?Social Determinants of Health  ? ?Financial Resource Strain: Not on file  ?Food Insecurity: Not on file  ?Transportation Needs: Not on file  ?Physical Activity: Not on file  ?Stress: Not on file  ?Social Connections: Not on file  ?Intimate Partner Violence: Not on file  ?  ? ?FAMILY HISTORY ?No family history on file.  ? ? ?Review of Systems: ?12 systems reviewed ?Otherwise as per HPI, all other systems reviewed and negative ? ?  Physical Exam: ?Vitals:  ? 07/31/21 0742 07/31/21 1115  ?BP: 101/73 117/81  ?Pulse: 81 85  ?Resp: 16 18  ?Temp: 98.8 ?F (37.1 ?C) 98.7 ?F (37.1 ?C)  ?SpO2: 94% 95%  ? ?Total I/O ?In: 0  ?Out: 100 [Urine:100] ? ?Intake/Output Summary (Last 24 hours) at 07/31/2021 1411 ?Last data filed at 07/31/2021 1116 ?Gross per 24 hour  ?Intake 892.5 ml  ?Output 800 ml  ?Net 92.5 ml  ? ?General: Chronically ill-appearing, lying in bed, no distress ?HEENT: anicteric sclera, oropharynx clear without lesions ?CV: Normal rate, no obvious murmur, no lower extremity edema ?Lungs: clear to auscultation bilaterally, normal work of breathing ?Abd: soft, non-tender, mild distention ?Skin: no visible lesions or rashes ?Psych: alert, engaged, appropriate mood and affect ?Musculoskeletal: no obvious deformities ?Neuro: normal speech, no gross focal deficits  ? ?Test Results ?Reviewed ?Lab Results  ?Component Value Date  ? NA 139 07/31/2021  ? K 4.4 07/31/2021  ? CL 108 07/31/2021  ? CO2 22 07/31/2021  ? BUN 42 (H) 07/31/2021  ? CREATININE 2.65 (H) 07/31/2021  ? CALCIUM 8.9 07/31/2021  ? ALBUMIN 2.3 (L) 07/31/2021  ? ? ?CBC ?Recent Labs  ?Lab 07/27/21 ?1032 07/27/21 ?1055 07/29/21 ?0615 07/30/21 ?0230 07/31/21 ?0138  ?WBC 5.4   < > 9.2 11.7* 9.8  ?NEUTROABS 3.3  --   --   --   --   ?HGB 13.6   < > 12.8* 12.3* 11.5*  ?HCT 41.3   < > 38.2* 36.1* 34.9*  ?MCV 89.8   < > 88.8 86.6 87.0  ?PLT 115*   < > 102*  120* 142*  ? < > = values in this interval not displayed.  ? ? ?I have reviewed all relevant outside healthcare records related to the patient's current hospitalization ? ?

## 2021-08-01 ENCOUNTER — Inpatient Hospital Stay (HOSPITAL_COMMUNITY): Payer: Medicare Other

## 2021-08-01 ENCOUNTER — Encounter (HOSPITAL_COMMUNITY)
Admission: EM | Disposition: A | Discharge status: Rehabilitation Facility | Payer: Self-pay | Source: Home / Self Care | Attending: Internal Medicine

## 2021-08-01 ENCOUNTER — Other Ambulatory Visit: Payer: Self-pay

## 2021-08-01 ENCOUNTER — Inpatient Hospital Stay (HOSPITAL_COMMUNITY): Payer: Medicare Other | Admitting: Anesthesiology

## 2021-08-01 ENCOUNTER — Telehealth: Payer: Self-pay | Admitting: Internal Medicine

## 2021-08-01 DIAGNOSIS — N183 Chronic kidney disease, stage 3 unspecified: Secondary | ICD-10-CM | POA: Diagnosis not present

## 2021-08-01 DIAGNOSIS — N179 Acute kidney failure, unspecified: Secondary | ICD-10-CM | POA: Diagnosis not present

## 2021-08-01 DIAGNOSIS — I10 Essential (primary) hypertension: Secondary | ICD-10-CM

## 2021-08-01 DIAGNOSIS — I712 Thoracic aortic aneurysm, without rupture, unspecified: Secondary | ICD-10-CM | POA: Diagnosis not present

## 2021-08-01 DIAGNOSIS — F1410 Cocaine abuse, uncomplicated: Secondary | ICD-10-CM | POA: Diagnosis not present

## 2021-08-01 DIAGNOSIS — I719 Aortic aneurysm of unspecified site, without rupture: Secondary | ICD-10-CM | POA: Diagnosis not present

## 2021-08-01 DIAGNOSIS — I71012 Dissection of descending thoracic aorta: Secondary | ICD-10-CM

## 2021-08-01 DIAGNOSIS — I7100 Dissection of unspecified site of aorta: Secondary | ICD-10-CM | POA: Diagnosis not present

## 2021-08-01 DIAGNOSIS — R578 Other shock: Secondary | ICD-10-CM

## 2021-08-01 HISTORY — PX: THORACIC AORTIC ENDOVASCULAR STENT GRAFT: SHX6112

## 2021-08-01 HISTORY — PX: ULTRASOUND GUIDANCE FOR VASCULAR ACCESS: SHX6516

## 2021-08-01 HISTORY — PX: ABDOMINAL AORTIC ENDOVASCULAR STENT GRAFT: SHX5707

## 2021-08-01 LAB — BASIC METABOLIC PANEL
Anion gap: 10 (ref 5–15)
Anion gap: 9 (ref 5–15)
BUN: 49 mg/dL — ABNORMAL HIGH (ref 8–23)
BUN: 49 mg/dL — ABNORMAL HIGH (ref 8–23)
CO2: 23 mmol/L (ref 22–32)
CO2: 19 mmol/L — ABNORMAL LOW (ref 22–32)
Calcium: 8.5 mg/dL — ABNORMAL LOW (ref 8.9–10.3)
Calcium: 8.6 mg/dL — ABNORMAL LOW (ref 8.9–10.3)
Chloride: 104 mmol/L (ref 98–111)
Chloride: 106 mmol/L (ref 98–111)
Creatinine, Ser: 2.61 mg/dL — ABNORMAL HIGH (ref 0.61–1.24)
Creatinine, Ser: 2.62 mg/dL — ABNORMAL HIGH (ref 0.61–1.24)
GFR, Estimated: 25 mL/min — ABNORMAL LOW (ref >60)
GFR, Estimated: 25 mL/min — ABNORMAL LOW (ref >60)
Glucose, Bld: 86 mg/dL (ref 70–99)
Glucose, Bld: 191 mg/dL — ABNORMAL HIGH (ref 70–99)
Potassium: 3.9 mmol/L (ref 3.5–5.1)
Potassium: 4.8 mmol/L (ref 3.5–5.1)
Sodium: 137 mmol/L (ref 135–145)
Sodium: 134 mmol/L — ABNORMAL LOW (ref 135–145)

## 2021-08-01 LAB — CBC WITH DIFFERENTIAL/PLATELET
Abs Immature Granulocytes: 0.4 10*3/uL — ABNORMAL HIGH (ref 0.00–0.07)
Basophils Absolute: 0.1 10*3/uL (ref 0.0–0.1)
Basophils Relative: 1 %
Eosinophils Absolute: 0.1 10*3/uL (ref 0.0–0.5)
Eosinophils Relative: 1 %
HCT: 38.8 % — ABNORMAL LOW (ref 39.0–52.0)
Hemoglobin: 13.3 g/dL (ref 13.0–17.0)
Immature Granulocytes: 4 %
Lymphocytes Relative: 22 %
Lymphs Abs: 2.4 10*3/uL (ref 0.7–4.0)
MCH: 29.2 pg (ref 26.0–34.0)
MCHC: 34.3 g/dL (ref 30.0–36.0)
MCV: 85.1 fL (ref 80.0–100.0)
Monocytes Absolute: 1.1 10*3/uL — ABNORMAL HIGH (ref 0.1–1.0)
Monocytes Relative: 10 %
Neutro Abs: 6.9 10*3/uL (ref 1.7–7.7)
Neutrophils Relative %: 62 %
Platelets: 92 10*3/uL — ABNORMAL LOW (ref 150–400)
RBC: 4.56 MIL/uL (ref 4.22–5.81)
RDW: 14.9 % (ref 11.5–15.5)
WBC: 11 10*3/uL — ABNORMAL HIGH (ref 4.0–10.5)
nRBC: 1.2 % — ABNORMAL HIGH (ref 0.0–0.2)

## 2021-08-01 LAB — POCT I-STAT EG7
Acid-base deficit: 6 mmol/L — ABNORMAL HIGH (ref 0.0–2.0)
Bicarbonate: 21.8 mmol/L (ref 20.0–28.0)
Calcium, Ion: 1.09 mmol/L — ABNORMAL LOW (ref 1.15–1.40)
HCT: 35 % — ABNORMAL LOW (ref 39.0–52.0)
Hemoglobin: 11.9 g/dL — ABNORMAL LOW (ref 13.0–17.0)
O2 Saturation: 67 %
Patient temperature: 35.3
Potassium: 4.6 mmol/L (ref 3.5–5.1)
Sodium: 138 mmol/L (ref 135–145)
TCO2: 23 mmol/L (ref 22–32)
pCO2, Ven: 46.2 mmHg (ref 44–60)
pH, Ven: 7.273 (ref 7.25–7.43)
pO2, Ven: 36 mmHg (ref 32–45)

## 2021-08-01 LAB — POCT I-STAT 7, (LYTES, BLD GAS, ICA,H+H)
Acid-base deficit: 4 mmol/L — ABNORMAL HIGH (ref 0.0–2.0)
Acid-base deficit: 5 mmol/L — ABNORMAL HIGH (ref 0.0–2.0)
Acid-base deficit: 3 mmol/L — ABNORMAL HIGH (ref 0.0–2.0)
Bicarbonate: 22.3 mmol/L (ref 20.0–28.0)
Bicarbonate: 20.1 mmol/L (ref 20.0–28.0)
Bicarbonate: 20.4 mmol/L (ref 20.0–28.0)
Calcium, Ion: 1.22 mmol/L (ref 1.15–1.40)
Calcium, Ion: 1.32 mmol/L (ref 1.15–1.40)
Calcium, Ion: 1.24 mmol/L (ref 1.15–1.40)
HCT: 26 % — ABNORMAL LOW (ref 39.0–52.0)
HCT: 35 % — ABNORMAL LOW (ref 39.0–52.0)
HCT: 39 % (ref 39.0–52.0)
Hemoglobin: 8.8 g/dL — ABNORMAL LOW (ref 13.0–17.0)
Hemoglobin: 11.9 g/dL — ABNORMAL LOW (ref 13.0–17.0)
Hemoglobin: 13.3 g/dL (ref 13.0–17.0)
O2 Saturation: 77 %
O2 Saturation: 96 %
O2 Saturation: 96 %
Patient temperature: 34.2
Patient temperature: 98.9
Potassium: 4.3 mmol/L (ref 3.5–5.1)
Potassium: 4.7 mmol/L (ref 3.5–5.1)
Potassium: 4.4 mmol/L (ref 3.5–5.1)
Sodium: 139 mmol/L (ref 135–145)
Sodium: 135 mmol/L (ref 135–145)
Sodium: 136 mmol/L (ref 135–145)
TCO2: 24 mmol/L (ref 22–32)
TCO2: 21 mmol/L — ABNORMAL LOW (ref 22–32)
TCO2: 21 mmol/L — ABNORMAL LOW (ref 22–32)
pCO2 arterial: 47.3 mmHg (ref 32–48)
pCO2 arterial: 31.6 mmHg — ABNORMAL LOW (ref 32–48)
pCO2 arterial: 32.3 mmHg (ref 32–48)
pH, Arterial: 7.281 — ABNORMAL LOW (ref 7.35–7.45)
pH, Arterial: 7.399 (ref 7.35–7.45)
pH, Arterial: 7.408 (ref 7.35–7.45)
pO2, Arterial: 47 mmHg — ABNORMAL LOW (ref 83–108)
pO2, Arterial: 70 mmHg — ABNORMAL LOW (ref 83–108)
pO2, Arterial: 79 mmHg — ABNORMAL LOW (ref 83–108)

## 2021-08-01 LAB — CBC
HCT: 34.2 % — ABNORMAL LOW (ref 39.0–52.0)
HCT: 28.1 % — ABNORMAL LOW (ref 39.0–52.0)
HCT: 37.7 % — ABNORMAL LOW (ref 39.0–52.0)
Hemoglobin: 11.2 g/dL — ABNORMAL LOW (ref 13.0–17.0)
Hemoglobin: 9 g/dL — ABNORMAL LOW (ref 13.0–17.0)
Hemoglobin: 12.8 g/dL — ABNORMAL LOW (ref 13.0–17.0)
MCH: 28.8 pg (ref 26.0–34.0)
MCH: 28.8 pg (ref 26.0–34.0)
MCH: 29.6 pg (ref 26.0–34.0)
MCHC: 32.7 g/dL (ref 30.0–36.0)
MCHC: 32 g/dL (ref 30.0–36.0)
MCHC: 34 g/dL (ref 30.0–36.0)
MCV: 87.9 fL (ref 80.0–100.0)
MCV: 89.8 fL (ref 80.0–100.0)
MCV: 87.1 fL (ref 80.0–100.0)
Platelets: UNDETERMINED 10*3/uL (ref 150–400)
Platelets: 103 10*3/uL — ABNORMAL LOW (ref 150–400)
Platelets: 84 10*3/uL — ABNORMAL LOW (ref 150–400)
RBC: 3.89 MIL/uL — ABNORMAL LOW (ref 4.22–5.81)
RBC: 3.13 MIL/uL — ABNORMAL LOW (ref 4.22–5.81)
RBC: 4.33 MIL/uL (ref 4.22–5.81)
RDW: 14 % (ref 11.5–15.5)
RDW: 14.2 % (ref 11.5–15.5)
RDW: 14.6 % (ref 11.5–15.5)
WBC: 7 10*3/uL (ref 4.0–10.5)
WBC: 8.9 10*3/uL (ref 4.0–10.5)
WBC: 12.7 10*3/uL — ABNORMAL HIGH (ref 4.0–10.5)
nRBC: 0.4 % — ABNORMAL HIGH (ref 0.0–0.2)
nRBC: 0.3 % — ABNORMAL HIGH (ref 0.0–0.2)
nRBC: 0.5 % — ABNORMAL HIGH (ref 0.0–0.2)

## 2021-08-01 LAB — PROTIME-INR
INR: 1.6 — ABNORMAL HIGH (ref 0.8–1.2)
INR: 1.7 — ABNORMAL HIGH (ref 0.8–1.2)
Prothrombin Time: 18.7 seconds — ABNORMAL HIGH (ref 11.4–15.2)
Prothrombin Time: 19.5 seconds — ABNORMAL HIGH (ref 11.4–15.2)

## 2021-08-01 LAB — POCT ACTIVATED CLOTTING TIME
Activated Clotting Time: 317 seconds
Activated Clotting Time: 215 seconds
Activated Clotting Time: 257 seconds

## 2021-08-01 LAB — COMPREHENSIVE METABOLIC PANEL
ALT: 63 U/L — ABNORMAL HIGH (ref 0–44)
AST: 38 U/L (ref 15–41)
Albumin: 1.9 g/dL — ABNORMAL LOW (ref 3.5–5.0)
Alkaline Phosphatase: 49 U/L (ref 38–126)
Anion gap: 12 (ref 5–15)
BUN: 54 mg/dL — ABNORMAL HIGH (ref 8–23)
CO2: 19 mmol/L — ABNORMAL LOW (ref 22–32)
Calcium: 8.4 mg/dL — ABNORMAL LOW (ref 8.9–10.3)
Chloride: 102 mmol/L (ref 98–111)
Creatinine, Ser: 3.05 mg/dL — ABNORMAL HIGH (ref 0.61–1.24)
GFR, Estimated: 21 mL/min — ABNORMAL LOW (ref >60)
Glucose, Bld: 223 mg/dL — ABNORMAL HIGH (ref 70–99)
Potassium: 4.3 mmol/L (ref 3.5–5.1)
Sodium: 133 mmol/L — ABNORMAL LOW (ref 135–145)
Total Bilirubin: 3 mg/dL — ABNORMAL HIGH (ref 0.3–1.2)
Total Protein: 5.2 g/dL — ABNORMAL LOW (ref 6.5–8.1)

## 2021-08-01 LAB — GLUCOSE, CAPILLARY
Glucose-Capillary: 160 mg/dL — ABNORMAL HIGH (ref 70–99)
Glucose-Capillary: 224 mg/dL — ABNORMAL HIGH (ref 70–99)

## 2021-08-01 LAB — HEMOGLOBIN AND HEMATOCRIT, BLOOD
HCT: 37.6 % — ABNORMAL LOW (ref 39.0–52.0)
Hemoglobin: 12.5 g/dL — ABNORMAL LOW (ref 13.0–17.0)

## 2021-08-01 LAB — PREPARE RBC (CROSSMATCH)

## 2021-08-01 LAB — MAGNESIUM
Magnesium: 2.2 mg/dL (ref 1.7–2.4)
Magnesium: 2.2 mg/dL (ref 1.7–2.4)

## 2021-08-01 LAB — FIBRINOGEN: Fibrinogen: 558 mg/dL — ABNORMAL HIGH (ref 210–475)

## 2021-08-01 LAB — LACTIC ACID, PLASMA: Lactic Acid, Venous: 1.8 mmol/L (ref 0.5–1.9)

## 2021-08-01 LAB — APTT
aPTT: 33 seconds (ref 24–36)
aPTT: 40 seconds — ABNORMAL HIGH (ref 24–36)

## 2021-08-01 LAB — HEMOGLOBIN A1C
Hgb A1c MFr Bld: 5.5 % (ref 4.8–5.6)
Mean Plasma Glucose: 111.15 mg/dL

## 2021-08-01 SURGERY — INSERTION, ENDOVASCULAR STENT GRAFT, AORTA, THORACIC
Anesthesia: General | Site: Groin | Laterality: Right

## 2021-08-01 MED ORDER — DOCUSATE SODIUM 50 MG/5ML PO LIQD
100.0000 mg | Freq: Two times a day (BID) | ORAL | Status: DC
  Administered 2021-08-03 – 2021-08-05 (×4): 100 mg
  Filled 2021-08-01 (×5): qty 10

## 2021-08-01 MED ORDER — ONDANSETRON HCL 4 MG/2ML IJ SOLN: 4.0000 mg | Freq: Four times a day (QID) | INTRAMUSCULAR | Status: DC | PRN

## 2021-08-01 MED ORDER — 0.9 % SODIUM CHLORIDE (POUR BTL) OPTIME
TOPICAL | Status: DC | PRN
  Administered 2021-08-01: 1000 mL

## 2021-08-01 MED ORDER — CEFAZOLIN SODIUM-DEXTROSE 2-4 GM/100ML-% IV SOLN
2.0000 g | Freq: Two times a day (BID) | INTRAVENOUS | Status: AC
  Administered 2021-08-01 – 2021-08-02 (×2): 2 g via INTRAVENOUS
  Filled 2021-08-01 (×2): qty 100

## 2021-08-01 MED ORDER — SODIUM CHLORIDE 0.9 % IV SOLN: INTRAVENOUS | Status: DC | PRN

## 2021-08-01 MED ORDER — NOREPINEPHRINE 16 MG/250ML-% IV SOLN
0.0000 ug/min | INTRAVENOUS | Status: DC
  Administered 2021-08-01: 10 ug/min via INTRAVENOUS
  Administered 2021-08-02: 5 ug/min via INTRAVENOUS
  Filled 2021-08-01 (×2): qty 250

## 2021-08-01 MED ORDER — FENTANYL CITRATE PF 50 MCG/ML IJ SOSY: 25.0000 ug | PREFILLED_SYRINGE | INTRAMUSCULAR | Status: DC | PRN

## 2021-08-01 MED ORDER — EPINEPHRINE HCL 5 MG/250ML IV SOLN IN NS
0.5000 ug/min | INTRAVENOUS | Status: DC
  Administered 2021-08-01: 17 ug/min via INTRAVENOUS
  Administered 2021-08-02 (×6): 20 ug/min via INTRAVENOUS
  Administered 2021-08-03: 19 ug/min via INTRAVENOUS
  Administered 2021-08-03: 8 ug/min via INTRAVENOUS
  Administered 2021-08-03: 12 ug/min via INTRAVENOUS
  Filled 2021-08-01 (×12): qty 250

## 2021-08-01 MED ORDER — CALCIUM GLUCONATE-NACL 1-0.675 GM/50ML-% IV SOLN
1.0000 g | Freq: Once | INTRAVENOUS | Status: AC
  Administered 2021-08-01: 1000 mg via INTRAVENOUS
  Filled 2021-08-01: qty 50

## 2021-08-01 MED ORDER — SODIUM CHLORIDE 0.9 % IV SOLN: 10.0000 mL/h | Freq: Once | INTRAVENOUS | Status: DC

## 2021-08-01 MED ORDER — GUAIFENESIN-DM 100-10 MG/5ML PO SYRP
15.0000 mL | ORAL_SOLUTION | ORAL | Status: DC | PRN
  Filled 2021-08-01: qty 15

## 2021-08-01 MED ORDER — FENTANYL CITRATE (PF) 250 MCG/5ML IJ SOLN
INTRAMUSCULAR | Status: AC
  Filled 2021-08-01: qty 5

## 2021-08-01 MED ORDER — EPINEPHRINE 1 MG/10ML IJ SOSY
PREFILLED_SYRINGE | INTRAMUSCULAR | Status: AC
  Filled 2021-08-01: qty 10

## 2021-08-01 MED ORDER — LIDOCAINE 2% (20 MG/ML) 5 ML SYRINGE
INTRAMUSCULAR | Status: DC | PRN
  Administered 2021-08-01: 100 mg via INTRAVENOUS

## 2021-08-01 MED ORDER — MIDAZOLAM HCL 2 MG/2ML IJ SOLN
INTRAMUSCULAR | Status: AC
  Filled 2021-08-01: qty 2

## 2021-08-01 MED ORDER — SODIUM CHLORIDE 0.9% FLUSH: 10.0000 mL | INTRAVENOUS | Status: DC | PRN

## 2021-08-01 MED ORDER — POLYETHYLENE GLYCOL 3350 17 G PO PACK
17.0000 g | PACK | Freq: Every day | ORAL | Status: DC
  Administered 2021-08-03 – 2021-08-05 (×3): 17 g
  Filled 2021-08-01 (×3): qty 1

## 2021-08-01 MED ORDER — IODIXANOL 320 MG/ML IV SOLN: INTRAVENOUS | Status: DC | PRN

## 2021-08-01 MED ORDER — HEPARIN SODIUM (PORCINE) 1000 UNIT/ML IJ SOLN
INTRAMUSCULAR | Status: AC
  Filled 2021-08-01: qty 10

## 2021-08-01 MED ORDER — PHENYLEPHRINE 40 MCG/ML (10ML) SYRINGE FOR IV PUSH (FOR BLOOD PRESSURE SUPPORT)
80.0000 ug | PREFILLED_SYRINGE | Freq: Once | INTRAVENOUS | Status: AC | PRN
  Administered 2021-08-01: 100 ug via INTRAVENOUS
  Filled 2021-08-01: qty 10

## 2021-08-01 MED ORDER — SODIUM CHLORIDE 0.9 % IV BOLUS
1000.0000 mL | Freq: Once | INTRAVENOUS | Status: AC
  Administered 2021-08-01: 1000 mL via INTRAVENOUS

## 2021-08-01 MED ORDER — MIDAZOLAM HCL 2 MG/2ML IJ SOLN: 2.0000 mg | INTRAMUSCULAR | Status: DC | PRN

## 2021-08-01 MED ORDER — SODIUM BICARBONATE 8.4 % IV SOLN
100.0000 meq | Freq: Once | INTRAVENOUS | Status: AC
  Administered 2021-08-01: 100 meq via INTRAVENOUS
  Filled 2021-08-01: qty 100

## 2021-08-01 MED ORDER — ROCURONIUM BROMIDE 10 MG/ML (PF) SYRINGE
PREFILLED_SYRINGE | INTRAVENOUS | Status: AC
Start: 2021-08-01 — End: ?
  Filled 2021-08-01: qty 10

## 2021-08-01 MED ORDER — HEPARIN SODIUM (PORCINE) 1000 UNIT/ML IJ SOLN
INTRAMUSCULAR | Status: AC
  Filled 2021-08-01: qty 1

## 2021-08-01 MED ORDER — ORAL CARE MOUTH RINSE
15.0000 mL | OROMUCOSAL | Status: DC
  Administered 2021-08-01 – 2021-08-04 (×25): 15 mL via OROMUCOSAL

## 2021-08-01 MED ORDER — NOREPINEPHRINE 4 MG/250ML-% IV SOLN
0.0000 ug/min | INTRAVENOUS | Status: DC
  Administered 2021-08-01 (×2): 40 ug/min via INTRAVENOUS
  Administered 2021-08-01: 25 ug/min via INTRAVENOUS
  Filled 2021-08-01: qty 250
  Filled 2021-08-01: qty 750
  Filled 2021-08-01: qty 250

## 2021-08-01 MED ORDER — ONDANSETRON HCL 4 MG/2ML IJ SOLN: 4.0000 mg | Freq: Once | INTRAMUSCULAR | Status: DC | PRN

## 2021-08-01 MED ORDER — SODIUM CHLORIDE 0.9% IV SOLUTION: Freq: Once | INTRAVENOUS | Status: DC

## 2021-08-01 MED ORDER — PHENYLEPHRINE HCL-NACL 20-0.9 MG/250ML-% IV SOLN: INTRAVENOUS | Status: DC | PRN

## 2021-08-01 MED ORDER — SUCCINYLCHOLINE CHLORIDE 200 MG/10ML IV SOSY
PREFILLED_SYRINGE | INTRAVENOUS | Status: AC
  Filled 2021-08-01: qty 10

## 2021-08-01 MED ORDER — CALCIUM CHLORIDE 10 % IV SOLN: 1.0000 g | Freq: Once | INTRAVENOUS | Status: DC

## 2021-08-01 MED ORDER — CEFAZOLIN SODIUM-DEXTROSE 2-3 GM-%(50ML) IV SOLR
INTRAVENOUS | Status: DC | PRN
  Administered 2021-08-01: 2 g via INTRAVENOUS

## 2021-08-01 MED ORDER — ETOMIDATE 2 MG/ML IV SOLN
INTRAVENOUS | Status: DC | PRN
  Administered 2021-08-01: 16 mg via INTRAVENOUS

## 2021-08-01 MED ORDER — ACETAMINOPHEN 10 MG/ML IV SOLN: 1000.0000 mg | Freq: Once | INTRAVENOUS | Status: DC | PRN

## 2021-08-01 MED ORDER — DOCUSATE SODIUM 100 MG PO CAPS: 100.0000 mg | ORAL_CAPSULE | Freq: Every day | ORAL | Status: DC

## 2021-08-01 MED ORDER — LABETALOL HCL 5 MG/ML IV SOLN
10.0000 mg | INTRAVENOUS | Status: DC | PRN
Start: 2021-08-01 — End: 2021-08-04

## 2021-08-01 MED ORDER — HEPARIN SODIUM (PORCINE) 1000 UNIT/ML IJ SOLN
INTRAMUSCULAR | Status: DC | PRN
  Administered 2021-08-01: 3000 [IU] via INTRAVENOUS
  Administered 2021-08-01: 8000 [IU] via INTRAVENOUS

## 2021-08-01 MED ORDER — HYDRALAZINE HCL 20 MG/ML IJ SOLN: 5.0000 mg | INTRAMUSCULAR | Status: DC | PRN

## 2021-08-01 MED ORDER — ATROPINE SULFATE 0.4 MG/ML IV SOLN
INTRAVENOUS | Status: AC
  Filled 2021-08-01: qty 1

## 2021-08-01 MED ORDER — FENTANYL BOLUS VIA INFUSION
50.0000 ug | INTRAVENOUS | Status: DC | PRN
  Administered 2021-08-01 (×2): 50 ug via INTRAVENOUS
  Administered 2021-08-01 (×2): 100 ug via INTRAVENOUS
  Administered 2021-08-02 (×7): 50 ug via INTRAVENOUS
  Administered 2021-08-03 (×2): 100 ug via INTRAVENOUS
  Filled 2021-08-01: qty 100

## 2021-08-01 MED ORDER — EPINEPHRINE PF 1 MG/ML IJ SOLN
INTRAMUSCULAR | Status: DC | PRN
  Administered 2021-08-01: 2 ug/min via INTRAVENOUS

## 2021-08-01 MED ORDER — ALBUMIN HUMAN 5 % IV SOLN: INTRAVENOUS | Status: DC | PRN

## 2021-08-01 MED ORDER — IODIXANOL 320 MG/ML IV SOLN
INTRAVENOUS | Status: DC | PRN
Start: 2021-08-01 — End: 2021-08-01

## 2021-08-01 MED ORDER — PHENOL 1.4 % MT LIQD: 1.0000 | OROMUCOSAL | Status: DC | PRN

## 2021-08-01 MED ORDER — SODIUM CHLORIDE 0.9% IV SOLUTION: Freq: Once | INTRAVENOUS | Status: AC

## 2021-08-01 MED ORDER — SUCCINYLCHOLINE CHLORIDE 200 MG/10ML IV SOSY
PREFILLED_SYRINGE | INTRAVENOUS | Status: DC | PRN
  Administered 2021-08-01: 140 mg via INTRAVENOUS

## 2021-08-01 MED ORDER — POTASSIUM CHLORIDE CRYS ER 20 MEQ PO TBCR: 20.0000 meq | EXTENDED_RELEASE_TABLET | Freq: Every day | ORAL | Status: DC | PRN

## 2021-08-01 MED ORDER — NOREPINEPHRINE 4 MG/250ML-% IV SOLN
INTRAVENOUS | Status: DC | PRN
  Administered 2021-08-01: 2 ug/min via INTRAVENOUS

## 2021-08-01 MED ORDER — PROPOFOL 1000 MG/100ML IV EMUL
0.0000 ug/kg/min | INTRAVENOUS | Status: DC
  Administered 2021-08-01: 5 ug/kg/min via INTRAVENOUS
  Administered 2021-08-02 – 2021-08-03 (×3): 10 ug/kg/min via INTRAVENOUS
  Filled 2021-08-01 (×4): qty 100

## 2021-08-01 MED ORDER — IOHEXOL 350 MG/ML SOLN
75.0000 mL | Freq: Once | INTRAVENOUS | Status: AC | PRN
  Administered 2021-08-01: 75 mL via INTRAVENOUS

## 2021-08-01 MED ORDER — MIDAZOLAM HCL 2 MG/2ML IJ SOLN
INTRAMUSCULAR | Status: DC | PRN
  Administered 2021-08-01: 1 mg via INTRAVENOUS

## 2021-08-01 MED ORDER — VASOPRESSIN 20 UNITS/100 ML INFUSION FOR SHOCK
0.0000 [IU]/min | INTRAVENOUS | Status: DC
  Administered 2021-08-01 – 2021-08-04 (×6): 0.03 [IU]/min via INTRAVENOUS
  Filled 2021-08-01 (×6): qty 100

## 2021-08-01 MED ORDER — PHENYLEPHRINE HCL-NACL 20-0.9 MG/250ML-% IV SOLN
INTRAVENOUS | Status: DC | PRN
  Administered 2021-08-01: 50 ug/min via INTRAVENOUS

## 2021-08-01 MED ORDER — MAGNESIUM SULFATE 2 GM/50ML IV SOLN: 2.0000 g | Freq: Every day | INTRAVENOUS | Status: DC | PRN

## 2021-08-01 MED ORDER — PROTAMINE SULFATE 10 MG/ML IV SOLN
INTRAVENOUS | Status: AC
  Filled 2021-08-01: qty 5

## 2021-08-01 MED ORDER — PROPOFOL 1000 MG/100ML IV EMUL
INTRAVENOUS | Status: AC
  Filled 2021-08-01: qty 100

## 2021-08-01 MED ORDER — CALCIUM CHLORIDE 10 % IV SOLN
INTRAVENOUS | Status: DC | PRN
Start: 2021-08-01 — End: 2021-08-01
  Administered 2021-08-01 (×2): 500 mg via INTRAVENOUS

## 2021-08-01 MED ORDER — PANTOPRAZOLE SODIUM 40 MG PO TBEC: 40.0000 mg | DELAYED_RELEASE_TABLET | Freq: Every day | ORAL | Status: DC

## 2021-08-01 MED ORDER — LACTATED RINGERS IV BOLUS
250.0000 mL | Freq: Once | INTRAVENOUS | Status: AC
  Administered 2021-08-01: 250 mL via INTRAVENOUS

## 2021-08-01 MED ORDER — FENTANYL 2500MCG IN NS 250ML (10MCG/ML) PREMIX INFUSION
50.0000 ug/h | INTRAVENOUS | Status: DC
  Administered 2021-08-01: 50 ug/h via INTRAVENOUS
  Administered 2021-08-02 – 2021-08-04 (×3): 100 ug/h via INTRAVENOUS
  Filled 2021-08-01 (×5): qty 250

## 2021-08-01 MED ORDER — INSULIN ASPART 100 UNIT/ML IJ SOLN
0.0000 [IU] | INTRAMUSCULAR | Status: DC
  Administered 2021-08-01: 3 [IU] via SUBCUTANEOUS
  Administered 2021-08-01: 5 [IU] via SUBCUTANEOUS
  Administered 2021-08-02: 2 [IU] via SUBCUTANEOUS

## 2021-08-01 MED ORDER — POLYETHYLENE GLYCOL 3350 17 G PO PACK: 17.0000 g | PACK | Freq: Every day | ORAL | Status: DC

## 2021-08-01 MED ORDER — SODIUM CHLORIDE 0.9% FLUSH
10.0000 mL | Freq: Two times a day (BID) | INTRAVENOUS | Status: DC
  Administered 2021-08-01 – 2021-08-02 (×2): 10 mL
  Administered 2021-08-02: 20 mL
  Administered 2021-08-03 – 2021-08-07 (×9): 10 mL

## 2021-08-01 MED ORDER — DOCUSATE SODIUM 50 MG/5ML PO LIQD: 100.0000 mg | Freq: Two times a day (BID) | ORAL | Status: DC

## 2021-08-01 MED ORDER — PROTAMINE SULFATE 10 MG/ML IV SOLN
INTRAVENOUS | Status: DC | PRN
  Administered 2021-08-01: 50 mg via INTRAVENOUS

## 2021-08-01 MED ORDER — DEXMEDETOMIDINE HCL IN NACL 400 MCG/100ML IV SOLN: 0.4000 ug/kg/h | INTRAVENOUS | Status: DC

## 2021-08-01 MED ORDER — FENTANYL CITRATE PF 50 MCG/ML IJ SOSY
50.0000 ug | PREFILLED_SYRINGE | Freq: Once | INTRAMUSCULAR | Status: AC
  Administered 2021-08-01: 50 ug via INTRAVENOUS

## 2021-08-01 MED ORDER — PHENYLEPHRINE CONCENTRATED 100MG/250ML (0.4 MG/ML) INFUSION SIMPLE
0.0000 ug/min | INTRAVENOUS | Status: DC
  Filled 2021-08-01: qty 250

## 2021-08-01 MED ORDER — OXYCODONE HCL 5 MG PO TABS: 5.0000 mg | ORAL_TABLET | Freq: Four times a day (QID) | ORAL | Status: DC | PRN

## 2021-08-01 MED ORDER — CHLORHEXIDINE GLUCONATE 0.12% ORAL RINSE (MEDLINE KIT)
15.0000 mL | Freq: Two times a day (BID) | OROMUCOSAL | Status: DC
  Administered 2021-08-01 – 2021-08-04 (×7): 15 mL via OROMUCOSAL

## 2021-08-01 MED ORDER — METOPROLOL TARTRATE 5 MG/5ML IV SOLN: 2.0000 mg | INTRAVENOUS | Status: DC | PRN

## 2021-08-01 MED ORDER — HYDROMORPHONE HCL 1 MG/ML IJ SOLN: 0.2500 mg | INTRAMUSCULAR | Status: DC | PRN

## 2021-08-01 MED ORDER — HEPARIN SODIUM (PORCINE) 5000 UNIT/ML IJ SOLN
5000.0000 [IU] | Freq: Three times a day (TID) | INTRAMUSCULAR | Status: DC
  Administered 2021-08-02 – 2021-08-11 (×29): 5000 [IU] via SUBCUTANEOUS
  Filled 2021-08-01 (×29): qty 1

## 2021-08-01 MED ORDER — ROCURONIUM BROMIDE 10 MG/ML (PF) SYRINGE
PREFILLED_SYRINGE | INTRAVENOUS | Status: DC | PRN
  Administered 2021-08-01: 70 mg via INTRAVENOUS
  Administered 2021-08-01 (×2): 30 mg via INTRAVENOUS

## 2021-08-01 MED ORDER — LACTATED RINGERS IV SOLN: INTRAVENOUS | Status: DC | PRN

## 2021-08-01 MED ORDER — SODIUM CHLORIDE 0.9 % IV SOLN: 500.0000 mL | Freq: Once | INTRAVENOUS | Status: DC | PRN

## 2021-08-01 MED ORDER — ALBUMIN HUMAN 25 % IV SOLN
12.5000 g | Freq: Once | INTRAVENOUS | Status: AC
  Administered 2021-08-01: 12.5 g via INTRAVENOUS
  Filled 2021-08-01: qty 50

## 2021-08-01 MED ORDER — IODIXANOL 320 MG/ML IV SOLN
INTRAVENOUS | Status: DC | PRN
  Administered 2021-08-01: 257.3 mL

## 2021-08-01 MED ORDER — SODIUM CHLORIDE 0.9 % BOLUS PEDS: 1000.0000 mL | Freq: Once | INTRAVENOUS | Status: AC

## 2021-08-01 MED ORDER — HEPARIN 6000 UNIT IRRIGATION SOLUTION
Status: DC | PRN
  Administered 2021-08-01: 1

## 2021-08-01 MED ORDER — ALUM & MAG HYDROXIDE-SIMETH 200-200-20 MG/5ML PO SUSP: 15.0000 mL | ORAL | Status: DC | PRN

## 2021-08-01 SURGICAL SUPPLY — 68 items
ADH SKN CLS APL DERMABOND .7 (GAUZE/BANDAGES/DRESSINGS) ×4
BAG COUNTER SPONGE SURGICOUNT (BAG) ×3 IMPLANT
BAG SPNG CNTER NS LX DISP (BAG) ×2
CANISTER SUCT 3000ML PPV (MISCELLANEOUS) ×3 IMPLANT
CATH ACCU-VU SIZ PIG 5F 100CM (CATHETERS) ×1 IMPLANT
CATH BEACON 5 .035 65 KMP TIP (CATHETERS) ×1 IMPLANT
CATH VISIONS PV .035 IVUS (CATHETERS) ×1 IMPLANT
CLOSURE PERCLOSE PROSTYLE (VASCULAR PRODUCTS) ×3 IMPLANT
COVER PROBE W GEL 5X96 (DRAPES) ×3 IMPLANT
DERMABOND ADVANCED (GAUZE/BANDAGES/DRESSINGS) ×2
DERMABOND ADVANCED .7 DNX12 (GAUZE/BANDAGES/DRESSINGS) ×2 IMPLANT
DEVICE CLOSURE PERCLS PRGLD 6F (VASCULAR PRODUCTS) IMPLANT
DEVICE TORQUE KENDALL .025-038 (MISCELLANEOUS) ×1 IMPLANT
DRAPE BRACHIAL (DRAPES) ×1 IMPLANT
DRSG TEGADERM 2-3/8X2-3/4 SM (GAUZE/BANDAGES/DRESSINGS) ×3 IMPLANT
DRYSEAL FLEXSHEATH 22FR 33CM (SHEATH) ×1
ELECT CAUTERY BLADE 6.4 (BLADE) ×1 IMPLANT
ELECT REM PT RETURN 9FT ADLT (ELECTROSURGICAL) ×6
ELECTRODE REM PT RTRN 9FT ADLT (ELECTROSURGICAL) ×4 IMPLANT
EXCLDR EXT ENDO 23X4.5 15F (Endovascular Graft) ×3 IMPLANT
EXCLDR EXT ENDO 26X4.5 15F (Endovascular Graft) ×3 IMPLANT
EXCLUDER EXT ENDO 23X4.5 15F (Endovascular Graft) IMPLANT
EXCLUDER EXT ENDO 26X4.5 15F (Endovascular Graft) IMPLANT
GAUZE 4X4 16PLY ~~LOC~~+RFID DBL (SPONGE) ×1 IMPLANT
GLOVE SRG 8 PF TXTR STRL LF DI (GLOVE) ×2 IMPLANT
GLOVE SURG ENC MOIS LTX SZ7.5 (GLOVE) ×3 IMPLANT
GLOVE SURG UNDER POLY LF SZ8 (GLOVE) ×3
GOWN STRL REUS W/ TWL LRG LVL3 (GOWN DISPOSABLE) ×6 IMPLANT
GOWN STRL REUS W/ TWL XL LVL3 (GOWN DISPOSABLE) ×2 IMPLANT
GOWN STRL REUS W/TWL LRG LVL3 (GOWN DISPOSABLE) ×9
GOWN STRL REUS W/TWL XL LVL3 (GOWN DISPOSABLE) ×3
GRAFT BALLN CATH 65CM (STENTS) IMPLANT
GUIDEWIRE ANGLED .035X260CM (WIRE) ×1 IMPLANT
KIT BASIN OR (CUSTOM PROCEDURE TRAY) ×3 IMPLANT
KIT TURNOVER KIT B (KITS) ×3 IMPLANT
NDL PERC 18GX7CM (NEEDLE) ×2 IMPLANT
NEEDLE PERC 18GX7CM (NEEDLE) ×3 IMPLANT
NS IRRIG 1000ML POUR BTL (IV SOLUTION) ×6 IMPLANT
PACK ENDOVASCULAR (PACKS) ×3 IMPLANT
PAD ARMBOARD 7.5X6 YLW CONV (MISCELLANEOUS) ×6 IMPLANT
PERCLOSE PROGLIDE 6F (VASCULAR PRODUCTS)
SET MICROPUNCTURE 5F STIFF (MISCELLANEOUS) ×2 IMPLANT
SHEATH AVANTI 11CM 8FR (SHEATH) IMPLANT
SHEATH DRYSEAL FLEX 22FR 33CM (SHEATH) IMPLANT
SHEATH PINNACLE 8F 10CM (SHEATH) ×1 IMPLANT
SLEEVE ISOL F/PACE RF HD COVER (MISCELLANEOUS) ×1 IMPLANT
SPONGE T-LAP 18X18 ~~LOC~~+RFID (SPONGE) ×2 IMPLANT
STAPLER VISISTAT 35W (STAPLE) IMPLANT
STENT GRAFT BALLN CATH 65CM (STENTS) ×1
STENT GRFT THORAC ACS 34X34X10 (Endovascular Graft) ×1 IMPLANT
STENT THORAC ACS 34X34X20 (Endovascular Graft) ×2 IMPLANT
STOPCOCK MORSE 400PSI 3WAY (MISCELLANEOUS) ×3 IMPLANT
SUT ETHILON 3 0 PS 1 (SUTURE) IMPLANT
SUT MNCRL AB 4-0 PS2 18 (SUTURE) ×7 IMPLANT
SUT PROLENE 5 0 C 1 24 (SUTURE) IMPLANT
SUT VIC AB 2-0 CTX 36 (SUTURE) IMPLANT
SUT VIC AB 3-0 SH 27 (SUTURE)
SUT VIC AB 3-0 SH 27X BRD (SUTURE) IMPLANT
SYR 30ML LL (SYRINGE) IMPLANT
SYR 50ML LL SCALE MARK (SYRINGE) ×3 IMPLANT
SYR MEDRAD MARK V 150ML (SYRINGE) ×1 IMPLANT
TAPE STRIPS DRAPE STRL (GAUZE/BANDAGES/DRESSINGS) ×1 IMPLANT
TOWEL GREEN STERILE (TOWEL DISPOSABLE) ×3 IMPLANT
TRAY FOLEY MTR SLVR 16FR STAT (SET/KITS/TRAYS/PACK) ×3 IMPLANT
TUBING HIGH PRESSURE 120CM (CONNECTOR) ×3 IMPLANT
WIRE BENTSON .035X145CM (WIRE) ×1 IMPLANT
WIRE STIFF LUNDERQUIST 260CM (WIRE) ×1 IMPLANT
WIRE TORQFLEX AUST .018X40CM (WIRE) ×1 IMPLANT

## 2021-08-01 NOTE — Progress Notes (Addendum)
I have reviewed Edward Hopkins CTA.  This shows that his dissection is ruptured in the chest and he also has extended the dissection throughout the visceral segment with evidence of significant visceral malperfusion.  I discussed the grave prognosis and quoted him a greater than 90% risk of mortality.  Discussed almost certainly will require dialysis postoperative and will be at high risk for gut malperfusion in addition to multisystem organ failure and paralysis.  He asked that I talk to his significant other Edward Hopkins who I have called on the phone and updated.  Emergent consent with plans for thoracic stent graft and IVUS. ?  ?Marty Heck, MD ?[facility] ?Office: [phone] ? ? ?Marty Heck ? ? ?

## 2021-08-01 NOTE — Progress Notes (Addendum)
eLink Physician-Brief Progress Note ?Patient Name: Edward Hopkins ?DOB: 1950-12-18 ?MRN: [id number] ? ? ?Date of Service ? 08/01/2021  ?HPI/Events of Note ? Hypotension - BP = 66/43 via A-line and 83/66 via cuff. CVP = 12. Vasopressor requirement continue to increase. Norepinephrine IV infusion now at 55 mcg/min and Epinephrine now at 17 mcg/min.   ?eICU Interventions ? Plan: ?25% Albumin 12.5 gm IV now. ?Vasopressin IV infusion at shock dose.  ?Repeat H/H at 12 midnight. ?PCCM ground team requested to evaluate the patient at bedside.   ? ? ? ?Intervention Category ?Major Interventions: Hypotension - evaluation and management ? ?Edward Hopkins ?08/01/2021, 11:12 PM ?
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
## 2021-08-01 NOTE — Anesthesia Procedure Notes (Signed)
Anesthesia Procedure Image    

## 2021-08-01 NOTE — Progress Notes (Signed)
Critical care re-consulted this morning for acute onset hypotension and chest pain.  ?-CTA aorta ordered to evaluate for progression of aortic dissection. Risk of contrast related renal injury that could result in need for dialysis discussed with him. He agrees to proceed with CTA. ?-consult note to follow. ?

## 2021-08-01 NOTE — Progress Notes (Signed)
eLink Physician-Brief Progress Note ?Patient Name: MACKEY VARRICCHIO ?DOB: [birth year] ?MRN: [id number] ? ? ?Date of Service ? 08/01/2021  ?HPI/Events of Note ? Hyperglycemia - Blood glucose = 224.   ?eICU Interventions ? Plan: ?Q 4 hours moderate Novolog SSI.  ? ? ? ?Intervention Category ?Major Interventions: Hyperglycemia - active titration of insulin therapy ? ?Clemmie Buelna Cornelia Copa ?08/01/2021, 8:46 PM ?

## 2021-08-01 NOTE — Anesthesia Postprocedure Evaluation (Signed)
Anesthesia Post Note ? ?Patient: JOANATHAN AFFELDT ? ?Procedure(s) Performed: THORACIC AORTIC ENDOVASCULAR STENT GRAFT WITH ABDOMINAL AND ARCH AORTAGRAM ?ULTRASOUND GUIDANCE FOR VASCULAR ACCESS OF RIGHT COMMON FEMORAL ARTERY (Right: Groin) ?ABDOMINAL AORTIC ENDOVASCULAR STENT GRAFT ?INTRAVASCULAR ULTRASOUND OF AORTIC ARCH TO RIGHT ILIAC ARTERY ? ?  ? ?Patient location during evaluation: SICU ?Anesthesia Type: General ?Level of consciousness: sedated ?Pain management: pain level controlled ?Vital Signs Assessment: post-procedure vital signs reviewed and stable ?Respiratory status: patient remains intubated per anesthesia plan ?Cardiovascular status: stable ?Postop Assessment: no apparent nausea or vomiting ?Anesthetic complications: no ? ? ?No notable events documented. ? ?Last Vitals:  ?Vitals:  ? 08/01/21 1510 08/01/21 1600  ?BP: 117/72 (!) 132/92  ?Pulse: 70 73  ?Resp: 18 18  ?Temp:  (!) 36.3 ?C  ?SpO2: 98%   ?  ?Last Pain:  ?Vitals:  ? 08/01/21 1600  ?TempSrc: Oral  ?PainSc:   ? ? ?  ?  ?  ?  ?  ?  ? ?Odeal Welden S ? ? ? ? ?

## 2021-08-01 NOTE — Progress Notes (Signed)
Went to pt room pt complaining of back pain than started complaining of chest pain. BP 138/89. Gave pt PRN oxy 15mg  and notified MD for further orders. ? ?Phoebe Sharps, RN ? ?

## 2021-08-01 NOTE — Anesthesia Procedure Notes (Signed)
Procedure Name: Intubation ?Date/Time: 08/01/2021 12:05 PM ?Performed by: Vonna Drafts, CRNA ?Pre-anesthesia Checklist: Patient identified, Emergency Drugs available, Suction available and Patient being monitored ?Patient Re-evaluated:Patient Re-evaluated prior to induction ?Oxygen Delivery Method: Circle system utilized ?Preoxygenation: Pre-oxygenation with 100% oxygen ?Induction Type: IV induction, Rapid sequence and Cricoid Pressure applied ?Tube type: Oral ?Tube size: 7.5 mm ?Number of attempts: 1 ?Airway Equipment and Method: Stylet and Oral airway ?Placement Confirmation: ETT inserted through vocal cords under direct vision, positive ETCO2 and breath sounds checked- equal and bilateral ?Secured at: 24 cm ?Tube secured with: Tape ?Dental Injury: Teeth and Oropharynx as per pre-operative assessment  ? ? ? ? ?
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
## 2021-08-01 NOTE — Progress Notes (Signed)
eLink Physician-Brief Progress Note ?Patient Name: Edward Hopkins ?DOB: 1950/11/10 ?MRN: [id number] ? ? ?Date of Service ? 08/01/2021  ?HPI/Events of Note ? Nursing concern for Hypotension requiring and increasing need for Vasopressors.   ?eICU Interventions ? Plan: ?CBC STAT. ?Monitor CVP now and Q 4 hours. ?ABG STAT. ?Increase ceiling on Norepinephrine IV infusion to 60 mcg/min.  ? ? ? ?Intervention Category ?Major Interventions: Hypotension - evaluation and management ? ?Trevon Strothers Cornelia Copa ?08/01/2021, 7:50 PM ?

## 2021-08-01 NOTE — Progress Notes (Addendum)
eLink Physician-Brief Progress Note ?Patient Name: ZUHAIR LARICCIA ?DOB: Apr 02, 1951 ?MRN: [id number] ? ? ?Date of Service ? 08/01/2021  ?HPI/Events of Note ? ABG on 50%/PRVC 18/TV 560/P 5 = 7.40/32/79/20.4. Hgb from ABG = 13.3. CVP = 6-8. EKG reveals sinus rhythm with short PR. Right bundle branch block. Left anterior fascicular block (Bifascicular block). Anteroseptal infarct , age undetermined.   ?eICU Interventions ? Plan: ?Bolus with 0.9 NaCl 1 liter IV over 1 hour now.   ? ? ? ?Intervention Category ?Major Interventions: Hypotension - evaluation and management ? ?Jagjit Riner Cornelia Copa ?08/01/2021, 8:10 PM ?

## 2021-08-01 NOTE — Progress Notes (Signed)
?PROGRESS NOTE ? ? ? ?CRISTIN SZATKOWSKI  DJT:[id number] DOB: Mar 26, 1951 DOA: 07/27/2021 ?PCP: Pcp, No  ? ? ?Brief Narrative:  ?71 yo M PMH HTN, Hep C, polysubstance abuse including ongoing cocaine use, Aortic dissection s/p thoracic aortic graft repair, thoracic aortic aneurysm presented to [facility] 3/15 with CC chest pain and back pain. Chest pain started 3/14 and progressed. Pt states it feels like something is squishing his heart and his back hurts. CTA c/a/p acquired and revealed recurrent type B dissection with flap beginning distal to takeoff of L subclavian artery and extending to takeoff of SMA.  ?  ?Case was discussed with CVTS who recommended medical management.  ?  ?PCCM consulted for admission in this setting  ? ?3/18 TRH pickup ? ?3/19 creatinine getting worse ?3/20 this am nsg reported pt c/o chest discomfort, thrashing around, pulled iv line out. When saw pt, pt sbp 80's , rechecked manually sbp in 80's. Ivf was stopped this am when iv line was pulled out by the patient.  Oxycodone was given earlier for chest discomfort. Written orders for LR bolus. Iv nsg came to place iv line. Sbp in 60's. He doesn't c/o dizziness. Only chest discomfort and feels sob. Notified, CTS, vascular and PCCM , wanted pccm to evaluate pt for transfer back to ICU. Plan for repeat CTA. Vascular and pccm at bedside now. ? ?Consultants:  ?PCCM ? ?Procedures: CT ? ?Antimicrobials:  ?  ? ? ?Subjective: ? Events as above ? ? ?Objective: ?Vitals:  ? 08/01/21 0933 08/01/21 0945 08/01/21 1000 08/01/21 1007  ?BP: (!) 86/59 (!) 64/56 (!) 63/50 (!) 60/40  ?Pulse: 75  75   ?Resp:      ?Temp:      ?TempSrc:      ?SpO2: 98%  100%   ?Weight:      ?Height:      ? ? ?Intake/Output Summary (Last 24 hours) at 08/01/2021 1138 ?Last data filed at 08/01/2021 0400 ?Gross per 24 hour  ?Intake 1622.44 ml  ?Output 800 ml  ?Net 822.44 ml  ? ?Filed Weights  ? 07/29/21 1648 07/31/21 0416 08/01/21 0451  ?Weight: 87.3 kg 83.6 kg 84 kg  ? ? ?Examination: ?Appears  nervous ?Decrease bs , no wheezing ?Reg, s1/s/2 no new murmurs. No gallops ?Soft benign nt/nd ?Trace pedal edema LE ?LUE swelling down ?Grossly inatct ? ? ? ?Data Reviewed: I have personally reviewed following labs and imaging studies ? ?CBC: ?Recent Labs  ?Lab 07/27/21 ?1032 07/27/21 ?1055 07/28/21 ?0336 07/29/21 ?0615 07/30/21 ?0230 07/31/21 ?0138 08/01/21 ?0426  ?WBC 5.4  --  6.2 9.2 11.7* 9.8 7.0  ?NEUTROABS 3.3  --   --   --   --   --   --   ?HGB 13.6   < > 10.8* 12.8* 12.3* 11.5* 11.2*  ?HCT 41.3   < > 32.9* 38.2* 36.1* 34.9* 34.2*  ?MCV 89.8  --  88.9 88.8 86.6 87.0 87.9  ?PLT 115*  --  102* 102* 120* 142* PLATELET CLUMPS NOTED ON SMEAR, UNABLE TO ESTIMATE  ? < > = values in this interval not displayed.  ? ?Basic Metabolic Panel: ?Recent Labs  ?Lab 07/27/21 ?1032 07/27/21 ?1055 07/28/21 ?0336 07/29/21 ?0615 07/30/21 ?0230 07/31/21 ?0138 08/01/21 ?0426  ?NA 137   < > 135 136 140 139 137  ?K 4.1   < > 4.2 4.7 4.4 4.4 3.9  ?CL 101   < > 103 105 106 108 104  ?CO2 24  --  24 22  23 22 23   ?GLUCOSE 97   < > 100* 132* 125* 109* 86  ?BUN 21   < > 22 32* 32* 42* 49*  ?CREATININE 1.27*   < > 1.55* 1.95* 2.18* 2.65* 2.61*  ?CALCIUM 9.4  --  8.8* 8.8* 9.0 8.9 8.5*  ?MG 2.0  --   --   --   --   --   --   ? < > = values in this interval not displayed.  ? ?GFR: ?Estimated Creatinine Clearance: 25.2 mL/min (A) (by C-G formula based on SCr of 2.61 mg/dL (H)). ?Liver Function Tests: ?Recent Labs  ?Lab 07/27/21 ?1032 07/28/21 ?0336 07/29/21 ?0615 07/30/21 ?0230 07/31/21 ?0926  ?AST 19 14* 66* 189* 106*  ?ALT 14 11 27  137* 138*  ?ALKPHOS 62 54 56 57 52  ?BILITOT 1.1 1.0 1.1 1.1 0.8  ?PROT 7.6 6.4* 6.7 6.7 6.6  ?ALBUMIN 3.5 2.9* 2.8* 2.6* 2.3*  ? ?Recent Labs  ?Lab 07/27/21 ?1032  ?LIPASE 40  ? ?No results for input(s): AMMONIA in the last 168 hours. ?Coagulation Profile: ?Recent Labs  ?Lab 07/27/21 ?1032  ?INR 1.1  ? ?Cardiac Enzymes: ?No results for input(s): CKTOTAL, CKMB, CKMBINDEX, TROPONINI in the last 168 hours. ?BNP (last  3 results) ?No results for input(s): PROBNP in the last 8760 hours. ?HbA1C: ?No results for input(s): HGBA1C in the last 72 hours. ?CBG: ?No results for input(s): GLUCAP in the last 168 hours. ?Lipid Profile: ?No results for input(s): CHOL, HDL, LDLCALC, TRIG, CHOLHDL, LDLDIRECT in the last 72 hours. ?Thyroid Function Tests: ?No results for input(s): TSH, T4TOTAL, FREET4, T3FREE, THYROIDAB in the last 72 hours. ?Anemia Panel: ?No results for input(s): VITAMINB12, FOLATE, FERRITIN, TIBC, IRON, RETICCTPCT in the last 72 hours. ?Sepsis Labs: ?No results for input(s): PROCALCITON, LATICACIDVEN in the last 168 hours. ? ?Recent Results (from the past 240 hour(s))  ?MRSA Next Gen by PCR, Nasal     Status: None  ? Collection Time: 07/27/21  6:50 PM  ? Specimen: Nasal Mucosa; Nasal Swab  ?Result Value Ref Range Status  ? MRSA by PCR Next Gen NOT DETECTED NOT DETECTED Final  ?  Comment: (NOTE) ?The GeneXpert MRSA Assay (FDA approved for NASAL specimens only), ?is one component of a comprehensive MRSA colonization surveillance ?program. It is not intended to diagnose MRSA infection nor to guide ?or monitor treatment for MRSA infections. ?Test performance is not FDA approved in patients less than 2 years ?old. ?Performed at [facility], [place] [address], [place], [place] ?[address] ?  ?  ? ? ? ? ? ?Radiology Studies: ?US RENAL ? ?Result Date: 07/31/2021 ?CLINICAL DATA:  Acute kidney injury. EXAM: RENAL / URINARY TRACT ULTRASOUND COMPLETE COMPARISON:  Abdominal ultrasound 12/24/2019; CTA chest abdomen and pelvis 07/27/2021 FINDINGS: Right Kidney: Renal measurements: 11.4 x 7.3 x 5.7 cm = volume: 146 mL. Mildly increased cortical echogenicity. Avascular cyst is seen within the lower pole of the right kidney measuring up to 5.1 cm similar to prior CT. Left Kidney: Renal measurements: 11.2 x 4.9 x 5.2 cm = volume: 149 mL. Mildly increased cortical echogenicity. Superior to the left kidney there is a hypoechoic round nodule  measuring up to 10 mm, corresponding to the benign splenule seen on prior CT in between the spleen in the upper pole of the left kidney. Bladder: Appears normal for degree of bladder distention. A left ureteral jet was seen but a right ureteral jet was not seen. Other: None. IMPRESSION:: IMPRESSION: 1. Avascular cyst  within the lower pole of the right kidney measuring up to 5.1 cm. 2. Mildly increased cortical echogenicity bilaterally, as can be seen with medical renal disease. 3. No hydronephrosis within either kidney. Electronically Signed   By: Yvonne Kendall M.D.   On: 07/31/2021 16:06  ? ?DG CHEST PORT 1 VIEW ? ?Result Date: 08/01/2021 ?CLINICAL DATA:  Shortness of breath EXAM: PORTABLE CHEST 1 VIEW COMPARISON:  Previous studies including the examination of 07/27/2021 FINDINGS: Thoracic aorta is tortuous and ectatic. Metallic sutures seen in the sternum. There are no signs of alveolar pulmonary edema. Increased markings are seen in both lower lung fields. There is poor inspiration. Left lateral CP angle is indistinct. There is no pneumothorax. IMPRESSION: Increased markings in the lower lung fields suggest atelectasis/pneumonia. Left lateral CP angle is indistinct, possibly suggesting small effusion. Electronically Signed   By: Elmer Picker M.D.   On: 08/01/2021 10:18   ? ? ? ? ? ?Scheduled Meds: ? sodium chloride   Intravenous Once  ? carvedilol  6.25 mg Oral BID WC  ? Chlorhexidine Gluconate Cloth  6 each Topical Daily  ? folic acid  1 mg Oral Daily  ? multivitamin with minerals  1 tablet Oral Daily  ? pantoprazole  40 mg Oral Daily  ? thiamine  100 mg Oral Daily  ? Or  ? thiamine  100 mg Intravenous Daily  ? ?Continuous Infusions: ? sodium chloride    ? lactated ringers 999 mL/hr at 08/01/21 1033  ? ? ?Assessment & Plan: ?  ?Principal Problem: ?  Aortic aneurysm with dissection ([place]) ? ? ?Recurrent type B aortic dissection due to hypertensive emergency- L subclavian artery to level of SMA takeoff.  Fenestration of dissection flap mid descending thoracic aorta ?S/p graft replacement of ascending thoracic aorta (2016)  ?-Medical management; appreciate TCTS management. No surgical intervention needed.  ?-He has b

## 2021-08-01 NOTE — Significant Event (Signed)
Rapid Response Event Note  ? ?Reason for Call :  ?Sever acute chest and back pain ?Hypotension ? ?Patient admitted with Type B aortic disection with medical management.  This am he had a sudden change in status. ? ?Initial Focused Assessment:  ?He is complaining of significant Chest and back pain.  He is clutching his chest. He is alert and oriented  ?Lung sounds are clear, heart tones regular. ? ?BP 64/56  HR 74  RR 28  O2 sat 100% on 6L [place] ?BP similar in both arms, automatic and manual correlate. ? ? ?Interventions:  ?12 lead EKG done ?IV team has just placed PIV via ultrasound  ?PCXR done ?LR bolus started   ?BP improved to 96/78 with fluids infusing, but patient's mentation became worse.  He is more fatigued and not as interactive as he was upon my arrival.   ? ?Whitney and Dr Tacy Learn with CCM at bedside to assess patient ?Dr Carlis Abbott VVS at bedside to assess patient ? ?Transported patient to Radiology for CTA chest abdomen and pelvis.   ? ?Transported to 2H10, staff at bedside to receive patient.   ?Patient complaining of sever pain in his chest and  he became nauseated BP 133/106 after he calmed BP 70/57 ?Intermittently difficult to obtain O2 sats, placed on NRB ? ?Total 2.5L fluid bolus given ?IV team at bedside placed 2nd PIV, labs drawn. ? ?OR staff at bedside to transport to OR ? ? ?Plan of Care:  ? ? ? ?Event Summary:  ? ?MD Notified: Dr Kurtis Bushman ?Call Time: [phone] ?Arrival Time: 26 ?End Time: 1200 ? ?Raliegh Ip, RN ?

## 2021-08-01 NOTE — Progress Notes (Signed)
Received phone call from staff member. Pt yelled out for help.  Pt Pain is increasing,BP dropped ,12 LED obtained and MD came to Bedside. IV team at bedside starting New IV line a 250ML bolus was started. Rapid response RN at bedside. ? ? ? 08/01/21 0933  ?Vitals  ?BP (!) 86/59  ?MAP (mmHg) 69  ?BP Location Left Arm  ?BP Method Automatic  ?Patient Position (if appropriate) Lying  ?Pulse Rate 75  ?Pulse Rate Source Monitor  ?ECG Heart Rate 74  ?Oxygen Therapy  ?SpO2 98 %  ?Pain Assessment  ?Pain Score 10  ?MEWS Score  ?MEWS Temp 0  ?MEWS Systolic 1  ?MEWS Pulse 0  ?MEWS RR 0  ?MEWS LOC 0  ?MEWS Score 1  ?MEWS Score Color Green  ? ? ?

## 2021-08-01 NOTE — Transfer of Care (Signed)
Immediate Anesthesia Transfer of Care Note ? ?Patient: Edward Hopkins ? ?Procedure(s) Performed: THORACIC AORTIC ENDOVASCULAR STENT GRAFT WITH ABDOMINAL AND ARCH AORTAGRAM ?ULTRASOUND GUIDANCE FOR VASCULAR ACCESS OF RIGHT COMMON FEMORAL ARTERY (Right: Groin) ?ABDOMINAL AORTIC ENDOVASCULAR STENT GRAFT ?INTRAVASCULAR ULTRASOUND OF AORTIC ARCH TO RIGHT ILIAC ARTERY ? ?Patient Location: ICU ? ?Anesthesia Type:General ? ?Level of Consciousness: Patient remains intubated per anesthesia plan ? ?Airway & Oxygen Therapy: Patient remains intubated per anesthesia plan and Patient placed on Ventilator (see vital sign flow sheet for setting) ? ?Post-op Assessment: Report given to RN ? ?Post vital signs: Reviewed and stable ? ?Last Vitals:  ?Vitals Value Taken Time  ?BP 120/73   ?Temp    ?Pulse 71 08/01/21 1507  ?Resp 14 08/01/21 1507  ?SpO2 99 % 08/01/21 1507  ?Vitals shown include unvalidated device data. ? ?Last Pain:  ?Vitals:  ? 08/01/21 0933  ?TempSrc:   ?PainSc: 10-Worst pain ever  ?   ? ?Patients Stated Pain Goal: 3 (07/31/21 0348) ? ?Complications: No notable events documented. ?

## 2021-08-01 NOTE — Progress Notes (Signed)
Pt BP continue to decrease. MD placed verbal consult for Critical care. Critical care team at bedside plan to Transfer pt and obtain CT. Rapid response at bedside for transport. [facility] RN given report   ? 08/01/21 0933 08/01/21 0945  ?Vitals  ?BP (!) 86/59 (!) 64/56  ?BP Location  --  Left Arm  ?BP Method  --  Manual  ?Patient Position (if appropriate)  --  Lying  ?Pulse Rate Source  --  Monitor  ?MEWS Score  ?MEWS Temp 0 0  ?MEWS Systolic 1 3  ?MEWS Pulse 0 0  ?MEWS RR 0 0  ?MEWS LOC 0 0  ?MEWS Score 1 3  ?MEWS Score Color Green Yellow  ? ? ?

## 2021-08-01 NOTE — Progress Notes (Signed)
?[facility] ?Progress Note  ? ? ?Assessment/ Plan:   ?Non-Oliguric AKI on CKD 3a: BL Crt 1.3. AKI likely 2/2 contrast associate kidney injury and large swings in BP w/ low Bps at time. Dissection involving renal arteries less likely given imaging on 3/15. Some bladder distention on US--no obstruction on u/s ?-Stopped norvasc, allowing BP to run slightly higher ?-serial bladder scans for the next 2 days to ensure voiding appropriately ?-Fortunately, Cr is stable/slightly better today, continue with supportive care ?-Continue to monitor daily Cr, Dose meds for GFR ?-Repeat UA 3/19 with microscopic hematuria (possibly related to cocaine/HTN), if cr does not improve/worsens then will consider ANCA testing to rule out cocaine induced vasculitis, as above: Cr is improving ?-Monitor Daily I/Os, Daily weight  ?-Maintain MAP>65 for optimal renal perfusion.  ?-Avoid nephrotoxic medications including NSAIDs ?-Use synthetic opioids (Fentanyl/Dilaudid) if needed ?-Currently no indication for HD ?  ?Chest pain/Aortic Dissection: Avoid cocaine. Continue coreg. Per primary ?  ?Hypertension: mostly related to cocaine. BP swinging low. Hold norasc ?  ?Cocaine use disorder: encouraged cessation, per primary service ?  ?Hep C: s/p treatment ?  ?LFT elevation: likely related to hypotension as well. Improving ?  ?Thrombocytopenia: HTN induced hemolysis likely. Improving as of yesterday ? ?Subjective:   ?Seen and examined bedside this AM. Has left sided chest pain today (intermittent issue), otherwise no other complaints. Norvasc d/c'ed yesterday, wide fluctuations w/ BP still.  ? ?Objective:   ?BP 138/89 (BP Location: Right Arm)   Pulse 83   Temp 98.3 ?F (36.8 ?C) (Oral)   Resp 18   Ht 5\' 9"  (1.753 m)   Wt 84 kg   SpO2 93%   BMI 27.35 kg/m?  ? ?Intake/Output Summary (Last 24 hours) at 08/01/2021 1001 ?Last data filed at 08/01/2021 0400 ?Gross per 24 hour  ?Intake 1622.44 ml  ?Output 800 ml  ?Net 822.44 ml  ? ?Weight  change: 0.4 kg ? ?Physical Exam: ?GYK:ZLDJT in bed, NAD ?CVS:rrr ?Resp:cta bl ?TSV:XBLT, nt ?Ext:no edema ?Neuro: awake/alert, speech clear and coherent ? ?Imaging: ?US RENAL ? ?Result Date: 07/31/2021 ?CLINICAL DATA:  Acute kidney injury. EXAM: RENAL / URINARY TRACT ULTRASOUND COMPLETE COMPARISON:  Abdominal ultrasound 12/24/2019; CTA chest abdomen and pelvis 07/27/2021 FINDINGS: Right Kidney: Renal measurements: 11.4 x 7.3 x 5.7 cm = volume: 146 mL. Mildly increased cortical echogenicity. Avascular cyst is seen within the lower pole of the right kidney measuring up to 5.1 cm similar to prior CT. Left Kidney: Renal measurements: 11.2 x 4.9 x 5.2 cm = volume: 149 mL. Mildly increased cortical echogenicity. Superior to the left kidney there is a hypoechoic round nodule measuring up to 10 mm, corresponding to the benign splenule seen on prior CT in between the spleen in the upper pole of the left kidney. Bladder: Appears normal for degree of bladder distention. A left ureteral jet was seen but a right ureteral jet was not seen. Other: None. IMPRESSION:: IMPRESSION: 1. Avascular cyst within the lower pole of the right kidney measuring up to 5.1 cm. 2. Mildly increased cortical echogenicity bilaterally, as can be seen with medical renal disease. 3. No hydronephrosis within either kidney. Electronically Signed   By: Yvonne Kendall M.D.   On: 07/31/2021 16:06   ? ?Labs: ?BMET ?Recent Labs  ?Lab 07/27/21 ?1032 07/27/21 ?1055 07/28/21 ?0336 07/29/21 ?0615 07/30/21 ?0230 07/31/21 ?0138 08/01/21 ?0426  ?NA 137 139 135 136 140 139 137  ?K 4.1 4.1 4.2 4.7 4.4 4.4 3.9  ?CL 101 102  103 105 106 108 104  ?CO2 24  --  24 22 23 22 23   ?GLUCOSE 97 94 100* 132* 125* 109* 86  ?BUN 21 23 22  32* 32* 42* 49*  ?CREATININE 1.27* 1.20 1.55* 1.95* 2.18* 2.65* 2.61*  ?CALCIUM 9.4  --  8.8* 8.8* 9.0 8.9 8.5*  ? ?CBC ?Recent Labs  ?Lab 07/27/21 ?1032 07/27/21 ?1055 07/29/21 ?0615 07/30/21 ?0230 07/31/21 ?0138 08/01/21 ?0426  ?WBC 5.4   < > 9.2  11.7* 9.8 7.0  ?NEUTROABS 3.3  --   --   --   --   --   ?HGB 13.6   < > 12.8* 12.3* 11.5* 11.2*  ?HCT 41.3   < > 38.2* 36.1* 34.9* 34.2*  ?MCV 89.8   < > 88.8 86.6 87.0 87.9  ?PLT 115*   < > 102* 120* 142* PLATELET CLUMPS NOTED ON SMEAR, UNABLE TO ESTIMATE  ? < > = values in this interval not displayed.  ? ? ?Medications:   ? ? carvedilol  6.25 mg Oral BID WC  ? Chlorhexidine Gluconate Cloth  6 each Topical Daily  ? folic acid  1 mg Oral Daily  ? multivitamin with minerals  1 tablet Oral Daily  ? pantoprazole  40 mg Oral Daily  ? thiamine  100 mg Oral Daily  ? Or  ? thiamine  100 mg Intravenous Daily  ? ? ? ? ?Gean Quint, MD ?[facility] ?08/01/2021, 10:01 AM  ? ?

## 2021-08-01 NOTE — Telephone Encounter (Signed)
TOC NEW HFU APPT ? ?Name: Edward Hopkins, Edward Hopkins MRN: [id number]  ?Date: 08/09/2021 Status: Sch  ?Time: 10:15 AM Length: 60  ?Visit Type: OPEN ESTABLISHED [726] Copay: $0.00  ?Provider: Jose Persia, MD    ? ?

## 2021-08-01 NOTE — Anesthesia Procedure Notes (Signed)
Arterial Line Insertion ?Start/End3/20/2023 12:00 PM, 08/01/2021 12:10 PM ?Performed by: Myrtie Soman, MD, Holtzman, Ariel Melanee Left, CRNA, CRNA ? Preanesthetic checklist: patient identified, IV checked, site marked, risks and benefits discussed, surgical consent, monitors and equipment checked, pre-op evaluation, timeout performed and anesthesia consent ?Emergency situation ?Right, radial was placed ?Catheter size: 20 G ?Hand hygiene performed  and maximum sterile barriers used  ?Allen's test indicative of satisfactory collateral circulation ?Procedure performed using ultrasound guided technique. ?Ultrasound Notes:anatomy identified, needle tip was noted to be adjacent to the nerve/plexus identified and no ultrasound evidence of intravascular and/or intraneural injection ?Following insertion, dressing applied and Biopatch. ?Post procedure assessment: normal ? ?Patient tolerated the procedure well with no immediate complications. ? ? ?

## 2021-08-01 NOTE — Anesthesia Preprocedure Evaluation (Signed)
Anesthesia Evaluation  ?Patient identified by MRN, date of birth, ID band ?Patient awake ? ? ? ?Reviewed: ?Allergy & Precautions, NPO status , Patient's Chart, lab work & pertinent test results ? ?Airway ?Mallampati: II ? ?TM Distance: >3 FB ?Neck ROM: Full ? ? ? Dental ?no notable dental hx. ? ?  ?Pulmonary ?neg pulmonary ROS, Current Smoker,  ?  ?Pulmonary exam normal ?breath sounds clear to auscultation ? ? ? ? ? ? Cardiovascular ?hypertension,  ?Rhythm:Regular Rate:Normal ?+ Systolic murmurs ?PSA including cocaine use/alcohol use, aortic dissection s/p thoracic aortic graft repair 2016, recent cocaine use who presented with CP and back pain, found to have hypertensive emergency with recurrent acute aortic type B dissection. EDP discussed case with CVTS who recommended medical management. ?Left ventricular ejection fraction, by estimation, is 60 to 65%. The  ?left ventricle has normal function. The left ventricle has no regional  ?wall motion abnormalities. There is moderate asymmetric left ventricular  ?hypertrophy. Left ventricular  ?diastolic parameters were normal.  ??2. Right ventricular systolic function is normal. The right ventricular  ?size is normal.  ??3. The mitral valve is normal in structure. Trivial mitral valve  ?regurgitation. No evidence of mitral stenosis.  ??4. The aortic valve is tricuspid. Aortic valve regurgitation is trivial.  ?No aortic stenosis is present.  ? ?  ?Neuro/Psych ?negative neurological ROS ? negative psych ROS  ? GI/Hepatic ?negative GI ROS, (+)  ?  ? substance abuse ? cocaine use,   ?Endo/Other  ?negative endocrine ROS ? Renal/GU ?Renal Insufficiency and ARFRenal disease  ?negative genitourinary ?  ?Musculoskeletal ?negative musculoskeletal ROS ?(+)  ? Abdominal ?  ?Peds ?negative pediatric ROS ?(+)  Hematology ? ?(+) Blood dyscrasia, anemia ,   ?Anesthesia Other Findings ? ? Reproductive/Obstetrics ?negative OB ROS ? ?   ? ? ? ? ? ? ? ? ? ? ? ? ? ?  ?  ? ? ? ? ? ? ? ? ?Anesthesia Physical ?Anesthesia Plan ? ?ASA: 5 and emergent ? ?Anesthesia Plan: General  ? ?Post-op Pain Management: Minimal or no pain anticipated  ? ?Induction: Intravenous ? ?PONV Risk Score and Plan: 1 and Ondansetron and Treatment may vary due to age or medical condition ? ?Airway Management Planned: Oral ETT ? ?Additional Equipment: Arterial line ? ?Intra-op Plan:  ? ?Post-operative Plan: Possible Post-op intubation/ventilation ? ?Informed Consent: I have reviewed the patients History and Physical, chart, labs and discussed the procedure including the risks, benefits and alternatives for the proposed anesthesia with the patient or authorized representative who has indicated his/her understanding and acceptance.  ? ? ? ?Dental advisory given ? ?Plan Discussed with: CRNA and Surgeon ? ?Anesthesia Plan Comments:   ? ? ? ? ? ? ?Anesthesia Quick Evaluation ? ?

## 2021-08-01 NOTE — Anesthesia Procedure Notes (Signed)
Central Venous Catheter Insertion ?Performed by: Myrtie Soman, MD, anesthesiologist ?Start/End3/20/2023 12:25 PM, 08/01/2021 12:45 PM ?Patient location: Pre-op. ?Preanesthetic checklist: patient identified, IV checked, site marked, risks and benefits discussed, surgical consent, monitors and equipment checked, pre-op evaluation, timeout performed and anesthesia consent ?Position: Trendelenburg ?Lidocaine 1% used for infiltration and patient sedated ?Hand hygiene performed  and maximum sterile barriers used  ?Catheter size: 8 Fr ?Total catheter length 16. ?Double lumen ?Procedure performed using ultrasound guided technique. ?Ultrasound Notes:anatomy identified, needle tip was noted to be adjacent to the nerve/plexus identified, no ultrasound evidence of intravascular and/or intraneural injection and image(s) printed for medical record ?Attempts: 1 ?Following insertion, line sutured, dressing applied and Biopatch. ?Post procedure assessment: blood return through all ports, free fluid flow and no air ? ?Patient tolerated the procedure well with no immediate complications. ? ? ? ?

## 2021-08-01 NOTE — Op Note (Addendum)
Date: August 01, 2021 ? 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 ?Postoperative diagnosis: Same ? ?Procedure: ?1.  Ultrasound-guided access right common femoral artery ?2.  Intravascular ultrasound (IVUS) of right iliac, infrarenal abdominal aorta, descending thoracic aorta, aortic arch ?3.  Arch aortogram with abdominal aortogram including catheter selection of aorta ?4.  Repair of descending thoracic dissection with rupture with stent graft without coverage of the left subclavian artery (34 mm x 34 mm x 20 cm Gore graft, 34 mm x 34 mm x 20 cm Gore graft, and 34 mm x 34 mm x 10 cm Gore graft) ?5.  Stent graft of infrarenal abdominal aorta x2 (23 mm x 4.5 cm Gore cuff and 26 mm x 4.5 cm Gore cuff) ? ?Surgeon: Dr. Marty Heck, MD ? ?Assistant: Roxy Horseman, PA ? ?Indications: Patient is a 71 year old male who was admitted last week with a type B descending thoracic dissection in the setting of known ascending repair by CT surgery years ago.  I was consulted today after he developed acute hypotension with worsening chest pain.  I recommended an emergent CTA that showed extension of the dissection into the visceral segment with rupture in the chest.  He presents emergently to the operating room after risk benefits discussed.  An assistant was needed for wire exchange and to assist with device deployment. ? ?Findings: Ultrasound-guided access right common femoral artery with Perclose closure x2.  Initially tried to cross the dissection retrograde using a pigtail catheter into the aortic arch, but I got into the false lumen given a fenestration.  I used IVUS to find the fenestration and then I was able use a guide cath and a wire to get in the true lumen and get my catheter into the ascending aorta where the repair had been performed in the past.  We then stented the descending thoracic aorta including partial coverage of the left  subclavian artery with a 34 mm x 34 mm x 20 cm and then a 34 mm x 34 mm x 20 cm extending just above the celiac artery proximally.  We had to put a bridge piece between these two pieces with a 34 mm x 34 mm x 10 cm.  There was good seal proximally, but there was evidence of a type Ib endoleak coming from the abdominal aorta retrograde from a fenestration distal to repair.  This was treated with two abdominal aortic cuffs using a 23 mm x 4.5 cm and 26 mm x 4.5 cm cuff.  This type Ib leak was much improved but there was an additional Ib leak coming from the visceral segment between the SMA and the renals that we did not feel we could further stent likely due to a fenetration.  On final imaging the endoleak did not reach the ruptured segment.  We did not elect for further intervention at this time given limited options. ? ?Final imaging showed filling of the left renal but no filling of the right renal.  SMA and celiac both filled. ? ?Anesthesia: General ? ?Details: Patient was taken emergently to OR 16 after risks benefits discussed.  Placed on the operative table in the supine position.  Bilateral groins and the abdominal wall were prepped and draped in standard sterile fashion.  Antibiotics were given.  Timeout was performed.  Initially evaluated the right common femoral artery with ultrasound, it was patent, and image was saved.  I accessed this with micro access needle, placed  a microwire, and then made a stab incision and dissected down over my wire and then placed a microsheath and a Bentson wire.  Dilated with an 8 French sheath over the Bentson wire and then placed Perclose devices at 10:00 and 2:00.  We then placed a short 8 French sheath in the right common femoral artery.  I then used a pigtail catheter and tried to spin this through the abdominal aorta and thoracic aorta to get through the true lumen of the dissection retrograde.  Ultimately I got through a fenestration was in the false lumen near the left  subclavian artery.  We then performed IVUS of the abdominal aorta as well as the descending thoracic aorta to visualize the fenestration and then we were able to use a guide catheter over the wire to get into the true lumen and advanced our wire and catheter all the way across the arch into the ascending aorta.  We performed IVUS all the way up through the aortic arch into the ascending aorta to confirm we were in the true lumen.  I then placed a double curved Lundy wire and a 22 French Gore dry seal sheath in the right groin.  Patient was given 100 units/kg IV heparin.  We checked ACT's to maintain greater than 250.  Additional heparin was given throughout the case.  I then advanced our first piece which was a 34 mm x 34 mm x 20 cm Gore endograft with partial coverage of the left subclavian artery and this was deployed after we identified the takeoff of the left subclavian artery.  We then pulled our catheter down and got imaging of the celiac and SMA and then deployed a second piece from just proximal to the celiac artery more proximally using a 34 mm x 34 mm x 20 cm.  We then required a bridge piece which was a 34 mm x 34 mm x 10 cm.  Once I was happy with the results we shot a final arch aortogram and had good seal proximally but there was a type Ib endoleak distally and this looked to come from fenestration distally.  Upon further inspection with aortogram including IVUS there appeared to be a fenestration in the abdominal aortic segment.  This was then stented in the infrarenal abdominal aorta using a 23 mm x 4.5 cm cuff and then a 26 mm x 4.5 cm Gore cuff.  Another image showed a much slower type Ib retrograde leak with no filling in the ruptured segment.  We felt there were limited options moving forward given the only segment we had not stented was the visceral segment including the celiac SMA and renals and we did not feel we could treat this with covered stenting.  Patient become more stable at this time.   We elected to reverse his heparin after the sheath was removed and we tied down Perclose devices at 10:00 and 2:00.  I did place a third Perclose device in the right common femoral artery.  We had good signals in the foot.  50 mg protamine was given.  To be taken to the ICU intubated and critically ill. ? ?Condition: Critical ? ?Disposition: ICU ? ?Marty Heck, MD ?[facility] ?Office: [phone] ? ? ?Marty Heck ? ? ? ?

## 2021-08-01 NOTE — Progress Notes (Signed)
[facility] [place] ? ?Subjective  - Intubated ? ? ?Objective ?(!) 132/92 ?73 ?(!) 97.4 ?F (36.3 ?C) (Oral) ?18 ?98% ? ?Intake/Output Summary (Last 24 hours) at 08/01/2021 1616 ?Last data filed at 08/01/2021 1455 ?Gross per 24 hour  ?Intake 6052.44 ml  ?Output 920 ml  ?Net 5132.44 ml  ? ? ?Intubated/sedated ?Palpable DP pulses B ?Right groin soft without hematoma. ?Abdomin distended, compressible  ?BP stable 132/92  ? 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 ?Palpable DP pulses B, right groin soft ?HGB stable post op 9.0, EBL 100 cc.  Abdomin distended, but soft.  Received 2 units PRBC. ? ? ? ?Roxy Horseman ?08/01/2021 ?4:16 PM ?-- ? ?Laboratory ?Lab Results: ?Recent Labs  ?  08/01/21 ?0426 08/01/21 ?1149  ?WBC 7.0 8.9  ?HGB 11.2* 9.0*  ?HCT 34.2* 28.1*  ?PLT PLATELET CLUMPS NOTED ON SMEAR, UNABLE TO ESTIMATE 103*  ? ?BMET ?Recent Labs  ?  08/01/21 ?0426 08/01/21 ?1532  ?NA 137 134*  ?K 3.9 4.8  ?CL 104 106  ?CO2 23 19*  ?GLUCOSE 86 191*  ?BUN 49* 49*  ?CREATININE 2.61* 2.62*  ?CALCIUM 8.5* 8.6*  ? ? ?COAG ?Lab Results  ?Component Value Date  ? INR 1.7 (H) 08/01/2021  ? INR 1.6 (H) 08/01/2021  ? INR 1.1 07/27/2021  ? ?No results found for: PTT ? ? ? ?

## 2021-08-01 NOTE — Care Management Important Message (Deleted)
Important Message ? ?Patient Details  ?Name: Edward Hopkins ?MRN: [id number] ?Date of Birth: 06-08-1950 ? ? ?Medicare Important Message Given:  Yes ? ? ? ? ?[name] ?08/01/2021, 10:43 AM ?

## 2021-08-02 ENCOUNTER — Inpatient Hospital Stay (HOSPITAL_COMMUNITY): Payer: Medicare Other

## 2021-08-02 ENCOUNTER — Encounter (HOSPITAL_COMMUNITY): Payer: Self-pay | Admitting: Vascular Surgery

## 2021-08-02 DIAGNOSIS — I7100 Dissection of unspecified site of aorta: Secondary | ICD-10-CM | POA: Diagnosis not present

## 2021-08-02 DIAGNOSIS — N179 Acute kidney failure, unspecified: Secondary | ICD-10-CM | POA: Diagnosis not present

## 2021-08-02 DIAGNOSIS — J9601 Acute respiratory failure with hypoxia: Secondary | ICD-10-CM

## 2021-08-02 DIAGNOSIS — R579 Shock, unspecified: Secondary | ICD-10-CM | POA: Diagnosis not present

## 2021-08-02 DIAGNOSIS — I719 Aortic aneurysm of unspecified site, without rupture: Secondary | ICD-10-CM | POA: Diagnosis not present

## 2021-08-02 DIAGNOSIS — I7101 Dissection of ascending aorta: Secondary | ICD-10-CM

## 2021-08-02 DIAGNOSIS — F1410 Cocaine abuse, uncomplicated: Secondary | ICD-10-CM

## 2021-08-02 DIAGNOSIS — J9602 Acute respiratory failure with hypercapnia: Secondary | ICD-10-CM

## 2021-08-02 LAB — BASIC METABOLIC PANEL
Anion gap: 12 (ref 5–15)
Anion gap: 11 (ref 5–15)
BUN: 53 mg/dL — ABNORMAL HIGH (ref 8–23)
BUN: 55 mg/dL — ABNORMAL HIGH (ref 8–23)
CO2: 18 mmol/L — ABNORMAL LOW (ref 22–32)
CO2: 17 mmol/L — ABNORMAL LOW (ref 22–32)
Calcium: 7.9 mg/dL — ABNORMAL LOW (ref 8.9–10.3)
Calcium: 7.7 mg/dL — ABNORMAL LOW (ref 8.9–10.3)
Chloride: 108 mmol/L (ref 98–111)
Chloride: 109 mmol/L (ref 98–111)
Creatinine, Ser: 3.02 mg/dL — ABNORMAL HIGH (ref 0.61–1.24)
Creatinine, Ser: 3.25 mg/dL — ABNORMAL HIGH (ref 0.61–1.24)
GFR, Estimated: 21 mL/min — ABNORMAL LOW (ref >60)
GFR, Estimated: 19 mL/min — ABNORMAL LOW (ref >60)
Glucose, Bld: 122 mg/dL — ABNORMAL HIGH (ref 70–99)
Glucose, Bld: 73 mg/dL (ref 70–99)
Potassium: 4.5 mmol/L (ref 3.5–5.1)
Potassium: 4.2 mmol/L (ref 3.5–5.1)
Sodium: 138 mmol/L (ref 135–145)
Sodium: 137 mmol/L (ref 135–145)

## 2021-08-02 LAB — CBC WITH DIFFERENTIAL/PLATELET
Abs Immature Granulocytes: 0.4 10*3/uL — ABNORMAL HIGH (ref 0.00–0.07)
Band Neutrophils: 9 %
Basophils Absolute: 0.1 10*3/uL (ref 0.0–0.1)
Basophils Relative: 1 %
Eosinophils Absolute: 0.2 10*3/uL (ref 0.0–0.5)
Eosinophils Relative: 2 %
HCT: 32.2 % — ABNORMAL LOW (ref 39.0–52.0)
Hemoglobin: 11 g/dL — ABNORMAL LOW (ref 13.0–17.0)
Lymphocytes Relative: 19 %
Lymphs Abs: 1.7 10*3/uL (ref 0.7–4.0)
MCH: 29.2 pg (ref 26.0–34.0)
MCHC: 34.2 g/dL (ref 30.0–36.0)
MCV: 85.4 fL (ref 80.0–100.0)
Metamyelocytes Relative: 1 %
Monocytes Absolute: 0.5 10*3/uL (ref 0.1–1.0)
Monocytes Relative: 6 %
Myelocytes: 4 %
Neutro Abs: 5.8 10*3/uL (ref 1.7–7.7)
Neutrophils Relative %: 58 %
Platelets: 82 10*3/uL — ABNORMAL LOW (ref 150–400)
RBC: 3.77 MIL/uL — ABNORMAL LOW (ref 4.22–5.81)
RDW: 15.5 % (ref 11.5–15.5)
Smear Review: DECREASED
WBC: 8.7 10*3/uL (ref 4.0–10.5)
nRBC: 0.7 % — ABNORMAL HIGH (ref 0.0–0.2)

## 2021-08-02 LAB — CBC
HCT: 36.7 % — ABNORMAL LOW (ref 39.0–52.0)
Hemoglobin: 12.6 g/dL — ABNORMAL LOW (ref 13.0–17.0)
MCH: 29.2 pg (ref 26.0–34.0)
MCHC: 34.3 g/dL (ref 30.0–36.0)
MCV: 85 fL (ref 80.0–100.0)
Platelets: 81 10*3/uL — ABNORMAL LOW (ref 150–400)
RBC: 4.32 MIL/uL (ref 4.22–5.81)
RDW: 15.1 % (ref 11.5–15.5)
WBC: 10.1 10*3/uL (ref 4.0–10.5)
nRBC: 2.4 % — ABNORMAL HIGH (ref 0.0–0.2)

## 2021-08-02 LAB — POCT I-STAT 7, (LYTES, BLD GAS, ICA,H+H)
Acid-base deficit: 4 mmol/L — ABNORMAL HIGH (ref 0.0–2.0)
Bicarbonate: 20.1 mmol/L (ref 20.0–28.0)
Calcium, Ion: 1.15 mmol/L (ref 1.15–1.40)
HCT: 32 % — ABNORMAL LOW (ref 39.0–52.0)
Hemoglobin: 10.9 g/dL — ABNORMAL LOW (ref 13.0–17.0)
O2 Saturation: 95 %
Patient temperature: 98.6
Potassium: 4.5 mmol/L (ref 3.5–5.1)
Sodium: 138 mmol/L (ref 135–145)
TCO2: 21 mmol/L — ABNORMAL LOW (ref 22–32)
pCO2 arterial: 31.8 mmHg — ABNORMAL LOW (ref 32–48)
pH, Arterial: 7.409 (ref 7.35–7.45)
pO2, Arterial: 76 mmHg — ABNORMAL LOW (ref 83–108)

## 2021-08-02 LAB — ECHOCARDIOGRAM LIMITED
Height: 69 in
S' Lateral: 3.5 cm
Weight: 3252.23 oz

## 2021-08-02 LAB — HEPATIC FUNCTION PANEL
ALT: 49 U/L — ABNORMAL HIGH (ref 0–44)
AST: 35 U/L (ref 15–41)
Albumin: 2.1 g/dL — ABNORMAL LOW (ref 3.5–5.0)
Alkaline Phosphatase: 42 U/L (ref 38–126)
Bilirubin, Direct: 1.8 mg/dL — ABNORMAL HIGH (ref 0.0–0.2)
Indirect Bilirubin: 1 mg/dL — ABNORMAL HIGH (ref 0.3–0.9)
Total Bilirubin: 2.8 mg/dL — ABNORMAL HIGH (ref 0.3–1.2)
Total Protein: 5.3 g/dL — ABNORMAL LOW (ref 6.5–8.1)

## 2021-08-02 LAB — LACTIC ACID, PLASMA
Lactic Acid, Venous: 1.5 mmol/L (ref 0.5–1.9)
Lactic Acid, Venous: 1.4 mmol/L (ref 0.5–1.9)

## 2021-08-02 LAB — GLUCOSE, CAPILLARY
Glucose-Capillary: 123 mg/dL — ABNORMAL HIGH (ref 70–99)
Glucose-Capillary: 77 mg/dL (ref 70–99)
Glucose-Capillary: 78 mg/dL (ref 70–99)
Glucose-Capillary: 79 mg/dL (ref 70–99)
Glucose-Capillary: 82 mg/dL (ref 70–99)
Glucose-Capillary: 83 mg/dL (ref 70–99)
Glucose-Capillary: 95 mg/dL (ref 70–99)

## 2021-08-02 LAB — MAGNESIUM
Magnesium: 2.1 mg/dL (ref 1.7–2.4)
Magnesium: 2.1 mg/dL (ref 1.7–2.4)

## 2021-08-02 LAB — PHOSPHORUS: Phosphorus: 4.2 mg/dL (ref 2.5–4.6)

## 2021-08-02 LAB — TRIGLYCERIDES: Triglycerides: 111 mg/dL (ref <150)

## 2021-08-02 LAB — PREPARE RBC (CROSSMATCH)

## 2021-08-02 MED ORDER — SODIUM CHLORIDE 0.9 % IV SOLN
2.0000 g | INTRAVENOUS | Status: DC
  Administered 2021-08-02 – 2021-08-04 (×3): 2 g via INTRAVENOUS
  Filled 2021-08-02 (×3): qty 2

## 2021-08-02 MED ORDER — LACTATED RINGERS IV BOLUS
1000.0000 mL | Freq: Once | INTRAVENOUS | Status: AC
  Administered 2021-08-02: 1000 mL via INTRAVENOUS

## 2021-08-02 MED ORDER — SODIUM CHLORIDE 0.9 % IV SOLN
0.0000 ug/min | INTRAVENOUS | Status: DC
  Administered 2021-08-02: 180 ug/min via INTRAVENOUS
  Administered 2021-08-02: 250 ug/min via INTRAVENOUS
  Administered 2021-08-02: 50 ug/min via INTRAVENOUS
  Filled 2021-08-02 (×5): qty 10

## 2021-08-02 MED ORDER — BISACODYL 10 MG RE SUPP: 10.0000 mg | Freq: Every day | RECTAL | Status: DC | PRN

## 2021-08-02 MED ORDER — BISACODYL 10 MG RE SUPP
10.0000 mg | Freq: Every day | RECTAL | Status: DC
  Administered 2021-08-02 – 2021-08-10 (×6): 10 mg via RECTAL
  Filled 2021-08-02 (×7): qty 1

## 2021-08-02 MED ORDER — PANTOPRAZOLE SODIUM 40 MG IV SOLR
40.0000 mg | Freq: Every day | INTRAVENOUS | Status: DC
  Administered 2021-08-02 – 2021-08-09 (×8): 40 mg via INTRAVENOUS
  Filled 2021-08-02 (×8): qty 10

## 2021-08-02 MED ORDER — ARTIFICIAL TEARS OPHTHALMIC OINT
TOPICAL_OINTMENT | Freq: Every evening | OPHTHALMIC | Status: DC | PRN
  Filled 2021-08-02: qty 3.5

## 2021-08-02 NOTE — Progress Notes (Signed)
Pharmacy Antibiotic Note ? ?AZAVION BOUILLON is a 71 y.o. male  s/p OR 3/20 for emergent repair of an aortic dissection on pressors. Pharmacy consulted to dose cefepime for empiric coverage ? ?Plan: ?-Cefepime 2gm IV q24h ?-Will follow renal function, cultures and clinical progress ? ? ?Height: 5\' 9"  (175.3 cm) ?Weight: 92.2 kg (203 lb 4.2 oz) ?IBW/kg (Calculated) : 70.7 ? ?Temp (24hrs), Avg:98.2 ?F (36.8 ?C), Min:97.4 ?F (36.3 ?C), Max:98.9 ?F (37.2 ?C) ? ?Recent Labs  ?Lab 07/31/21 ?0138 08/01/21 ?0426 08/01/21 ?1149 08/01/21 ?1532 08/01/21 ?1742 08/01/21 ?1949 08/02/21 ?4730 08/02/21 ?8569  ?WBC 9.8 7.0 8.9 12.7*  --  11.0* 10.1  --   ?CREATININE 2.65* 2.61*  --  2.62*  --  3.05* 3.02*  --   ?LATICACIDVEN  --   --   --   --  1.8  --  1.5 1.4  ?  ?Estimated Creatinine Clearance: 24.4 mL/min (A) (by C-G formula based on SCr of 3.02 mg/dL (H)).   ? ?No Known Allergies ? ?Antimicrobials this admission: ?3/21 cefepime>> ? ?Dose adjustments this admission: ? ? ?Microbiology results: ? ? ?Thank you for allowing pharmacy to be a part of this patient?s care. ? ?Hildred Laser, PharmD ?Clinical Pharmacist ?**Pharmacist phone directory can now be found on [email or web address] ([email or web address]).  Listed under [place]. ? ? ?

## 2021-08-02 NOTE — Progress Notes (Signed)
[facility] [place] ? ?Subjective  -remains intubated.  Bedside nursing reports he was moving his lower extremities when sedation weaned off.  Volatile blood pressures overnight and pressors adjusted.  Did get 2 L crystalloid bolus. ? ?Objective ?108/81 ?71 ?98.8 ?F (37.1 ?C) (Oral) ?20 ?94% ? ?Intake/Output Summary (Last 24 hours) at 08/02/2021 0641 ?Last data filed at 08/02/2021 0600 ?Gross per 24 hour  ?Intake 8825.38 ml  ?Output 483 ml  ?Net 8342.38 ml  ? ?ET tube in place and intubated ?Right groin incision clean dry and intact with no hematoma ?Bilateral DP pulses palpable ?Abdomen distended but soft as noted preop yesterday ? ?Laboratory ?Lab Results: ?Recent Labs  ?  08/01/21 ?1949 08/01/21 ?2001 08/01/21 ?2334 08/02/21 ?0315  ?WBC 11.0*  --   --  10.1  ?HGB 13.3   < > 12.5* 12.6*  ?HCT 38.8*   < > 37.6* 36.7*  ?PLT 92*  --   --  81*  ? < > = values in this interval not displayed.  ? ?BMET ?Recent Labs  ?  08/01/21 ?1949 08/01/21 ?2001 08/02/21 ?0315  ?NA 133* 136 138  ?K 4.3 4.4 4.5  ?CL 102  --  108  ?CO2 19*  --  18*  ?GLUCOSE 223*  --  122*  ?BUN 54*  --  53*  ?CREATININE 3.05*  --  3.02*  ?CALCIUM 8.4*  --  7.9*  ? ? ?COAG ?Lab Results  ?Component Value Date  ? INR 1.7 (H) 08/01/2021  ? INR 1.6 (H) 08/01/2021  ? INR 1.1 07/27/2021  ? ?No results found for: PTT ? ?Assessment/Planning: ? ?Postop day 1 status post emergent repair of descending type b thoracic dissection with aortic rupture.  Remains intubated and critically ill in the ICU.  This morning pressors changed to 20 of epi, 280 neo, 0.03 vasopressin.  I do think he needs a transthoracic echocardiogram today.  Right groin incision is clean with no hematoma.  He has palpable DP pulses bilaterally.  Hemoglobin stable overnight 13.3 --> 12.5 --> 12.6.  White count normalized now 10.1.  Lactic acid normal.  Labs are all reassuring.  Obviously this is a big insult and will need continued supportive care and appreciate critical  care services.  Bedside nursing does report he moves his lower extremities when sedation weaned.  That is encouraging given high risk for paralysis given the extent of thoracic and abdominal coverage with endograft.  Minimal urine output overnight of 213 mL and nephrology following.  We did not see any filling of the right renal and only filling of the left renal artery at completion with dissection extending into visceral segment.  Celiac and SMA both filled without flow limiting stenosis.  Vascular will follow. ? ?Marty Heck ?08/02/2021 ?6:41 AM ?-- ? ? ?

## 2021-08-02 NOTE — Procedures (Signed)
Arterial Catheter Insertion Procedure Note ? ?CHRISTION LEONHARD  ?[id number]  ?12-08-1950 ? ?Date:08/02/21  ?Time:10:19 AM  ? ? ?Provider Performing: Otilio Carpen Wisam Siefring  ? ? ?Procedure: Insertion of Arterial Line [id number]) with US guidance (67893)  ? ?Indication(s) ?Blood pressure monitoring and/or need for frequent ABGs ? ?Consent ?Unable to obtain consent due to emergent nature of procedure. ? ?Anesthesia ?None ? ? ?Time Out ?Verified patient identification, verified procedure, site/side was marked, verified correct patient position, special equipment/implants available, medications/allergies/relevant history reviewed, required imaging and test results available. ? ? ?Sterile Technique ?Maximal sterile technique including full sterile barrier drape, hand hygiene, sterile gown, sterile gloves, mask, hair covering, sterile ultrasound probe cover (if used). ? ? ?Procedure Description ?Area of catheter insertion was cleaned with chlorhexidine and draped in sterile fashion. With real-time ultrasound guidance an arterial catheter was placed into the left radial artery.  Appropriate arterial tracings confirmed on monitor.   ? ? ?Complications/Tolerance ?None; patient tolerated the procedure well. ? ? ?EBL ?Minimal ? ? ?Specimen(s) ?None ? ?Otilio Carpen Hadiya Spoerl, PA-C ? ? ?

## 2021-08-02 NOTE — Progress Notes (Signed)
Updated patient's significant other via phone ? ? ?Otilio Carpen Deyja Sochacki, PA-C ? ?

## 2021-08-02 NOTE — Progress Notes (Signed)
? ?NAME:  Edward Hopkins, MRN:  [id number], DOB:  November 09, 1950, LOS: 6 ?ADMISSION DATE:  07/27/2021, CONSULTATION DATE:  07/27/21 ?REFERRING MD:  Francia Greaves - EM, CHIEF COMPLAINT:  Aortic Dissection   ? ?History of Present Illness:  ?71 yo M PMH HTN, Hep C, polysubstance abuse including ongoing cocaine use, Aortic dissection s/p thoracic aortic graft repair, thoracic aortic aneurysm presented to [facility] 3/15 with CC chest pain and CTA revealed recurrent type B dissection with flap beginning distal to takeoff of L subclavian artery and extending to takeoff of SMA.  ? ?Case was discussed with CVTS who recommended medical management so he was admitted to ICU for aggressive blood pressure management. He was transferred to the floor 3/17.  ? ?PCCM was called back on the morning of 3/20 due to acute onset hypotension and associated chest pain, after emergent CTA he was found to have extension of the dissection into the visceral segment with rupture in the chest.  He was taken emergently to the OR by vascular surgery ? ?Pertinent  Medical History  ?HTN ?Hep C  ?Aortic dissection  ?Aortic aneurysm  ?DVT ? ?Significant Hospital Events: ?Including procedures, antibiotic start and stop dates in addition to other pertinent events   ?3/15 Ed for chest and back pain. Found to have recurrent type B dissection. Admit to PCCM  ?3/17 transfer out of ICU ?3/20 aneurysm ruptured; taken emergently for surgery; transferred back to ICU ?3/21 On three pressors, following commands ? ?Interim History / Subjective:  ?Received 2 units PBRC's and 2L IVF post-op, this morning is on Epi, Neo and Vaso, is following commands ?Making small amount of urine  ? ?Objective   ?Blood pressure 107/85, pulse 68, temperature 98.8 ?F (37.1 ?C), temperature source Oral, resp. rate 20, height 5\' 9"  (1.753 m), weight 92.2 kg, SpO2 90 %. ?CVP:  [7 mmHg-22 mmHg] 11 mmHg  ?Vent Mode: PRVC ?FiO2 (%):  [40 %-60 %] 50 % ?Set Rate:  [18 bmp-20 bmp] 20 bmp ?Vt Set:  [560 mL] 560  mL ?PEEP:  [0 cmH20-5 cmH20] 0 cmH20 ?Plateau Pressure:  [21 cmH20-29 cmH20] 21 cmH20  ? ?Intake/Output Summary (Last 24 hours) at 08/02/2021 0756 ?Last data filed at 08/02/2021 0700 ?Gross per 24 hour  ?Intake 9280.46 ml  ?Output 513 ml  ?Net 8767.46 ml  ? ? ?Filed Weights  ? 07/31/21 0416 08/01/21 0451 08/02/21 0500  ?Weight: 83.6 kg 84 kg 92.2 kg  ? ?General:  critically ill-appearing M, intubated and sedated  ?HEENT: MM pink/moist, pupils equal, sclera anicteric ?Neuro: examined on propofol and fentanyl, opening eyes to voice and slightly moving extremities to command ?CV: s1s2 rrr, no m/r/g ?PULM:  mechanical breath sounds bilaterally with decreased air entry in the bilateral bases ?GI: firm, distended, no active BS auscultated ?Extremities: warm/dry, no edema, no skin mottling, equal temperature and palpaple distal pulses ?Skin: no rashes or lesions ? ?Resolved Hospital Problem list   ? ? ?Assessment & Plan:  ? ?Ruptured aortic aneurysm ?3/15 CTA: L subclavian artery to level of SMA takeoff. Fenestration of dissection flap mid descending thoracic aorta ?3/20 repeat CTA with aortic rupture with mediastinal and retroperitoneal hematoma. Extension of dissection flap to IMA and right renal artery ?Hx of aortic dissection status post ascending aortic replacement including aorto innominate and aorto left carotid bypass by Dr. Cameron Ali)  ?3/20 Taken to OR for emergent repair ?-increasing pressor requirement overnight, has received 2 units PRBC's and 2L IVF since OR ?-currently on Epi 48mcg, 0.03 Vaso and  Neo 270mcg ?-management per vascular surgery, continue supportive care ?-plan for TTE  ?-continue close monitoring  ? ? ? ?Acute Hypoxic Respiratory Failure ?FiO2 increased to 100% this AM, able to titrate down to 70% ?CXR with bilateral pleural effusions ?-hold further IVF ?-worsening renal function with minimal UOP, may need dialysis ?--Maintain full vent support with SAT/SBT as tolerated ?-titrate Vent setting to  maintain SpO2 greater than or equal to 90%. ?-HOB elevated 30 degrees. ?-Plateau pressures less than 30 cm H20.  ?-Follow chest x-ray, ABG prn.   ?-Bronchial hygiene and RT/bronchodilator protocol. ? ? ? ? ? ?Acute Kidney Injury ?Creatinine 3.05 today, stable from yesterday with approx 200cc UOP ?K WNL ?-appreciate nephrology recommendations, likely heading towards CRRT, but no emergent indication right now ?-continue to monitor renal indices and electrolytes , follow UOP and avoid nephrotoxins as able ? ? ? ? ?Ileus  ?RN concerned that Abdomen more distended this AM ?-KUB with diffuse SB dilation likel y2/2 ileus or obstruction ?-NG to suction, hold starting TF ?-checking repeat lactic  ? ? ? ?Cocaine use disorder ?Bilateral inguinal hernias (L>R) ?-OP follow up  ? ? ?Best Practice (right click and "Reselect all SmartList Selections" daily)  ? ?Diet/type: NPO w/ oral meds ?DVT prophylaxis: SCD ?GI prophylaxis: PPI ?Lines: Arterial Line ?Foley:  Yes, and it is still needed ?Code Status:  full code ?Last date of multidisciplinary goals of care discussion [pending] ? ?Labs   ?CBC: ?Recent Labs  ?Lab 07/27/21 ?1032 07/27/21 ?1055 08/01/21 ?0426 08/01/21 ?1149 08/01/21 ?1229 08/01/21 ?1532 08/01/21 ?1536 08/01/21 ?1949 08/01/21 ?2001 08/01/21 ?2334 08/02/21 ?3151 08/02/21 ?0701  ?WBC 5.4   < > 7.0 8.9  --  12.7*  --  11.0*  --   --  10.1  --   ?NEUTROABS 3.3  --   --   --   --   --   --  6.9  --   --   --   --   ?HGB 13.6   < > 11.2* 9.0*   < > 12.8*   < > 13.3 13.3 12.5* 12.6* 10.9*  ?HCT 41.3   < > 34.2* 28.1*   < > 37.7*   < > 38.8* 39.0 37.6* 36.7* 32.0*  ?MCV 89.8   < > 87.9 89.8  --  87.1  --  85.1  --   --  85.0  --   ?PLT 115*   < > PLATELET CLUMPS NOTED ON SMEAR, UNABLE TO ESTIMATE 103*  --  84*  --  92*  --   --  81*  --   ? < > = values in this interval not displayed.  ? ? ? ?Basic Metabolic Panel: ?Recent Labs  ?Lab 07/27/21 ?1032 07/27/21 ?1055 07/31/21 ?0138 08/01/21 ?0426 08/01/21 ?1229 08/01/21 ?1532  08/01/21 ?1536 08/01/21 ?1949 08/01/21 ?2001 08/02/21 ?7616 08/02/21 ?0701  ?NA 137   < > 139 137   < > 134* 135 133* 136 138 138  ?K 4.1   < > 4.4 3.9   < > 4.8 4.7 4.3 4.4 4.5 4.5  ?CL 101   < > 108 104  --  106  --  102  --  108  --   ?CO2 24   < > 22 23  --  19*  --  19*  --  18*  --   ?GLUCOSE 97   < > 109* 86  --  191*  --  223*  --  122*  --   ?  BUN 21   < > 42* 49*  --  49*  --  54*  --  53*  --   ?CREATININE 1.27*   < > 2.65* 2.61*  --  2.62*  --  3.05*  --  3.02*  --   ?CALCIUM 9.4   < > 8.9 8.5*  --  8.6*  --  8.4*  --  7.9*  --   ?MG 2.0  --   --   --   --  2.2  --  2.2  --   --   --   ? < > = values in this interval not displayed.  ? ? ?GFR: ?Estimated Creatinine Clearance: 24.4 mL/min (A) (by C-G formula based on SCr of 3.02 mg/dL (H)). ?Recent Labs  ?Lab 08/01/21 ?1149 08/01/21 ?1532 08/01/21 ?1742 08/01/21 ?1949 08/02/21 ?0315  ?WBC 8.9 12.7*  --  11.0* 10.1  ?LATICACIDVEN  --   --  1.8  --  1.5  ? ? ? ?Liver Function Tests: ?Recent Labs  ?Lab 07/29/21 ?0615 07/30/21 ?0230 07/31/21 ?6333 08/01/21 ?1949 08/02/21 ?0315  ?AST 66* 189* 106* 38 35  ?ALT 27 137* 138* 63* 49*  ?ALKPHOS 56 57 52 49 42  ?BILITOT 1.1 1.1 0.8 3.0* 2.8*  ?PROT 6.7 6.7 6.6 5.2* 5.3*  ?ALBUMIN 2.8* 2.6* 2.3* 1.9* 2.1*  ? ? ?Recent Labs  ?Lab 07/27/21 ?1032  ?LIPASE 40  ? ? ?No results for input(s): AMMONIA in the last 168 hours. ? ?ABG ?   ?Component Value Date/Time  ? PHART 7.409 08/02/2021 0701  ? PCO2ART 31.8 (L) 08/02/2021 0701  ? PO2ART 76 (L) 08/02/2021 0701  ? HCO3 20.1 08/02/2021 0701  ? TCO2 21 (L) 08/02/2021 0701  ? ACIDBASEDEF 4.0 (H) 08/02/2021 0701  ? O2SAT 95 08/02/2021 0701  ? ?  ? ?Coagulation Profile: ?Recent Labs  ?Lab 07/27/21 ?1032 08/01/21 ?1149 08/01/21 ?1532  ?INR 1.1 1.6* 1.7*  ? ? ? ?Cardiac Enzymes: ?No results for input(s): CKTOTAL, CKMB, CKMBINDEX, TROPONINI in the last 168 hours. ? ?HbA1C: ?Hgb A1c MFr Bld  ?Date/Time Value Ref Range Status  ?08/01/2021 07:49 PM 5.5 4.8 - 5.6 % Final  ?  Comment:  ?   (NOTE) ?Pre diabetes:          5.7%-6.4% ? ?Diabetes:              >6.4% ? ?Glycemic control for   <7.0% ?adults with diabetes ?  ?08/24/2014 11:04 AM 5.9 (H) 4.8 - 5.6 % Final  ?  Comment:  ?  (NOTE) ?

## 2021-08-02 NOTE — Progress Notes (Signed)
?[facility] ?Progress Note  ? ? ?Assessment/ Plan:   ?AKI on CKD 3a: BL Crt 1.3. initial AKI likely 2/2 contrast associate kidney injury and large swings in BP w/ low Bps at time. No obstruction on u/s. Now with ruptured dissection  s/p emergent repair, no filling of right renal artery but there is filling of left renal artery at completion with dissection extending into visceral segment. Cr up to 3 today, essentially anuric now ?-Cr worsening especially in light of ruptured dissection and no filling of rt renal artery, will likely require CRRT soon but currently no indication for renal replacement therapy ?-Continue to monitor daily Cr, Dose meds for GFR ?-Monitor Daily I/Os, Daily weight  ?-Maintain MAP>65 for optimal renal perfusion.  ?-Avoid nephrotoxic medications including NSAIDs ?-Use synthetic opioids (Fentanyl/Dilaudid) if needed ?  ?Thoracic dissection with aortic rupture ?-s/p emergent repair 3/20 ?-no filling of right renal artery as above ?-VVS following ? ?Hypotension/shock ?-pressor support per CCM ? ?Metabolic acidosis ?-lactic acid improved/WNL now ?-if continues to trend down then can try for nahco3 but likely approaching the needs for renal replacement therapy ?  ?Cocaine use disorder ?  ?Hep C: s/p treatment ?  ?LFT elevation: likely related to hypotension as well. Improving ?  ?Thrombocytopenia: persistent, per primary service ? ?Subjective:   ?Seen and examined bedside in ICU this AM. FIO2 inc'ed this AM given low O2 sats. Levo titrated off. Very minimal urine output.CVP's have been around 52s as per RN.  ? ?Objective:   ?BP 102/60   Pulse 68   Temp 98 ?F (36.7 ?C) (Axillary)   Resp 20   Ht 5\' 9"  (1.753 m)   Wt 92.2 kg   SpO2 99%   BMI 30.02 kg/m?  ? ?Intake/Output Summary (Last 24 hours) at 08/02/2021 0855 ?Last data filed at 08/02/2021 0700 ?Gross per 24 hour  ?Intake 9280.46 ml  ?Output 513 ml  ?Net 8767.46 ml  ? ?Weight change: 8.2 kg ? ?Physical Exam: ?Gen: ill  appearing, sedated/intubated ?CVS:rrr ?Resp:diminished air entry bibasilar, ETT in place ?HEN:IDPO, distended ?Ext: edema b/l LE's ?Neuro: sedated ? ?Imaging: ?DG Abd 1 View ? ?Result Date: 08/02/2021 ?CLINICAL DATA:  Aortic aneurysm with dissection Abdominal distension EXAM: ABDOMEN - 1 VIEW COMPARISON:  08/02/2021 FINDINGS: Nasogastric tube terminates in the region of the stomach in the left upper quadrant. Aortic endo grafts are seen.  IVC filter is seen. Diffuse small bowel dilatation is unchanged from prior examination. Contrast noted within the bladder lumen. Severe bilateral hip osteoarthrosis. IMPRESSION: Diffuse small bowel dilatation most likely due to ileus or obstruction. Electronically Signed   By: Miachel Roux M.D.   On: 08/02/2021 08:52  ? ?US RENAL ? ?Result Date: 07/31/2021 ?CLINICAL DATA:  Acute kidney injury. EXAM: RENAL / URINARY TRACT ULTRASOUND COMPLETE COMPARISON:  Abdominal ultrasound 12/24/2019; CTA chest abdomen and pelvis 07/27/2021 FINDINGS: Right Kidney: Renal measurements: 11.4 x 7.3 x 5.7 cm = volume: 146 mL. Mildly increased cortical echogenicity. Avascular cyst is seen within the lower pole of the right kidney measuring up to 5.1 cm similar to prior CT. Left Kidney: Renal measurements: 11.2 x 4.9 x 5.2 cm = volume: 149 mL. Mildly increased cortical echogenicity. Superior to the left kidney there is a hypoechoic round nodule measuring up to 10 mm, corresponding to the benign splenule seen on prior CT in between the spleen in the upper pole of the left kidney. Bladder: Appears normal for degree of bladder distention. A left ureteral jet was seen  but a right ureteral jet was not seen. Other: None. IMPRESSION:: IMPRESSION: 1. Avascular cyst within the lower pole of the right kidney measuring up to 5.1 cm. 2. Mildly increased cortical echogenicity bilaterally, as can be seen with medical renal disease. 3. No hydronephrosis within either kidney. Electronically Signed   By: Yvonne Kendall M.D.    On: 07/31/2021 16:06  ? ?DG CHEST PORT 1 VIEW ? ?Result Date: 08/02/2021 ?CLINICAL DATA:  Thoracic aortic repair EXAM: PORTABLE CHEST 1 VIEW COMPARISON:  08/01/2021 FINDINGS: ETT terminates approximately 5.1 cm above the carina. Enteric tube courses below the diaphragm with distal tip beyond the inferior margin of the film. Right IJ central venous catheter stable in positioning. Median sternotomy with prior thoracic aortic endovascular stent. Cardiomediastinal contours are stable from most recent prior study. Increasing small-moderate left-sided pleural effusion. New small-moderate right-sided pleural effusion. Paragraphs of bibasilar airspace opacities. No pneumothorax. IMPRESSION: 1. Increasing small-moderate left-sided pleural effusion and new small-moderate right-sided pleural effusion. 2. Bibasilar airspace opacities, favor atelectasis. 3. Stable support apparatus, as above. Electronically Signed   By: Davina Poke D.O.   On: 08/02/2021 08:51  ? ?Portable Chest x-ray ? ?Result Date: 08/01/2021 ?CLINICAL DATA:  Postop thoracic aneurysm repair EXAM: PORTABLE CHEST 1 VIEW COMPARISON:  Chest x-ray dated August 01, 2021 FINDINGS: ETT tip is approximately 4.0 cm from the carina. Right IJ line tip projects over the expected area of the mid SVC. Enteric tube partially visualized coursing below the diaphragm. Cardiac and mediastinal contours are unchanged. Median sternotomy wires. Interval endovascular stent placement within the descending thoracic aorta. Increased opacities of the left hemithorax, likely due to atelectasis and layering pleural effusion. No evidence of pneumothorax. IMPRESSION: 1. ETT tip is approximately 4.0 cm from the carina. Right IJ line tip projects over the expected area of the mid SVC. Enteric tube partially visualized coursing below the diaphragm. 2. Interval placement of endovascular stent within descending thoracic aorta. 3. Increased opacities of the left hemithorax, likely due to  atelectasis and layering pleural effusion. Electronically Signed   By: Yetta Glassman M.D.   On: 08/01/2021 16:01  ? ?DG CHEST PORT 1 VIEW ? ?Result Date: 08/01/2021 ?CLINICAL DATA:  Shortness of breath EXAM: PORTABLE CHEST 1 VIEW COMPARISON:  Previous studies including the examination of 07/27/2021 FINDINGS: Thoracic aorta is tortuous and ectatic. Metallic sutures seen in the sternum. There are no signs of alveolar pulmonary edema. Increased markings are seen in both lower lung fields. There is poor inspiration. Left lateral CP angle is indistinct. There is no pneumothorax. IMPRESSION: Increased markings in the lower lung fields suggest atelectasis/pneumonia. Left lateral CP angle is indistinct, possibly suggesting small effusion. Electronically Signed   By: Elmer Picker M.D.   On: 08/01/2021 10:18  ? ?CT Angio Chest/Abd/Pel for Dissection W and/or W/WO ? ?Result Date: 08/01/2021 ?CLINICAL DATA:  Concern for progression of dissection EXAM: CT ANGIOGRAPHY CHEST, ABDOMEN AND PELVIS TECHNIQUE: Non-contrast CT of the chest was initially obtained. Multidetector CT imaging through the chest, abdomen and pelvis was performed using the standard protocol during bolus administration of intravenous contrast. Multiplanar reconstructed images and MIPs were obtained and reviewed to evaluate the vascular anatomy. RADIATION DOSE REDUCTION: This exam was performed according to the departmental dose-optimization program which includes automated exposure control, adjustment of the mA and/or kV according to patient size and/or use of iterative reconstruction technique. CONTRAST:  33mL OMNIPAQUE IOHEXOL 350 MG/ML SOLN COMPARISON:  None. FINDINGS: CTA CHEST FINDINGS Cardiovascular: Normal heart size. No pericardial effusion.  Mild calcified plaque of the coronary arteries. No suspicious central filling defects of the pulmonary arteries. Mediastinum/Nodes: New high density stranding adjacent to the esophagus which is compatible  with mediastinal hematoma. No pathologically enlarged lymph nodes seen in the chest. Lungs/Pleura: New small bilateral pleural effusions with atelectasis. No evidence of pneumothorax. Musculoskeletal: Prior median sternoto

## 2021-08-03 ENCOUNTER — Encounter (HOSPITAL_COMMUNITY): Payer: Self-pay | Admitting: Student

## 2021-08-03 DIAGNOSIS — I719 Aortic aneurysm of unspecified site, without rupture: Secondary | ICD-10-CM | POA: Diagnosis not present

## 2021-08-03 DIAGNOSIS — I7100 Dissection of unspecified site of aorta: Secondary | ICD-10-CM | POA: Diagnosis not present

## 2021-08-03 DIAGNOSIS — F1410 Cocaine abuse, uncomplicated: Secondary | ICD-10-CM | POA: Diagnosis not present

## 2021-08-03 DIAGNOSIS — R579 Shock, unspecified: Secondary | ICD-10-CM | POA: Diagnosis not present

## 2021-08-03 LAB — BASIC METABOLIC PANEL
Anion gap: 12 (ref 5–15)
BUN: 54 mg/dL — ABNORMAL HIGH (ref 8–23)
CO2: 17 mmol/L — ABNORMAL LOW (ref 22–32)
Calcium: 7.7 mg/dL — ABNORMAL LOW (ref 8.9–10.3)
Chloride: 109 mmol/L (ref 98–111)
Creatinine, Ser: 3.24 mg/dL — ABNORMAL HIGH (ref 0.61–1.24)
GFR, Estimated: 19 mL/min — ABNORMAL LOW (ref >60)
Glucose, Bld: 79 mg/dL (ref 70–99)
Potassium: 4.2 mmol/L (ref 3.5–5.1)
Sodium: 138 mmol/L (ref 135–145)

## 2021-08-03 LAB — CBC
HCT: 31.3 % — ABNORMAL LOW (ref 39.0–52.0)
Hemoglobin: 10.5 g/dL — ABNORMAL LOW (ref 13.0–17.0)
MCH: 28.9 pg (ref 26.0–34.0)
MCHC: 33.5 g/dL (ref 30.0–36.0)
MCV: 86.2 fL (ref 80.0–100.0)
Platelets: 77 10*3/uL — ABNORMAL LOW (ref 150–400)
RBC: 3.63 MIL/uL — ABNORMAL LOW (ref 4.22–5.81)
RDW: 15.6 % — ABNORMAL HIGH (ref 11.5–15.5)
WBC: 7.3 10*3/uL (ref 4.0–10.5)
nRBC: 0.5 % — ABNORMAL HIGH (ref 0.0–0.2)

## 2021-08-03 LAB — PHOSPHORUS: Phosphorus: 4.4 mg/dL (ref 2.5–4.6)

## 2021-08-03 LAB — GLUCOSE, CAPILLARY
Glucose-Capillary: 88 mg/dL (ref 70–99)
Glucose-Capillary: 82 mg/dL (ref 70–99)
Glucose-Capillary: 94 mg/dL (ref 70–99)
Glucose-Capillary: 88 mg/dL (ref 70–99)
Glucose-Capillary: 90 mg/dL (ref 70–99)

## 2021-08-03 LAB — MAGNESIUM: Magnesium: 2.2 mg/dL (ref 1.7–2.4)

## 2021-08-03 MED ORDER — SODIUM BICARBONATE 650 MG PO TABS
1300.0000 mg | ORAL_TABLET | Freq: Two times a day (BID) | ORAL | Status: DC
  Administered 2021-08-03 – 2021-08-04 (×3): 1300 mg
  Filled 2021-08-03 (×3): qty 2

## 2021-08-03 NOTE — Progress Notes (Addendum)
?Progress Note ? ? ? ?08/03/2021 ?7:32 AM ?2 Days Post-Op ? ?Subjective:  intubated but awake and follows commands. ? ?Tm 100.4 now 99.4 ? ?Gtts: ?Epi ?Neo ?Vasopressin ?Levophed off ?Propofol ?fentanyl ? ? ?Vitals:  ? 08/03/21 0645 08/03/21 0700  ?BP:  139/72  ?Pulse: (!) 54 (!) 55  ?Resp: 20 20  ?Temp:    ?SpO2: 97% 98%  ? ? ?Physical Exam: ?Cardiac:  regular ?Lungs:  intubated .40 FiO2 ?Incisions:  right groin clean and dry ?Extremities:  bounding DP pulses bilaterally; wiggles toes and grips hands bilaterally to command ?Abdomen:  distended; non tender ? ?CBC ?   ?Component Value Date/Time  ? WBC 7.3 08/03/2021 0332  ? RBC 3.63 (L) 08/03/2021 0332  ? HGB 10.5 (L) 08/03/2021 0332  ? HGB 13.8 10/21/2015 1515  ? HCT 31.3 (L) 08/03/2021 0332  ? HCT 39.7 10/21/2015 1515  ? PLT 77 (L) 08/03/2021 0332  ? PLT 139 (L) 10/21/2015 1515  ? MCV 86.2 08/03/2021 0332  ? MCV 88.0 10/21/2015 1515  ? MCH 28.9 08/03/2021 0332  ? MCHC 33.5 08/03/2021 0332  ? RDW 15.6 (H) 08/03/2021 0332  ? RDW 13.3 10/21/2015 1515  ? LYMPHSABS 1.7 08/02/2021 2114  ? LYMPHSABS 2.4 10/21/2015 1515  ? MONOABS 0.5 08/02/2021 2114  ? MONOABS 0.4 10/21/2015 1515  ? EOSABS 0.2 08/02/2021 2114  ? EOSABS 0.4 10/21/2015 1515  ? BASOSABS 0.1 08/02/2021 2114  ? BASOSABS 0.0 10/21/2015 1515  ? ? ?BMET ?   ?Component Value Date/Time  ? NA 138 08/03/2021 0332  ? NA 141 10/21/2015 1516  ? K 4.2 08/03/2021 0332  ? K 4.1 10/21/2015 1516  ? CL 109 08/03/2021 0332  ? CO2 17 (L) 08/03/2021 0332  ? CO2 21 (L) 10/21/2015 1516  ? GLUCOSE 79 08/03/2021 0332  ? GLUCOSE 114 10/21/2015 1516  ? BUN 54 (H) 08/03/2021 0332  ? BUN 21.6 10/21/2015 1516  ? CREATININE 3.24 (H) 08/03/2021 0332  ? CREATININE 1.4 (H) 10/21/2015 1516  ? CALCIUM 7.7 (L) 08/03/2021 0332  ? CALCIUM 9.3 10/21/2015 1516  ? GFRNONAA 19 (L) 08/03/2021 0332  ? GFRAA >90 08/30/2014 0243  ? ? ?INR ?   ?Component Value Date/Time  ? INR 1.7 (H) 08/01/2021 1532  ? ? ? ?Intake/Output Summary (Last 24 hours) at  08/03/2021 0732 ?Last data filed at 08/03/2021 0700 ?Gross per 24 hour  ?Intake 2787.74 ml  ?Output 1080 ml  ?Net 1707.74 ml  ? ? ? ?Assessment/Plan:  71 y.o. male is s/p:  ?emergent repair of descending type b thoracic dissection with aortic rupture  ?2 Days Post-Op ? ? ?-continues to require pressor support-management per primary team ?-AKI on CKD 3a-creatinine unchanged from last evening.  UOP 780cc/24h.   CRRT may be required per nephrology ?-thrombocytopenia drifting downward at 77k this am-continue to monitor.  May need HIT panel per primary team.  Pt on sq heparin for DVT prophylaxis ?-pt with bounding DP pulses bilaterally ?-he is awake this morning and following commands as he is able to wiggles his toes and grip hands bilaterally, which is encouraging as he is at high risk for paralysis given the extent of thoracic and abdominal coverage with endograft.  ? ? ? ?Leontine Locket, PA-C ?[facility] ?[phone] ?08/03/2021 ?7:32 AM ? ?I have seen and evaluated the patient. I agree with the PA note as documented above.  Postop day 2 status post emergent repair of a ruptured descending thoracic dissection including extension of the dissection  into the visceral segment.  Remains critically ill in the ICU and intubated.  Pressors weaned overnight and now on 23 of epinephrine and 0.03 vasopressin.  Hemoglobin stable at 10.5.  Right groin incision is clean dry and intact.  Palpable DP pulses bilaterally.  Urine output improved and nephrology following.  He is moving his lower extremities and denies abdominal pain on the ventilator with no guarding or rebound.  Vascular will follow. ? ?Marty Heck, MD ?[facility] ?Office: [phone] ? ? ? ?

## 2021-08-03 NOTE — Progress Notes (Signed)
?[facility] ?Progress Note  ? ? ?Assessment/ Plan:   ?AKI on CKD 3a: BL Crt 1.3. initial AKI likely 2/2 contrast associate kidney injury and large swings in BP w/ low Bps at time. No obstruction on u/s. Now with ruptured dissection  s/p emergent repair, no filling of right renal artery but there is filling of left renal artery at completion with dissection extending into visceral segment. Cr up to 3.2 today--overall stable ?-fortunately, he is making some more urine now which is reassuring but still high risk for requiring CRRT. Currently with no indication for renal replacement therapy. Can consider lasix trial if volume becomes an issue especially as his net balance is +10.6L since admission ?-Continue to monitor daily Cr, Dose meds for GFR ?-Monitor Daily I/Os, Daily weight  ?-Maintain MAP>65 for optimal renal perfusion.  ?-Avoid nephrotoxic medications including NSAIDs ?-Use synthetic opioids (Fentanyl/Dilaudid) if needed ?  ?Thoracic dissection with aortic rupture ?-s/p emergent repair 3/20 ?-no filling of right renal artery as above ?-VVS following ? ?Hypotension/shock ?-pressor support per CCM ? ?Metabolic acidosis ?-lactic acid improved/WNL now ?-start nahco3 PO ?  ?Cocaine use disorder ?  ?Hep C: s/p treatment ?  ?LFT elevation: likely related to hypotension as well. Improving ?  ?Thrombocytopenia: persistent, per primary service ? ?Subjective:   ?Seen and examined bedside in ICU this AM. Dec'd fio2 requirement, down to 40%. Uop ~780cc. Awake and following commands this am. On epi, phenyl, vaso. Off levo. No acute events.  ? ?Objective:   ?BP (!) 144/65   Pulse (!) 54   Temp 98.9 ?F (37.2 ?C) (Axillary)   Resp 20   Ht 5\' 9"  (1.753 m)   Wt 96.9 kg   SpO2 100%   BMI 31.55 kg/m?  ? ?Intake/Output Summary (Last 24 hours) at 08/03/2021 0856 ?Last data filed at 08/03/2021 0800 ?Gross per 24 hour  ?Intake 2866.77 ml  ?Output 1120 ml  ?Net 1746.77 ml  ? ?Weight change: 4.7 kg ? ?Physical  Exam: ?Gen: ill appearing, intubated ?CVS:rrr ?Resp:diminished air entry bibasilar, ETT in place ?DTO:IZTI, distended ?Ext: edema b/l LE's ?Neuro: awake, sluggish, following commands ? ?Imaging: ?DG Abd 1 View ? ?Result Date: 08/02/2021 ?CLINICAL DATA:  Aortic aneurysm with dissection Abdominal distension EXAM: ABDOMEN - 1 VIEW COMPARISON:  08/02/2021 FINDINGS: Nasogastric tube terminates in the region of the stomach in the left upper quadrant. Aortic endo grafts are seen.  IVC filter is seen. Diffuse small bowel dilatation is unchanged from prior examination. Contrast noted within the bladder lumen. Severe bilateral hip osteoarthrosis. IMPRESSION: Diffuse small bowel dilatation most likely due to ileus or obstruction. Electronically Signed   By: Miachel Roux M.D.   On: 08/02/2021 08:52  ? ?DG CHEST PORT 1 VIEW ? ?Result Date: 08/02/2021 ?CLINICAL DATA:  Thoracic aortic repair EXAM: PORTABLE CHEST 1 VIEW COMPARISON:  08/01/2021 FINDINGS: ETT terminates approximately 5.1 cm above the carina. Enteric tube courses below the diaphragm with distal tip beyond the inferior margin of the film. Right IJ central venous catheter stable in positioning. Median sternotomy with prior thoracic aortic endovascular stent. Cardiomediastinal contours are stable from most recent prior study. Increasing small-moderate left-sided pleural effusion. New small-moderate right-sided pleural effusion. Paragraphs of bibasilar airspace opacities. No pneumothorax. IMPRESSION: 1. Increasing small-moderate left-sided pleural effusion and new small-moderate right-sided pleural effusion. 2. Bibasilar airspace opacities, favor atelectasis. 3. Stable support apparatus, as above. Electronically Signed   By: Davina Poke D.O.   On: 08/02/2021 08:51  ? ?Portable Chest x-ray ? ?  Result Date: 08/01/2021 ?CLINICAL DATA:  Postop thoracic aneurysm repair EXAM: PORTABLE CHEST 1 VIEW COMPARISON:  Chest x-ray dated August 01, 2021 FINDINGS: ETT tip is approximately  4.0 cm from the carina. Right IJ line tip projects over the expected area of the mid SVC. Enteric tube partially visualized coursing below the diaphragm. Cardiac and mediastinal contours are unchanged. Median sternotomy wires. Interval endovascular stent placement within the descending thoracic aorta. Increased opacities of the left hemithorax, likely due to atelectasis and layering pleural effusion. No evidence of pneumothorax. IMPRESSION: 1. ETT tip is approximately 4.0 cm from the carina. Right IJ line tip projects over the expected area of the mid SVC. Enteric tube partially visualized coursing below the diaphragm. 2. Interval placement of endovascular stent within descending thoracic aorta. 3. Increased opacities of the left hemithorax, likely due to atelectasis and layering pleural effusion. Electronically Signed   By: Yetta Glassman M.D.   On: 08/01/2021 16:01  ? ?DG CHEST PORT 1 VIEW ? ?Result Date: 08/01/2021 ?CLINICAL DATA:  Shortness of breath EXAM: PORTABLE CHEST 1 VIEW COMPARISON:  Previous studies including the examination of 07/27/2021 FINDINGS: Thoracic aorta is tortuous and ectatic. Metallic sutures seen in the sternum. There are no signs of alveolar pulmonary edema. Increased markings are seen in both lower lung fields. There is poor inspiration. Left lateral CP angle is indistinct. There is no pneumothorax. IMPRESSION: Increased markings in the lower lung fields suggest atelectasis/pneumonia. Left lateral CP angle is indistinct, possibly suggesting small effusion. Electronically Signed   By: Elmer Picker M.D.   On: 08/01/2021 10:18  ? ?CT Angio Chest/Abd/Pel for Dissection W and/or W/WO ? ?Result Date: 08/01/2021 ?CLINICAL DATA:  Concern for progression of dissection EXAM: CT ANGIOGRAPHY CHEST, ABDOMEN AND PELVIS TECHNIQUE: Non-contrast CT of the chest was initially obtained. Multidetector CT imaging through the chest, abdomen and pelvis was performed using the standard protocol during  bolus administration of intravenous contrast. Multiplanar reconstructed images and MIPs were obtained and reviewed to evaluate the vascular anatomy. RADIATION DOSE REDUCTION: This exam was performed according to the departmental dose-optimization program which includes automated exposure control, adjustment of the mA and/or kV according to patient size and/or use of iterative reconstruction technique. CONTRAST:  22mL OMNIPAQUE IOHEXOL 350 MG/ML SOLN COMPARISON:  None. FINDINGS: CTA CHEST FINDINGS Cardiovascular: Normal heart size. No pericardial effusion. Mild calcified plaque of the coronary arteries. No suspicious central filling defects of the pulmonary arteries. Mediastinum/Nodes: New high density stranding adjacent to the esophagus which is compatible with mediastinal hematoma. No pathologically enlarged lymph nodes seen in the chest. Lungs/Pleura: New small bilateral pleural effusions with atelectasis. No evidence of pneumothorax. Musculoskeletal: Prior median sternotomy with intact sternal wires. No acute osseous abnormality. Review of the MIP images confirms the above findings. CTA ABDOMEN AND PELVIS FINDINGS VASCULAR Aorta: Prior ascending thoracic aorta graft repair with recurrent dissection. Interval increased extent of dissection flap which begins just distal to the takeoff of the left subclavian artery flap extends inferiorly to the right common iliac artery. Fenestration of the dissection flap seen the level of the mid descending thoracic aorta. Increased maximum diameter of the descending thoracic aorta which measures 6.0 x 5.9 cm at the level of the descending thoracic aorta, previously measured up to 4.1 x 4.1 cm at similar location. New severe narrowing of the true lumen which measures 3 mm in diameter at the level of the SMA. New high density stranding is seen about the suprarenal abdominal aorta, compatible with retroperitoneal hematoma. Celiac: New  extension of the dissection flap into the  celiac artery causing severe narrowing. SMA: No extension of the dissection flap into the proximal SMA with severe narrowing at the origin. Renals: Right renal artery arises from the false lumen with no discernible flow

## 2021-08-03 NOTE — Progress Notes (Signed)
Pulmonary critical care attending: ? ?This is a 71 year old gentleman, past medical history of hypertension, hep C, polysubstance abuse, ongoing cocaine use, aortic dissection status post thoracic aortic graft repair presented with recurrent type B dissection.  Patient was taken to the operating room emergently by vascular surgery was found to have extension of the dissection into the visceral segment with rupture into the chest. ? ?Patient was brought back to the intensive care unit intubated mechanical life support remains on multiple vasopressors and critically ill at this time. ? ?BP 123/70   Pulse 65   Temp 99.1 ?F (37.3 ?C) (Axillary)   Resp (!) 23   Ht 5\' 9"  (1.753 m)   Wt 96.9 kg   SpO2 100%   BMI 31.55 kg/m?   ?General: Elderly male intubated on mechanical life support ?HEENT: NCAT, endotracheal tube in place ?Neuro: Sedated on mechanical support ?Heart: Regular rhythm S1-S2 ?Lungs: bilateral mechanically ventilated breath sounds ?Abdomen: Soft nontender nondistended ? ?Labs: ?Serum creatinine elevated, however stable at 3.24 ?Urine output reviewed, is making urine ? ?Assessment: ?Ruptured aortic aneurysm, status post emergent repair ?Shock, vasoplegic shock related to acute blood loss and aortic rupture ?Acute hypoxemic respiratory failure requiring intubation mechanical ventilation ?Acute kidney injury secondary to above ?Ileus ?History of cocaine use ? ?Plan: ?Continue to wean off of vasopressor support as tolerated, remains on epi and vasopressin. ?Appreciate nephrology support. ?Avoid nephrotoxic agents and follow urine output closely. ?Currently no indication for CVVHD at the moment. ?Remains sedated on propofol plus Plendil. ?Goal RASS -1 to -2. ?Hopefully start tube feeds tomorrow. ? ?Case discussed with vascular surgery. ? ?This patient is critically ill with multiple organ system failure; which, requires frequent high complexity decision making, assessment, support, evaluation, and  titration of therapies. This was completed through the application of advanced monitoring technologies and extensive interpretation of multiple databases. During this encounter critical care time was devoted to patient care services described in this note for 50 minutes. ? ?Garner Nash, DO ?[facility] ?08/03/2021 1:18 PM   ? ?

## 2021-08-03 NOTE — Progress Notes (Signed)
? ?NAME:  Edward Hopkins, MRN:  [id number], DOB:  [birth year], LOS: 7 ?ADMISSION DATE:  07/27/2021, CONSULTATION DATE:  07/27/21 ?REFERRING MD:  Francia Greaves - EM, CHIEF COMPLAINT:  Aortic Dissection   ? ?History of Present Illness:  ?71 yo M PMH HTN, Hep C, polysubstance abuse including ongoing cocaine use, Aortic dissection s/p thoracic aortic graft repair, thoracic aortic aneurysm presented to [facility] 3/15 with CC chest pain and CTA revealed recurrent type B dissection with flap beginning distal to takeoff of L subclavian artery and extending to takeoff of SMA.  ? ?Case was discussed with CVTS who recommended medical management so he was admitted to ICU for aggressive blood pressure management. He was transferred to the floor 3/17.  ? ?PCCM was called back on the morning of 3/20 due to acute onset hypotension and associated chest pain, after emergent CTA he was found to have extension of the dissection into the visceral segment with rupture in the chest.  He was taken emergently to the OR by vascular surgery ? ?Pertinent  Medical History  ?HTN ?Hep C  ?Aortic dissection  ?Aortic aneurysm  ?DVT ? ?Significant Hospital Events: ?Including procedures, antibiotic start and stop dates in addition to other pertinent events   ?3/15 Ed for chest and back pain. Found to have recurrent type B dissection. Admit to PCCM  ?3/17 transfer out of ICU ?3/20 aneurysm ruptured; taken emergently for surgery; transferred back to ICU ?3/21 On three pressors, following commands ? ?Interim History / Subjective:  ?No complaints this AM. Pressors down to epi 12 and vaso shock dose. Immediately failed PS wean this morning.  ? ?Objective   ?Blood pressure 130/78, pulse 60, temperature 98.9 ?F (37.2 ?C), temperature source Axillary, resp. rate (!) 24, height 5\' 9"  (1.753 m), weight 96.9 kg, SpO2 100 %. ?CVP:  [6 mmHg-20 mmHg] 10 mmHg  ?Vent Mode: PRVC ?FiO2 (%):  [40 %-50 %] 40 % ?Set Rate:  [20 bmp] 20 bmp ?Vt Set:  [560 mL] 560 mL ?PEEP:  [5 cmH20] 5  cmH20 ?Plateau Pressure:  [19 cmH20-25 cmH20] 19 cmH20  ? ?Intake/Output Summary (Last 24 hours) at 08/03/2021 0950 ?Last data filed at 08/03/2021 0900 ?Gross per 24 hour  ?Intake 2937.36 ml  ?Output 1095 ml  ?Net 1842.36 ml  ? ? ?Filed Weights  ? 08/01/21 0451 08/02/21 0500 08/03/21 0500  ?Weight: 84 kg 92.2 kg 96.9 kg  ? ?Exam: ?General:  elderly appearing male on vent.  ?HEENT: [place]/AT, PERRL, no JVD ?Neuro: awake, follows commands.  ?CV: RRR, no MRG ?PULM:  Diminished bases. Clear vent assisted breaths otherwise.  ?GI: firm, distended, no active BS auscultated ?Extremities: warm/dry, no edema, no skin mottling, equal temperature and palpaple distal pulses ?Skin: no rashes or lesions ? ?Resolved Hospital Problem list   ? ? ?Assessment & Plan:  ? ?Ruptured aortic aneurysm ?3/15 CTA: L subclavian artery to level of SMA takeoff. Fenestration of dissection flap mid descending thoracic aorta ?3/20 repeat CTA with aortic rupture with mediastinal and retroperitoneal hematoma. Extension of dissection flap to IMA and right renal artery ?Hx of aortic dissection status post ascending aortic replacement including aorto innominate and aorto left carotid bypass by Dr. Cameron Ali)  ?3/20 Taken to OR for emergent repair ?-currently on Epi 6mcg, 0.03 Vaso and Neo off ?-wean pressor for MAP goal 65 ?-management per vascular surgery, continue supportive care ?-plan for TTE  ? ?Acute Hypoxic Respiratory Failure: initially post op failure. Now complicated by volume overload in the setting of renal  failure. Pulmonary edema and bilateral effusions.  ?- Full vent support ?- Failed wean today instantly ?- Needs volume off eventually, fortunately UOP improving. ?- No need for HD currently, but at risk. Can try a diuretic challenge first. For now will give his kidneys some time to recover.  ?- Propofol and fentanyl for RASS goal -1 to -2 ? ?Acute Kidney Injury ?-appreciate nephrology recommendations, likely heading towards CRRT, but no  emergent indication right now ?-continue to monitor renal indices and electrolytes , follow UOP and avoid nephrotoxins as able ? ?Ileus  ?-KUB with diffuse SB dilation likely 2/2 ileus or obstruction ?-NG to suction, hold starting TF ?-repeat KUB tomorrow morning.  ? ?Cocaine use disorder ?Bilateral inguinal hernias (L>R) ?-OP follow up  ? ? ?Best Practice (right click and "Reselect all SmartList Selections" daily)  ? ?Diet/type: NPO w/ oral meds ?DVT prophylaxis: SCD ?GI prophylaxis: PPI ?Lines: Arterial Line ?Foley:  Yes, and it is still needed ?Code Status:  full code ?Last date of multidisciplinary goals of care discussion [pending] ? ?Labs   ?CBC: ?Recent Labs  ?Lab 07/27/21 ?1032 07/27/21 ?1055 08/01/21 ?1532 08/01/21 ?1536 08/01/21 ?1949 08/01/21 ?2001 08/01/21 ?2334 08/02/21 ?1610 08/02/21 ?0701 08/02/21 ?2114 08/03/21 ?9604  ?WBC 5.4   < > 12.7*  --  11.0*  --   --  10.1  --  8.7 7.3  ?NEUTROABS 3.3  --   --   --  6.9  --   --   --   --  5.8  --   ?HGB 13.6   < > 12.8*   < > 13.3   < > 12.5* 12.6* 10.9* 11.0* 10.5*  ?HCT 41.3   < > 37.7*   < > 38.8*   < > 37.6* 36.7* 32.0* 32.2* 31.3*  ?MCV 89.8   < > 87.1  --  85.1  --   --  85.0  --  85.4 86.2  ?PLT 115*   < > 84*  --  92*  --   --  81*  --  82* 77*  ? < > = values in this interval not displayed.  ? ? ? ?Basic Metabolic Panel: ?Recent Labs  ?Lab 08/01/21 ?1532 08/01/21 ?1536 08/01/21 ?1949 08/01/21 ?2001 08/02/21 ?5409 08/02/21 ?8119 08/02/21 ?1478 08/02/21 ?2114 08/03/21 ?2956  ?NA 134*   < > 133* 136 138 138  --  137 138  ?K 4.8   < > 4.3 4.4 4.5 4.5  --  4.2 4.2  ?CL 106  --  102  --  108  --   --  109 109  ?CO2 19*  --  19*  --  18*  --   --  17* 17*  ?GLUCOSE 191*  --  223*  --  122*  --   --  73 79  ?BUN 49*  --  54*  --  53*  --   --  55* 54*  ?CREATININE 2.62*  --  3.05*  --  3.02*  --   --  3.25* 3.24*  ?CALCIUM 8.6*  --  8.4*  --  7.9*  --   --  7.7* 7.7*  ?MG 2.2  --  2.2  --   --   --  2.1 2.1 2.2  ?PHOS  --   --   --   --   --   --   --  4.2  4.4  ? < > = values in this interval not displayed.  ? ? ?  GFR: ?Estimated Creatinine Clearance: 23.3 mL/min (A) (by C-G formula based on SCr of 3.24 mg/dL (H)). ?Recent Labs  ?Lab 08/01/21 ?1742 08/01/21 ?1949 08/02/21 ?7096 08/02/21 ?2836 08/02/21 ?2114 08/03/21 ?6294  ?WBC  --  11.0* 10.1  --  8.7 7.3  ?LATICACIDVEN 1.8  --  1.5 1.4  --   --   ? ? ? ?Liver Function Tests: ?Recent Labs  ?Lab 07/29/21 ?0615 07/30/21 ?0230 07/31/21 ?7654 08/01/21 ?1949 08/02/21 ?0315  ?AST 66* 189* 106* 38 35  ?ALT 27 137* 138* 63* 49*  ?ALKPHOS 56 57 52 49 42  ?BILITOT 1.1 1.1 0.8 3.0* 2.8*  ?PROT 6.7 6.7 6.6 5.2* 5.3*  ?ALBUMIN 2.8* 2.6* 2.3* 1.9* 2.1*  ? ? ?Recent Labs  ?Lab 07/27/21 ?1032  ?LIPASE 40  ? ? ?No results for input(s): AMMONIA in the last 168 hours. ? ?ABG ?   ?Component Value Date/Time  ? PHART 7.409 08/02/2021 0701  ? PCO2ART 31.8 (L) 08/02/2021 0701  ? PO2ART 76 (L) 08/02/2021 0701  ? HCO3 20.1 08/02/2021 0701  ? TCO2 21 (L) 08/02/2021 0701  ? ACIDBASEDEF 4.0 (H) 08/02/2021 0701  ? O2SAT 95 08/02/2021 0701  ? ?  ? ?Coagulation Profile: ?Recent Labs  ?Lab 07/27/21 ?1032 08/01/21 ?1149 08/01/21 ?1532  ?INR 1.1 1.6* 1.7*  ? ? ? ?Cardiac Enzymes: ?No results for input(s): CKTOTAL, CKMB, CKMBINDEX, TROPONINI in the last 168 hours. ? ?HbA1C: ?Hgb A1c MFr Bld  ?Date/Time Value Ref Range Status  ?08/01/2021 07:49 PM 5.5 4.8 - 5.6 % Final  ?  Comment:  ?  (NOTE) ?Pre diabetes:          5.7%-6.4% ? ?Diabetes:              >6.4% ? ?Glycemic control for   <7.0% ?adults with diabetes ?  ?08/24/2014 11:04 AM 5.9 (H) 4.8 - 5.6 % Final  ?  Comment:  ?  (NOTE) ?        Pre-diabetes: 5.7 - 6.4 ?        Diabetes: >6.4 ?        Glycemic control for adults with diabetes: <7.0 ?  ? ? ?CBG: ?Recent Labs  ?Lab 08/02/21 ?1729 08/02/21 ?6503 08/02/21 ?2355 08/03/21 ?5465 08/03/21 ?6812  ?GLUCAP 77 83 79 88 82  ? ? ? ?Review of Systems:   ?Unable to be obtained due to critical illness ? ?Past Medical History:  ?He,  has a past medical  history of CHF (congestive heart failure) ([place]), Constipation, DVT (deep venous thrombosis) ([place]) (1995), Endocarditis, Hypertension, Intramural aortic hematoma ([place]), Osteomyelitis ([place]), Right femoral vein DVT (

## 2021-08-04 ENCOUNTER — Inpatient Hospital Stay (HOSPITAL_COMMUNITY): Payer: Medicare Other

## 2021-08-04 DIAGNOSIS — R579 Shock, unspecified: Secondary | ICD-10-CM | POA: Diagnosis not present

## 2021-08-04 DIAGNOSIS — I7100 Dissection of unspecified site of aorta: Secondary | ICD-10-CM | POA: Diagnosis not present

## 2021-08-04 DIAGNOSIS — I719 Aortic aneurysm of unspecified site, without rupture: Secondary | ICD-10-CM | POA: Diagnosis not present

## 2021-08-04 DIAGNOSIS — F1410 Cocaine abuse, uncomplicated: Secondary | ICD-10-CM | POA: Diagnosis not present

## 2021-08-04 LAB — COMPREHENSIVE METABOLIC PANEL
ALT: 11 U/L (ref 0–44)
AST: 29 U/L (ref 15–41)
Albumin: 1.7 g/dL — ABNORMAL LOW (ref 3.5–5.0)
Alkaline Phosphatase: 51 U/L (ref 38–126)
Anion gap: 10 (ref 5–15)
BUN: 52 mg/dL — ABNORMAL HIGH (ref 8–23)
CO2: 16 mmol/L — ABNORMAL LOW (ref 22–32)
Calcium: 7.7 mg/dL — ABNORMAL LOW (ref 8.9–10.3)
Chloride: 113 mmol/L — ABNORMAL HIGH (ref 98–111)
Creatinine, Ser: 2.96 mg/dL — ABNORMAL HIGH (ref 0.61–1.24)
GFR, Estimated: 22 mL/min — ABNORMAL LOW (ref >60)
Glucose, Bld: 100 mg/dL — ABNORMAL HIGH (ref 70–99)
Potassium: 4.1 mmol/L (ref 3.5–5.1)
Sodium: 139 mmol/L (ref 135–145)
Total Bilirubin: 3.9 mg/dL — ABNORMAL HIGH (ref 0.3–1.2)
Total Protein: 5.3 g/dL — ABNORMAL LOW (ref 6.5–8.1)

## 2021-08-04 LAB — GLUCOSE, CAPILLARY
Glucose-Capillary: 105 mg/dL — ABNORMAL HIGH (ref 70–99)
Glucose-Capillary: 106 mg/dL — ABNORMAL HIGH (ref 70–99)
Glucose-Capillary: 43 mg/dL — CL (ref 70–99)
Glucose-Capillary: 74 mg/dL (ref 70–99)
Glucose-Capillary: 77 mg/dL (ref 70–99)
Glucose-Capillary: 82 mg/dL (ref 70–99)
Glucose-Capillary: 88 mg/dL (ref 70–99)
Glucose-Capillary: 94 mg/dL (ref 70–99)

## 2021-08-04 LAB — PHOSPHORUS: Phosphorus: 4 mg/dL (ref 2.5–4.6)

## 2021-08-04 LAB — CBC
HCT: 28.2 % — ABNORMAL LOW (ref 39.0–52.0)
Hemoglobin: 9.3 g/dL — ABNORMAL LOW (ref 13.0–17.0)
MCH: 28.7 pg (ref 26.0–34.0)
MCHC: 33 g/dL (ref 30.0–36.0)
MCV: 87 fL (ref 80.0–100.0)
Platelets: 81 10*3/uL — ABNORMAL LOW (ref 150–400)
RBC: 3.24 MIL/uL — ABNORMAL LOW (ref 4.22–5.81)
RDW: 15.7 % — ABNORMAL HIGH (ref 11.5–15.5)
WBC: 6.5 10*3/uL (ref 4.0–10.5)
nRBC: 0 % (ref 0.0–0.2)

## 2021-08-04 LAB — MAGNESIUM: Magnesium: 2.3 mg/dL (ref 1.7–2.4)

## 2021-08-04 MED ORDER — ORAL CARE MOUTH RINSE
15.0000 mL | Freq: Two times a day (BID) | OROMUCOSAL | Status: DC
  Administered 2021-08-04 – 2021-08-11 (×13): 15 mL via OROMUCOSAL

## 2021-08-04 MED ORDER — SODIUM BICARBONATE 650 MG PO TABS
1300.0000 mg | ORAL_TABLET | Freq: Three times a day (TID) | ORAL | Status: DC
  Administered 2021-08-04 – 2021-08-05 (×4): 1300 mg
  Filled 2021-08-04 (×3): qty 2

## 2021-08-04 MED ORDER — OXYCODONE HCL 5 MG PO TABS
5.0000 mg | ORAL_TABLET | Freq: Four times a day (QID) | ORAL | Status: DC | PRN
  Administered 2021-08-04: 10 mg via ORAL
  Administered 2021-08-04: 5 mg via ORAL
  Administered 2021-08-05 – 2021-08-11 (×11): 10 mg via ORAL
  Filled 2021-08-04 (×13): qty 2

## 2021-08-04 MED ORDER — FENTANYL CITRATE PF 50 MCG/ML IJ SOSY
25.0000 ug | PREFILLED_SYRINGE | INTRAMUSCULAR | Status: DC | PRN
  Administered 2021-08-04 (×2): 25 ug via INTRAVENOUS
  Filled 2021-08-04 (×3): qty 1

## 2021-08-04 NOTE — Progress Notes (Signed)
? ?NAME:  Edward Hopkins, MRN:  [id number], DOB:  [birth year], LOS: 8 ?ADMISSION DATE:  07/27/2021, CONSULTATION DATE:  07/27/21 ?REFERRING MD:  Francia Greaves - EM, CHIEF COMPLAINT:  Aortic Dissection   ? ?History of Present Illness:  ?71 yo M PMH HTN, Hep C, polysubstance abuse including ongoing cocaine use, Aortic dissection s/p thoracic aortic graft repair, thoracic aortic aneurysm presented to [facility] 3/15 with CC chest pain and CTA revealed recurrent type B dissection with flap beginning distal to takeoff of L subclavian artery and extending to takeoff of SMA.  ? ?Case was discussed with CVTS who recommended medical management so he was admitted to ICU for aggressive blood pressure management. He was transferred to the floor 3/17.  ? ?PCCM was called back on the morning of 3/20 due to acute onset hypotension and associated chest pain, after emergent CTA he was found to have extension of the dissection into the visceral segment with rupture in the chest.  He was taken emergently to the OR by vascular surgery ? ?Pertinent  Medical History  ?HTN ?Hep C  ?Aortic dissection  ?Aortic aneurysm  ?DVT ? ?Significant Hospital Events: ?Including procedures, antibiotic start and stop dates in addition to other pertinent events   ?3/15 Ed for chest and back pain. Found to have recurrent type B dissection. Admit to PCCM  ?3/17 transfer out of ICU ?3/20 aneurysm ruptured; taken emergently for surgery; transferred back to ICU ?3/21 On three pressors, following commands ? ?Interim History / Subjective:  ? ?Tolerating pressure support trial.  Remains on vasopressors.  Critically ill ? ?Objective   ?Blood pressure 110/63, pulse 67, temperature 97.9 ?F (36.6 ?C), temperature source Oral, resp. rate (!) 22, height 5\' 9"  (1.753 m), weight 100.5 kg, SpO2 98 %. ?CVP:  [4 mmHg-19 mmHg] 19 mmHg  ?Vent Mode: CPAP;PSV ?FiO2 (%):  [40 %] 40 % ?Set Rate:  [20 bmp] 20 bmp ?Vt Set:  [560 mL-650 mL] 650 mL ?PEEP:  [5 cmH20] 5 cmH20 ?Pressure Support:  [8  VZC58-85 cmH20] 8 cmH20 ?Plateau Pressure:  [18 OYD74-12 cmH20] 22 cmH20  ? ?Intake/Output Summary (Last 24 hours) at 08/04/2021 0921 ?Last data filed at 08/04/2021 0800 ?Gross per 24 hour  ?Intake 1160.97 ml  ?Output 1110 ml  ?Net 50.97 ml  ? ?Filed Weights  ? 08/02/21 0500 08/03/21 0500 08/04/21 0427  ?Weight: 92.2 kg 96.9 kg 100.5 kg  ? ?Exam: ?General: Elderly gentleman intubated on mechanical life support ?HEENT: NCAT, tracking appropriately ?Neuro: Awake alert following commands ?CV: Regular rate rhythm, S1-S2 ?PULM: Bilateral mechanically ventilated breath sounds ?GI: Mildly distended bowel sounds present ?Extremities: Dependent edema ?Skin: No rash ? ?Resolved Hospital Problem list   ? ? ?Assessment & Plan:  ? ?Ruptured aortic aneurysm ?3/15 CTA: L subclavian artery to level of SMA takeoff. Fenestration of dissection flap mid descending thoracic aorta ?3/20 repeat CTA with aortic rupture with mediastinal and retroperitoneal hematoma. Extension of dissection flap to IMA and right renal artery ?Hx of aortic dissection status post ascending aortic replacement including aorto innominate and aorto left carotid bypass by Dr. Cameron Ali)  ?3/20 Taken to OR for emergent repair ?Plan: ?Continue to wean from vasopressors ?Mean arterial pressure goal greater than 65. ?Postop surgical management per vascular surgery. ? ?Acute Hypoxic Respiratory Failure: initially post op failure. Now complicated by volume overload in the setting of renal failure. Pulmonary edema and bilateral effusions.  ?Plan: ?Continue full vent support, tolerating SAT SBT. ?We will likely give trial of liberation from ventilator today. ?  We will need to remain in the ICU for close observation postextubation for increased risk of need for reintubation and mechanical support. ?I do suspect he will do okay after extubation if needed we can always use BiPAP for support. ? ?Acute Kidney Injury ?Plan: ?Follow renal panel. ?Follow urine output ?Hopefully we  can hold off on any need for CVVHD. ? ?Ileus  ?-KUB with diffuse SB dilation likely 2/2 ileus or obstruction ?-NG to suction, hold starting TF ?-repeat KUB tomorrow morning.  ?-Creatinine improving ? ?Cocaine use disorder ?Bilateral inguinal hernias (L>R) ?Outpatient follow-up ? ? ?Best Practice (right click and "Reselect all SmartList Selections" daily)  ? ?Diet/type: NPO w/ oral meds ?DVT prophylaxis: SCD ?GI prophylaxis: PPI ?Lines: Arterial Line ?Foley:  Yes, and it is still needed ?Code Status:  full code ?Last date of multidisciplinary goals of care discussion [pending] ? ?Labs   ?CBC: ?Recent Labs  ?Lab 08/01/21 ?1949 08/01/21 ?2001 08/02/21 ?0737 08/02/21 ?0701 08/02/21 ?2114 08/03/21 ?1062 08/04/21 ?0248  ?WBC 11.0*  --  10.1  --  8.7 7.3 6.5  ?NEUTROABS 6.9  --   --   --  5.8  --   --   ?HGB 13.3   < > 12.6* 10.9* 11.0* 10.5* 9.3*  ?HCT 38.8*   < > 36.7* 32.0* 32.2* 31.3* 28.2*  ?MCV 85.1  --  85.0  --  85.4 86.2 87.0  ?PLT 92*  --  81*  --  82* 77* 81*  ? < > = values in this interval not displayed.  ? ? ?Basic Metabolic Panel: ?Recent Labs  ?Lab 08/01/21 ?1949 08/01/21 ?2001 08/02/21 ?6948 08/02/21 ?5462 08/02/21 ?7035 08/02/21 ?2114 08/03/21 ?0093 08/04/21 ?8182  ?NA 133*   < > 138 138  --  137 138 139  ?K 4.3   < > 4.5 4.5  --  4.2 4.2 4.1  ?CL 102  --  108  --   --  109 109 113*  ?CO2 19*  --  18*  --   --  17* 17* 16*  ?GLUCOSE 223*  --  122*  --   --  73 79 100*  ?BUN 54*  --  53*  --   --  55* 54* 52*  ?CREATININE 3.05*  --  3.02*  --   --  3.25* 3.24* 2.96*  ?CALCIUM 8.4*  --  7.9*  --   --  7.7* 7.7* 7.7*  ?MG 2.2  --   --   --  2.1 2.1 2.2 2.3  ?PHOS  --   --   --   --   --  4.2 4.4 4.0  ? < > = values in this interval not displayed.  ? ?GFR: ?Estimated Creatinine Clearance: 26 mL/min (A) (by C-G formula based on SCr of 2.96 mg/dL (H)). ?Recent Labs  ?Lab 08/01/21 ?1742 08/01/21 ?1949 08/02/21 ?9937 08/02/21 ?1696 08/02/21 ?2114 08/03/21 ?7893 08/04/21 ?0248  ?WBC  --    < > 10.1  --  8.7 7.3  6.5  ?LATICACIDVEN 1.8  --  1.5 1.4  --   --   --   ? < > = values in this interval not displayed.  ? ? ?Liver Function Tests: ?Recent Labs  ?Lab 07/30/21 ?0230 07/31/21 ?8101 08/01/21 ?1949 08/02/21 ?7510 08/04/21 ?0248  ?AST 189* 106* 38 35 29  ?ALT 137* 138* 63* 49* 11  ?ALKPHOS 25 85 27 78 24  ?BILITOT 1.1 0.8 3.0* 2.8* 3.9*  ?PROT 6.7 6.6 5.2* 5.3* 5.3*  ?  ALBUMIN 2.6* 2.3* 1.9* 2.1* 1.7*  ? ?No results for input(s): LIPASE, AMYLASE in the last 168 hours. ?No results for input(s): AMMONIA in the last 168 hours. ? ?ABG ?   ?Component Value Date/Time  ? PHART 7.409 08/02/2021 0701  ? PCO2ART 31.8 (L) 08/02/2021 0701  ? PO2ART 76 (L) 08/02/2021 0701  ? HCO3 20.1 08/02/2021 0701  ? TCO2 21 (L) 08/02/2021 0701  ? ACIDBASEDEF 4.0 (H) 08/02/2021 0701  ? O2SAT 95 08/02/2021 0701  ? ?  ? ?Coagulation Profile: ?Recent Labs  ?Lab 08/01/21 ?1149 08/01/21 ?1532  ?INR 1.6* 1.7*  ? ? ?Cardiac Enzymes: ?No results for input(s): CKTOTAL, CKMB, CKMBINDEX, TROPONINI in the last 168 hours. ? ?HbA1C: ?Hgb A1c MFr Bld  ?Date/Time Value Ref Range Status  ?08/01/2021 07:49 PM 5.5 4.8 - 5.6 % Final  ?  Comment:  ?  (NOTE) ?Pre diabetes:          5.7%-6.4% ? ?Diabetes:              >6.4% ? ?Glycemic control for   <7.0% ?adults with diabetes ?  ?08/24/2014 11:04 AM 5.9 (H) 4.8 - 5.6 % Final  ?  Comment:  ?  (NOTE) ?        Pre-diabetes: 5.7 - 6.4 ?        Diabetes: >6.4 ?        Glycemic control for adults with diabetes: <7.0 ?  ? ? ?CBG: ?Recent Labs  ?Lab 08/03/21 ?1658 08/03/21 ?1947 08/04/21 ?0923 08/04/21 ?3007 08/04/21 ?0750  ?GLUCAP 88 90 105* 106* 94  ? ? ?Review of Systems:   ?Unable to be obtained due to critical illness ? ?Past Medical History:  ?He,  has a past medical history of CHF (congestive heart failure) ([place]), Constipation, DVT (deep venous thrombosis) ([place]) (1995), Endocarditis, Hypertension, Intramural aortic hematoma ([place]), Osteomyelitis ([place]), Right femoral vein DVT ([place]) (07/2014), and Septic arthritis ([place]).   ? ?Surgical History:  ? ?Past Surgical History:  ?Procedure Laterality Date  ? ABDOMINAL AORTIC ENDOVASCULAR STENT GRAFT N/A 08/01/2021  ? Procedure: ABDOMINAL AORTIC ENDOVASCULAR STENT GRAFT;  Surgeon: Carlis Abbott,

## 2021-08-04 NOTE — Progress Notes (Signed)
?[facility] ?Progress Note  ? ? ?Assessment/ Plan:   ?AKI on CKD 3a: BL Crt 1.3. initial AKI likely 2/2 contrast associate kidney injury and large swings in BP w/ low Bps at time. No obstruction on u/s. Now with ruptured dissection  s/p emergent repair, no filling of right renal artery but there is filling of left renal artery at completion with dissection extending into visceral segment. Cr down to 3 today--overall stable ?-fortunately, he is making some more urine now and Cr is stable which is reassuring. Currently with no indication for renal replacement therapy. Hopefully can avoid renal replacement therapy ?-Continue to monitor daily Cr, Dose meds for GFR ?-Monitor Daily I/Os, Daily weight  ?-Maintain MAP>65 for optimal renal perfusion.  ?-Avoid nephrotoxic medications including NSAIDs ?-Use synthetic opioids (Fentanyl/Dilaudid) if needed ?  ?Thoracic dissection with aortic rupture ?-s/p emergent repair 3/20 ?-no filling of right renal artery as above ?-VVS following ? ?AHRF ?-per PCCM, working towards extubation ? ?Hypotension/shock ?-pressor support per CCM ? ?Metabolic acidosis ?-lactic acid improved/WNL now ?-started nahco3 PO-increased to TID ?  ?Cocaine use disorder ?  ?Hep C: s/p treatment ?  ?LFT elevation: likely related to hypotension as well. Improving ?  ?Thrombocytopenia: persistent, per primary service ? ?Subjective:   ?Seen and examined bedside in ICU this AM. Pressor support down to vaso and epi. Uop 875cc (improved). He remains on the ventilator but awake. Gave me a thumbs up.  ? ?Objective:   ?BP 110/63   Pulse 67   Temp 97.9 ?F (36.6 ?C) (Oral)   Resp (!) 22   Ht '5\' 9"'  (1.753 m)   Wt 100.5 kg   SpO2 98%   BMI 32.72 kg/m?  ? ?Intake/Output Summary (Last 24 hours) at 08/04/2021 0954 ?Last data filed at 08/04/2021 0800 ?Gross per 24 hour  ?Intake 1160.97 ml  ?Output 1110 ml  ?Net 50.97 ml  ? ?Weight change: 3.6 kg ? ?Physical Exam: ?Gen: ill appearing,  intubated ?CVS:rrr ?Resp:diminished air entry bibasilar, ETT in place ?GGE:ZMOQ, distended ?Ext: edema b/l LE's ?Neuro: awake, sluggish, following commands ? ?Imaging: ?DG Abd Portable 1V ? ?Result Date: 08/04/2021 ?CLINICAL DATA:  Follow-up ileus versus small-bowel obstruction. EXAM: PORTABLE ABDOMEN - 1 VIEW COMPARISON:  Study of 08/02/2021. FINDINGS: Flat plate AP exam was obtained in 2 films at 5:20 a.m., 08/04/2021. Abnormal small-bowel pattern pattern continues to be seen. Small bowel loops in the low central abdomen dilated up to 4.8 cm, previously 4.6 cm. The degree of distention is not significantly changed but the number of visible dilated bowel loops is less than previously. Gas and stool are noted in the colon into the rectum. There is no dilatation of the colon. There is gas distention of the transverse segment which is similar to the last study. IVC filter and aortic endografts are redemonstrated as well as contrast in the bladder. There is no supine evidence of free air.  Stable visceral shadows. Markedly severe bilateral hip DJD is again shown. IMPRESSION: The degree of small-bowel dilatation is not significantly changed but the number of visible dilated small bowel loops is less today, which could suggest fluid filling of the previously dilated loops or interval decompression. In all other respects no further changes. Electronically Signed   By: Telford Nab M.D.   On: 08/04/2021 06:36  ? ?ECHOCARDIOGRAM LIMITED ? ?Result Date: 08/02/2021 ?   ECHOCARDIOGRAM LIMITED REPORT   Patient Name:   Edward Hopkins Date of Exam: 08/02/2021 Medical Rec #:  [id number]  Height:       69.0 in Accession #:    [id number]    Weight:       203.3 lb Date of Birth:  10-14-1950     BSA:          2.080 m? Patient Age:    71 years      BP:           131/60 mmHg Patient Gender: M             HR:           62 bpm. Exam Location:  Inpatient Procedure: Limited Color Doppler and Limited Echo Indications:    Dissection of the  Aorta  History:        Patient has prior history of Echocardiogram examinations, most                 recent 07/28/2021. CHF; Risk Factors:Hypertension. 08/27/14 Asc.                 Ao replacement.  Sonographer:    Luisa Hart RDCS Referring Phys: [id number] [place]  1. Left ventricular ejection fraction, by estimation, is 60 to 65%. The left ventricle has normal function. The left ventricle demonstrates regional wall motion abnormalities (abnormal basal inferolateral wall motion with preserved thickening).  2. Aortic root/ascending aorta has been repaired/replaced and dilatation noted. There is mild dilatation of the ascending aorta, measuring 41 mm. There is no evidence of aortic dissection s/p TEVAR extending into the aortic root.  3. Right ventricular systolic function is normal. The right ventricular size is normal.  4. No evidence of mitral valve regurgitation.  5. The aortic valve is tricuspid. Aortic valve regurgitation is trivial. Aortic valve sclerosis is present, with no evidence of aortic valve stenosis. Comparison(s): Similar to prior. FINDINGS  Left Ventricle: Left ventricular ejection fraction, by estimation, is 60 to 65%. The left ventricle has normal function. The left ventricle demonstrates regional wall motion abnormalities. The left ventricular internal cavity size was small.  LV Wall Scoring: The basal inferolateral segment is hypokinetic. Right Ventricle: The right ventricular size is normal. Right ventricular systolic function is normal. Pericardium: Trivial pericardial effusion is present. Aortic Valve: The aortic valve is tricuspid. Aortic valve regurgitation is trivial. Aortic valve sclerosis is present, with no evidence of aortic valve stenosis. Pulmonic Valve: The pulmonic valve was not well visualized. Pulmonic valve regurgitation is mild. No evidence of pulmonic stenosis. Aorta: The aortic root/ascending aorta has been repaired/replaced, the aortic root is normal in  size and structure and aortic dilatation noted. There is mild dilatation of the ascending aorta, measuring 41 mm. LEFT VENTRICLE PLAX 2D LVIDd:         3.50 cm LVIDs:         3.50 cm LV PW:         1.20 cm LV IVS:        1.50 cm   AORTA Ao Root diam: 3.30 cm Ao Asc diam:  3.70 cm Rudean Haskell MD Electronically signed by Rudean Haskell MD Signature Date/Time: 08/02/2021/2:01:59 PM    Final    ? ?Labs: ?BMET ?Recent Labs  ?Lab 08/01/21 ?0426 08/01/21 ?1229 08/01/21 ?1532 08/01/21 ?1536 08/01/21 ?1949 08/01/21 ?2001 08/02/21 ?7253 08/02/21 ?0701 08/02/21 ?2114 08/03/21 ?6644 08/04/21 ?0248  ?NA 137   < > 134*   < > 133* 136 138 138 137 138 139  ?K 3.9   < > 4.8   < > 4.3  4.4 4.5 4.5 4.2 4.2 4.1  ?CL 104  --  106  --  102  --  108  --  109 109 113*  ?CO2 23  --  19*  --  19*  --  18*  --  17* 17* 16*  ?GLUCOSE 86  --  191*  --  223*  --  122*  --  73 79 100*  ?BUN 49*  --  49*  --  54*  --  53*  --  55* 54* 52*  ?CREATININE 2.61*  --  2.62*  --  3.05*  --  3.02*  --  3.25* 3.24* 2.96*  ?CALCIUM 8.5*  --  8.6*  --  8.4*  --  7.9*  --  7.7* 7.7* 7.7*  ?PHOS  --   --   --   --   --   --   --   --  4.2 4.4 4.0  ? < > = values in this interval not displayed.  ? ?CBC ?Recent Labs  ?Lab 08/01/21 ?1949 08/01/21 ?2001 08/02/21 ?8828 08/02/21 ?0701 08/02/21 ?2114 08/03/21 ?0034 08/04/21 ?0248  ?WBC 11.0*  --  10.1  --  8.7 7.3 6.5  ?NEUTROABS 6.9  --   --   --  5.8  --   --   ?HGB 13.3   < > 12.6* 10.9* 11.0* 10.5* 9.3*  ?HCT 38.8*   < > 36.7* 32.0* 32.2* 31.3* 28.2*  ?MCV 85.1  --  85.0  --  85.4 86.2 87.0  ?PLT 92*  --  81*  --  82* 77* 81*  ? < > = values in this interval not displayed.  ? ? ?Medications:   ? ? sodium chloride   Intravenous Once  ? bisacodyl  10 mg Rectal Daily  ? chlorhexidine gluconate (MEDLINE KIT)  15 mL Mouth Rinse BID  ? Chlorhexidine Gluconate Cloth  6 each Topical Daily  ? docusate  100 mg Per Tube BID  ? folic acid  1 mg Oral Daily  ? heparin  5,000 Units Subcutaneous Q8H  ? insulin  aspart  0-15 Units Subcutaneous Q4H  ? mouth rinse  15 mL Mouth Rinse 10 times per day  ? multivitamin with minerals  1 tablet Oral Daily  ? pantoprazole (PROTONIX) IV  40 mg Intravenous Daily  ? polyethylene glycol  17 g Per Tube Daily  ?

## 2021-08-04 NOTE — Procedures (Signed)
Extubation Procedure Note ? ?Patient Details:   ?Name: Edward Hopkins ?DOB: 1950/08/23 ?MRN: [id number] ?  ?Airway Documentation:  ?  ?Vent end date: 08/04/21 Vent end time: 0830  ? ?Evaluation ? O2 sats: stable throughout ?Complications: No apparent complications ?Patient did tolerate procedure well. ?Bilateral Breath Sounds: Diminished, Rhonchi ?  ?Yes placed on 3L [place] Tolerated well able to state his name.  Good. Strong productive cough. ? ?Ned Grace ?08/04/2021, 8:37 AM ? ?

## 2021-08-04 NOTE — Progress Notes (Signed)
[facility] [place] ? ?Subjective  -remains intubated.  No acute events overnight.  No abdominal pain and moving his lower extremities.  Epi weaned to 6 and vaso stable at 0.03. ? ? ?Objective ?102/75 ?62 ?99.3 ?F (37.4 ?C) (Axillary) ?14 ?99% ? ?Intake/Output Summary (Last 24 hours) at 08/04/2021 0749 ?Last data filed at 08/04/2021 0500 ?Gross per 24 hour  ?Intake 1187.29 ml  ?Output 1075 ml  ?Net 112.29 ml  ? ? ?Right groin incision clean dry and intact ?Abdomen distended but soft.  No pain with palpation and no rebound or guarding and shakes his head "no" to abdominal pain while intubated. ?Bilateral DP pulses palpable ?Both feet motor intact and wiggling his toes. ? ?Laboratory ?Lab Results: ?Recent Labs  ?  08/03/21 ?0332 08/04/21 ?0248  ?WBC 7.3 6.5  ?HGB 10.5* 9.3*  ?HCT 31.3* 28.2*  ?PLT 77* 81*  ? ?BMET ?Recent Labs  ?  08/03/21 ?0332 08/04/21 ?0248  ?NA 138 139  ?K 4.2 4.1  ?CL 109 113*  ?CO2 17* 16*  ?GLUCOSE 79 100*  ?BUN 54* 52*  ?CREATININE 3.24* 2.96*  ?CALCIUM 7.7* 7.7*  ? ? ?COAG ?Lab Results  ?Component Value Date  ? INR 1.7 (H) 08/01/2021  ? INR 1.6 (H) 08/01/2021  ? INR 1.1 07/27/2021  ? ?No results found for: PTT ? ?Assessment/Planning: ? ?Postop day 3 status post emergent repair of a ruptured descending thoracic dissection including extension of the dissection into the visceral segment with hemorrhagic shock.  Remains critically ill in the ICU and intubated.  Pressors continue to be weaned overnight and now on 6 of epinephrine and 0.03 vasopressin.  Hemoglobin 10.5 --> 9.3.  Right groin incision is clean dry and intact.  Palpable DP pulses bilaterally.  Urine output improved 875 mL over past 24 hours and nephrology following.  Cr improved from 3.24 --> 2.96.  He is moving his lower extremities and denies abdominal pain on the ventilator with no guarding or rebound.  Vascular will follow.  Overall continues to show clinical improvement.  On SBT this morning with goal of  possible extubation today. ?  ? ?Marty Heck ?08/04/2021 ?7:49 AM ?-- ? ? ?

## 2021-08-05 ENCOUNTER — Inpatient Hospital Stay (HOSPITAL_COMMUNITY): Payer: Medicare Other

## 2021-08-05 ENCOUNTER — Encounter (HOSPITAL_COMMUNITY): Payer: Self-pay | Admitting: Student

## 2021-08-05 DIAGNOSIS — I71019 Dissection of thoracic aorta, unspecified: Secondary | ICD-10-CM | POA: Diagnosis not present

## 2021-08-05 DIAGNOSIS — R579 Shock, unspecified: Secondary | ICD-10-CM | POA: Diagnosis not present

## 2021-08-05 LAB — CBC
HCT: 30.3 % — ABNORMAL LOW (ref 39.0–52.0)
Hemoglobin: 9.7 g/dL — ABNORMAL LOW (ref 13.0–17.0)
MCH: 28.4 pg (ref 26.0–34.0)
MCHC: 32 g/dL (ref 30.0–36.0)
MCV: 88.6 fL (ref 80.0–100.0)
Platelets: 97 10*3/uL — ABNORMAL LOW (ref 150–400)
RBC: 3.42 MIL/uL — ABNORMAL LOW (ref 4.22–5.81)
RDW: 15.9 % — ABNORMAL HIGH (ref 11.5–15.5)
WBC: 7.5 10*3/uL (ref 4.0–10.5)
nRBC: 0.3 % — ABNORMAL HIGH (ref 0.0–0.2)

## 2021-08-05 LAB — COMPREHENSIVE METABOLIC PANEL
ALT: 11 U/L (ref 0–44)
AST: 29 U/L (ref 15–41)
Albumin: 1.8 g/dL — ABNORMAL LOW (ref 3.5–5.0)
Alkaline Phosphatase: 56 U/L (ref 38–126)
Anion gap: 9 (ref 5–15)
BUN: 48 mg/dL — ABNORMAL HIGH (ref 8–23)
CO2: 19 mmol/L — ABNORMAL LOW (ref 22–32)
Calcium: 7.9 mg/dL — ABNORMAL LOW (ref 8.9–10.3)
Chloride: 113 mmol/L — ABNORMAL HIGH (ref 98–111)
Creatinine, Ser: 2.83 mg/dL — ABNORMAL HIGH (ref 0.61–1.24)
GFR, Estimated: 23 mL/min — ABNORMAL LOW (ref >60)
Glucose, Bld: 91 mg/dL (ref 70–99)
Potassium: 3.8 mmol/L (ref 3.5–5.1)
Sodium: 141 mmol/L (ref 135–145)
Total Bilirubin: 3.7 mg/dL — ABNORMAL HIGH (ref 0.3–1.2)
Total Protein: 5.6 g/dL — ABNORMAL LOW (ref 6.5–8.1)

## 2021-08-05 LAB — GLUCOSE, CAPILLARY
Glucose-Capillary: 82 mg/dL (ref 70–99)
Glucose-Capillary: 66 mg/dL — ABNORMAL LOW (ref 70–99)
Glucose-Capillary: 70 mg/dL (ref 70–99)
Glucose-Capillary: 77 mg/dL (ref 70–99)
Glucose-Capillary: 87 mg/dL (ref 70–99)
Glucose-Capillary: 74 mg/dL (ref 70–99)

## 2021-08-05 LAB — BPAM RBC
Blood Product Expiration Date: 202304142359
Blood Product Expiration Date: 202304142359
Blood Product Expiration Date: 202304142359
Blood Product Expiration Date: 202304162359
Blood Product Expiration Date: 202304162359
Blood Product Expiration Date: 202304162359
Blood Product Expiration Date: 202304162359
Blood Product Expiration Date: 202304162359
ISSUE DATE / TIME: 202303201205
ISSUE DATE / TIME: 202303201205
ISSUE DATE / TIME: 202303201205
ISSUE DATE / TIME: 202303201205
ISSUE DATE / TIME: 202303201250
ISSUE DATE / TIME: 202303211608
ISSUE DATE / TIME: 202303211753
Unit Type and Rh: 5100
Unit Type and Rh: 5100
Unit Type and Rh: 5100
Unit Type and Rh: 5100
Unit Type and Rh: 7300
Unit Type and Rh: 7300
Unit Type and Rh: 7300
Unit Type and Rh: 7300

## 2021-08-05 LAB — TYPE AND SCREEN
ABO/RH(D): B POS
Antibody Screen: NEGATIVE
Unit division: 0
Unit division: 0
Unit division: 0
Unit division: 0
Unit division: 0
Unit division: 0
Unit division: 0
Unit division: 0

## 2021-08-05 LAB — LACTIC ACID, PLASMA: Lactic Acid, Venous: 0.7 mmol/L (ref 0.5–1.9)

## 2021-08-05 MED ORDER — INSULIN ASPART 100 UNIT/ML IJ SOLN
0.0000 [IU] | Freq: Three times a day (TID) | INTRAMUSCULAR | Status: DC
  Administered 2021-08-09: 3 [IU] via SUBCUTANEOUS
  Administered 2021-08-09: 2 [IU] via SUBCUTANEOUS
  Administered 2021-08-10: 3 [IU] via SUBCUTANEOUS

## 2021-08-05 MED ORDER — SODIUM BICARBONATE 650 MG PO TABS
1300.0000 mg | ORAL_TABLET | Freq: Three times a day (TID) | ORAL | Status: DC
  Administered 2021-08-05 – 2021-08-07 (×7): 1300 mg via ORAL
  Filled 2021-08-05 (×8): qty 2

## 2021-08-05 MED ORDER — POLYETHYLENE GLYCOL 3350 17 G PO PACK
17.0000 g | PACK | Freq: Every day | ORAL | Status: DC
  Administered 2021-08-06 – 2021-08-11 (×5): 17 g via ORAL
  Filled 2021-08-05 (×5): qty 1

## 2021-08-05 MED ORDER — DOCUSATE SODIUM 100 MG PO CAPS
100.0000 mg | ORAL_CAPSULE | Freq: Two times a day (BID) | ORAL | Status: DC
  Administered 2021-08-05 – 2021-08-11 (×9): 100 mg via ORAL
  Filled 2021-08-05 (×10): qty 1

## 2021-08-05 NOTE — Progress Notes (Signed)
Pharmacy Antibiotic Note ? ?Edward Hopkins is a 71 y.o. male  s/p OR 3/20 for emergent repair of an aortic dissection on pressors. Pharmacy consulted to dose cefepime for empiric coverage ?-day 4 of antibiotics ?-CrCl ~26 ? ? ?Plan: ?-Cefepime 2gm IV q24h ?-Will follow renal function, cultures and clinical progress ? ? ?Height: 5\' 9"  (175.3 cm) ?Weight: 97.9 kg (215 lb 13.3 oz) ?IBW/kg (Calculated) : 70.7 ? ?Temp (24hrs), Avg:98.1 ?F (36.7 ?C), Min:97.9 ?F (36.6 ?C), Max:98.3 ?F (36.8 ?C) ? ?Recent Labs  ?Lab 08/01/21 ?1742 08/01/21 ?1949 08/02/21 ?0923 08/02/21 ?3007 08/02/21 ?2114 08/03/21 ?6226 08/04/21 ?3335 08/05/21 ?4562 08/05/21 ?0440  ?WBC  --    < > 10.1  --  8.7 7.3 6.5 7.5  --   ?CREATININE  --    < > 3.02*  --  3.25* 3.24* 2.96* 2.83*  --   ?LATICACIDVEN 1.8  --  1.5 1.4  --   --   --   --  0.7  ? < > = values in this interval not displayed.  ? ?  ?Estimated Creatinine Clearance: 26.8 mL/min (A) (by C-G formula based on SCr of 2.83 mg/dL (H)).   ? ?No Known Allergies ? ?Antimicrobials this admission: ?3/21 cefepime>> ? ?Dose adjustments this admission: ? ? ?Microbiology results: ? ? ?Thank you for allowing pharmacy to be a part of this patient?s care. ? ?Hildred Laser, PharmD ?Clinical Pharmacist ?**Pharmacist phone directory can now be found on [email or web address] ([email or web address]).  Listed under [place]. ? ? ?

## 2021-08-05 NOTE — Progress Notes (Addendum)
eLink Physician-Brief Progress Note ?Patient Name: Edward Hopkins ?DOB: 1950-11-17 ?MRN: [id number] ? ? ?Date of Service ? 08/05/2021  ?HPI/Events of Note ? PT complaining of RLQ pain.  ?As per bedside RN, pt has been complaining of abdominal pain throughout the shift, but it appears to be progressing.  ?BP 108/75, HR 90, which is at baseline.  ?Pt with tenderness to light palpation over RLQ, skin overlying the area reported to be more taut.   ?eICU Interventions ? Will give due pain meds.  ?Follow up AM lab results.  ?Will send for lactate, check KUB to further evaluate.  ?Will consider CT abdomen pelvis pending findings.   ? ?Labs resulted.  ?Lactate 0.7, WBC 7.5, hgb stable at 9.7 ?KUB shows gas filled bowel loops, likely secondary to ileus, largely similar to prior.  ?Will continue to monitor.  ? ?  ? ?[place] ?08/05/2021, 4:33 AM ?

## 2021-08-05 NOTE — Progress Notes (Signed)
?[facility] ?Progress Note  ? ? ?Assessment/ Plan:   ?AKI on CKD 3a: BL Crt 1.3. initial AKI likely 2/2 contrast associate kidney injury and large swings in BP w/ low Bps at time. No obstruction on u/s. Now with ruptured dissection  s/p emergent repair, no filling of right renal artery but there is filling of left renal artery at completion with dissection extending into visceral segment.  Creatinine continues to be slightly improved at 2.8 ?-Continues to make a fair amount of urine today and creatinine even slightly improved.  I think it is very unlikely that his creatinine will return to his previous baseline but hopefully will improve further. ?-He does likely have excess volume.  Will obtain chest x-ray and consider IV Lasix later today ?-Could consider IV albumin once volume status is optimized given serum level is less than 2 ?-Continue to monitor daily Cr, Dose meds for GFR ?-Monitor Daily I/Os, Daily weight  ?-Maintain MAP>65 for optimal renal perfusion.  ?-Avoid nephrotoxic medications including NSAIDs ?-Use synthetic opioids (Fentanyl/Dilaudid) if needed ?  ?Thoracic dissection with aortic rupture ?-s/p emergent repair 3/20 ?-no filling of right renal artery as above ?-VVS following ? ?AHRF ?-Doing much better now extubated ?-Has wheezing on exam ?-Consider bronchodilators ?-Obtain chest x-ray and consider IV Lasix if pulmonary edema is present ? ?Hypotension/shock ?-pressor support per CCM; no longer requiring these medications ? ?Metabolic acidosis ?-Serum bicarbonate improving.  Continue oral bicarbonate ?  ?Cocaine use disorder ?  ?Hep C: s/p treatment ?  ?LFT elevation: likely related to hypotension as well. Improving ?  ?Thrombocytopenia: persistent, per primary service ? ?Subjective:   ?Patient now extubated doing much better.  No longer requiring pressors.  Persistent oxygen requirement.  Denies significant shortness of breath or chest pain.  Urine output has been excellent over  the past 24 hours with about 1.8 L made.  ? ?Objective:   ?BP 102/70   Pulse 87   Temp 98.3 ?F (36.8 ?C) (Oral)   Resp (!) 23   Ht 5\' 9"  (1.753 m)   Wt 97.9 kg   SpO2 98%   BMI 31.87 kg/m?  ? ?Intake/Output Summary (Last 24 hours) at 08/05/2021 0636 ?Last data filed at 08/05/2021 0600 ?Gross per 24 hour  ?Intake 659.73 ml  ?Output 1975 ml  ?Net -1315.27 ml  ? ?Weight change: -2.6 kg ? ?Physical Exam: ?Gen: Lying in bed, no apparent distress ?CVS: Normal rate, no rub ?Resp: Bilateral expiratory wheezing, faint crackles at the bases ?DXA:JOIN, distended ?Ext: edema b/l LE's, warm and well perfused ?Neuro: awake, conversant, jovial ? ?Imaging: ?DG Abd Portable 1V ? ?Result Date: 08/05/2021 ?CLINICAL DATA:  Denies any nausea or vomiting. EXAM: PORTABLE ABDOMEN - 1 VIEW COMPARISON:  Two days ago FINDINGS: Diffuse gaseous distension of small bowel. Large bowel loops are also affected. The enteric tube has been removed. Aortic stenting an IVC filter. IMPRESSION: Diffuse gaseous distension of bowel favoring ileus. An enteric tube has been removed without convincing worsening. Electronically Signed   By: Jorje Guild M.D.   On: 08/05/2021 06:11  ? ?DG Abd Portable 1V ? ?Result Date: 08/04/2021 ?CLINICAL DATA:  Follow-up ileus versus small-bowel obstruction. EXAM: PORTABLE ABDOMEN - 1 VIEW COMPARISON:  Study of 08/02/2021. FINDINGS: Flat plate AP exam was obtained in 2 films at 5:20 a.m., 08/04/2021. Abnormal small-bowel pattern pattern continues to be seen. Small bowel loops in the low central abdomen dilated up to 4.8 cm, previously 4.6 cm. The degree of distention is not  significantly changed but the number of visible dilated bowel loops is less than previously. Gas and stool are noted in the colon into the rectum. There is no dilatation of the colon. There is gas distention of the transverse segment which is similar to the last study. IVC filter and aortic endografts are redemonstrated as well as contrast in the  bladder. There is no supine evidence of free air.  Stable visceral shadows. Markedly severe bilateral hip DJD is again shown. IMPRESSION: The degree of small-bowel dilatation is not significantly changed but the number of visible dilated small bowel loops is less today, which could suggest fluid filling of the previously dilated loops or interval decompression. In all other respects no further changes. Electronically Signed   By: Telford Nab M.D.   On: 08/04/2021 06:36   ? ?Labs: ?BMET ?Recent Labs  ?Lab 08/01/21 ?1532 08/01/21 ?1536 08/01/21 ?1949 08/01/21 ?2001 08/02/21 ?7829 08/02/21 ?0701 08/02/21 ?2114 08/03/21 ?5621 08/04/21 ?3086 08/05/21 ?5784  ?NA 134*   < > 133* 136 138 138 137 138 139 141  ?K 4.8   < > 4.3 4.4 4.5 4.5 4.2 4.2 4.1 3.8  ?CL 106  --  102  --  108  --  109 109 113* 113*  ?CO2 19*  --  19*  --  18*  --  17* 17* 16* 19*  ?GLUCOSE 191*  --  223*  --  122*  --  73 79 100* 91  ?BUN 49*  --  54*  --  53*  --  55* 54* 52* 48*  ?CREATININE 2.62*  --  3.05*  --  3.02*  --  3.25* 3.24* 2.96* 2.83*  ?CALCIUM 8.6*  --  8.4*  --  7.9*  --  7.7* 7.7* 7.7* 7.9*  ?PHOS  --   --   --   --   --   --  4.2 4.4 4.0  --   ? < > = values in this interval not displayed.  ? ?CBC ?Recent Labs  ?Lab 08/01/21 ?1949 08/01/21 ?2001 08/02/21 ?2114 08/03/21 ?6962 08/04/21 ?9528 08/05/21 ?4132  ?WBC 11.0*   < > 8.7 7.3 6.5 7.5  ?NEUTROABS 6.9  --  5.8  --   --   --   ?HGB 13.3   < > 11.0* 10.5* 9.3* 9.7*  ?HCT 38.8*   < > 32.2* 31.3* 28.2* 30.3*  ?MCV 85.1   < > 85.4 86.2 87.0 88.6  ?PLT 92*   < > 82* 77* 81* 97*  ? < > = values in this interval not displayed.  ? ? ?Medications:   ? ? sodium chloride   Intravenous Once  ? bisacodyl  10 mg Rectal Daily  ? Chlorhexidine Gluconate Cloth  6 each Topical Daily  ? docusate  100 mg Per Tube BID  ? folic acid  1 mg Oral Daily  ? heparin  5,000 Units Subcutaneous Q8H  ? insulin aspart  0-15 Units Subcutaneous TID AC & HS  ? mouth rinse  15 mL Mouth Rinse BID  ? multivitamin with  minerals  1 tablet Oral Daily  ? pantoprazole (PROTONIX) IV  40 mg Intravenous Daily  ? polyethylene glycol  17 g Per Tube Daily  ? sodium bicarbonate  1,300 mg Per Tube TID  ? sodium chloride flush  10-40 mL Intracatheter Q12H  ? thiamine  100 mg Oral Daily  ? Or  ? thiamine  100 mg Intravenous Daily  ? ? ? ? ?Shaune Pollack  Carrisa Keller ? ?[facility] ?08/05/2021, 6:36 AM  ? ?

## 2021-08-05 NOTE — Progress Notes (Addendum)
?Progress Note ? ? ? ?08/05/2021 ?6:55 AM ?4 Days Post-Op ? ?Subjective:  extubated and no complaints ? ?afebrile ? ?Vitals:  ? 08/05/21 0500 08/05/21 0600  ?BP: 99/61 102/70  ?Pulse: 82 87  ?Resp: 20 (!) 23  ?Temp:    ?SpO2: 97% 98%  ? ? ?Physical Exam: ?Cardiac:  regular ?Lungs:  extubated; non labored ?Incisions:  right groin looks fine ?Extremities:  bounding DP pulses bilaterally.  Moving all extremities ?Abdomen:  mild abdominal pain with palpation right side. Denies nausea/vomiting. ? ?CBC ?   ?Component Value Date/Time  ? WBC 7.5 08/05/2021 0326  ? RBC 3.42 (L) 08/05/2021 0326  ? HGB 9.7 (L) 08/05/2021 0326  ? HGB 13.8 10/21/2015 1515  ? HCT 30.3 (L) 08/05/2021 0326  ? HCT 39.7 10/21/2015 1515  ? PLT 97 (L) 08/05/2021 0326  ? PLT 139 (L) 10/21/2015 1515  ? MCV 88.6 08/05/2021 0326  ? MCV 88.0 10/21/2015 1515  ? MCH 28.4 08/05/2021 0326  ? MCHC 32.0 08/05/2021 0326  ? RDW 15.9 (H) 08/05/2021 0326  ? RDW 13.3 10/21/2015 1515  ? LYMPHSABS 1.7 08/02/2021 2114  ? LYMPHSABS 2.4 10/21/2015 1515  ? MONOABS 0.5 08/02/2021 2114  ? MONOABS 0.4 10/21/2015 1515  ? EOSABS 0.2 08/02/2021 2114  ? EOSABS 0.4 10/21/2015 1515  ? BASOSABS 0.1 08/02/2021 2114  ? BASOSABS 0.0 10/21/2015 1515  ? ? ?BMET ?   ?Component Value Date/Time  ? NA 141 08/05/2021 0326  ? NA 141 10/21/2015 1516  ? K 3.8 08/05/2021 0326  ? K 4.1 10/21/2015 1516  ? CL 113 (H) 08/05/2021 0326  ? CO2 19 (L) 08/05/2021 0326  ? CO2 21 (L) 10/21/2015 1516  ? GLUCOSE 91 08/05/2021 0326  ? GLUCOSE 114 10/21/2015 1516  ? BUN 48 (H) 08/05/2021 0326  ? BUN 21.6 10/21/2015 1516  ? CREATININE 2.83 (H) 08/05/2021 0326  ? CREATININE 1.4 (H) 10/21/2015 1516  ? CALCIUM 7.9 (L) 08/05/2021 0326  ? CALCIUM 9.3 10/21/2015 1516  ? GFRNONAA 23 (L) 08/05/2021 0326  ? GFRAA >90 08/30/2014 0243  ? ? ?INR ?   ?Component Value Date/Time  ? INR 1.7 (H) 08/01/2021 1532  ? ? ? ?Intake/Output Summary (Last 24 hours) at 08/05/2021 0655 ?Last data filed at 08/05/2021 0600 ?Gross per 24 hour   ?Intake 659.73 ml  ?Output 1975 ml  ?Net -1315.27 ml  ? ? ? ?Assessment/Plan:  71 y.o. male is s/p:  ?emergent repair of a ruptured descending thoracic dissection including extension of the dissection into the visceral segment with hemorrhagic shock  ?4 Days Post-Op ? ? ?-pt extubated yesterday; he continues to have bounding DP pulses bilaterally and moving all extremities.   ?-AKI continues to improve with creatinine of 2.83 today dow from 2.96 ?-acute blood loss anemia-hgb stable and slightly improved today ? ? ? ?Leontine Locket, PA-C ?[facility] ?[phone] ?08/05/2021 ?6:55 AM ? ?I have seen and evaluated the patient. I agree with the PA note as documented above. Postop day 4 status post emergent repair of a ruptured descending thoracic dissection including extension of the dissection into the visceral segment with hemorrhagic shock.  Extubated and pressors weaned off in the ICU.  Moving his lower extremities and motor intact with no evidence of paralysis given extent of thoracic and abdominal aorta that required coverage.  Hemoglobin 9.3 --> 9.7 and stable.  Right groin incision is clean dry and intact.  Palpable DP pulses bilaterally.  Urine output 1975 over past 24 hours  and   Cr improved from 3.24 --> 2.96 --> 2.83.  Would hold on repeat CTA given AKI on CKD.  Abdomen distended but no rebound or guarding.  KUB reviewed and dilated small bowel consistent with ileus there is gas in colon.  Low threshold for NG tube to ILWS especially if n/v.  WBC 7.5 and within normal limits.  Vascular will continue to follow. ? ?Marty Heck, MD ?[facility] ?Office: [phone] ? ? ?

## 2021-08-05 NOTE — Evaluation (Addendum)
Physical Therapy Evaluation Patient Details Name: Edward Hopkins MRN: [id number] DOB: [birth year] Today's Date: 08/05/2021  History of Present Illness  Pt is a 71 y.o. male who presented 07/27/21 with chest and back pain. Pt found to have hypertensive emergency with recurrent acute aortic type B dissection. S/p emergent repair of a ruptured descending thoracic dissection including extension of the dissection into the visceral segment with hemorrhagic shock 3/20. Extubated 3/23. PMH: HTN, Hep C, polysubstance abuse including ongoing cocaine use, Aortic dissection s/p thoracic aortic graft repair, thoracic aortic aneurysm, CHF, DVT, osteomyelitis, endocarditis   Clinical Impression  Pt presents with condition above and deficits mentioned below, see PT Problem List. PTA, he was mod I holding onto furniture for household mobility and then holding onto his wife or a cart for community distance mobility. Pt with x1 fall in week PTA when he had CP. Pt lives with his wife in a 1-level house with 1 STE. He also has an aide come to the house 7 days/week for a few hours to assist in cleaning, transportation to MD appointment, and shopping. Pt intermittently requires assistance to donn socks. Currently, pt displaying R facial droop, slurred speech, R tongue protrusion, R lateral and posterior lean, R leg weakness, and R leg proprioception and sensation deficits. Confirmed via phone call to wife that this is not his baseline and notified RN and MD of all of this. RN reporting pt has been displaying a R facial droop and slurred speech for a few days at least now. Pt also with deficits in balance and activity tolerance. Pt required maxA for bed mobility and to transfer to stand and pivot to the R. Due to his R sided deficits, pt was unable to advance his R leg to take a step today. Will continue to follow acutely. Secondary to his significant functional decline, good support at home, and willingness to participate and  improve, recommending intensive therapy in the AIR setting to maximize his independence and safety with all functional mobility. Will continue to follow acutely.     Recommendations for follow up therapy are one component of a multi-disciplinary discharge planning process, led by the attending physician.  Recommendations may be updated based on patient status, additional functional criteria and insurance authorization.  Follow Up Recommendations Acute inpatient rehab (3hours/day)    Assistance Recommended at Discharge Frequent or constant Supervision/Assistance  Patient can return home with the following  A lot of help with walking and/or transfers;Two people to help with walking and/or transfers;A lot of help with bathing/dressing/bathroom;Two people to help with bathing/dressing/bathroom;Assistance with cooking/housework;Direct supervision/assist for medications management;Direct supervision/assist for financial management;Assist for transportation;Help with stairs or ramp for entrance    Equipment Recommendations Rolling walker (2 wheels);BSC/3in1;Wheelchair (measurements PT);Wheelchair cushion (measurements PT);Hospital bed;Other (comment) (hoyer lift; pending progress)  Recommendations for Other Services  Rehab consult    Functional Status Assessment Patient has had a recent decline in their functional status and demonstrates the ability to make significant improvements in function in a reasonable and predictable amount of time.     Precautions / Restrictions Precautions Precautions: Fall;Other (comment) Precaution Comments: watch SpO2 Restrictions Weight Bearing Restrictions: No      Mobility  Bed Mobility Overal bed mobility: Needs Assistance Bed Mobility: Supine to Sit     Supine to sit: Max assist, HOB elevated     General bed mobility comments: Cues to bring legs off R EOB but pt repeatedly self-distracted. Required maxA to ascend trunk and bring legs off bed with  HOb  elevated. Once sitting, pt repeatedly scooting anteriorly on EOB, neeidng PT to block to prevent pt scooting off EOB.    Transfers Overall transfer level: Needs assistance Equipment used: 1 person hand held assist Transfers: Sit to/from Stand, Bed to chair/wheelchair/BSC Sit to Stand: Max assist Stand pivot transfers: Max assist         General transfer comment: Cues for pt to hold onto therapist's waist to facilitate anterior weight shift, maxA to come to stand from EOB with PT adjusting R foot placement prior to standing as it was far posterior and ankle almost rolled. MaxA to pivot to R from bed > recliner with pt holding onto therapist anterior to him. Pt trying to move feet slightly, but R leg not advancing well.    Ambulation/Gait               General Gait Details: unable to advance R leg efficiently with transfer to R, deferred  Stairs            Wheelchair Mobility    Modified Rankin (Stroke Patients Only) Modified Rankin (Stroke Patients Only) Pre-Morbid Rankin Score: Slight disability Modified Rankin: Severe disability     Balance Overall balance assessment: Needs assistance Sitting-balance support: Bilateral upper extremity supported, Feet supported Sitting balance-Leahy Scale: Poor Sitting balance - Comments: Pt with posterior lean needing min-modA to sit EOB. Postural control: Posterior lean Standing balance support: Bilateral upper extremity supported, During functional activity Standing balance-Leahy Scale: Zero Standing balance comment: MaxA and bil UE support to stand                             Pertinent Vitals/Pain Pain Assessment Pain Assessment: Faces Faces Pain Scale: Hurts even more Pain Location: assumed abdomen, pt would not specify Pain Descriptors / Indicators: Grimacing, Operative site guarding Pain Intervention(s): Limited activity within patient's tolerance, Monitored during session, Repositioned    Home Living  Family/patient expects to be discharged to:: Private residence Living Arrangements: Spouse/significant other Available Help at Discharge: Family;Available 24 hours/day;Personal care attendant Type of Home: House Home Access: Stairs to enter Entrance Stairs-Rails: Right (ascending) Entrance Stairs-Number of Steps: 1   Home Layout: One level Home Equipment: Cane - single point      Prior Function Prior Level of Function : Needs assist;History of Falls (last six months)       Physical Assist : Mobility (physical);ADLs (physical) Mobility (physical): Gait   Mobility Comments: Holds onto wife with community distance mobility; holds onto wall/furniture for household distance mobility; x1 fall in the past 6 months, which occured the week PTA when pt was having CP ADLs Comments: Aide comes 7 days/week for 3-4 hrs each time, and she assists in cleaning,, transportation to MD appointment, and shopping. Pt does not drive. Wife puts socks on for him intermittently, but sometimes he does it. Pt can clean and cook.     Hand Dominance   Dominant Hand: Right    Extremity/Trunk Assessment   Upper Extremity Assessment Upper Extremity Assessment: Defer to OT evaluation (MMT scores of 4+ to 5 grossly bil, fairly symmetrical)    Lower Extremity Assessment Lower Extremity Assessment: RLE deficits/detail;Generalized weakness;LLE deficits/detail RLE Deficits / Details: MMT scores of 3- hip flexion, 4+ knee extension, 4+ ankle dorsiflexion; functional weakness noted; appeared to have sensation deficits, not being able to tell PT where he was being touched on the leg repeatedely; appeared to have proprioception deficits with poor foot placement  with transfers RLE Sensation: decreased proprioception;decreased light touch RLE Coordination: decreased gross motor;decreased fine motor LLE Deficits / Details: MMT scores of 4 hip flexion, 5 knee extension, 5 ankle dorsiflexion; functional weakness noted; was  able to detect touch fairly quickly on L    Cervical / Trunk Assessment Cervical / Trunk Assessment: Other exceptions Cervical / Trunk Exceptions: abdominal surgery, extended abdomen  Communication   Communication: Expressive difficulties (slurring)  Cognition Arousal/Alertness: Awake/alert Behavior During Therapy: Anxious, Impulsive Overall Cognitive Status: Impaired/Different from baseline Area of Impairment: Memory, Attention, Following commands, Safety/judgement, Awareness, Problem solving, Orientation                 Orientation Level: Disoriented to, Time Current Attention Level: Sustained Memory: Decreased short-term memory Following Commands: Follows one step commands inconsistently, Follows one step commands with increased time Safety/Judgement: Decreased awareness of safety, Decreased awareness of deficits Awareness: Intellectual Problem Solving: Slow processing, Difficulty sequencing, Requires verbal cues, Decreased initiation, Requires tactile cues General Comments: Pt stating month was "May". Able to detect current year and location and did suggest an abdominal surgery, but was not very specific. Pt perseverating on words and with tangential speech at times. Needs extra time and repeated simple multi-modal step-by-step cues for all tasks. Needs redirecting often. Impulsively scoot anteriorly, needing blocking to ensure safety. At baseline, pt is cognitively intact per call with pt's SO.        General Comments General comments (skin integrity, edema, etc.): SpO2 with poor waveforms, but decreased to 80s% at times on supplemental O2 via [place]; upon entry, pt leaning to R with R facial droop and slurred speech, notified RN who reported prior RNs of past few days report this has been his normal. Notified RN and MD of R sided deficits, including R tongue protrusion, and that his SO reports this is not his normal.    Exercises     Assessment/Plan    PT Assessment Patient  needs continued PT services  PT Problem List Decreased strength;Decreased activity tolerance;Decreased balance;Decreased mobility;Decreased coordination;Decreased cognition;Decreased safety awareness;Decreased knowledge of use of DME;Cardiopulmonary status limiting activity;Impaired sensation       PT Treatment Interventions DME instruction;Gait training;Stair training;Functional mobility training;Therapeutic activities;Therapeutic exercise;Balance training;Neuromuscular re-education;Cognitive remediation;Patient/family education;Wheelchair mobility training    PT Goals (Current goals can be found in the Care Plan section)  Acute Rehab PT Goals Patient Stated Goal: to ask PT some questions PT Goal Formulation: With patient/family Time For Goal Achievement: 08/19/21 Potential to Achieve Goals: Good    Frequency Min 4X/week     Co-evaluation               AM-PAC PT "6 Clicks" Mobility  Outcome Measure Help needed turning from your back to your side while in a flat bed without using bedrails?: A Lot Help needed moving from lying on your back to sitting on the side of a flat bed without using bedrails?: A Lot Help needed moving to and from a bed to a chair (including a wheelchair)?: A Lot Help needed standing up from a chair using your arms (e.g., wheelchair or bedside chair)?: A Lot Help needed to walk in hospital room?: Total Help needed climbing 3-5 steps with a railing? : Total 6 Click Score: 10    End of Session Equipment Utilized During Treatment: Gait belt;Oxygen Activity Tolerance: Patient tolerated treatment well Patient left: in chair;with call bell/phone within reach;with chair alarm set Nurse Communication: Mobility status;Need for lift equipment;Other (comment) (R sided deficits; chair alarm cord does  not have outlet to plug into wall; pt scoots anteriorly) PT Visit Diagnosis: Unsteadiness on feet (R26.81);Muscle weakness (generalized) (M62.81);History of falling  (Z91.81);Difficulty in walking, not elsewhere classified (R26.2);Other abnormalities of gait and mobility (R26.89)    Time: 1610-9604 PT Time Calculation (min) (ACUTE ONLY): 60 min   Charges:   PT Evaluation $PT Eval Moderate Complexity: 1 Mod PT Treatments $Therapeutic Activity: 38-52 mins        Raymond Gurney, PT, DPT Acute Rehabilitation Services  Pager: [phone] Office: [phone]   Jewel Baize 08/05/2021, 2:23 PM

## 2021-08-05 NOTE — Progress Notes (Signed)
? ?NAME:  Edward Hopkins, MRN:  [id number], DOB:  05/01/1951, LOS: 9 ?ADMISSION DATE:  07/27/2021, CONSULTATION DATE:  07/27/21 ?REFERRING MD:  Francia Greaves - EM, CHIEF COMPLAINT:  Aortic Dissection   ? ?History of Present Illness:  ?71 yo M PMH HTN, Hep C, polysubstance abuse including ongoing cocaine use, Aortic dissection s/p thoracic aortic graft repair, thoracic aortic aneurysm presented to [facility] 3/15 with CC chest pain and CTA revealed recurrent type B dissection with flap beginning distal to takeoff of L subclavian artery and extending to takeoff of SMA.  ? ?Case was discussed with CVTS who recommended medical management so he was admitted to ICU for aggressive blood pressure management. He was transferred to the floor 3/17.  ? ?PCCM was called back on the morning of 3/20 due to acute onset hypotension and associated chest pain, after emergent CTA he was found to have extension of the dissection into the visceral segment with rupture in the chest.  He was taken emergently to the OR by vascular surgery ? ?Pertinent  Medical History  ?HTN ?Hep C  ?Aortic dissection  ?Aortic aneurysm  ?DVT ? ?Significant Hospital Events: ?Including procedures, antibiotic start and stop dates in addition to other pertinent events   ?3/15 Ed for chest and back pain. Found to have recurrent type B dissection. Admit to PCCM  ?3/17 transfer out of ICU ?3/20 aneurysm ruptured; taken emergently for surgery; transferred back to ICU ?3/21 On three pressors, following commands ?3/24 Decidedly improved, extubated yesterday, off pressors, making urine ? ?Interim History / Subjective:  ? ?Extubated to nasal cannula ?C/o LUQ pain, passing some gas but reports no substantial BM yet ? ?Objective   ?Blood pressure 102/70, pulse (!) 105, temperature 98.1 ?F (36.7 ?C), temperature source Oral, resp. rate (!) 22, height 5\' 9"  (1.753 m), weight 97.9 kg, SpO2 96 %. ?CVP:  [12 mmHg-25 mmHg] 19 mmHg  ?   ? ?Intake/Output Summary (Last 24 hours) at 08/05/2021  0834 ?Last data filed at 08/05/2021 0600 ?Gross per 24 hour  ?Intake 536.43 ml  ?Output 1875 ml  ?Net -1338.57 ml  ? ? ?Filed Weights  ? 08/03/21 0500 08/04/21 0427 08/05/21 0500  ?Weight: 96.9 kg 100.5 kg 97.9 kg  ? ?General:  well-nourished M, ill-appearing, resting in bed in no acute distress ?HEENT: MM pink/moist, pupils equal, [place] in place ?Neuro: arousable and oriented to person and place, appropriately conversational and moving all extremities ?CV: s1s2 rrr, no m/r/g ?PULM:  decreased air entry bilateral bases, no distress or significant rhonchi or wheezing ?GI: soft, minimal BS, distended ?Extremities: warm/dry, no edema  ?Skin: no rashes or lesions ? ? ?Resolved Hospital Problem list   ? ? ?Assessment & Plan:  ? ?Ruptured aortic aneurysm ?3/15 CTA: L subclavian artery to level of SMA takeoff. Fenestration of dissection flap mid descending thoracic aorta ?3/20 repeat CTA with aortic rupture with mediastinal and retroperitoneal hematoma. Extension of dissection flap to IMA and right renal artery ?Hx of aortic dissection status post ascending aortic replacement including aorto innominate and aorto left carotid bypass by Dr. Cameron Ali)  ?3/20 Taken to OR for emergent repair ?Plan: ?-off pressors, MAPs still borderline low, continue to monitor in ICU, d/c empiric Cefepime today ?-Postop surgical management per vascular surgery. ? ?Acute Hypoxic Respiratory Failure: initially post op failure. Now complicated by volume overload in the setting of renal failure. Pulmonary edema and bilateral effusions.  ?Plan: ?-improved, extubated to [place] 3/23, CXR with improved aeration ?-continue to closely monitor in ICU  as BP still borderline ?-bipap if needed ? ?Acute Kidney Injury ?Improving, creatinine slowly down-trending and made 1.9L Urine yesterday ?-appreciate nephrology recs, continue to follow renal indices and UOP, showing signs of  renal recovery ? ? ?Ileus  ?KUB still with likely ileus ?-continue holding TF ?-NGT to  suction ?-continue Colace and Dulcolax suppository and Miralax, pt reports passing gas, so hoping will start moving bowels with more time ? ? ? ?Cocaine use disorder ?Bilateral inguinal hernias (L>R) ?Outpatient follow-up ? ? ?Best Practice (right click and "Reselect all SmartList Selections" daily)  ? ?Diet/type: NPO w/ oral meds ?DVT prophylaxis: SCD ?GI prophylaxis: PPI ?Lines: Arterial Line ?Foley:  Yes, and it is still needed ?Code Status:  full code ?Last date of multidisciplinary goals of care discussion [improving, full code, SO update pending for 3/24] ? ?Labs   ?CBC: ?Recent Labs  ?Lab 08/01/21 ?1949 08/01/21 ?2001 08/02/21 ?3382 08/02/21 ?0701 08/02/21 ?2114 08/03/21 ?5053 08/04/21 ?9767 08/05/21 ?3419  ?WBC 11.0*  --  10.1  --  8.7 7.3 6.5 7.5  ?NEUTROABS 6.9  --   --   --  5.8  --   --   --   ?HGB 13.3   < > 12.6* 10.9* 11.0* 10.5* 9.3* 9.7*  ?HCT 38.8*   < > 36.7* 32.0* 32.2* 31.3* 28.2* 30.3*  ?MCV 85.1  --  85.0  --  85.4 86.2 87.0 88.6  ?PLT 92*  --  81*  --  82* 77* 81* 97*  ? < > = values in this interval not displayed.  ? ? ? ?Basic Metabolic Panel: ?Recent Labs  ?Lab 08/01/21 ?1949 08/01/21 ?2001 08/02/21 ?3790 08/02/21 ?2409 08/02/21 ?7353 08/02/21 ?2114 08/03/21 ?2992 08/04/21 ?4268 08/05/21 ?3419  ?NA 133*   < > 138 138  --  137 138 139 141  ?K 4.3   < > 4.5 4.5  --  4.2 4.2 4.1 3.8  ?CL 102  --  108  --   --  109 109 113* 113*  ?CO2 19*  --  18*  --   --  17* 17* 16* 19*  ?GLUCOSE 223*  --  122*  --   --  73 79 100* 91  ?BUN 54*  --  53*  --   --  55* 54* 52* 48*  ?CREATININE 3.05*  --  3.02*  --   --  3.25* 3.24* 2.96* 2.83*  ?CALCIUM 8.4*  --  7.9*  --   --  7.7* 7.7* 7.7* 7.9*  ?MG 2.2  --   --   --  2.1 2.1 2.2 2.3  --   ?PHOS  --   --   --   --   --  4.2 4.4 4.0  --   ? < > = values in this interval not displayed.  ? ? ?GFR: ?Estimated Creatinine Clearance: 26.8 mL/min (A) (by C-G formula based on SCr of 2.83 mg/dL (H)). ?Recent Labs  ?Lab 08/01/21 ?1742 08/01/21 ?1949 08/02/21 ?6222  08/02/21 ?9798 08/02/21 ?2114 08/03/21 ?9211 08/04/21 ?9417 08/05/21 ?4081 08/05/21 ?0440  ?WBC  --    < > 10.1  --  8.7 7.3 6.5 7.5  --   ?LATICACIDVEN 1.8  --  1.5 1.4  --   --   --   --  0.7  ? < > = values in this interval not displayed.  ? ? ? ?Liver Function Tests: ?Recent Labs  ?Lab 07/31/21 ?0926 08/01/21 ?1949 08/02/21 ?4481 08/04/21 ?8563 08/05/21 ?0326  ?  AST 106* 38 35 29 29  ?ALT 138* 63* 49* 11 11  ?ALKPHOS 88 41 66 06 30  ?BILITOT 0.8 3.0* 2.8* 3.9* 3.7*  ?PROT 6.6 5.2* 5.3* 5.3* 5.6*  ?ALBUMIN 2.3* 1.9* 2.1* 1.7* 1.8*  ? ? ?No results for input(s): LIPASE, AMYLASE in the last 168 hours. ?No results for input(s): AMMONIA in the last 168 hours. ? ?ABG ?   ?Component Value Date/Time  ? PHART 7.409 08/02/2021 0701  ? PCO2ART 31.8 (L) 08/02/2021 0701  ? PO2ART 76 (L) 08/02/2021 0701  ? HCO3 20.1 08/02/2021 0701  ? TCO2 21 (L) 08/02/2021 0701  ? ACIDBASEDEF 4.0 (H) 08/02/2021 0701  ? O2SAT 95 08/02/2021 0701  ? ?  ? ?Coagulation Profile: ?Recent Labs  ?Lab 08/01/21 ?1149 08/01/21 ?1532  ?INR 1.6* 1.7*  ? ? ? ?Cardiac Enzymes: ?No results for input(s): CKTOTAL, CKMB, CKMBINDEX, TROPONINI in the last 168 hours. ? ?HbA1C: ?Hgb A1c MFr Bld  ?Date/Time Value Ref Range Status  ?08/01/2021 07:49 PM 5.5 4.8 - 5.6 % Final  ?  Comment:  ?  (NOTE) ?Pre diabetes:          5.7%-6.4% ? ?Diabetes:              >6.4% ? ?Glycemic control for   <7.0% ?adults with diabetes ?  ?08/24/2014 11:04 AM 5.9 (H) 4.8 - 5.6 % Final  ?  Comment:  ?  (NOTE) ?        Pre-diabetes: 5.7 - 6.4 ?        Diabetes: >6.4 ?        Glycemic control for adults with diabetes: <7.0 ?  ? ? ?CBG: ?Recent Labs  ?Lab 08/04/21 ?1124 08/04/21 ?1709 08/04/21 ?2017 08/04/21 ?2341 08/05/21 ?1601  ?GLUCAP 77 82 88 74 82  ? ? ? ?Review of Systems:   ?Unable to be obtained due to critical illness ? ?Past Medical History:  ?He,  has a past medical history of CHF (congestive heart failure) ([place]), Constipation, DVT (deep venous thrombosis) ([place]) (1995),  Endocarditis, Hypertension, Intramural aortic hematoma ([place]), Osteomyelitis ([place]), Right femoral vein DVT ([place]) (07/2014), and Septic arthritis ([place]).  ? ?Surgical History:  ? ?Past Surgical History:  ?Procedure Ladean Raya

## 2021-08-05 NOTE — Progress Notes (Addendum)
Addendum: ? ?CT H reviewed, no acute abnormality  ? ?_____ ? ?CCM interval note  ? ?Notified of patient reporting right facial droop.  ? ?On further interview with patient, he feels this  started 4 days ago, which looks to coincide roughly with the date/time patient was found to have ruptured aortic dissection.  ?Patient does not feel like there has been a change in post-operative period, and exams by staff post op have not revealed focal deficits / findings concerning for acute neuro changes. Appearance of face has reportedly been stable the past few days.  ? ?On my exam there may be a very slight right facial droop. Some mild dysarthria. BUE BLE 5/5 and symmetrical, no drift. PERRLA. No change in sensation, vision.  ? ?Difficult to interpret present findings -- pt has a slight crossbite which might be confounding appearance of slight facial droop. Has appropriately received opiates for post op pain management.  ?No acute changes today in neuro status.  ? ? ?Plan: ?-will send for CT H, but do not think there would be great utility in calling a  "Code Stroke" as patient has not had a change in neuro status x multiple days (LKN before patient feels facial droop started is 08/01/2021) and would be out of window. D/w CCM MD ?-depending on CT, possible neuro consult  ? ? ?Eliseo Gum MSN, AGACNP-BC ?[facility] ?Amion for pager ?08/05/2021, 4:55 PM ? ?  ?

## 2021-08-05 NOTE — Progress Notes (Signed)
? ?  Inpatient Rehab Admissions Coordinator : ? ?Per therapy recommendations, patient was screened for CIR candidacy by Kassidy Frankson RN MSN.  At this time patient appears to be a potential candidate for CIR. I will place a rehab consult per protocol for full assessment. Please call me with any questions. ? ?Kaya Klausing RN MSN ?Admissions Coordinator ?[phone] ?  ?

## 2021-08-06 DIAGNOSIS — I71019 Dissection of thoracic aorta, unspecified: Secondary | ICD-10-CM | POA: Diagnosis not present

## 2021-08-06 LAB — BASIC METABOLIC PANEL
Anion gap: 9 (ref 5–15)
BUN: 46 mg/dL — ABNORMAL HIGH (ref 8–23)
CO2: 19 mmol/L — ABNORMAL LOW (ref 22–32)
Calcium: 8 mg/dL — ABNORMAL LOW (ref 8.9–10.3)
Chloride: 114 mmol/L — ABNORMAL HIGH (ref 98–111)
Creatinine, Ser: 2.79 mg/dL — ABNORMAL HIGH (ref 0.61–1.24)
GFR, Estimated: 23 mL/min — ABNORMAL LOW (ref >60)
Glucose, Bld: 77 mg/dL (ref 70–99)
Potassium: 4.1 mmol/L (ref 3.5–5.1)
Sodium: 142 mmol/L (ref 135–145)

## 2021-08-06 LAB — CBC
HCT: 29.3 % — ABNORMAL LOW (ref 39.0–52.0)
Hemoglobin: 9.8 g/dL — ABNORMAL LOW (ref 13.0–17.0)
MCH: 28.9 pg (ref 26.0–34.0)
MCHC: 33.4 g/dL (ref 30.0–36.0)
MCV: 86.4 fL (ref 80.0–100.0)
Platelets: 127 10*3/uL — ABNORMAL LOW (ref 150–400)
RBC: 3.39 MIL/uL — ABNORMAL LOW (ref 4.22–5.81)
RDW: 15.9 % — ABNORMAL HIGH (ref 11.5–15.5)
WBC: 7.9 10*3/uL (ref 4.0–10.5)
nRBC: 0 % (ref 0.0–0.2)

## 2021-08-06 LAB — MAGNESIUM: Magnesium: 2.3 mg/dL (ref 1.7–2.4)

## 2021-08-06 LAB — GLUCOSE, CAPILLARY
Glucose-Capillary: 80 mg/dL (ref 70–99)
Glucose-Capillary: 111 mg/dL — ABNORMAL HIGH (ref 70–99)
Glucose-Capillary: 112 mg/dL — ABNORMAL HIGH (ref 70–99)

## 2021-08-06 MED ORDER — FUROSEMIDE 10 MG/ML IJ SOLN
40.0000 mg | Freq: Once | INTRAMUSCULAR | Status: AC
  Administered 2021-08-06: 40 mg via INTRAVENOUS
  Filled 2021-08-06: qty 4

## 2021-08-06 NOTE — Evaluation (Signed)
Occupational Therapy Evaluation ?Patient Details ?Name: Edward Hopkins ?MRN: [id number] ?DOB: 11-Mar-1951 ?Today's Date: 08/06/2021 ? ? ?History of Present Illness Pt is a 71 y.o. male who presented 07/27/21 with chest and back pain. Pt found to have hypertensive emergency with recurrent acute aortic type B dissection. S/p emergent repair of a ruptured descending thoracic dissection including extension of the dissection into the visceral segment with hemorrhagic shock 3/20. Extubated 3/23. PMH: HTN, Hep C, polysubstance abuse including ongoing cocaine use, Aortic dissection s/p thoracic aortic graft repair, thoracic aortic aneurysm, CHF, DVT, osteomyelitis, endocarditis  ? ?Clinical Impression ?  ?Pt PTA: Pt living with spouse, reportedly per chart has caregiver during the week for ADL/iADL needs. Pt currently, limited by decreased cognition, decreased activity tolererance and decreased ability to care for self. Pt currently, on 4L O2 continuous >88% with exertion and HR max 123 BPM.Pt appears to be more lucid since PT eval 08/05/21. Pt alert Ox3 other than situation. Pt would follow commands, but was argumentative at times due to not knowing why he was in the hospital. Pt following commands with mulitple cues. Pt modA +2 for stand pivot transfer from recliner to [facility] and BSC to bed.  Pt minA to [place] for ADL at this time. Pt would benefit from continued therapy. OT following acutely. ?   ? ?Recommendations for follow up therapy are one component of a multi-disciplinary discharge planning process, led by the attending physician.  Recommendations may be updated based on patient status, additional functional criteria and insurance authorization.  ? ?Follow Up Recommendations ? Acute inpatient rehab (3hours/day)  ?  ?Assistance Recommended at Discharge Frequent or constant Supervision/Assistance  ?Patient can return home with the following A lot of help with walking and/or transfers;A lot of help with  bathing/dressing/bathroom;Assistance with cooking/housework;Assistance with feeding ? ?  ?Functional Status Assessment ? Patient has had a recent decline in their functional status and demonstrates the ability to make significant improvements in function in a reasonable and predictable amount of time.  ?Equipment Recommendations ? BSC/3in1;Wheelchair (measurements OT)  ?  ?Recommendations for Other Services Rehab consult ? ? ?  ?Precautions / Restrictions Precautions ?Precautions: Fall;Other (comment) ?Precaution Comments: watch SpO2 ?Restrictions ?Weight Bearing Restrictions: No  ? ?  ? ?Mobility Bed Mobility ?Overal bed mobility: Needs Assistance ?Bed Mobility: Sit to Supine ?  ?  ?  ?Sit to supine: Mod assist, +2 for physical assistance, +2 for safety/equipment ?  ?General bed mobility comments: Assist for leaning on R elbow and moving BLEs onto bed. + 2 assist for safety. ?  ? ?Transfers ?Overall transfer level: Needs assistance ?Equipment used: 1 person hand held assist ?Transfers: Sit to/from Stand, Bed to chair/wheelchair/BSC ?Sit to Stand: Max assist ?Stand pivot transfers: Max assist ?  ?  ?  ?  ?General transfer comment: Cues for pt to hold onto therapist's waist to facilitate anterior weight shift, maxA to come to stand from EOB with PT adjusting R foot placement prior to standing as it was far posterior and ankle almost rolled. MaxA to pivot to R from bed > recliner with pt holding onto therapist anterior to him. Pt trying to move feet slightly, but R leg not advancing well. ?  ? ?  ?Balance Overall balance assessment: Needs assistance ?Sitting-balance support: Bilateral upper extremity supported, Feet supported ?Sitting balance-Leahy Scale: Poor ?Sitting balance - Comments: Pt with posterior lean needing min-modA to sit EOB. ?Postural control: Posterior lean ?Standing balance support: Bilateral upper extremity supported, During functional activity ?Standing  balance-Leahy Scale: Poor ?Standing balance  comment: MinA to modA for support; modA overall to assist with movement ?  ?  ?  ?  ?  ?  ?  ?  ?  ?  ?  ?   ? ?ADL either performed or assessed with clinical judgement  ? ?ADL Overall ADL's : Needs assistance/impaired ?Eating/Feeding: Maximal assistance ?  ?Grooming: Moderate assistance;Sitting ?  ?Upper Body Bathing: Moderate assistance;Sitting ?  ?Lower Body Bathing: Total assistance ?  ?Upper Body Dressing : Moderate assistance ?  ?Lower Body Dressing: Total assistance;Sitting/lateral leans;Sit to/from stand ?  ?Toilet Transfer: Moderate assistance;+2 for physical assistance;+2 for safety/equipment;Rolling walker (2 wheels) ?Toilet Transfer Details (indicate cue type and reason): assist for transfer from recliner to [facility] and BSC to bed ?Toileting- Clothing Manipulation and Hygiene: Total assistance;Sitting/lateral lean;Sit to/from stand;Cueing for safety;Cueing for sequencing ?  ?  ?  ?Functional mobility during ADLs: Moderate assistance;+2 for physical assistance;+2 for safety/equipment;Rolling walker (2 wheels) ?General ADL Comments: Pt limited by decreased cognition, decreased activity tolererance and decreased ability to care for self. Pt currently, on 4L O2 continuous >88% with exertion adn HR max 123 BPM.  ? ? ? ?Vision Baseline Vision/History: 1 Wears glasses ?Ability to See in Adequate Light: 0 Adequate ?Patient Visual Report: No change from baseline ?Vision Assessment?: No apparent visual deficits  ?   ?Perception   ?  ?Praxis   ?  ? ?Pertinent Vitals/Pain Pain Assessment ?Pain Assessment: 0-10 ?Faces Pain Scale: Hurts even more ?Facial Expression: Tense ?Body Movements: Protection ?Muscle Tension: Tense, rigid ?Compliance with ventilator (intubated pts.): N/A ?Vocalization (extubated pts.): N/A ?CPOT Total: 3 ?Pain Location: assumed abdomen, pt would not specify ?Pain Descriptors / Indicators: Grimacing, Operative site guarding ?Pain Intervention(s): Monitored during session, Repositioned, Premedicated  before session  ? ? ? ?Hand Dominance Right ?  ?Extremity/Trunk Assessment Upper Extremity Assessment ?Upper Extremity Assessment: Generalized weakness;LUE deficits/detail;RUE deficits/detail ?RUE Deficits / Details: 3+/5 grossly ?LUE Deficits / Details: 4-/5 strength grossly ?  ?Lower Extremity Assessment ?Lower Extremity Assessment: Defer to PT evaluation;Generalized weakness ?  ?Cervical / Trunk Assessment ?Cervical / Trunk Assessment: Other exceptions ?Cervical / Trunk Exceptions: abdominal surgery, extended abdomen ?  ?Communication Communication ?Communication: Expressive difficulties (slurring) ?  ?Cognition Arousal/Alertness: Awake/alert ?Behavior During Therapy: Anxious, Impulsive ?Overall Cognitive Status: Impaired/Different from baseline ?Area of Impairment: Memory, Attention, Following commands, Safety/judgement, Awareness, Problem solving, Orientation ?  ?  ?  ?  ?  ?  ?  ?  ?Orientation Level: Disoriented to, Time ?Current Attention Level: Sustained ?Memory: Decreased short-term memory ?Following Commands: Follows one step commands inconsistently, Follows one step commands with increased time ?Safety/Judgement: Decreased awareness of safety, Decreased awareness of deficits ?Awareness: Intellectual ?Problem Solving: Slow processing, Difficulty sequencing, Requires verbal cues, Decreased initiation, Requires tactile cues ?General Comments: Pt appears to be more lucid since PT eval 08/05/21. Pt alert Ox3 other than situation. Pt would follow commands, but was argumentative at times due to not knowing why he was at the hospital. Pt following commands with mulitple cues. ?  ?  ?General Comments  Pt seated in recliner and was actively having BM so NT and this OT worked to stand pt and transfer him from recliner to [facility] and back to bed. ? ?  ?Exercises   ?  ?Shoulder Instructions    ? ? ?Home Living Family/patient expects to be discharged to:: Private residence ?Living Arrangements: Spouse/significant  other ?Available Help at Discharge: Family;Available 24 hours/day;Personal care attendant ?Type of Home: House ?  Home Access: Stairs to enter ?Entrance Stairs-Number of Steps: 1 ?Entrance Stairs-Rails: Right (ascending) ?Home Layout: On

## 2021-08-06 NOTE — Progress Notes (Signed)
? ?NAME:  Edward Hopkins, MRN:  [id number], DOB:  26-Oct-1950, LOS: 76 ?ADMISSION DATE:  07/27/2021, CONSULTATION DATE:  07/27/21 ?REFERRING MD:  Francia Greaves - EM, CHIEF COMPLAINT:  Aortic Dissection   ? ?History of Present Illness:  ?71 yo M PMH HTN, Hep C, polysubstance abuse including ongoing cocaine use, Aortic dissection s/p thoracic aortic graft repair, thoracic aortic aneurysm presented to [facility] 3/15 with CC chest pain and CTA revealed recurrent type B dissection with flap beginning distal to takeoff of L subclavian artery and extending to takeoff of SMA.  ? ?Case was discussed with CVTS who recommended medical management so he was admitted to ICU for aggressive blood pressure management. He was transferred to the floor 3/17.  ? ?PCCM was called back on the morning of 3/20 due to acute onset hypotension and associated chest pain, after emergent CTA he was found to have extension of the dissection into the visceral segment with rupture in the chest.  He was taken emergently to the OR by vascular surgery ? ?Pertinent  Medical History  ?HTN ?Hep C  ?Aortic dissection  ?Aortic aneurysm  ?DVT ? ?Significant Hospital Events: ?Including procedures, antibiotic start and stop dates in addition to other pertinent events   ?3/15 Ed for chest and back pain. Found to have recurrent type B dissection. Admit to PCCM  ?3/17 transfer out of ICU ?3/20 aneurysm ruptured; taken emergently for surgery; transferred back to ICU ?3/21 On three pressors, following commands ?3/24 Decidedly improved, extubated yesterday, off pressors, making urine ?Head CT 3/24 >> no acute intracranial abnormalities ? ?Interim History / Subjective:  ? ?Head CT performed 3/24 to evaluate facial droop, some focal right-sided weakness when working with PT.  Scan reassuring ?3 L/min and comfortable respiratory pattern ?Had a bowel movement, improved ileus ?I/O+ 9 L total ?S Cr 2.79 < 2.83 <2.96 ? ?Objective   ?Blood pressure 118/79, pulse 99, temperature 97.6 ?F  (36.4 ?C), temperature source Oral, resp. rate (!) 30, height 5\' 9"  (1.753 m), weight 97.1 kg, SpO2 97 %. ?   ?   ? ?Intake/Output Summary (Last 24 hours) at 08/06/2021 0923 ?Last data filed at 08/06/2021 0801 ?Gross per 24 hour  ?Intake 246 ml  ?Output 450 ml  ?Net -204 ml  ? ?Filed Weights  ? 08/04/21 0427 08/05/21 0500 08/06/21 0325  ?Weight: 100.5 kg 97.9 kg 97.1 kg  ? ?General:  well-nourished M, ill-appearing, resting in bed in no acute distress ?HEENT: MM pink/moist, pupils equal, [place] in place ?Neuro: arousable and oriented to person and place, appropriately conversational and moving all extremities ?CV: s1s2 rrr, no m/r/g ?PULM:  decreased air entry bilateral bases, no distress or significant rhonchi or wheezing ?GI: soft, minimal BS, distended ?Extremities: warm/dry, no edema  ?Skin: no rashes or lesions ? ? ?Resolved Hospital Problem list   ? ? ?Assessment & Plan:  ? ?Ruptured aortic aneurysm ?3/15 CTA: L subclavian artery to level of SMA takeoff. Fenestration of dissection flap mid descending thoracic aorta ?3/20 repeat CTA with aortic rupture with mediastinal and retroperitoneal hematoma. Extension of dissection flap to IMA and right renal artery ?Hx of aortic dissection status post ascending aortic replacement including aorto innominate and aorto left carotid bypass by Dr. Cameron Ali)  ?3/20 Taken to OR for emergent repair ?Plan: ?-Remains off pressors, blood pressure at goal ?-Home amlodipine, metoprolol have not yet been restarted ?-Postop care as per vascular surgery plans ?-Empiric cefepime stopped on 3/24 ? ?Acute Hypoxic Respiratory Failure: initially post op failure.  Then complicated by volume overload in the setting of renal failure. Pulmonary edema and bilateral effusions.  ?Plan: ?-Doing well postextubation.  Wean FiO2 ?-Push pulmonary hygiene ?-Intermittent diuresis based on renal function, blood pressure and respiratory status.  Lasix 40 mg x 1 ordered on 3/25 by nephrology ? ?Acute Kidney  Injury ?Improving, creatinine slowly down-trending ?-Appreciate nephrology assistance ?-Following BMP and urine output ?-Dosing diuretics intermittently, Lasix given 3/25 ? ?Ileus  ?Improved, bowel movement on 3/25 ?-Okay for heart healthy/carb modified diet ?-Bowel regimen as ordered ? ?Right facial droop and some right lower extremity weakness, subacute.  No known history of CVA ?-Head CT 3/24 was reassuring ?-Could consider MRI brain going forward ?-Continue PT/OT ? ?Cocaine use disorder ?Bilateral inguinal hernias (L>R) ?-Needs cessation, support as outpatient after discharge ? ? ?Best Practice (right click and "Reselect all SmartList Selections" daily)  ? ?Diet/type: Regular consistency (see orders) ?DVT prophylaxis: prophylactic heparin  ?GI prophylaxis: PPI ?Lines: N/A ?Foley:  Yes, and it is no longer needed ?Code Status:  full code ?Last date of multidisciplinary goals of care discussion [improving, full code] ? ?Dispo: Patient can transition to medical surgical bed 3/25, will ask TRH to assume his care as of 3/26 ? ? ?Labs   ?CBC: ?Recent Labs  ?Lab 08/01/21 ?1949 08/01/21 ?2001 08/02/21 ?2114 08/03/21 ?9509 08/04/21 ?3267 08/05/21 ?1245 08/06/21 ?8099  ?WBC 11.0*   < > 8.7 7.3 6.5 7.5 7.9  ?NEUTROABS 6.9  --  5.8  --   --   --   --   ?HGB 13.3   < > 11.0* 10.5* 9.3* 9.7* 9.8*  ?HCT 38.8*   < > 32.2* 31.3* 28.2* 30.3* 29.3*  ?MCV 85.1   < > 85.4 86.2 87.0 88.6 86.4  ?PLT 92*   < > 82* 77* 81* 97* 127*  ? < > = values in this interval not displayed.  ? ? ?Basic Metabolic Panel: ?Recent Labs  ?Lab 08/02/21 ?8338 08/02/21 ?2114 08/03/21 ?2505 08/04/21 ?0248 08/05/21 ?0326 08/06/21 ?3976  ?NA  --  137 138 139 141 142  ?K  --  4.2 4.2 4.1 3.8 4.1  ?CL  --  109 109 113* 113* 114*  ?CO2  --  17* 17* 16* 19* 19*  ?GLUCOSE  --  73 79 100* 91 77  ?BUN  --  55* 54* 52* 48* 46*  ?CREATININE  --  3.25* 3.24* 2.96* 2.83* 2.79*  ?CALCIUM  --  7.7* 7.7* 7.7* 7.9* 8.0*  ?MG 2.1 2.1 2.2 2.3  --  2.3  ?PHOS  --  4.2 4.4 4.0   --   --   ? ?GFR: ?Estimated Creatinine Clearance: 27.1 mL/min (A) (by C-G formula based on SCr of 2.79 mg/dL (H)). ?Recent Labs  ?Lab 08/01/21 ?1742 08/01/21 ?1949 08/02/21 ?7341 08/02/21 ?9379 08/02/21 ?2114 08/03/21 ?0240 08/04/21 ?9735 08/05/21 ?3299 08/05/21 ?2426 08/06/21 ?8341  ?WBC  --    < > 10.1  --    < > 7.3 6.5 7.5  --  7.9  ?LATICACIDVEN 1.8  --  1.5 1.4  --   --   --   --  0.7  --   ? < > = values in this interval not displayed.  ? ? ?Liver Function Tests: ?Recent Labs  ?Lab 07/31/21 ?0926 08/01/21 ?1949 08/02/21 ?9622 08/04/21 ?2979 08/05/21 ?0326  ?AST 106* 38 35 29 29  ?ALT 138* 63* 49* 11 11  ?ALKPHOS 89 21 19 41 74  ?BILITOT 0.8 3.0* 2.8* 3.9* 3.7*  ?  PROT 6.6 5.2* 5.3* 5.3* 5.6*  ?ALBUMIN 2.3* 1.9* 2.1* 1.7* 1.8*  ? ?No results for input(s): LIPASE, AMYLASE in the last 168 hours. ?No results for input(s): AMMONIA in the last 168 hours. ? ?ABG ?   ?Component Value Date/Time  ? PHART 7.409 08/02/2021 0701  ? PCO2ART 31.8 (L) 08/02/2021 0701  ? PO2ART 76 (L) 08/02/2021 0701  ? HCO3 20.1 08/02/2021 0701  ? TCO2 21 (L) 08/02/2021 0701  ? ACIDBASEDEF 4.0 (H) 08/02/2021 0701  ? O2SAT 95 08/02/2021 0701  ? ?  ? ?Coagulation Profile: ?Recent Labs  ?Lab 08/01/21 ?1149 08/01/21 ?1532  ?INR 1.6* 1.7*  ? ? ?Cardiac Enzymes: ?No results for input(s): CKTOTAL, CKMB, CKMBINDEX, TROPONINI in the last 168 hours. ? ?HbA1C: ?Hgb A1c MFr Bld  ?Date/Time Value Ref Range Status  ?08/01/2021 07:49 PM 5.5 4.8 - 5.6 % Final  ?  Comment:  ?  (NOTE) ?Pre diabetes:          5.7%-6.4% ? ?Diabetes:              >6.4% ? ?Glycemic control for   <7.0% ?adults with diabetes ?  ?08/24/2014 11:04 AM 5.9 (H) 4.8 - 5.6 % Final  ?  Comment:  ?  (NOTE) ?        Pre-diabetes: 5.7 - 6.4 ?        Diabetes: >6.4 ?        Glycemic control for adults with diabetes: <7.0 ?  ? ? ?CBG: ?Recent Labs  ?Lab 08/05/21 ?1122 08/05/21 ?1201 08/05/21 ?1649 08/05/21 ?2129 08/06/21 ?0543  ?GLUCAP 70 77 87 74 80  ? ? ? ?Baltazar Apo, MD, PhD ?08/06/2021,  9:23 AM ?[facility] ?[phone] or if no answer before 7:00PM call [phone] ?For any issues after 7:00PM please call eLink [phone] ? ? ?

## 2021-08-06 NOTE — Progress Notes (Signed)
IP rehab admissions - I met with patient at the bedside and I spoke to his SO, [name], by phone.  They would like inpatient rehab at [facility].  Will begin pre-auth process with [facility] medicare insurance carrier.  Call for questions.  [phone] ?

## 2021-08-06 NOTE — Progress Notes (Signed)
?[facility] ?Progress Note  ? ? ?Assessment/ Plan:   ?AKI on CKD 3a: BL Crt 1.3. initial AKI likely 2/2 contrast associate kidney injury and large swings in BP w/ low Bps at time. No obstruction on u/s. Now with ruptured dissection  s/p emergent repair, no filling of right renal artery but there is filling of left renal artery at completion with dissection extending into visceral segment.  Creatinine continues to be stable today at 2.8 ?-I think it is very unlikely that his creatinine will return to his previous baseline but hopefully will improve further. ?-He does have excess volume and urine output did decrease in the last 24 hrs. Will trial him with lasix 40mg  IV x 1 dose to start with, can redose lasix as needed ?-Could consider IV albumin once volume status is optimized given serum level is less than 2 ?-Continue to monitor daily Cr, Dose meds for GFR ?-Monitor Daily I/Os, Daily weight  ?-Maintain MAP>65 for optimal renal perfusion.  ?-Avoid nephrotoxic medications including NSAIDs ?-Use synthetic opioids (Fentanyl/Dilaudid) if needed ?  ?Thoracic dissection with aortic rupture ?-s/p emergent repair 3/20 ?-no filling of right renal artery as above ?-VVS following ? ?AHRF ?-Doing much better now extubated ?-Consider bronchodilators ?-Obtain chest x-ray and consider IV Lasix if pulmonary edema is present ? ?Hypotension/shock ?-off pressors now ? ?Metabolic acidosis ?-Serum bicarbonate stable.  Continue oral bicarbonate ?  ?Cocaine use disorder ?  ?Hep C: s/p treatment ?  ?LFT elevation: likely related to hypotension as well. Improving ?  ?Thrombocytopenia: persistent, per primary service ? ?Subjective:   ?Patient seen and examined bedside this AM. Rt facial droop noted yesterday--CTH without any acute findings. He reports that his breathing is stable. He is very upset that he is sitting in the recliner and wants to get back in bed. ?Uop 575cc  ? ?Objective:   ?BP 118/79   Pulse 99   Temp 97.6  ?F (36.4 ?C) (Oral)   Resp (!) 30   Ht 5\' 9"  (1.753 m)   Wt 97.1 kg   SpO2 97%   BMI 31.61 kg/m?  ? ?Intake/Output Summary (Last 24 hours) at 08/06/2021 0810 ?Last data filed at 08/06/2021 0801 ?Gross per 24 hour  ?Intake 246 ml  ?Output 450 ml  ?Net -204 ml  ? ?Weight change: -0.8 kg ? ?Physical Exam: ?Gen: no apparent distress, sitting up in chair ?CVS: Normal rate, no rub ?Resp: faint crackles at the bases ?CXK:GYJE, distended ?Ext: edema b/l LE's, warm and well perfused ?Neuro: awake, somewhat dysarthric ? ?Imaging: ?CT HEAD WO CONTRAST (5MM) ? ?Result Date: 08/05/2021 ?CLINICAL DATA:  Right facial droop.  Stroke suspected. EXAM: CT HEAD WITHOUT CONTRAST TECHNIQUE: Contiguous axial images were obtained from the base of the skull through the vertex without intravenous contrast. RADIATION DOSE REDUCTION: This exam was performed according to the departmental dose-optimization program which includes automated exposure control, adjustment of the mA and/or kV according to patient size and/or use of iterative reconstruction technique. COMPARISON:  None. FINDINGS: Brain: No evidence of acute infarction, hemorrhage, hydrocephalus, extra-axial collection or mass lesion/mass effect. Generalized parenchymal volume loss, within normal limits for patient age. Vascular: No hyperdense vessel or unexpected calcification. Skull: Normal. Negative for fracture or focal lesion. Sinuses/Orbits: No acute finding. Mild mucosal thickening in the right maxillary and left sphenoid sinuses. The other paranasal sinuses and mastoid air cells are clear. Other: None. IMPRESSION: No acute intracranial abnormality. Electronically Signed   By: Ileana Roup M.D.   On: 08/05/2021  17:46  ? ?DG CHEST PORT 1 VIEW ? ?Result Date: 08/05/2021 ?CLINICAL DATA:  71 year old male with shortness of breath. EXAM: PORTABLE CHEST - 1 VIEW COMPARISON:  08/02/2021, 08/01/2021 FINDINGS: Unchanged cardiomediastinal silhouette. Similar position of right internal  jugular central venous catheter with the tip in the central superior vena cava. Descending thoracic aortic endograft, unchanged. Unchanged left upper lobe periaortic atelectasis. No new focal consolidations. Improved aeration of the lung bases bilaterally. Improving bilateral trace pleural effusions with trace residual right pleural effusion. Unchanged appearance of median sternotomy wires. Right axillary surgical clips, unchanged. No acute osseous abnormality. IMPRESSION: 1. Improved aeration of the lungs bilaterally with resolving, trace bilateral pleural effusions. 2. Unchanged position of indwelling right internal jugular central venous catheter. Electronically Signed   By: Ruthann Cancer M.D.   On: 08/05/2021 07:56  ? ?DG Abd Portable 1V ? ?Result Date: 08/05/2021 ?CLINICAL DATA:  Denies any nausea or vomiting. EXAM: PORTABLE ABDOMEN - 1 VIEW COMPARISON:  Two days ago FINDINGS: Diffuse gaseous distension of small bowel. Large bowel loops are also affected. The enteric tube has been removed. Aortic stenting an IVC filter. IMPRESSION: Diffuse gaseous distension of bowel favoring ileus. An enteric tube has been removed without convincing worsening. Electronically Signed   By: Jorje Guild M.D.   On: 08/05/2021 06:11   ? ?Labs: ?BMET ?Recent Labs  ?Lab 08/01/21 ?1949 08/01/21 ?2001 08/02/21 ?7867 08/02/21 ?0701 08/02/21 ?2114 08/03/21 ?6720 08/04/21 ?9470 08/05/21 ?9628 08/06/21 ?3662  ?NA 133*   < > 138 138 137 138 139 141 142  ?K 4.3   < > 4.5 4.5 4.2 4.2 4.1 3.8 4.1  ?CL 102  --  108  --  109 109 113* 113* 114*  ?CO2 19*  --  18*  --  17* 17* 16* 19* 19*  ?GLUCOSE 223*  --  122*  --  73 79 100* 91 77  ?BUN 54*  --  53*  --  55* 54* 52* 48* 46*  ?CREATININE 3.05*  --  3.02*  --  3.25* 3.24* 2.96* 2.83* 2.79*  ?CALCIUM 8.4*  --  7.9*  --  7.7* 7.7* 7.7* 7.9* 8.0*  ?PHOS  --   --   --   --  4.2 4.4 4.0  --   --   ? < > = values in this interval not displayed.  ? ?CBC ?Recent Labs  ?Lab 08/01/21 ?1949  08/01/21 ?2001 08/02/21 ?2114 08/03/21 ?9476 08/04/21 ?5465 08/05/21 ?0354 08/06/21 ?6568  ?WBC 11.0*   < > 8.7 7.3 6.5 7.5 7.9  ?NEUTROABS 6.9  --  5.8  --   --   --   --   ?HGB 13.3   < > 11.0* 10.5* 9.3* 9.7* 9.8*  ?HCT 38.8*   < > 32.2* 31.3* 28.2* 30.3* 29.3*  ?MCV 85.1   < > 85.4 86.2 87.0 88.6 86.4  ?PLT 92*   < > 82* 77* 81* 97* 127*  ? < > = values in this interval not displayed.  ? ? ?Medications:   ? ? sodium chloride   Intravenous Once  ? bisacodyl  10 mg Rectal Daily  ? Chlorhexidine Gluconate Cloth  6 each Topical Daily  ? docusate sodium  100 mg Oral BID  ? folic acid  1 mg Oral Daily  ? heparin  5,000 Units Subcutaneous Q8H  ? insulin aspart  0-15 Units Subcutaneous TID AC & HS  ? mouth rinse  15 mL Mouth Rinse BID  ? multivitamin with  minerals  1 tablet Oral Daily  ? pantoprazole (PROTONIX) IV  40 mg Intravenous Daily  ? polyethylene glycol  17 g Oral Daily  ? sodium bicarbonate  1,300 mg Oral TID  ? sodium chloride flush  10-40 mL Intracatheter Q12H  ? thiamine  100 mg Oral Daily  ? Or  ? thiamine  100 mg Intravenous Daily  ? ? ? ? ?Gema Ringold Candiss Norse ? ?[facility] ?08/06/2021, 8:10 AM  ? ?

## 2021-08-06 NOTE — Progress Notes (Signed)
?  Progress Note ? ? ? ?08/06/2021 ?10:44 AM ?5 Days Post-Op ? ?Subjective:  Grumpy this morning with OOB to chair. Complaining of abdominal distention ? ?afebrile ? ?Vitals:  ? 08/06/21 0900 08/06/21 1000  ?BP: (!) 116/102 131/78  ?Pulse:  88  ?Resp: (!) 28 (!) 30  ?Temp:    ?SpO2:  (!) 53%  ? ? ?Physical Exam: ?Cardiac:  regular ?Lungs:  extubated; non labored ?Incisions:  right groin looks fine ?Extremities:  bounding DP pulses bilaterally.  Moving all extremities ?Abdomen: Distended, mild abdominal pain with palpation. Denies nausea/vomiting. ? ?CBC ?   ?Component Value Date/Time  ? WBC 7.9 08/06/2021 0534  ? RBC 3.39 (L) 08/06/2021 0534  ? HGB 9.8 (L) 08/06/2021 0534  ? HGB 13.8 10/21/2015 1515  ? HCT 29.3 (L) 08/06/2021 0534  ? HCT 39.7 10/21/2015 1515  ? PLT 127 (L) 08/06/2021 0534  ? PLT 139 (L) 10/21/2015 1515  ? MCV 86.4 08/06/2021 0534  ? MCV 88.0 10/21/2015 1515  ? MCH 28.9 08/06/2021 0534  ? MCHC 33.4 08/06/2021 0534  ? RDW 15.9 (H) 08/06/2021 0534  ? RDW 13.3 10/21/2015 1515  ? LYMPHSABS 1.7 08/02/2021 2114  ? LYMPHSABS 2.4 10/21/2015 1515  ? MONOABS 0.5 08/02/2021 2114  ? MONOABS 0.4 10/21/2015 1515  ? EOSABS 0.2 08/02/2021 2114  ? EOSABS 0.4 10/21/2015 1515  ? BASOSABS 0.1 08/02/2021 2114  ? BASOSABS 0.0 10/21/2015 1515  ? ? ?BMET ?   ?Component Value Date/Time  ? NA 142 08/06/2021 0534  ? NA 141 10/21/2015 1516  ? K 4.1 08/06/2021 0534  ? K 4.1 10/21/2015 1516  ? CL 114 (H) 08/06/2021 0534  ? CO2 19 (L) 08/06/2021 0534  ? CO2 21 (L) 10/21/2015 1516  ? GLUCOSE 77 08/06/2021 0534  ? GLUCOSE 114 10/21/2015 1516  ? BUN 46 (H) 08/06/2021 0534  ? BUN 21.6 10/21/2015 1516  ? CREATININE 2.79 (H) 08/06/2021 0534  ? CREATININE 1.4 (H) 10/21/2015 1516  ? CALCIUM 8.0 (L) 08/06/2021 0534  ? CALCIUM 9.3 10/21/2015 1516  ? GFRNONAA 23 (L) 08/06/2021 0534  ? GFRAA >90 08/30/2014 0243  ? ? ?INR ?   ?Component Value Date/Time  ? INR 1.7 (H) 08/01/2021 1532  ? ? ? ?Intake/Output Summary (Last 24 hours) at 08/06/2021  1044 ?Last data filed at 08/06/2021 0900 ?Gross per 24 hour  ?Intake 246 ml  ?Output 600 ml  ?Net -354 ml  ? ? ? ? ?Assessment/Plan:  71 y.o. male is s/p:  ?emergent repair of a ruptured descending thoracic dissection including extension of the dissection into the visceral segment with hemorrhagic shock  ?5 Days Post-Op ? ? ?-Palpable pedal pulses bilaterally ?-AKI continues to improve  ?-acute blood loss anemia-hgb stable  ?-Continues to move extremities, no subjective weakness ?___ ? ?Patient was reevaluated once back in bed.  Large bowel movement, feels much better. ?Can be transition to progressive status on 4 E. ?Can advance diet to regular ?PT/ OT ?Daily weights, appears to have significant fluid overload. Kidney recovering, hoping for continued autodiuresis.  ? ? ? ?Edward Hopkins  ?[facility] ?[phone] ?08/06/2021 ?10:44 AM ? ? ? ?

## 2021-08-07 DIAGNOSIS — R579 Shock, unspecified: Secondary | ICD-10-CM | POA: Diagnosis not present

## 2021-08-07 DIAGNOSIS — F1410 Cocaine abuse, uncomplicated: Secondary | ICD-10-CM | POA: Diagnosis not present

## 2021-08-07 DIAGNOSIS — I71019 Dissection of thoracic aorta, unspecified: Secondary | ICD-10-CM | POA: Diagnosis not present

## 2021-08-07 DIAGNOSIS — N179 Acute kidney failure, unspecified: Secondary | ICD-10-CM | POA: Diagnosis not present

## 2021-08-07 LAB — GLUCOSE, CAPILLARY
Glucose-Capillary: 111 mg/dL — ABNORMAL HIGH (ref 70–99)
Glucose-Capillary: 96 mg/dL (ref 70–99)
Glucose-Capillary: 82 mg/dL (ref 70–99)
Glucose-Capillary: 84 mg/dL (ref 70–99)

## 2021-08-07 MED ORDER — FUROSEMIDE 10 MG/ML IJ SOLN
60.0000 mg | Freq: Three times a day (TID) | INTRAMUSCULAR | Status: AC
  Administered 2021-08-07 – 2021-08-08 (×3): 60 mg via INTRAVENOUS
  Filled 2021-08-07 (×3): qty 6

## 2021-08-07 MED ORDER — FUROSEMIDE 10 MG/ML IJ SOLN: 40.0000 mg | Freq: Three times a day (TID) | INTRAMUSCULAR | Status: DC

## 2021-08-07 MED ORDER — MORPHINE SULFATE (PF) 2 MG/ML IV SOLN
2.0000 mg | INTRAVENOUS | Status: AC | PRN
  Administered 2021-08-07: 2 mg via INTRAVENOUS
  Filled 2021-08-07: qty 1

## 2021-08-07 MED ORDER — BISACODYL 5 MG PO TBEC
5.0000 mg | DELAYED_RELEASE_TABLET | Freq: Once | ORAL | Status: AC
  Administered 2021-08-09: 5 mg via ORAL
  Filled 2021-08-07: qty 1

## 2021-08-07 MED ORDER — MORPHINE SULFATE (PF) 2 MG/ML IV SOLN: 2.0000 mg | INTRAVENOUS | Status: DC | PRN

## 2021-08-07 MED ORDER — HYDRALAZINE HCL 20 MG/ML IJ SOLN: 10.0000 mg | INTRAMUSCULAR | Status: DC | PRN

## 2021-08-07 MED ORDER — IPRATROPIUM-ALBUTEROL 0.5-2.5 (3) MG/3ML IN SOLN: 3.0000 mL | RESPIRATORY_TRACT | Status: DC | PRN

## 2021-08-07 MED ORDER — METOPROLOL TARTRATE 5 MG/5ML IV SOLN
5.0000 mg | INTRAVENOUS | Status: DC | PRN
Start: 2021-08-07 — End: 2021-08-11

## 2021-08-07 MED ORDER — TRAZODONE HCL 50 MG PO TABS
50.0000 mg | ORAL_TABLET | Freq: Every evening | ORAL | Status: DC | PRN
  Administered 2021-08-10: 50 mg via ORAL
  Filled 2021-08-07: qty 1

## 2021-08-07 NOTE — PMR Pre-admission (Signed)
PMR Admission Coordinator Pre-Admission Assessment ? ?Patient: Edward Hopkins is an 71 y.o., male ?MRN: [id number] ?DOB: 1950-07-11 ?Height: '5\' 9"'  (175.3 cm) ?Weight: (!) 209.9 kg ? ?Insurance Information ?HMO: yes    PPO:      PCP:      IPA:      80/20:      OTHER: Group [id number] ?PRIMARY: U[employer] medicare      Policy#: [id number]      Subscriber: self ?CM Name: Wannetta Sender      Phone#: [phone]     Fax#: [phone] ?Pre-Cert#: [id number] approved for 7 days with update due on 08/17/21     Employer:  ?Benefits:  Phone #:      Name:  ?ax: [phone] ?Phone: Online - [email or web address] ?Eff Date: 05/15/2021 - still active ?Deductible: does not have one ?OOP Max: $8,300 ($49.18 met) ?CIR: $1,556/ admission copay ?SNF: $0.00 Copayment per day for days 1-20; $200 Copayment per day for days 21-100; Maximum of 100 days/benefit period ?Outpatient: 80% coverage; 20% co-insurance ?Home Health:  100% coverage, limited by medical necessity ?DME: 80% coverage; 20% co-insurance ?Providers: in network ? ?SECONDARY: Medicaid [place] access      Policy#: [id number]     Phone#: [phone] ? ?Financial Counselor:        Phone#:   ? ?The ?Data Collection Information Summary? for patients in Inpatient Rehabilitation Facilities with attached ?Privacy Act [place] Records? was provided and verbally reviewed with: Patient and Family ? ?Emergency Contact Information ?Contact Information   ? ? Name Relation Home Work Mobile  ? EdwardEdith Significant other [phone]  [phone]  ? ?  ? ? ?Current Medical History  ?Patient Admitting Diagnosis: Debility, post op repair of thoracic dissection with aortic rupture ? ?History of Present Illness: This is a 71 y.o. male with history of hypertension, polysubstance abuse including cocaine, known aortic dissection status post ascending aortic replacement including aorto innominate and aorto left carotid bypass by Dr. Cyndia Bent in 2016 that vascular surgery has been consulted for type B  dissection with worsening chest pain and hypotension today.  In review of the notes, he was admitted on 07/27/2021 with chest pain and found to have a new type B dissection.  This case was discussed with CT surgery.  He was admitted to the ICU for blood pressure management.  States his chest pain is worse today.  Also dealing with AKI on CKD stage III.  Hypotensive on the floor.  Systolics in the 27'P and responding to fluid. CTA  showed that patient's dissection was ruptured in the chest and he also had extended the dissection throughout the visceral segment with evidence of significant visceral malperfusion.  I discussed the grave prognosis and quoted him a greater than 90% risk of mortality.  Discussed almost certainly will require dialysis postoperative and will be at high risk for gut malperfusion in addition to multisystem organ failure and paralysis.  On 08/01/21 underwent emergent repair of descending type b thoracic dissection with aortic rupture by Dr. Carlis Abbott.  PT/OT evaluations were completed post op with recommendations for acute inpatient rehab admission. ?  ? ?Patient's medical record from [facility] has been reviewed by the rehabilitation admission coordinator and physician. ? ?Past Medical History  ?Past Medical History:  ?Diagnosis Date  ? CHF (congestive heart failure) ([place])   ? Constipation   ? DVT (deep venous thrombosis) ([place]) 1995  ? Endocarditis   ? Hypertension   ? Intramural aortic hematoma ([facility])   ? Osteomyelitis ([place])   ?  Right femoral vein DVT ([place]) 07/2014  ? Septic arthritis ([place])   ? both hips  (08/04/2014)  ? ? ?Has the patient had major surgery during 100 days prior to admission? Yes ? ?Family History   ?family history is not on file. ? ?Current Medications ? ?Current Facility-Administered Medications:  ?  0.9 %  sodium chloride infusion (Manually program via Guardrails IV Fluids), , Intravenous, Once, Byrum, Rose Fillers, MD ?  0.9 %  sodium chloride infusion, 10 mL/hr, Intravenous, Once,  Byrum, Rose Fillers, MD ?  acetaminophen (TYLENOL) tablet 650 mg, 650 mg, Oral, Q6H PRN, Collene Gobble, MD, 650 mg at 07/29/21 0536 ?  alum & mag hydroxide-simeth (MAALOX/MYLANTA) 200-200-20 MG/5ML suspension 15-30 mL, 15-30 mL, Oral, Q2H PRN, Byrum, Rose Fillers, MD ?  artificial tears (LACRILUBE) ophthalmic ointment, , Both Eyes, QHS PRN, Collene Gobble, MD, Given at 08/03/21 0002 ?  bisacodyl (DULCOLAX) suppository 10 mg, 10 mg, Rectal, Daily, Collene Gobble, MD, 10 mg at 08/10/21 0901 ?  Darbepoetin Alfa (ARANESP) injection 100 mcg, 100 mcg, Subcutaneous, Q Tue-1800, Corliss Parish, MD, 100 mcg at 08/09/21 1740 ?  docusate sodium (COLACE) capsule 100 mg, 100 mg, Oral, BID PRN, Collene Gobble, MD, 100 mg at 08/04/21 2300 ?  docusate sodium (COLACE) capsule 100 mg, 100 mg, Oral, BID, Collene Gobble, MD, 100 mg at 08/11/21 7829 ?  folic acid (FOLVITE) tablet 1 mg, 1 mg, Oral, Daily, Byrum, Rose Fillers, MD, 1 mg at 08/11/21 5621 ?  furosemide (LASIX) tablet 40 mg, 40 mg, Oral, Daily, Corliss Parish, MD, 40 mg at 08/11/21 3086 ?  guaiFENesin-dextromethorphan (ROBITUSSIN DM) 100-10 MG/5ML syrup 15 mL, 15 mL, Oral, Q4H PRN, Byrum, Rose Fillers, MD ?  heparin injection 5,000 Units, 5,000 Units, Subcutaneous, Q8H, Collene Gobble, MD, 5,000 Units at 08/11/21 0700 ?  hydrALAZINE (APRESOLINE) injection 10 mg, 10 mg, Intravenous, Q4H PRN, Amin, Ankit Chirag, MD ?  insulin aspart (novoLOG) injection 0-15 Units, 0-15 Units, Subcutaneous, TID AC & HS, Byrum, Rose Fillers, MD, 3 Units at 08/10/21 1216 ?  ipratropium-albuterol (DUONEB) 0.5-2.5 (3) MG/3ML nebulizer solution 3 mL, 3 mL, Nebulization, Q4H PRN, Amin, Ankit Chirag, MD ?  magnesium sulfate IVPB 2 g 50 mL, 2 g, Intravenous, Daily PRN, Collene Gobble, MD ?  MEDLINE mouth rinse, 15 mL, Mouth Rinse, BID, Collene Gobble, MD, 15 mL at 08/11/21 5784 ?  metoprolol tartrate (LOPRESSOR) injection 5 mg, 5 mg, Intravenous, Q4H PRN, Amin, Ankit Chirag, MD ?  multivitamin with  minerals tablet 1 tablet, 1 tablet, Oral, Daily, Collene Gobble, MD, 1 tablet at 08/11/21 [phone] ?  ondansetron (ZOFRAN) injection 4 mg, 4 mg, Intravenous, Q6H PRN, Byrum, Rose Fillers, MD ?  oxyCODONE (Oxy IR/ROXICODONE) immediate release tablet 5-10 mg, 5-10 mg, Oral, Q6H PRN, Collene Gobble, MD, 10 mg at 08/11/21 9528 ?  pantoprazole (PROTONIX) EC tablet 40 mg, 40 mg, Oral, Daily, Amin, Ankit Chirag, MD, 40 mg at 08/11/21 4132 ?  phenol (CHLORASEPTIC) mouth spray 1 spray, 1 spray, Mouth/Throat, PRN, Byrum, Rose Fillers, MD ?  polyethylene glycol (MIRALAX / GLYCOLAX) packet 17 g, 17 g, Oral, Daily PRN, Byrum, Rose Fillers, MD ?  polyethylene glycol (MIRALAX / GLYCOLAX) packet 17 g, 17 g, Oral, Daily, Collene Gobble, MD, 17 g at 08/11/21 4401 ?  potassium chloride SA (KLOR-CON M) CR tablet 20-40 mEq, 20-40 mEq, Oral, Daily PRN, Collene Gobble, MD ?  thiamine tablet 100 mg, 100 mg, Oral, Daily,  100 mg at 08/11/21 0924 **OR** thiamine (B-1) injection 100 mg, 100 mg, Intravenous, Daily, Collene Gobble, MD, 100 mg at 08/04/21 9295 ?  traZODone (DESYREL) tablet 50 mg, 50 mg, Oral, QHS PRN, Amin, Ankit Chirag, MD, 50 mg at 08/10/21 2239 ? ?Patients Current Diet:  ?Diet Order   ? ?       ?  Diet heart healthy/carb modified Room service appropriate? Yes with Assist; Fluid consistency: Thin  Diet effective now       ?  ? ?  ?  ? ?  ? ? ?Precautions / Restrictions ?Precautions ?Precautions: Fall, Other (comment) ?Precaution Comments: watch 02 ?Restrictions ?Weight Bearing Restrictions: No ?RLE Weight Bearing: Non weight bearing  ? ?Has the patient had 2 or more falls or a fall with injury in the past year? Yes ? ?Prior Activity Level ?Community (5-7x/wk): Took walks to visit his friends 4-5 times a week ? ?Prior Functional Level ?Self Care: Did the patient need help bathing, dressing, using the toilet or eating? Independent ? ?Indoor Mobility: Did the patient need assistance with walking from room to room (with or without device)?  Independent ? ?Stairs: Did the patient need assistance with internal or external stairs (with or without device)? Independent ? ?Functional Cognition: Did the patient need help planning regular tasks such as shop

## 2021-08-07 NOTE — Progress Notes (Signed)
?[facility] ?Progress Note  ? ? ?Assessment/ Plan:   ?AKI on CKD 3a: BL Crt 1.3. initial AKI likely 2/2 contrast associate kidney injury and large swings in BP w/ low Bps at time. No obstruction on u/s. Now with ruptured dissection  s/p emergent repair, no filling of right renal artery but there is filling of left renal artery at completion with dissection extending into visceral segment.  Creatinine continues to be stable yesterday at 2.8. no labs yet today. ?-I think it is very unlikely that his creatinine will return to his previous baseline esp given right kidney compromise ?-continues to be hypervolemic. Will adjust lasix dosing today: 60mg  q8h for 1 day and will reassess/redose if needed ?-Could consider IV albumin once volume status is optimized given serum level is less than 2 ?-Continue to monitor daily Cr, Dose meds for GFR ?-Monitor Daily I/Os, Daily weight  ?-Maintain MAP>65 for optimal renal perfusion.  ?-Avoid nephrotoxic medications including NSAIDs ?-Use synthetic opioids (Fentanyl/Dilaudid) if needed ?  ?Thoracic dissection with aortic rupture ?-s/p emergent repair 3/20 ?-no filling of right renal artery as above ?-VVS following ? ?AHRF ?-Consider bronchodilators ?-lasix as above ? ?Hypotension/shock ?-off pressors now ? ?Metabolic acidosis ?-Serum bicarbonate stable.  Continue oral bicarbonate ?  ?Cocaine use disorder ?-per primary service ?  ?Hep C: s/p treatment ?  ?LFT elevation: likely related to hypotension as well. Improved ?  ?Thrombocytopenia: persistent, per primary service ? ?Subjective:   ?Patient seen and examined bedside this AM. Out of ICU now. He reports issues with burning of his eyes. Laying flat currently as he is about to be changed and quite dyspenic. Recevied lasix 40mg  iv x 1 dose yesterday and urine output was 1.7L which was better than yesterday. ?Labs not drawn yet  ? ?Objective:   ?BP 92/63 (BP Location: Right Arm)   Pulse 85   Temp 97.9 ?F (36.6 ?C)  (Oral)   Resp 20   Ht 5\' 9"  (1.753 m)   Wt 97 kg   SpO2 95%   BMI 31.58 kg/m?  ? ?Intake/Output Summary (Last 24 hours) at 08/07/2021 0919 ?Last data filed at 08/06/2021 2000 ?Gross per 24 hour  ?Intake 480 ml  ?Output 1550 ml  ?Net -1070 ml  ? ?Weight change: -0.1 kg ? ?Physical Exam: ?Gen: laying flat in bed, in mild resp distress ?CVS: Normal rate, no rub ?Resp: faint crackles at the bases ?XFG:HWEX, distended ?Ext: 3+ pitting edema b/l LEs ?Neuro: awake, alert ? ?Imaging: ?CT HEAD WO CONTRAST (5MM) ? ?Result Date: 08/05/2021 ?CLINICAL DATA:  Right facial droop.  Stroke suspected. EXAM: CT HEAD WITHOUT CONTRAST TECHNIQUE: Contiguous axial images were obtained from the base of the skull through the vertex without intravenous contrast. RADIATION DOSE REDUCTION: This exam was performed according to the departmental dose-optimization program which includes automated exposure control, adjustment of the mA and/or kV according to patient size and/or use of iterative reconstruction technique. COMPARISON:  None. FINDINGS: Brain: No evidence of acute infarction, hemorrhage, hydrocephalus, extra-axial collection or mass lesion/mass effect. Generalized parenchymal volume loss, within normal limits for patient age. Vascular: No hyperdense vessel or unexpected calcification. Skull: Normal. Negative for fracture or focal lesion. Sinuses/Orbits: No acute finding. Mild mucosal thickening in the right maxillary and left sphenoid sinuses. The other paranasal sinuses and mastoid air cells are clear. Other: None. IMPRESSION: No acute intracranial abnormality. Electronically Signed   By: Ileana Roup M.D.   On: 08/05/2021 17:46   ? ?Labs: ?BMET ?Recent Labs  ?  Lab 08/01/21 ?1949 08/01/21 ?2001 08/02/21 ?4562 08/02/21 ?0701 08/02/21 ?2114 08/03/21 ?5638 08/04/21 ?9373 08/05/21 ?4287 08/06/21 ?6811  ?NA 133*   < > 138 138 137 138 139 141 142  ?K 4.3   < > 4.5 4.5 4.2 4.2 4.1 3.8 4.1  ?CL 102  --  108  --  109 109 113* 113* 114*  ?CO2  19*  --  18*  --  17* 17* 16* 19* 19*  ?GLUCOSE 223*  --  122*  --  73 79 100* 91 77  ?BUN 54*  --  53*  --  55* 54* 52* 48* 46*  ?CREATININE 3.05*  --  3.02*  --  3.25* 3.24* 2.96* 2.83* 2.79*  ?CALCIUM 8.4*  --  7.9*  --  7.7* 7.7* 7.7* 7.9* 8.0*  ?PHOS  --   --   --   --  4.2 4.4 4.0  --   --   ? < > = values in this interval not displayed.  ? ?CBC ?Recent Labs  ?Lab 08/01/21 ?1949 08/01/21 ?2001 08/02/21 ?2114 08/03/21 ?5726 08/04/21 ?2035 08/05/21 ?5974 08/06/21 ?1638  ?WBC 11.0*   < > 8.7 7.3 6.5 7.5 7.9  ?NEUTROABS 6.9  --  5.8  --   --   --   --   ?HGB 13.3   < > 11.0* 10.5* 9.3* 9.7* 9.8*  ?HCT 38.8*   < > 32.2* 31.3* 28.2* 30.3* 29.3*  ?MCV 85.1   < > 85.4 86.2 87.0 88.6 86.4  ?PLT 92*   < > 82* 77* 81* 97* 127*  ? < > = values in this interval not displayed.  ? ? ?Medications:   ? ? sodium chloride   Intravenous Once  ? bisacodyl  10 mg Rectal Daily  ? Chlorhexidine Gluconate Cloth  6 each Topical Daily  ? docusate sodium  100 mg Oral BID  ? folic acid  1 mg Oral Daily  ? furosemide  40 mg Intravenous Q8H  ? heparin  5,000 Units Subcutaneous Q8H  ? insulin aspart  0-15 Units Subcutaneous TID AC & HS  ? mouth rinse  15 mL Mouth Rinse BID  ? multivitamin with minerals  1 tablet Oral Daily  ? pantoprazole (PROTONIX) IV  40 mg Intravenous Daily  ? polyethylene glycol  17 g Oral Daily  ? sodium bicarbonate  1,300 mg Oral TID  ? sodium chloride flush  10-40 mL Intracatheter Q12H  ? thiamine  100 mg Oral Daily  ? Or  ? thiamine  100 mg Intravenous Daily  ? ? ? ? ?Edward Hopkins Candiss Norse ? ?[facility] ?08/07/2021, 9:19 AM  ? ?

## 2021-08-07 NOTE — Progress Notes (Addendum)
[facility] [place] ? ?Subjective  - Alert and moving all 4 ext. ? ? ?Objective ?92/63 ?85 ?97.9 ?F (36.6 ?C) (Oral) ?20 ?95% ? ?Intake/Output Summary (Last 24 hours) at 08/07/2021 0920 ?Last data filed at 08/06/2021 2000 ?Gross per 24 hour  ?Intake 480 ml  ?Output 1550 ml  ?Net -1070 ml  ? ? ?Abdomin distended with + BS ?Feet warm, edema present, sensation intact, palpable DP B ?Right groin soft, incision healing well ?General no acute distress ?Lungs 3L [place] O 2 support, no labored   ? ?Assessment/Planning: ?POD # 6 ?71 y.o. male is s/p:  ?emergent repair of a ruptured descending thoracic dissection including extension of the dissection into the visceral segment with hemorrhagic shock  ? ?HGB stable 9.7 ?Moving all extremities ?Abdomin distended with + BS, ordered dulcolax ?Afebrile without leukocytosis ?Will check new chest x ray  ? ? ?Roxy Horseman ?08/07/2021 ?9:20 AM ?-- ? ?VASCULAR STAFF ADDENDUM: ?I have independently interviewed and examined the patient. ?I agree with the above.  ? ?J. Melene Muller, MD ?[facility] ?Office Phone Number: [phone] ?08/07/2021 10:36 AM ? ? ? ?Laboratory ?Lab Results: ?Recent Labs  ?  08/05/21 ?0326 08/06/21 ?3716  ?WBC 7.5 7.9  ?HGB 9.7* 9.8*  ?HCT 30.3* 29.3*  ?PLT 97* 127*  ? ?BMET ?Recent Labs  ?  08/05/21 ?0326 08/06/21 ?9678  ?NA 141 142  ?K 3.8 4.1  ?CL 113* 114*  ?CO2 19* 19*  ?GLUCOSE 91 77  ?BUN 48* 46*  ?CREATININE 2.83* 2.79*  ?CALCIUM 7.9* 8.0*  ? ? ?COAG ?Lab Results  ?Component Value Date  ? INR 1.7 (H) 08/01/2021  ? INR 1.6 (H) 08/01/2021  ? INR 1.1 07/27/2021  ? ?No results found for: PTT ? ? ? ?

## 2021-08-07 NOTE — Progress Notes (Addendum)
?PROGRESS NOTE ? ? ? ?Edward Hopkins  XHB:[id number] DOB: 25-Apr-1951 DOA: 07/27/2021 ?PCP: Pcp, No  ? ?Brief Narrative:  ?71 year old with history of hepatitis C, HTN, polysubstance abuse including cocaine, aortic dissection status post thoracic aortic graft repair, thoracic aortic aneurysm comes to the hospital on 3/15 with complaints of chest pain.  Found to have type B dissection with flap distal to left subclavian artery extending into SMA on the CT of the chest.  Initially patient was monitored in the ICU with aggressive blood pressure control and eventually transferred on 3/17.  On the morning of 3/20 developed acute hypotension and repeat CTA showed extension of his dissection and was taken to the OR emergently.  This was repaired and taken back to the ICU where he remained intubated and on multiple pressors.  CT head was negative.  Eventually was extubated on 3/24.  Hospital course was also complicated by acute kidney injury therefore nephrology team is following. ? ? ?Assessment & Plan: ? Principal Problem: ?  Dissection of aorta ([place]) ?Active Problems: ?  Acute kidney injury ([place]) ?  Cocaine abuse ([place]) ?  Shock ([place]) ?  ? ?Ruptured aortic aneurysm ?-Recurrent rupture of the aneurysm initially managed medically but later on 3/20 developed acute hypotension with extension of his renal dissection requiring urgent surgical repair by vascular.  Was in the ICU intubated on multiple pressors.  Eventually extubated 3/24. ?-Current blood pressure medications-amlodipine.  Metoprolol on hold ? ?  ?Acute Hypoxic Respiratory Failure:  ?-Secondary to fluid overload.  Pulmonary edema and pleural effusions.  Received diuretics. Lasix 40mg  iv x 2 today ?  ?Acute Kidney Injury ?Metabolic acidosis ?-Suspect from low blood pressure swings or contrast-induced.  Nephrology team following. ?-Baseline 1.2.  Peaked 3.5. Cr Today 2.79 ?  ?Ileus  ?-Improved.  Bowel regimen as needed. ?  ?Right facial droop and some right lower  extremity weakness, subacute.  No known history of CVA ?-CT of the head is negative.  Mentation appears to be at baseline therefore we will hold off on any further imaging. ?  ?Cocaine use disorder ?Bilateral inguinal hernias (L>R) ?Counseled to quit using this ? ?History of hepatitis C ?- Status post treatment ? ?Transaminitis ?- Resolved ? ?PT/OT-recommended CIR ? ?DVT prophylaxis: SQ Hep ?Code Status: Full ?Family Communication:   ? ?Status is: Inpatient ?Remains inpatient appropriate because: CIR once cleared by Vasc and Nephrology. In the meantime maintain hosp stay due to AKI ? ? ? ? ?Subjective: ?He is very argumentative this morning.  But does not have any complaints.  Keeps asking for pain medications.  Being very vague about why he needs these. ? ? ? ?Examination: ? ?Constitutional: Not in acute distress, chronically ill-appearing. ?Respiratory: Diminished breath sounds at bilateral bases ?Cardiovascular: Normal sinus rhythm, no rubs ?Abdomen: Nontender nondistended good bowel sounds ?Musculoskeletal: No edema noted ?Skin: No rashes seen ?Neurologic: CN 2-12 grossly intact.  And nonfocal ?Psychiatric: Normal judgment and insight. Alert and oriented x 3. Normal mood. ? ?Objective: ?Vitals:  ? 08/06/21 1636 08/06/21 1958 08/06/21 2330 08/07/21 0436  ?BP:  122/84 108/76 92/63  ?Pulse: 84 88 90 85  ?Resp: (!) 22 20 20  (!) 21  ?Temp:  98.4 ?F (36.9 ?C) 98 ?F (36.7 ?C) 97.9 ?F (36.6 ?C)  ?TempSrc:  Oral Oral Oral  ?SpO2: 96% 99% 93% 90%  ?Weight:      ?Height:      ? ? ?Intake/Output Summary (Last 24 hours) at 08/07/2021 0538 ?Last data filed at 08/06/2021  2000 ?Gross per 24 hour  ?Intake 476 ml  ?Output 1700 ml  ?Net -1224 ml  ? ?Filed Weights  ? 08/04/21 0427 08/05/21 0500 08/06/21 0325  ?Weight: 100.5 kg 97.9 kg 97.1 kg  ? ? ? ?Data Reviewed:  ? ?CBC: ?Recent Labs  ?Lab 08/01/21 ?1949 08/01/21 ?2001 08/02/21 ?2114 08/03/21 ?1740 08/04/21 ?8144 08/05/21 ?8185 08/06/21 ?6314  ?WBC 11.0*   < > 8.7 7.3 6.5 7.5 7.9   ?NEUTROABS 6.9  --  5.8  --   --   --   --   ?HGB 13.3   < > 11.0* 10.5* 9.3* 9.7* 9.8*  ?HCT 38.8*   < > 32.2* 31.3* 28.2* 30.3* 29.3*  ?MCV 85.1   < > 85.4 86.2 87.0 88.6 86.4  ?PLT 92*   < > 82* 77* 81* 97* 127*  ? < > = values in this interval not displayed.  ? ?Basic Metabolic Panel: ?Recent Labs  ?Lab 08/02/21 ?9702 08/02/21 ?2114 08/03/21 ?6378 08/04/21 ?0248 08/05/21 ?0326 08/06/21 ?5885  ?NA  --  137 138 139 141 142  ?K  --  4.2 4.2 4.1 3.8 4.1  ?CL  --  109 109 113* 113* 114*  ?CO2  --  17* 17* 16* 19* 19*  ?GLUCOSE  --  73 79 100* 91 77  ?BUN  --  55* 54* 52* 48* 46*  ?CREATININE  --  3.25* 3.24* 2.96* 2.83* 2.79*  ?CALCIUM  --  7.7* 7.7* 7.7* 7.9* 8.0*  ?MG 2.1 2.1 2.2 2.3  --  2.3  ?PHOS  --  4.2 4.4 4.0  --   --   ? ?GFR: ?Estimated Creatinine Clearance: 27.1 mL/min (A) (by C-G formula based on SCr of 2.79 mg/dL (H)). ?Liver Function Tests: ?Recent Labs  ?Lab 07/31/21 ?0926 08/01/21 ?1949 08/02/21 ?0277 08/04/21 ?4128 08/05/21 ?0326  ?AST 106* 38 35 29 29  ?ALT 138* 63* 49* 11 11  ?ALKPHOS 78 67 67 20 94  ?BILITOT 0.8 3.0* 2.8* 3.9* 3.7*  ?PROT 6.6 5.2* 5.3* 5.3* 5.6*  ?ALBUMIN 2.3* 1.9* 2.1* 1.7* 1.8*  ? ?No results for input(s): LIPASE, AMYLASE in the last 168 hours. ?No results for input(s): AMMONIA in the last 168 hours. ?Coagulation Profile: ?Recent Labs  ?Lab 08/01/21 ?1149 08/01/21 ?1532  ?INR 1.6* 1.7*  ? ?Cardiac Enzymes: ?No results for input(s): CKTOTAL, CKMB, CKMBINDEX, TROPONINI in the last 168 hours. ?BNP (last 3 results) ?No results for input(s): PROBNP in the last 8760 hours. ?HbA1C: ?No results for input(s): HGBA1C in the last 72 hours. ?CBG: ?Recent Labs  ?Lab 08/05/21 ?1649 08/05/21 ?2129 08/06/21 ?0543 08/06/21 ?1137 08/06/21 ?2135  ?GLUCAP 87 74 80 111* 112*  ? ?Lipid Profile: ?No results for input(s): CHOL, HDL, LDLCALC, TRIG, CHOLHDL, LDLDIRECT in the last 72 hours. ?Thyroid Function Tests: ?No results for input(s): TSH, T4TOTAL, FREET4, T3FREE, THYROIDAB in the last 72  hours. ?Anemia Panel: ?No results for input(s): VITAMINB12, FOLATE, FERRITIN, TIBC, IRON, RETICCTPCT in the last 72 hours. ?Sepsis Labs: ?Recent Labs  ?Lab 08/01/21 ?1742 08/02/21 ?7096 08/02/21 ?2836 08/05/21 ?0440  ?LATICACIDVEN 1.8 1.5 1.4 0.7  ? ? ?No results found for this or any previous visit (from the past 240 hour(s)).  ? ? ? ? ? ?Radiology Studies: ?CT HEAD WO CONTRAST (5MM) ? ?Result Date: 08/05/2021 ?CLINICAL DATA:  Right facial droop.  Stroke suspected. EXAM: CT HEAD WITHOUT CONTRAST TECHNIQUE: Contiguous axial images were obtained from the base of the skull through the vertex without intravenous contrast. RADIATION  DOSE REDUCTION: This exam was performed according to the departmental dose-optimization program which includes automated exposure control, adjustment of the mA and/or kV according to patient size and/or use of iterative reconstruction technique. COMPARISON:  None. FINDINGS: Brain: No evidence of acute infarction, hemorrhage, hydrocephalus, extra-axial collection or mass lesion/mass effect. Generalized parenchymal volume loss, within normal limits for patient age. Vascular: No hyperdense vessel or unexpected calcification. Skull: Normal. Negative for fracture or focal lesion. Sinuses/Orbits: No acute finding. Mild mucosal thickening in the right maxillary and left sphenoid sinuses. The other paranasal sinuses and mastoid air cells are clear. Other: None. IMPRESSION: No acute intracranial abnormality. Electronically Signed   By: Ileana Roup M.D.   On: 08/05/2021 17:46  ? ?DG CHEST PORT 1 VIEW ? ?Result Date: 08/05/2021 ?CLINICAL DATA:  71 year old male with shortness of breath. EXAM: PORTABLE CHEST - 1 VIEW COMPARISON:  08/02/2021, 08/01/2021 FINDINGS: Unchanged cardiomediastinal silhouette. Similar position of right internal jugular central venous catheter with the tip in the central superior vena cava. Descending thoracic aortic endograft, unchanged. Unchanged left upper lobe periaortic  atelectasis. No new focal consolidations. Improved aeration of the lung bases bilaterally. Improving bilateral trace pleural effusions with trace residual right pleural effusion. Unchanged appearance of median sternotomy

## 2021-08-08 ENCOUNTER — Inpatient Hospital Stay (HOSPITAL_COMMUNITY): Payer: Medicare Other

## 2021-08-08 DIAGNOSIS — F1410 Cocaine abuse, uncomplicated: Secondary | ICD-10-CM | POA: Diagnosis not present

## 2021-08-08 DIAGNOSIS — R579 Shock, unspecified: Secondary | ICD-10-CM | POA: Diagnosis not present

## 2021-08-08 DIAGNOSIS — I71019 Dissection of thoracic aorta, unspecified: Secondary | ICD-10-CM | POA: Diagnosis not present

## 2021-08-08 DIAGNOSIS — N179 Acute kidney failure, unspecified: Secondary | ICD-10-CM | POA: Diagnosis not present

## 2021-08-08 LAB — GLUCOSE, CAPILLARY
Glucose-Capillary: 93 mg/dL (ref 70–99)
Glucose-Capillary: 93 mg/dL (ref 70–99)
Glucose-Capillary: 102 mg/dL — ABNORMAL HIGH (ref 70–99)
Glucose-Capillary: 73 mg/dL (ref 70–99)

## 2021-08-08 LAB — BASIC METABOLIC PANEL
Anion gap: 7 (ref 5–15)
BUN: 47 mg/dL — ABNORMAL HIGH (ref 8–23)
CO2: 27 mmol/L (ref 22–32)
Calcium: 8.1 mg/dL — ABNORMAL LOW (ref 8.9–10.3)
Chloride: 107 mmol/L (ref 98–111)
Creatinine, Ser: 2.62 mg/dL — ABNORMAL HIGH (ref 0.61–1.24)
GFR, Estimated: 25 mL/min — ABNORMAL LOW (ref >60)
Glucose, Bld: 98 mg/dL (ref 70–99)
Potassium: 4.1 mmol/L (ref 3.5–5.1)
Sodium: 141 mmol/L (ref 135–145)

## 2021-08-08 LAB — CBC
HCT: 31.2 % — ABNORMAL LOW (ref 39.0–52.0)
Hemoglobin: 10.2 g/dL — ABNORMAL LOW (ref 13.0–17.0)
MCH: 28.5 pg (ref 26.0–34.0)
MCHC: 32.7 g/dL (ref 30.0–36.0)
MCV: 87.2 fL (ref 80.0–100.0)
Platelets: 190 10*3/uL (ref 150–400)
RBC: 3.58 MIL/uL — ABNORMAL LOW (ref 4.22–5.81)
RDW: 15.9 % — ABNORMAL HIGH (ref 11.5–15.5)
WBC: 8.9 10*3/uL (ref 4.0–10.5)
nRBC: 0 % (ref 0.0–0.2)

## 2021-08-08 LAB — MAGNESIUM: Magnesium: 2.1 mg/dL (ref 1.7–2.4)

## 2021-08-08 MED ORDER — FUROSEMIDE 10 MG/ML IJ SOLN: 60.0000 mg | Freq: Three times a day (TID) | INTRAMUSCULAR | Status: DC

## 2021-08-08 MED ORDER — SODIUM BICARBONATE 650 MG PO TABS
650.0000 mg | ORAL_TABLET | Freq: Three times a day (TID) | ORAL | Status: DC
Start: 2021-08-08 — End: 2021-08-10
  Administered 2021-08-08 – 2021-08-10 (×7): 650 mg via ORAL
  Filled 2021-08-08 (×6): qty 1

## 2021-08-08 MED ORDER — FUROSEMIDE 10 MG/ML IJ SOLN
60.0000 mg | Freq: Once | INTRAMUSCULAR | Status: AC
  Administered 2021-08-08: 60 mg via INTRAVENOUS
  Filled 2021-08-08: qty 6

## 2021-08-08 MED ORDER — ALBUMIN HUMAN 25 % IV SOLN
25.0000 g | Freq: Four times a day (QID) | INTRAVENOUS | Status: AC
  Administered 2021-08-08 – 2021-08-09 (×3): 25 g via INTRAVENOUS
  Filled 2021-08-08 (×3): qty 100

## 2021-08-08 NOTE — Progress Notes (Signed)
?PROGRESS NOTE ? ? ? ?Edward Hopkins  IDP:[id number] DOB: [birth year] DOA: 07/27/2021 ?PCP: Pcp, No  ? ?Brief Narrative:  ?71 year old with history of hepatitis C, HTN, polysubstance abuse including cocaine, aortic dissection status post thoracic aortic graft repair, thoracic aortic aneurysm comes to the hospital on 3/15 with complaints of chest pain.  Found to have type B dissection with flap distal to left subclavian artery extending into SMA on the CT of the chest.  Initially patient was monitored in the ICU with aggressive blood pressure control and eventually transferred on 3/17.  On the morning of 3/20 developed acute hypotension and repeat CTA showed extension of his dissection and was taken to the OR emergently.  This was repaired and taken back to the ICU where he remained intubated and on multiple pressors.  CT head was negative.  Eventually was extubated on 3/24.  Hospital course was also complicated by acute kidney injury therefore nephrology team is following. ? ? ?Assessment & Plan: ? Principal Problem: ?  Dissection of aorta (Edward Hopkins) ?Active Problems: ?  Acute kidney injury (Edward Hopkins) ?  Cocaine abuse (Edward Hopkins) ?  Shock (Edward Hopkins) ?  ? ?Ruptured aortic aneurysm ?-Recurrent rupture of the aneurysm initially managed medically but later on 3/20 developed acute hypotension with extension of his renal dissection requiring urgent surgical repair by vascular.  Was in the ICU intubated on multiple pressors.  Eventually extubated 3/24. ?-BP meds-amlodipine.  Metoprolol on hold ? ?  ?Acute Hypoxic Respiratory Failure:  ?-Secondary to fluid overload.  Pulmonary edema and pleural effusions. On Diuretics  Monitor Cr ?  ?Acute Kidney Injury; slowly improving with diuresis ?Metabolic acidosis ?-Suspect from BP swings or contrast-induced. Renal following. ?-Baseline 1.2.  Peaked 3.5. Cr 2.79 > 2.62.  Give additional Lasix due to signs of volume overload along with Albumin 25mg  x 3 doses.  ?  ?Ileus  ?-Improved.  Bowel regimen as  needed. ?  ?Right facial droop and some right lower extremity weakness, subacute.  No known history of CVA ?-CT of the head is negative.  Mentation appears to be at baseline therefore we will hold off on any further imaging. ?  ?Cocaine use disorder ?Bilateral inguinal hernias (L>R) ?Counseled to quit using this ? ?History of hepatitis C ?- Status post treatment ? ?Transaminitis ?- Resolved ? ?PT/OT-recommended CIR ? ?DVT prophylaxis: SQ Hep ?Code Status: Full ?Family Communication:   ? ?Status is: Inpatient ?Remains inpatient appropriate because: CIR once cleared by Vasc and Nephrology. In the meantime maintain hosp stay due to AKI ? ? ? ? ?Subjective: ?Sitting up in the chair.  Tells me he feels very tired and still has swelling.  He does want his pain medications during the time of visit. ? ?Examination: ? ?Constitutional: Not in acute distress, chronically ill-appearing. ?Respiratory: Diminished breath sounds at bilateral bases ?Cardiovascular: Normal sinus rhythm, no rubs ?Abdomen: Nontender nondistended good bowel sounds ?Musculoskeletal: 2+ bilateral lower extremity pitting edema ?Skin: No rashes seen ?Neurologic: CN 2-12 grossly intact.  And nonfocal ?Psychiatric: Normal judgment and insight. Alert and oriented x 3. Normal mood. ? ?Objective: ?Vitals:  ? 08/07/21 1900 08/08/21 0000 08/08/21 0420 08/08/21 0801  ?BP: 99/65 105/65 (!) 152/85 112/72  ?Pulse: 76 76 92 84  ?Resp: 20 17 20 19   ?Temp: 98.1 ?F (36.7 ?C) 98 ?F (36.7 ?C) 98.3 ?F (36.8 ?C) 98 ?F (36.7 ?C)  ?TempSrc: Oral Oral Oral Oral  ?SpO2: 98% 96% 94% 92%  ?Weight:      ?Height:      ? ? ?  Intake/Output Summary (Last 24 hours) at 08/08/2021 0819 ?Last data filed at 08/08/2021 2947 ?Gross per 24 hour  ?Intake --  ?Output 3585 ml  ?Net -3585 ml  ? ?Filed Weights  ? 08/05/21 0500 08/06/21 0325 08/07/21 0436  ?Weight: 97.9 kg 97.1 kg 97 kg  ? ? ? ?Data Reviewed:  ? ?CBC: ?Recent Labs  ?Lab 08/01/21 ?1949 08/01/21 ?2001 08/02/21 ?2114 08/03/21 ?6546  08/04/21 ?5035 08/05/21 ?4656 08/06/21 ?8127 08/08/21 ?5170  ?WBC 11.0*   < > 8.7 7.3 6.5 7.5 7.9 8.9  ?NEUTROABS 6.9  --  5.8  --   --   --   --   --   ?HGB 13.3   < > 11.0* 10.5* 9.3* 9.7* 9.8* 10.2*  ?HCT 38.8*   < > 32.2* 31.3* 28.2* 30.3* 29.3* 31.2*  ?MCV 85.1   < > 85.4 86.2 87.0 88.6 86.4 87.2  ?PLT 92*   < > 82* 77* 81* 97* 127* 190  ? < > = values in this interval not displayed.  ? ?Basic Metabolic Panel: ?Recent Labs  ?Lab 08/02/21 ?2114 08/03/21 ?0332 08/04/21 ?0248 08/05/21 ?0326 08/06/21 ?0174 08/08/21 ?9449  ?NA 137 138 139 141 142 141  ?K 4.2 4.2 4.1 3.8 4.1 4.1  ?CL 109 109 113* 113* 114* 107  ?CO2 17* 17* 16* 19* 19* 27  ?GLUCOSE 73 79 100* 91 77 98  ?BUN 55* 54* 52* 48* 46* 47*  ?CREATININE 3.25* 3.24* 2.96* 2.83* 2.79* 2.62*  ?CALCIUM 7.7* 7.7* 7.7* 7.9* 8.0* 8.1*  ?MG 2.1 2.2 2.3  --  2.3 2.1  ?PHOS 4.2 4.4 4.0  --   --   --   ? ?GFR: ?Estimated Creatinine Clearance: 28.8 mL/min (A) (by C-G formula based on SCr of 2.62 mg/dL (H)). ?Liver Function Tests: ?Recent Labs  ?Lab 08/01/21 ?1949 08/02/21 ?6759 08/04/21 ?1638 08/05/21 ?0326  ?AST 38 35 29 29  ?ALT 63* 49* 11 11  ?ALKPHOS 46 65 99 35  ?BILITOT 3.0* 2.8* 3.9* 3.7*  ?PROT 5.2* 5.3* 5.3* 5.6*  ?ALBUMIN 1.9* 2.1* 1.7* 1.8*  ? ?No results for input(s): LIPASE, AMYLASE in the last 168 hours. ?No results for input(s): AMMONIA in the last 168 hours. ?Coagulation Profile: ?Recent Labs  ?Lab 08/01/21 ?1149 08/01/21 ?1532  ?INR 1.6* 1.7*  ? ?Cardiac Enzymes: ?No results for input(s): CKTOTAL, CKMB, CKMBINDEX, TROPONINI in the last 168 hours. ?BNP (last 3 results) ?No results for input(s): PROBNP in the last 8760 hours. ?HbA1C: ?No results for input(s): HGBA1C in the last 72 hours. ?CBG: ?Recent Labs  ?Lab 08/07/21 ?7017 08/07/21 ?1200 08/07/21 ?1649 08/07/21 ?2115 08/08/21 ?7939  ?GLUCAP 111* 96 82 84 93  ? ?Lipid Profile: ?No results for input(s): CHOL, HDL, LDLCALC, TRIG, CHOLHDL, LDLDIRECT in the last 72 hours. ?Thyroid Function Tests: ?No results  for input(s): TSH, T4TOTAL, FREET4, T3FREE, THYROIDAB in the last 72 hours. ?Anemia Panel: ?No results for input(s): VITAMINB12, FOLATE, FERRITIN, TIBC, IRON, RETICCTPCT in the last 72 hours. ?Sepsis Labs: ?Recent Labs  ?Lab 08/01/21 ?1742 08/02/21 ?0300 08/02/21 ?9233 08/05/21 ?0440  ?LATICACIDVEN 1.8 1.5 1.4 0.7  ? ? ?No results found for this or any previous visit (from the past 240 hour(s)).  ? ? ? ? ? ?Radiology Studies: ?No results found. ? ? ? ? ? ?Scheduled Meds: ? sodium chloride   Intravenous Once  ? bisacodyl  5 mg Oral Once  ? bisacodyl  10 mg Rectal Daily  ? docusate sodium  100 mg Oral BID  ?  folic acid  1 mg Oral Daily  ? heparin  5,000 Units Subcutaneous Q8H  ? insulin aspart  0-15 Units Subcutaneous TID AC & HS  ? mouth rinse  15 mL Mouth Rinse BID  ? multivitamin with minerals  1 tablet Oral Daily  ? pantoprazole (PROTONIX) IV  40 mg Intravenous Daily  ? polyethylene glycol  17 g Oral Daily  ? sodium bicarbonate  1,300 mg Oral TID  ? sodium chloride flush  10-40 mL Intracatheter Q12H  ? thiamine  100 mg Oral Daily  ? Or  ? thiamine  100 mg Intravenous Daily  ? ?Continuous Infusions: ? sodium chloride    ? magnesium sulfate bolus IVPB    ? ? ? LOS: 12 days  ? ?Time spent= 35 mins ? ? ? ?Quirino Kakos Arsenio Loader, MD ?[facility] ? ?If 7PM-7AM, please contact night-coverage ? ?08/08/2021, 8:19 AM  ? ?

## 2021-08-08 NOTE — Progress Notes (Signed)
Occupational Therapy Treatment ?Patient Details ?Name: Edward Hopkins ?MRN: [id number] ?DOB: 29-Mar-1951 ?Today's Date: 08/08/2021 ? ? ?History of present illness Pt is a 71 y.o. male who presented 07/27/21 with chest and back pain. Pt found to have hypertensive emergency with recurrent acute aortic type B dissection. S/p emergent repair of a ruptured descending thoracic dissection including extension of the dissection into the visceral segment with hemorrhagic shock 3/20. Extubated 3/23. PMH: HTN, Hep C, polysubstance abuse including ongoing cocaine use, Aortic dissection s/p thoracic aortic graft repair, thoracic aortic aneurysm, CHF, DVT, osteomyelitis, endocarditis ?  ?OT comments ? Pt progressing towards OT goals. Eager for therapy and pleasant/cooperative "I will do whatever y'all need me to do". Pt min to max A today with transfers from elevated bed and recliner. Pt's cognition remains impaired and he needed verbal cues for initiation, safety, sequencing, allowing time for processing. Pt max A for LB ADL at this time (can participate in knee up) requires supported sitting min to max A due to posterior lean for participation in grooming. Pt will benefit from continued OT in the acute setting as well as afterwards at the AIR level to maximize safety and independence in ADL and functional transfers and return to PLOF.  ? ?Recommendations for follow up therapy are one component of a multi-disciplinary discharge planning process, led by the attending physician.  Recommendations may be updated based on patient status, additional functional criteria and insurance authorization. ?   ?Follow Up Recommendations ? Acute inpatient rehab (3hours/day)  ?  ?Assistance Recommended at Discharge Frequent or constant Supervision/Assistance  ?Patient can return home with the following ? A lot of help with walking and/or transfers;A lot of help with bathing/dressing/bathroom;Assistance with cooking/housework;Assistance with feeding ?   ?Equipment Recommendations ? BSC/3in1;Wheelchair (measurements OT)  ?  ?Recommendations for Other Services Rehab consult ? ?  ?Precautions / Restrictions Precautions ?Precautions: Fall;Other (comment) ?Precaution Comments: watch SpO2 ?Restrictions ?Weight Bearing Restrictions: No  ? ? ?  ? ?Mobility Bed Mobility ?Overal bed mobility: Needs Assistance ?Bed Mobility: Rolling, Sidelying to Sit ?Rolling: Mod assist ?Sidelying to sit: Max assist, HOB elevated, +2 for physical assistance ?  ?  ?  ?General bed mobility comments: Step by step cues for sequencing, assist with LEs and to roll onto right side and reach for rail, assist with trunk., increased time. ?  ? ?Transfers ?Overall transfer level: Needs assistance ?Equipment used: Rolling walker (2 wheels) ?Transfers: Sit to/from Stand, Bed to chair/wheelchair/BSC ?Sit to Stand: Min assist, Mod assist, Max assist, +2 physical assistance ?  ?  ?Step pivot transfers: Min assist, +2 physical assistance ?  ?  ?General transfer comment: Stood from EOB x2 requiring Min-Mod A with cues for technique/hand placement. Pt with tendency to scoot too far forward on EOB, needs manual placement of LLE as pt with narrow BoS. More assist needed to stand from chair with Max A of 2. Able to step pivot transfer to chair with Min A of 2 and use of RW. ?  ?  ?Balance Overall balance assessment: Needs assistance ?Sitting-balance support: Feet supported, Bilateral upper extremity supported ?Sitting balance-Leahy Scale: Poor ?Sitting balance - Comments: Pt with posterior lean needing MOd A progressing to Min A to sit EOB, tendency to sit on very edge of bed. ?Postural control: Posterior lean ?Standing balance support: During functional activity, Reliant on assistive device for balance ?Standing balance-Leahy Scale: Poor ?Standing balance comment: MinA to modA for support; modA overall to assist with movement ?  ?  ?  ?  ?  ?  ?  ?  ?  ?  ?  ?   ? ?  ADL either performed or assessed with clinical  judgement  ? ?ADL Overall ADL's : Needs assistance/impaired ?Eating/Feeding: Minimal assistance;Sitting ?Eating/Feeding Details (indicate cue type and reason): ablet bring hand to face, had been feeding himself when therapy entered the room ?Grooming: Wash/dry face;Min guard;Sitting ?  ?Upper Body Bathing: Moderate assistance;Sitting ?Upper Body Bathing Details (indicate cue type and reason): for back ?  ?  ?Upper Body Dressing : Moderate assistance ?Upper Body Dressing Details (indicate cue type and reason): to don extra gown ?Lower Body Dressing: Maximal assistance;Sitting/lateral leans ?Lower Body Dressing Details (indicate cue type and reason): to don socks EOB ?Toilet Transfer: Moderate assistance;+2 for physical assistance;+2 for safety/equipment;Rolling walker (2 wheels);Stand-pivot ?Toilet Transfer Details (indicate cue type and reason): assist for transfer from elevated bed to recliner ?Toileting- Clothing Manipulation and Hygiene: Total assistance;Sitting/lateral lean;Sit to/from stand;Cueing for safety;Cueing for sequencing ?Toileting - Clothing Manipulation Details (indicate cue type and reason): pimo fit in use ?  ?  ?Functional mobility during ADLs: Moderate assistance;+2 for physical assistance;+2 for safety/equipment;Rolling walker (2 wheels) ?General ADL Comments: Pt limited by decreased cognition, decreased activity tolererance and decreased ability to care for self. ?  ? ?Extremity/Trunk Assessment   ?  ?  ?  ?  ?  ? ?Vision   ?Vision Assessment?: No apparent visual deficits ?Additional Comments: glasses in room and used during session ?  ?Perception   ?  ?Praxis   ?  ? ?Cognition Arousal/Alertness: Awake/alert ?Behavior During Therapy: [facility] for tasks assessed/performed ?Overall Cognitive Status: Impaired/Different from baseline ?Area of Impairment: Following commands, Problem solving, Memory, Attention ?  ?  ?  ?  ?  ?  ?  ?  ?Orientation Level: Disoriented to, Time ?Current Attention Level:  Sustained, Selective ?Memory: Decreased short-term memory ?Following Commands: Follows one step commands with increased time, Follows multi-step commands inconsistently ?Safety/Judgement: Decreased awareness of safety, Decreased awareness of deficits ?Awareness: Intellectual ?Problem Solving: Slow processing, Difficulty sequencing, Requires verbal cues, Decreased initiation, Requires tactile cues ?General Comments: Decreased initiation, requires step by step cues for sequencing/technique and repetition. Decreased processing. masks deficits with humor. Seems improved from prior session. ?  ?  ?   ?Exercises   ? ?  ?Shoulder Instructions   ? ? ?  ?General Comments Difficult to get good SpO2 reading- tried multiple sensors and locations - poor pleth  ? ? ?Pertinent Vitals/ Pain       Pain Assessment ?Pain Assessment: Faces ?Faces Pain Scale: Hurts even more ?Pain Location: generalized ?Pain Descriptors / Indicators: Grimacing, Operative site guarding ?Pain Intervention(s): Monitored during session, Repositioned, Other (comment) (extra pillow under bottom in recliner) ? ?Home Living   ?  ?  ?  ?  ?  ?  ?  ?  ?  ?  ?  ?  ?  ?  ?  ?  ?  ?  ? ?  ?Prior Functioning/Environment    ?  ?  ?  ?   ? ?Frequency ? Min 2X/week  ? ? ? ? ?  ?Progress Toward Goals ? ?OT Goals(current goals can now be found in the care plan section) ? Progress towards OT goals: Progressing toward goals ? ?Acute Rehab OT Goals ?Patient Stated Goal: get better ?OT Goal Formulation: With patient ?Time For Goal Achievement: 08/20/21 ?Potential to Achieve Goals: Good ?ADL Goals ?Pt Will Perform Grooming: with set-up;sitting ?Pt Will Transfer to Toilet: with min assist;ambulating;bedside commode ?Additional ADL Goal #1: Pt will increase to minA for UB ADL and modA for  LB ADL to increase independence.  ?Plan Discharge plan remains appropriate;Frequency remains appropriate   ? ?Co-evaluation ? ? ?   ?  ?PT goals addressed during session: Mobility/safety with  mobility;Balance;Proper use of DME;Strengthening/ROM ?  ?  ? ?  ?AM-PAC OT "6 Clicks" Daily Activity     ?Outcome Measure ? ? Help from another person eating meals?: A Little ?Help from another person [place]

## 2021-08-08 NOTE — Progress Notes (Signed)
Subjective:  3300 of UOP-  crt cont to trend down slowly -  working with PT ?Objective ?Vital signs in last 24 hours: ?Vitals:  ? 08/07/21 1900 08/08/21 0000 08/08/21 0420 08/08/21 0801  ?BP: 99/65 105/65 (!) 152/85 112/72  ?Pulse: 76 76 92 84  ?Resp: 20 17 20 19   ?Temp: 98.1 ?F (36.7 ?C) 98 ?F (36.7 ?C) 98.3 ?F (36.8 ?C) 98 ?F (36.7 ?C)  ?TempSrc: Oral Oral Oral Oral  ?SpO2: 98% 96% 94% 92%  ?Weight:      ?Height:      ? ?Weight change:  ? ?Intake/Output Summary (Last 24 hours) at 08/08/2021 4742 ?Last data filed at 08/08/2021 5956 ?Gross per 24 hour  ?Intake --  ?Output 3585 ml  ?Net -3585 ml  ? ? ?Assessment/ Plan: ?Pt is a 71 y.o. yo male who was admitted on 07/27/2021 with needing emergent repair of descending thoracic dissection involving right renal artery ?Assessment/Plan: ?1. Renal-  creatinine upon admission 1.27.  A on CRF in the setting of descending thoracic dissection with renal artery involvement-  emergent repair and hemorrhagic shock.  Did not require dialysis.  -  crt peaked at 3.25-  now has shown a steady improvement over the last week- crt 2.6 today.  Non oliguric.  Initially no flow to right kidney -  will need to see how much it is able to improve ?2. HTN/volume-  volume overloaded-  had some more aggressive diuresis over the weekend- now will be on lasix 60 daily iv  ?3. Anemia-  nadir of 9.3 but improving slowly -  I dont see any ESA-  no need right now hgb 10.2 ?4. Elytes-  K of 4.1 on PRN repletion-  Mag of 2.1 ?5. Metabolic acidosis-  bicarb up today -  will lower oral bicarb dosing  ? ?Louis Meckel  ? ? ?Labs: ?Basic Metabolic Panel: ?Recent Labs  ?Lab 08/02/21 ?2114 08/03/21 ?0332 08/04/21 ?0248 08/05/21 ?0326 08/06/21 ?3875 08/08/21 ?6433  ?NA 137 138 139 141 142 141  ?K 4.2 4.2 4.1 3.8 4.1 4.1  ?CL 109 109 113* 113* 114* 107  ?CO2 17* 17* 16* 19* 19* 27  ?GLUCOSE 73 79 100* 91 77 98  ?BUN 55* 54* 52* 48* 46* 47*  ?CREATININE 3.25* 3.24* 2.96* 2.83* 2.79* 2.62*  ?CALCIUM 7.7*  7.7* 7.7* 7.9* 8.0* 8.1*  ?PHOS 4.2 4.4 4.0  --   --   --   ? ?Liver Function Tests: ?Recent Labs  ?Lab 08/02/21 ?2951 08/04/21 ?8841 08/05/21 ?0326  ?AST 35 29 29  ?ALT 49* 11 11  ?ALKPHOS 66 06 30  ?BILITOT 2.8* 3.9* 3.7*  ?PROT 5.3* 5.3* 5.6*  ?ALBUMIN 2.1* 1.7* 1.8*  ? ?No results for input(s): LIPASE, AMYLASE in the last 168 hours. ?No results for input(s): AMMONIA in the last 168 hours. ?CBC: ?Recent Labs  ?Lab 08/01/21 ?1949 08/01/21 ?2001 08/02/21 ?2114 08/03/21 ?1601 08/04/21 ?0932 08/05/21 ?3557 08/06/21 ?3220 08/08/21 ?2542  ?WBC 11.0*   < > 8.7 7.3 6.5 7.5 7.9 8.9  ?NEUTROABS 6.9  --  5.8  --   --   --   --   --   ?HGB 13.3   < > 11.0* 10.5* 9.3* 9.7* 9.8* 10.2*  ?HCT 38.8*   < > 32.2* 31.3* 28.2* 30.3* 29.3* 31.2*  ?MCV 85.1   < > 85.4 86.2 87.0 88.6 86.4 87.2  ?PLT 92*   < > 82* 77* 81* 97* 127* 190  ? < > = values  in this interval not displayed.  ? ?Cardiac Enzymes: ?No results for input(s): CKTOTAL, CKMB, CKMBINDEX, TROPONINI in the last 168 hours. ?CBG: ?Recent Labs  ?Lab 08/07/21 ?6578 08/07/21 ?1200 08/07/21 ?1649 08/07/21 ?2115 08/08/21 ?4696  ?GLUCAP 111* 96 82 84 93  ? ? ?Iron Studies: No results for input(s): IRON, TIBC, TRANSFERRIN, FERRITIN in the last 72 hours. ?Studies/Results: ?DG Chest Port 1V same Day ? ?Result Date: 08/08/2021 ?CLINICAL DATA:  Postop EXAM: PORTABLE CHEST 1 VIEW COMPARISON:  08/05/2021; 08/02/2021 FINDINGS: Grossly unchanged cardiac silhouette and mediastinal contours post median sternotomy. Stable sequela of endovascular repair of the descending thoracic aorta. Interval removal of right jugular approach central venous catheter. Improved aeration of the lungs with persistent left basilar heterogeneous/consolidative opacities. No new focal airspace opacities. No evidence of edema. No acute osseous abnormalities. Moderate degenerative change of the left glenohumeral joint is suspected though incompletely evaluated. Surgical clips overlie the right axilla. IMPRESSION:  Improved aeration of the lungs with persistent left basilar opacities, likely atelectasis. Electronically Signed   By: Sandi Mariscal M.D.   On: 08/08/2021 08:46   ?Medications: ?Infusions: ? sodium chloride    ? albumin human    ? magnesium sulfate bolus IVPB    ? ? ?Scheduled Medications: ? sodium chloride   Intravenous Once  ? bisacodyl  5 mg Oral Once  ? bisacodyl  10 mg Rectal Daily  ? docusate sodium  100 mg Oral BID  ? folic acid  1 mg Oral Daily  ? furosemide  60 mg Intravenous Once  ? heparin  5,000 Units Subcutaneous Q8H  ? insulin aspart  0-15 Units Subcutaneous TID AC & HS  ? mouth rinse  15 mL Mouth Rinse BID  ? multivitamin with minerals  1 tablet Oral Daily  ? pantoprazole (PROTONIX) IV  40 mg Intravenous Daily  ? polyethylene glycol  17 g Oral Daily  ? sodium bicarbonate  1,300 mg Oral TID  ? sodium chloride flush  10-40 mL Intracatheter Q12H  ? thiamine  100 mg Oral Daily  ? Or  ? thiamine  100 mg Intravenous Daily  ? ? have reviewed scheduled and prn medications. ? ?Physical Exam: ?General: up with PT- a little slow mentally but dont know baseline ?Heart: RRR ?Lungs: dec BS at the bases ?Abdomen: still a little distended ?Extremities: pitting edema but only to knees  ? ? ? ?08/08/2021,9:27 AM ? LOS: 12 days  ? ?  ? ? ? ? ?

## 2021-08-08 NOTE — Progress Notes (Signed)
Physical Therapy Treatment ?Patient Details ?Name: Edward Hopkins ?MRN: [id number] ?DOB: 19-Nov-1950 ?Today's Date: 08/08/2021 ? ? ?History of Present Illness Pt is a 71 y.o. male who presented 07/27/21 with chest and back pain. Pt found to have hypertensive emergency with recurrent acute aortic type B dissection. S/p emergent repair of a ruptured descending thoracic dissection including extension of the dissection into the visceral segment with hemorrhagic shock 3/20. Extubated 3/23. PMH: HTN, Hep C, polysubstance abuse including ongoing cocaine use, Aortic dissection s/p thoracic aortic graft repair, thoracic aortic aneurysm, CHF, DVT, osteomyelitis, endocarditis ? ?  ?PT Comments  ? ? Patient progressing well towards PT goals. Session focused on functional transfers and initiation of gait training. Requires Min-max A of 2 to stand from different height surfaces. Able to walk a few feet with use of RW and close chair follow. Requires step by step cues for techniques/sequencing with increased time and repetition to follow commands. Cognition seems improved from prior session. Increased time to perform all mobility. Not able to get Sp02 reading during mobility due to poor pleth so donned oxygen at end of session. Continues to be appropriate for AIR. Will follow. ?  ?Recommendations for follow up therapy are one component of a multi-disciplinary discharge planning process, led by the attending physician.  Recommendations may be updated based on patient status, additional functional criteria and insurance authorization. ? ?Follow Up Recommendations ? Acute inpatient rehab (3hours/day) ?  ?  ?Assistance Recommended at Discharge Frequent or constant Supervision/Assistance  ?Patient can return home with the following   ?  ?Equipment Recommendations ? Rolling walker (2 wheels);BSC/3in1;Wheelchair (measurements PT);Wheelchair cushion (measurements PT);Hospital bed;Other (comment)  ?  ?Recommendations for Other Services   ? ? ?   ?Precautions / Restrictions Precautions ?Precautions: Fall;Other (comment) ?Precaution Comments: watch SpO2 ?Restrictions ?Weight Bearing Restrictions: No  ?  ? ?Mobility ? Bed Mobility ?Overal bed mobility: Needs Assistance ?Bed Mobility: Rolling, Sidelying to Sit ?Rolling: Mod assist ?Sidelying to sit: Max assist, HOB elevated, +2 for physical assistance ?  ?  ?  ?General bed mobility comments: Step by step cues for sequencing, assist with LEs and to roll onto right side and reach for rail, assist with trunk., increased time. ?  ? ?Transfers ?Overall transfer level: Needs assistance ?Equipment used: Rolling walker (2 wheels) ?Transfers: Sit to/from Stand, Bed to chair/wheelchair/BSC ?Sit to Stand: Min assist, Mod assist, Max assist ?  ?Step pivot transfers: Min assist, +2 physical assistance ?  ?  ?  ?General transfer comment: Stood from EOB x2 requiring Min-Mod A with cues for technique/hand placement. Pt with tendency to scoot too far forward on EOB, needs manual placement of LLE as pt with narrow BoS. More assist needed to stand from chair with Max A of 2. Able to step pivot transfer to chair with Min A of 2 and use of RW. ?  ? ?Ambulation/Gait ?Ambulation/Gait assistance: Min assist, +2 safety/equipment ?Gait Distance (Feet): 6 Feet ?Assistive device: Rolling walker (2 wheels) ?Gait Pattern/deviations: Decreased step length - right, Decreased step length - left, Shuffle, Narrow base of support, Step-through pattern ?Gait velocity: decreased ?  ?  ?General Gait Details: Slow, shuffling gait with heavy reliance on UEs Able to advance RLE today. Fatigues. ? ? ?Stairs ?  ?  ?  ?  ?  ? ? ?Wheelchair Mobility ?  ? ?Modified Rankin (Stroke Patients Only) ?Modified Rankin (Stroke Patients Only) ?Pre-Morbid Rankin Score: Slight disability ?Modified Rankin: Moderately severe disability ? ? ?  ?Balance   ?  Sitting-balance support: Feet supported, Bilateral upper extremity supported ?Sitting balance-Leahy Scale:  Poor ?Sitting balance - Comments: Pt with posterior lean needing MOd A progressing to Min A to sit EOB, tendency to sit on very edge of bed. ?Postural control: Posterior lean ?Standing balance support: During functional activity, Reliant on assistive device for balance ?Standing balance-Leahy Scale: Poor ?  ?  ?  ?  ?  ?  ?  ?  ?  ?  ?  ?  ?  ? ?  ?Cognition Arousal/Alertness: Awake/alert ?Behavior During Therapy: [facility] for tasks assessed/performed ?Overall Cognitive Status: Impaired/Different from baseline ?Area of Impairment: Following commands, Problem solving, Memory, Attention ?  ?  ?  ?  ?  ?  ?  ?  ?  ?Current Attention Level: Sustained, Selective ?Memory: Decreased short-term memory ?Following Commands: Follows one step commands with increased time, Follows multi-step commands inconsistently ?  ?  ?Problem Solving: Slow processing, Difficulty sequencing, Requires verbal cues, Decreased initiation, Requires tactile cues ?General Comments: Decreased initiation, requires step by step cues for sequencing/technique and repetition. Decreased processing. masks deficits with humor. Seems improved from prior session. ?  ?  ? ?  ?Exercises   ? ?  ?General Comments General comments (skin integrity, edema, etc.): Difficult to get SP02 reading due to poor pleth. ?  ?  ? ?Pertinent Vitals/Pain Pain Assessment ?Pain Assessment: Faces ?Faces Pain Scale: Hurts even more ?Pain Location: generalized ?Pain Descriptors / Indicators: Grimacing, Operative site guarding ?Pain Intervention(s): Monitored during session, Repositioned  ? ? ?Home Living   ?  ?  ?  ?  ?  ?  ?  ?  ?  ?   ?  ?Prior Function    ?  ?  ?   ? ?PT Goals (current goals can now be found in the care plan section) Progress towards PT goals: Progressing toward goals ? ?  ?Frequency ? ? ? Min 4X/week ? ? ? ?  ?PT Plan Current plan remains appropriate  ? ? ?Co-evaluation PT/OT/SLP Co-Evaluation/Treatment: Yes ?  ?PT goals addressed during session: Mobility/safety with  mobility;Strengthening/ROM;Balance;Proper use of DME ?  ?  ? ?  ?AM-PAC PT "6 Clicks" Mobility   ?Outcome Measure ? Help needed turning from your back to your side while in a flat bed without using bedrails?: A Lot ?Help needed moving from lying on your back to sitting on the side of a flat bed without using bedrails?: A Lot ?Help needed moving to and from a bed to a chair (including a wheelchair)?: A Lot ?Help needed standing up from a chair using your arms (e.g., wheelchair or bedside chair)?: Total ?Help needed to walk in hospital room?: A Little ?Help needed climbing 3-5 steps with a railing? : Total ?6 Click Score: 11 ? ?  ?End of Session Equipment Utilized During Treatment: Gait belt ?Activity Tolerance: Patient tolerated treatment well ?Patient left: in chair;with call bell/phone within reach (no chair alarm pads) ?Nurse Communication: Mobility status;Need for lift equipment (stedy) ?PT Visit Diagnosis: Unsteadiness on feet (R26.81);Muscle weakness (generalized) (M62.81);History of falling (Z91.81);Difficulty in walking, not elsewhere classified (R26.2);Other abnormalities of gait and mobility (R26.89) ?  ? ? ?Time: 7591-6384 ?PT Time Calculation (min) (ACUTE ONLY): 31 min ? ?Charges:  $Therapeutic Activity: 8-22 mins          ?          ? ?Marisa Severin, PT, DPT ?Acute Rehabilitation Services ?Pager [phone] ?Office [phone] ? ? ? ? ? ?[place] ?  08/08/2021, 11:22 AM ? ?

## 2021-08-08 NOTE — Progress Notes (Signed)
Inpatient Rehab Admissions Coordinator:  ? ?I do not have a bed for this pt. On CIR today. I will submit case for insurance auth today once OT has completed their treatment and note.  ? ?Clemens Catholic, MS, CCC-SLP ?Rehab Admissions Coordinator  ?[phone] (celll) ?[phone] (office) ? ?

## 2021-08-08 NOTE — Progress Notes (Addendum)
[facility] [place] ? ?Subjective  - Doing better, feels better. ? ? ?Objective ?(!) 152/85 ?92 ?98.3 ?F (36.8 ?C) (Oral) ?20 ?94% ? ?Intake/Output Summary (Last 24 hours) at 08/08/2021 0752 ?Last data filed at 08/08/2021 9833 ?Gross per 24 hour  ?Intake --  ?Output 3585 ml  ?Net -3585 ml  ? ? ?Abdomin distended with + BS. Tol PO's ?Feet warm with palpable DP pulses B, motor intact ?Right groin soft, healing well ?Lungs RA 94 % SAT ? ?Assessment/Planning: ?POD # 7  ?71 y.o. male is s/p:  ?emergent repair of a ruptured descending thoracic dissection including extension of the dissection into the visceral segment with hemorrhagic shock  ? ? ?Good inflow with palpable DP pulses ?Chest x ray order was placed.  It was not completed yesterday but is pending today.  RN just released this am. ? Afebrile without leukocytosis on 1.2 L  with non labored breathing. ?Heparin SQ for DVT prophylaxis  ?Possible CIR candidate  ? ?Roxy Horseman ?08/08/2021 ?7:52 AM ?-- ? ?Laboratory ?Lab Results: ?Recent Labs  ?  08/06/21 ?8250 08/08/21 ?5397  ?WBC 7.9 8.9  ?HGB 9.8* 10.2*  ?HCT 29.3* 31.2*  ?PLT 127* 190  ? ?BMET ?Recent Labs  ?  08/06/21 ?6734 08/08/21 ?1937  ?NA 142 141  ?K 4.1 4.1  ?CL 114* 107  ?CO2 19* 27  ?GLUCOSE 77 98  ?BUN 46* 47*  ?CREATININE 2.79* 2.62*  ?CALCIUM 8.0* 8.1*  ? ? ?COAG ?Lab Results  ?Component Value Date  ? INR 1.7 (H) 08/01/2021  ? INR 1.6 (H) 08/01/2021  ? INR 1.1 07/27/2021  ? ?No results found for: PTT ? ?I have seen and evaluated the patient. I agree with the PA note as documented above.  Status post repair of ruptured descending thoracic dissection last Monday.  Has done well through the weekend.  Continues to have palpable pedal pulses.  Had developed an ileus end of last week that seems to be improving and abdomen is softer having multiple bowel movements overnight.  Creatinine continues to improve to 2.62 with AKI in the setting of CKD.  White blood cell stable at 8.9.   Hemoglobin stable 10.2.  We will likely delay repeat CTA until 1 month postop at follow-up in the office given AKI on CKD that is improving and give him the best chance for renal recovery. ? ?Marty Heck, MD ?[facility] ?Office: [phone] ? ? ?

## 2021-08-09 ENCOUNTER — Encounter: Payer: Medicare Other | Admitting: Internal Medicine

## 2021-08-09 DIAGNOSIS — I71019 Dissection of thoracic aorta, unspecified: Secondary | ICD-10-CM | POA: Diagnosis not present

## 2021-08-09 DIAGNOSIS — R579 Shock, unspecified: Secondary | ICD-10-CM | POA: Diagnosis not present

## 2021-08-09 DIAGNOSIS — N179 Acute kidney failure, unspecified: Secondary | ICD-10-CM | POA: Diagnosis not present

## 2021-08-09 LAB — CBC
HCT: 28.5 % — ABNORMAL LOW (ref 39.0–52.0)
Hemoglobin: 9.1 g/dL — ABNORMAL LOW (ref 13.0–17.0)
MCH: 27.9 pg (ref 26.0–34.0)
MCHC: 31.9 g/dL (ref 30.0–36.0)
MCV: 87.4 fL (ref 80.0–100.0)
Platelets: UNDETERMINED 10*3/uL (ref 150–400)
RBC: 3.26 MIL/uL — ABNORMAL LOW (ref 4.22–5.81)
RDW: 15.8 % — ABNORMAL HIGH (ref 11.5–15.5)
WBC: 7.9 10*3/uL (ref 4.0–10.5)
nRBC: 0 % (ref 0.0–0.2)

## 2021-08-09 LAB — GLUCOSE, CAPILLARY
Glucose-Capillary: 101 mg/dL — ABNORMAL HIGH (ref 70–99)
Glucose-Capillary: 121 mg/dL — ABNORMAL HIGH (ref 70–99)
Glucose-Capillary: 103 mg/dL — ABNORMAL HIGH (ref 70–99)
Glucose-Capillary: 166 mg/dL — ABNORMAL HIGH (ref 70–99)

## 2021-08-09 LAB — BASIC METABOLIC PANEL
Anion gap: 10 (ref 5–15)
BUN: 53 mg/dL — ABNORMAL HIGH (ref 8–23)
CO2: 28 mmol/L (ref 22–32)
Calcium: 8.3 mg/dL — ABNORMAL LOW (ref 8.9–10.3)
Chloride: 103 mmol/L (ref 98–111)
Creatinine, Ser: 2.59 mg/dL — ABNORMAL HIGH (ref 0.61–1.24)
GFR, Estimated: 25 mL/min — ABNORMAL LOW (ref >60)
Glucose, Bld: 98 mg/dL (ref 70–99)
Potassium: 4.2 mmol/L (ref 3.5–5.1)
Sodium: 141 mmol/L (ref 135–145)

## 2021-08-09 LAB — MAGNESIUM: Magnesium: 2.2 mg/dL (ref 1.7–2.4)

## 2021-08-09 MED ORDER — FUROSEMIDE 10 MG/ML IJ SOLN
60.0000 mg | Freq: Two times a day (BID) | INTRAMUSCULAR | Status: DC
  Administered 2021-08-09: 60 mg via INTRAVENOUS
  Filled 2021-08-09: qty 6

## 2021-08-09 MED ORDER — PANTOPRAZOLE SODIUM 40 MG PO TBEC
40.0000 mg | DELAYED_RELEASE_TABLET | Freq: Every day | ORAL | Status: DC
  Administered 2021-08-10 – 2021-08-11 (×2): 40 mg via ORAL
  Filled 2021-08-09 (×2): qty 1

## 2021-08-09 MED ORDER — DARBEPOETIN ALFA 100 MCG/0.5ML IJ SOSY
100.0000 ug | PREFILLED_SYRINGE | INTRAMUSCULAR | Status: DC
  Administered 2021-08-09: 100 ug via SUBCUTANEOUS
  Filled 2021-08-09 (×2): qty 0.5

## 2021-08-09 MED ORDER — FUROSEMIDE 10 MG/ML IJ SOLN
60.0000 mg | Freq: Every day | INTRAMUSCULAR | Status: DC
  Administered 2021-08-10: 60 mg via INTRAVENOUS
  Filled 2021-08-09: qty 6

## 2021-08-09 NOTE — Progress Notes (Signed)
Occupational Therapy Treatment ?Patient Details ?Name: Edward Hopkins ?MRN: [id number] ?DOB: [birth year] ?Today's Date: 08/09/2021 ? ? ?History of present illness Pt is a 71 y.o. male who presented 07/27/21 with chest and back pain. Pt found to have hypertensive emergency with recurrent acute aortic type B dissection. S/p emergent repair of a ruptured descending thoracic dissection including extension of the dissection into the visceral segment with hemorrhagic shock 3/20. Extubated 3/23. PMH: HTN, Hep C, polysubstance abuse including ongoing cocaine use, Aortic dissection s/p thoracic aortic graft repair, thoracic aortic aneurysm, CHF, DVT, osteomyelitis, endocarditis ?  ?OT comments ? Pt. Seen for skilled PT/OT treatment session. Focus on bed mobility, increasing mobility distance and adl completion.  Pt. Motivated but continues to have slow initiation requiring multiple cues and redirection for task completion.  Remains mod a x2 for all mobility and transfers.  (Refer to PT notes for mobility details).  Remains good candidate for AIR level therapies to promote continued strengthening and safety with mobility and adls.    ? ?Recommendations for follow up therapy are one component of a multi-disciplinary discharge planning process, led by the attending physician.  Recommendations may be updated based on patient status, additional functional criteria and insurance authorization. ?   ?Follow Up Recommendations ? Acute inpatient rehab (3hours/day)  ?  ?Assistance Recommended at Discharge Frequent or constant Supervision/Assistance  ?Patient can return home with the following ? A lot of help with walking and/or transfers;A lot of help with bathing/dressing/bathroom;Assistance with cooking/housework;Assistance with feeding ?  ?Equipment Recommendations ? BSC/3in1;Wheelchair (measurements OT)  ?  ?Recommendations for Other Services Rehab consult ? ?  ?Precautions / Restrictions Precautions ?Precautions: Fall;Other  (comment) ?Precaution Comments: trialed on RA during session per RN recommendations ?Restrictions ?Weight Bearing Restrictions: No  ? ? ?  ? ?Mobility Bed Mobility ?Overal bed mobility: Needs Assistance ?Bed Mobility: Rolling, Sidelying to Sit ?Rolling: Mod assist, +2 for physical assistance ?Sidelying to sit: Max assist, HOB elevated, +2 for physical assistance ?  ?  ?  ?General bed mobility comments: Step by step cues for sequencing, assist with LEs and to roll onto right side and reach for rail, assist with trunk., increased time. ?  ? ?Transfers ?Overall transfer level: Needs assistance ?Equipment used: Rolling walker (2 wheels) ?Transfers: Sit to/from Stand, Bed to chair/wheelchair/BSC ?Sit to Stand: Mod assist, Max assist, +2 physical assistance ?  ?  ?  ?  ?  ?General transfer comment: Stood from EOB requiring Min-Mod A with cues for technique/hand placement. Pt with tendency to scoot too far forward on EOB, needs manual placement of LLE as pt with narrow BoS. ?  ?  ?Balance   ?  ?  ?  ?  ?  ?  ?  ?  ?  ?  ?  ?  ?  ?  ?  ?  ?  ?  ?   ? ?ADL either performed or assessed with clinical judgement  ? ?ADL Overall ADL's : Needs assistance/impaired ?  ?  ?Grooming: Wash/dry face;Sitting;Minimal assistance ?  ?  ?  ?  ?  ?  ?  ?  ?  ?Toilet Transfer: Moderate assistance;+2 for physical assistance;+2 for safety/equipment;Rolling walker (2 wheels) ?Toilet Transfer Details (indicate cue type and reason): placed 3n1 behind pt. after few small steps as pt. reported he thought he was going to have a bowel movement ?Toileting- Clothing Manipulation and Hygiene: Sitting/lateral lean;Sit to/from stand;Cueing for safety;Cueing for sequencing;Maximal assistance ?  ?  ?  ?Functional  mobility during ADLs: Moderate assistance;+2 for physical assistance;+2 for safety/equipment;Rolling walker (2 wheels) ?General ADL Comments: Pt limited by decreased cognition, decreased activity tolererance and decreased ability to care for self.  SLOW inition for all tasks max cues and encouragement ?  ? ?Extremity/Trunk Assessment   ?  ?  ?  ?  ?  ? ?Vision   ?  ?  ?Perception   ?  ?Praxis   ?  ? ?Cognition Arousal/Alertness: Lethargic ?Behavior During Therapy: Flat affect ?  ?Area of Impairment: Following commands, Problem solving, Memory, Attention ?  ?  ?  ?  ?  ?  ?  ?  ?  ?  ?Memory: Decreased short-term memory ?Following Commands: Follows one step commands with increased time, Follows multi-step commands inconsistently ?Safety/Judgement: Decreased awareness of safety, Decreased awareness of deficits ?Awareness: Intellectual ?Problem Solving: Slow processing, Difficulty sequencing, Requires verbal cues, Decreased initiation, Requires tactile cues ?General Comments: Decreased initiation, requires step by step cues for sequencing/technique and repetition. Decreased processing. masks deficits with humor. ?  ?  ?   ?Exercises   ? ?  ?Shoulder Instructions   ? ? ?  ?General Comments    ? ? ?Pertinent Vitals/ Pain       Pain Assessment ?Pain Assessment: No/denies pain ? ?Home Living   ?  ?  ?  ?  ?  ?  ?  ?  ?  ?  ?  ?  ?  ?  ?  ?  ?  ?  ? ?  ?Prior Functioning/Environment    ?  ?  ?  ?   ? ?Frequency ? Min 2X/week  ? ? ? ? ?  ?Progress Toward Goals ? ?OT Goals(current goals can now be found in the care plan section) ? Progress towards OT goals: Progressing toward goals ? ?   ?Plan Discharge plan remains appropriate;Frequency remains appropriate   ? ?Co-evaluation ? ? ? PT/OT/SLP Co-Evaluation/Treatment: Yes ?Reason for Co-Treatment: For patient/therapist safety;To address functional/ADL transfers ?  ?OT goals addressed during session: ADL's and self-care;Proper use of Adaptive equipment and DME ?  ? ?  ?AM-PAC OT "6 Clicks" Daily Activity     ?Outcome Measure ? ? Help from another person eating meals?: A Little ?Help from another person taking care of personal grooming?: A Lot ?Help from another person toileting, which includes using toliet, bedpan, or  urinal?: A Lot ?Help from another person bathing (including washing, rinsing, drying)?: A Lot ?Help from another person to put on and taking off regular upper body clothing?: A Lot ?Help from another person to put on and taking off regular lower body clothing?: A Lot ?6 Click Score: 13 ? ?  ?End of Session Equipment Utilized During Treatment: Rolling walker (2 wheels);Gait belt ? ?OT Visit Diagnosis: Unsteadiness on feet (R26.81);Muscle weakness (generalized) (M62.81);Other symptoms and signs involving cognitive function;Pain ?  ?Activity Tolerance Patient tolerated treatment well ?  ?Patient Left in chair;with call bell/phone within reach;with chair alarm set ?  ?Nurse Communication Other (comment) (rn present during session and states to place pt. on RA to assess need. pt. at 96% during mobility on RA) ?  ? ?   ? ?Time: 3419-6222 ?OT Time Calculation (min): 40 min ? ?Charges: OT General Charges ?$OT Visit: 1 Visit ?OT Treatments ?$Self Care/Home Management : 8-22 mins ? ?Sonia Baller, COTA/L ?Acute Rehabilitation ?[phone]  ? ?Tanya Nones ?08/09/2021, 10:19 AM ?

## 2021-08-09 NOTE — Progress Notes (Signed)
Subjective:  2400 of UOP-  crt cont to trend down slowly - working with PT again    ?Objective ?Vital signs in last 24 hours: ?Vitals:  ? 08/08/21 2315 08/09/21 0355 08/09/21 0506 08/09/21 0821  ?BP: 127/82 112/79  113/80  ?Pulse: 88 89  81  ?Resp: 20 20  15   ?Temp:  99.1 ?F (37.3 ?C)  98.4 ?F (36.9 ?C)  ?TempSrc:  Oral  Oral  ?SpO2: 96% 96%  97%  ?Weight:   97.1 kg   ?Height:      ? ?Weight change:  ? ?Intake/Output Summary (Last 24 hours) at 08/09/2021 0945 ?Last data filed at 08/09/2021 5093 ?Gross per 24 hour  ?Intake 240 ml  ?Output 2425 ml  ?Net -2185 ml  ? ? ?Assessment/ Plan: ?Pt is a 71 y.o. yo male who was admitted on 07/27/2021 with needing emergent repair of descending thoracic dissection involving right renal artery ?Assessment/Plan: ?1. Renal-  creatinine upon admission 1.27.  A on CRF in the setting of descending thoracic dissection with renal artery involvement-  emergent repair and hemorrhagic shock.  Did not require dialysis.  -  crt peaked at 3.25-  now has shown a steady improvement over the last week- crt 2.59 today.  Non oliguric.  Initially no flow to right kidney -  will need to see how much it is able to improve.  Not uremic ?2. HTN/volume-  volume overloaded-  had some more aggressive diuresis over the weekend- now will be on lasix 60 daily iv -  back on BID -  edema is improved-  would like to keep on daily with BUN rising-  actually might be able to stop soon  ?3. Anemia-  nadir of 9.3 but improving slowly -  I dont see any ESA-  will start for hgb of 9.1 ?4. [place]-  K of 4.2 on PRN repletion-  Mag of 2.1 ?5. Metabolic acidosis-  bicarb up -lowered oral bicarb dosing on 3/27 -  may be able to stop this as well if bicarb stays up ? ?Louis Meckel  ? ? ?Labs: ?Basic Metabolic Panel: ?Recent Labs  ?Lab 08/02/21 ?2114 08/03/21 ?0332 08/04/21 ?0248 08/05/21 ?0326 08/06/21 ?2671 08/08/21 ?2458 08/09/21 ?0222  ?NA 137 138 139   < > 142 141 141  ?K 4.2 4.2 4.1   < > 4.1 4.1 4.2  ?CL 109 109  113*   < > 114* 107 103  ?CO2 17* 17* 16*   < > 19* 27 28  ?GLUCOSE 73 79 100*   < > 77 98 98  ?BUN 55* 54* 52*   < > 46* 47* 53*  ?CREATININE 3.25* 3.24* 2.96*   < > 2.79* 2.62* 2.59*  ?CALCIUM 7.7* 7.7* 7.7*   < > 8.0* 8.1* 8.3*  ?PHOS 4.2 4.4 4.0  --   --   --   --   ? < > = values in this interval not displayed.  ? ?Liver Function Tests: ?Recent Labs  ?Lab 08/04/21 ?0248 08/05/21 ?0326  ?AST 29 29  ?ALT 11 11  ?ALKPHOS 51 56  ?BILITOT 3.9* 3.7*  ?PROT 5.3* 5.6*  ?ALBUMIN 1.7* 1.8*  ? ?No results for input(s): LIPASE, AMYLASE in the last 168 hours. ?No results for input(s): AMMONIA in the last 168 hours. ?CBC: ?Recent Labs  ?Lab 08/02/21 ?2114 08/03/21 ?0332 08/04/21 ?0248 08/05/21 ?0326 08/06/21 ?0998 08/08/21 ?3382 08/09/21 ?0222  ?WBC 8.7   < > 6.5 7.5 7.9 8.9 7.9  ?NEUTROABS 5.8  --   --   --   --   --   --   ?  HGB 11.0*   < > 9.3* 9.7* 9.8* 10.2* 9.1*  ?HCT 32.2*   < > 28.2* 30.3* 29.3* 31.2* 28.5*  ?MCV 85.4   < > 87.0 88.6 86.4 87.2 87.4  ?PLT 82*   < > 81* 97* 127* 190 PLATELET CLUMPS NOTED ON SMEAR, UNABLE TO ESTIMATE  ? < > = values in this interval not displayed.  ? ?Cardiac Enzymes: ?No results for input(s): CKTOTAL, CKMB, CKMBINDEX, TROPONINI in the last 168 hours. ?CBG: ?Recent Labs  ?Lab 08/08/21 ?9485 08/08/21 ?1115 08/08/21 ?1633 08/08/21 ?2139 08/09/21 ?0604  ?GLUCAP 93 93 102* 73 101*  ? ? ?Iron Studies: No results for input(s): IRON, TIBC, TRANSFERRIN, FERRITIN in the last 72 hours. ?Studies/Results: ?DG Chest Port 1V same Day ? ?Result Date: 08/08/2021 ?CLINICAL DATA:  Postop EXAM: PORTABLE CHEST 1 VIEW COMPARISON:  08/05/2021; 08/02/2021 FINDINGS: Grossly unchanged cardiac silhouette and mediastinal contours post median sternotomy. Stable sequela of endovascular repair of the descending thoracic aorta. Interval removal of right jugular approach central venous catheter. Improved aeration of the lungs with persistent left basilar heterogeneous/consolidative opacities. No new focal airspace  opacities. No evidence of edema. No acute osseous abnormalities. Moderate degenerative change of the left glenohumeral joint is suspected though incompletely evaluated. Surgical clips overlie the right axilla. IMPRESSION: Improved aeration of the lungs with persistent left basilar opacities, likely atelectasis. Electronically Signed   By: Sandi Mariscal M.D.   On: 08/08/2021 08:46   ?Medications: ?Infusions: ? sodium chloride    ? magnesium sulfate bolus IVPB    ? ? ?Scheduled Medications: ? sodium chloride   Intravenous Once  ? bisacodyl  5 mg Oral Once  ? bisacodyl  10 mg Rectal Daily  ? docusate sodium  100 mg Oral BID  ? folic acid  1 mg Oral Daily  ? furosemide  60 mg Intravenous BID  ? heparin  5,000 Units Subcutaneous Q8H  ? insulin aspart  0-15 Units Subcutaneous TID AC & HS  ? mouth rinse  15 mL Mouth Rinse BID  ? multivitamin with minerals  1 tablet Oral Daily  ? pantoprazole (PROTONIX) IV  40 mg Intravenous Daily  ? polyethylene glycol  17 g Oral Daily  ? sodium bicarbonate  650 mg Oral TID  ? thiamine  100 mg Oral Daily  ? Or  ? thiamine  100 mg Intravenous Daily  ? ? have reviewed scheduled and prn medications. ? ?Physical Exam: ?General: up with PT- a little slow mentally but dont know baseline ?Heart: RRR ?Lungs: dec BS at the bases ?Abdomen: still a little distended ?Extremities: edema improved  ? ? ? ?08/09/2021,9:45 AM ? LOS: 13 days  ? ?  ? ? ? ? ?

## 2021-08-09 NOTE — Progress Notes (Signed)
[facility] ? ?Subjective  - no complaints.  Abdominal distension improving. ? ? ?Objective ?112/79 ?89 ?99.1 ?F (37.3 ?C) (Oral) ?20 ?96% ? ?Intake/Output Summary (Last 24 hours) at 08/09/2021 0748 ?Last data filed at 08/09/2021 0357 ?Gross per 24 hour  ?Intake 240 ml  ?Output 2225 ml  ?Net -1985 ml  ? ? ?Right groin c/d/I ?Bilateral DP palpable ?Abdomen softer, less distended ? ?Laboratory ?Lab Results: ?Recent Labs  ?  08/08/21 ?5852 08/09/21 ?0222  ?WBC 8.9 7.9  ?HGB 10.2* 9.1*  ?HCT 31.2* 28.5*  ?PLT 190 PLATELET CLUMPS NOTED ON SMEAR, UNABLE TO ESTIMATE  ? ?BMET ?Recent Labs  ?  08/08/21 ?7782 08/09/21 ?0222  ?NA 141 141  ?K 4.1 4.2  ?CL 107 103  ?CO2 27 28  ?GLUCOSE 98 98  ?BUN 47* 53*  ?CREATININE 2.62* 2.59*  ?CALCIUM 8.1* 8.3*  ? ? ?COAG ?Lab Results  ?Component Value Date  ? INR 1.7 (H) 08/01/2021  ? INR 1.6 (H) 08/01/2021  ? INR 1.1 07/27/2021  ? ?No results found for: PTT ? ?Assessment/Planning: ? ?Status post repair of ruptured descending thoracic dissection last Monday with hemorraghic shock.  Continues to have palpable pedal pulses.  Ileus improving, less distended, multiple bowel movements.  Creatinine continues to improve to 2.59 with AKI in the setting of CKD.  White blood cell stable at 7.9.  Hemoglobin 9.1.  We will likely delay repeat CTA until 1 month postop at follow-up in the office given AKI on CKD that is improving and give him the best chance for renal recovery.  OK for CIR from my standpoint. ? ?Marty Heck ?08/09/2021 ?7:48 AM ?-- ? ? ?

## 2021-08-09 NOTE — Progress Notes (Signed)
Physical Therapy Treatment ?Patient Details ?Name: Edward Hopkins ?MRN: [id number] ?DOB: May 04, 1951 ?Today's Date: 08/09/2021 ? ? ?History of Present Illness Pt is a 71 y.o. male who presented 07/27/21 with chest and back pain. Pt found to have hypertensive emergency with recurrent acute aortic type B dissection. S/p emergent repair of a ruptured descending thoracic dissection including extension of the dissection into the visceral segment with hemorrhagic shock 3/20. Extubated 3/23. PMH: HTN, Hep C, polysubstance abuse including ongoing cocaine use, Aortic dissection s/p thoracic aortic graft repair, thoracic aortic aneurysm, CHF, DVT, osteomyelitis, endocarditis ? ?  ?PT Comments  ? ? Patient progressing well towards PT goals. Requires increased time to perform all mobility with step by step cues for sequencing and to perform tasks. Requires Mod A of 2 for all mobility and use of RW for support. Improved ambulation distance today with Min A of 2 and close chair follow due to lightheadedness and fatigue. Sp02 dropped to mid 80s on RA during session, improved to >90% at rest on RA. Continues to be appropriate for AIR to maximize independence and mobility prior to return home. Will follow. ?  ?Recommendations for follow up therapy are one component of a multi-disciplinary discharge planning process, led by the attending physician.  Recommendations may be updated based on patient status, additional functional criteria and insurance authorization. ? ?Follow Up Recommendations ? Acute inpatient rehab (3hours/day) ?  ?  ?Assistance Recommended at Discharge Frequent or constant Supervision/Assistance  ?Patient can return home with the following A lot of help with walking and/or transfers;Two people to help with walking and/or transfers;A lot of help with bathing/dressing/bathroom;Two people to help with bathing/dressing/bathroom;Assistance with cooking/housework;Direct supervision/assist for medications management;Direct  supervision/assist for financial management;Assist for transportation;Help with stairs or ramp for entrance ?  ?Equipment Recommendations ? Rolling walker (2 wheels);BSC/3in1;Wheelchair (measurements PT);Wheelchair cushion (measurements PT);Hospital bed;Other (comment)  ?  ?Recommendations for Other Services   ? ? ?  ?Precautions / Restrictions Precautions ?Precautions: Fall;Other (comment) ?Precaution Comments: watch 02 ?Restrictions ?Weight Bearing Restrictions: No  ?  ? ?Mobility ? Bed Mobility ?Overal bed mobility: Needs Assistance ?Bed Mobility: Rolling, Sidelying to Sit ?Rolling: Mod assist, +2 for physical assistance ?Sidelying to sit: Max assist, HOB elevated, +2 for physical assistance ?  ?  ?  ?General bed mobility comments: Step by step cues for sequencing, assist with LEs and to roll onto left side and reach for rail, assist with trunk., increased time. ?  ? ?Transfers ?Overall transfer level: Needs assistance ?Equipment used: Rolling walker (2 wheels) ?Transfers: Sit to/from Stand, Bed to chair/wheelchair/BSC ?Sit to Stand: Mod assist, +2 physical assistance ?  ?  ?  ?  ?  ?General transfer comment: Stood from EOB requiring Mod A of 2 with cues for technique/hand placement. Pt with tendency to scoot too far forward on EOB, BSC placed behind pt due to needing to have a BM, transferred to chair post ambulation. ?  ? ?Ambulation/Gait ?Ambulation/Gait assistance: Min assist, +2 safety/equipment ?Gait Distance (Feet): 4 Feet (+ 8') ?Assistive device: Rolling walker (2 wheels) ?Gait Pattern/deviations: Decreased step length - right, Decreased step length - left, Shuffle, Narrow base of support, Step-through pattern ?Gait velocity: decreased ?Gait velocity interpretation: <1.8 ft/sec, indicate of risk for recurrent falls ?  ?General Gait Details: Slow, shuffling gait with heavy reliance on UEs Able to advance RLE today. Cues for hip extension/upright. + lightheadedness.  Sp02 dropped to mid 80s on RA with  ambulation so donned 02. ? ? ?Stairs ?  ?  ?  ?  ?  ? ? ?  Wheelchair Mobility ?  ? ?Modified Rankin (Stroke Patients Only) ?Modified Rankin (Stroke Patients Only) ?Pre-Morbid Rankin Score: Slight disability ?Modified Rankin: Moderately severe disability ? ? ?  ?Balance Overall balance assessment: Needs assistance ?Sitting-balance support: Feet supported, Bilateral upper extremity supported ?Sitting balance-Leahy Scale: Poor ?Sitting balance - Comments: Pt with posterior lean needing MOd A progressing to Min A to sit EOB, tendency to sit on very edge of bed. ?  ?Standing balance support: During functional activity, Reliant on assistive device for balance ?Standing balance-Leahy Scale: Poor ?Standing balance comment: Min A for standing with RW. Cues for upright. ?  ?  ?  ?  ?  ?  ?  ?  ?  ?  ?  ?  ? ?  ?Cognition Arousal/Alertness: Lethargic ?Behavior During Therapy: Flat affect ?Overall Cognitive Status: Impaired/Different from baseline ?Area of Impairment: Following commands, Problem solving, Memory, Attention ?  ?  ?  ?  ?  ?  ?  ?  ?  ?  ?Memory: Decreased short-term memory ?Following Commands: Follows one step commands with increased time, Follows multi-step commands inconsistently ?Safety/Judgement: Decreased awareness of safety, Decreased awareness of deficits ?Awareness: Intellectual ?Problem Solving: Slow processing, Difficulty sequencing, Requires verbal cues, Decreased initiation, Requires tactile cues ?  ?  ?  ? ?  ?Exercises   ? ?  ?General Comments General comments (skin integrity, edema, etc.): SP02 dropped to mid 80s on RA with ambulation, staying in 90s at rest. ?  ?  ? ?Pertinent Vitals/Pain Pain Assessment ?Pain Assessment: No/denies pain  ? ? ?Home Living   ?  ?  ?  ?  ?  ?  ?  ?  ?  ?   ?  ?Prior Function    ?  ?  ?   ? ?PT Goals (current goals can now be found in the care plan section) Progress towards PT goals: Progressing toward goals ? ?  ?Frequency ? ? ? Min 4X/week ? ? ? ?  ?PT Plan Current  plan remains appropriate  ? ? ?Co-evaluation PT/OT/SLP Co-Evaluation/Treatment: Yes ?Reason for Co-Treatment: For patient/therapist safety;To address functional/ADL transfers ?PT goals addressed during session: Mobility/safety with mobility;Balance;Proper use of DME;Strengthening/ROM ?OT goals addressed during session: ADL's and self-care;Proper use of Adaptive equipment and DME ?  ? ?  ?AM-PAC PT "6 Clicks" Mobility   ?Outcome Measure ? Help needed turning from your back to your side while in a flat bed without using bedrails?: A Lot ?Help needed moving from lying on your back to sitting on the side of a flat bed without using bedrails?: A Lot ?Help needed moving to and from a bed to a chair (including a wheelchair)?: A Lot ?Help needed standing up from a chair using your arms (e.g., wheelchair or bedside chair)?: Total ?Help needed to walk in hospital room?: A Little ?Help needed climbing 3-5 steps with a railing? : Total ?6 Click Score: 11 ? ?  ?End of Session Equipment Utilized During Treatment: Gait belt ?Activity Tolerance: Patient tolerated treatment well ?Patient left: in chair;with call bell/phone within reach;with nursing/sitter in room ?Nurse Communication: Mobility status;Need for lift equipment (stedy) ?PT Visit Diagnosis: Unsteadiness on feet (R26.81);Muscle weakness (generalized) (M62.81);History of falling (Z91.81);Difficulty in walking, not elsewhere classified (R26.2);Other abnormalities of gait and mobility (R26.89) ?  ? ? ?Time: 7062-3762 ?PT Time Calculation (min) (ACUTE ONLY): 40 min ? ?Charges:  $Gait Training: 8-22 mins ?$Therapeutic Activity: 8-22 mins          ?          ? ?  Marisa Severin, PT, DPT ?Acute Rehabilitation Services ?Pager [phone] ?Office [phone] ? ? ? ? ? ?[place] ?08/09/2021, 11:42 AM ? ?

## 2021-08-09 NOTE — Progress Notes (Signed)
?PROGRESS NOTE ? ? ? ?Edward Hopkins  DDU:[id number] DOB: 1950-08-28 DOA: 07/27/2021 ?PCP: Pcp, No  ? ?Brief Narrative:  ?71 year old with history of hepatitis C, HTN, polysubstance abuse including cocaine, aortic dissection status post thoracic aortic graft repair, thoracic aortic aneurysm comes to the hospital on 3/15 with complaints of chest pain.  Found to have type B dissection with flap distal to left subclavian artery extending into SMA on the CT of the chest.  Initially patient was monitored in the ICU with aggressive blood pressure control and eventually transferred on 3/17.  On the morning of 3/20 developed acute hypotension and repeat CTA showed extension of his dissection and was taken to the OR emergently.  This was repaired and taken back to the ICU where he remained intubated and on multiple pressors.  CT head was negative.  Eventually was extubated on 3/24.  Hospital course was also complicated by acute kidney injury therefore nephrology team is following.  Renal function continues to improve.  PT/OT has recommended CIR thereforewill initiate the process. ? ? ?Assessment & Plan: ? Principal Problem: ?  Dissection of aorta ([place]) ?Active Problems: ?  Acute kidney injury ([place]) ?  Cocaine abuse ([place]) ?  Shock ([place]) ?  ? ?Ruptured aortic aneurysm ?-Recurrent rupture of the aneurysm initially managed medically but later on 3/20 developed acute hypotension with extension of his renal dissection requiring urgent surgical repair by vascular.  Was in the ICU intubated on multiple pressors.  Eventually extubated 3/24. ?-BP meds-amlodipine.  Metoprolol on hold ? ?  ?Acute Hypoxic Respiratory Failure:  ?-Secondary to fluid overload.  Pulmonary edema and pleural effusions.  On diuretics. ?  ?Acute Kidney Injury; slowly improving with diuresis ?Metabolic acidosis ?-Suspect from BP swings or contrast-induced. Renal following. ?-Baseline 1.2.  Peaked 3.5. Cr 2.79 > 2.62 > 2.59.  Received albumin during his admission.   We will give 2 more doses of IV Lasix 60 mg again. ?  ?Ileus  ?-Improved.  Bowel regimen as needed. ?  ?Right facial droop and some right lower extremity weakness, subacute.  No known history of CVA ?-CT of the head is negative.  Mentation appears to be at baseline therefore we will hold off on any further imaging. ?  ?Cocaine use disorder ?Bilateral inguinal hernias (L>R) ?Counseled to quit using this ? ?History of hepatitis C ?- Status post treatment ? ?Transaminitis ?- Resolved ? ?PT/OT-recommended CIR.  Will initiate process. ? ?DVT prophylaxis: SQ Hep ?Code Status: Full ?Family Communication:   ? ?Status is: Inpatient ?Remains inpatient appropriate because: We will initiate the CIR placement process.  Seen by vascular and nephrology. ?Peer-to-peer performed by me today with Dr. Rosana Hoes from [place], case was denied.  TOC has appealed the case. ? ? ? ? ?Subjective: ?Seen and examined at bedside, patient is on any complaints. ? ?Examination: ?Constitutional: Not in acute distress ?Respiratory: Minimal bibasilar crackles ?Cardiovascular: Normal sinus rhythm, no rubs ?Abdomen: Nontender nondistended good bowel sounds ?Musculoskeletal: 2+ bilateral lower extremity pitting edema ?Skin: No rashes seen ?Neurologic: CN 2-12 grossly intact.  And nonfocal ?Psychiatric: Normal judgment and insight. Alert and oriented x 3. Normal mood. ?Objective: ?Vitals:  ? 08/08/21 2040 08/08/21 2315 08/09/21 0355 08/09/21 0506  ?BP: 123/76 127/82 112/79   ?Pulse: 82 88 89   ?Resp: 20 20 20    ?Temp: 98.9 ?F (37.2 ?C)  99.1 ?F (37.3 ?C)   ?TempSrc: Oral  Oral   ?SpO2: 100% 96% 96%   ?Weight:    97.1 kg  ?  Height:      ? ? ?Intake/Output Summary (Last 24 hours) at 08/09/2021 0757 ?Last data filed at 08/09/2021 0357 ?Gross per 24 hour  ?Intake 240 ml  ?Output 2225 ml  ?Net -1985 ml  ? ?Filed Weights  ? 08/06/21 0325 08/07/21 0436 08/09/21 0506  ?Weight: 97.1 kg 97 kg 97.1 kg  ? ? ? ?Data Reviewed:  ? ?CBC: ?Recent Labs  ?Lab 08/02/21 ?2114  08/03/21 ?0332 08/04/21 ?0248 08/05/21 ?0326 08/06/21 ?3154 08/08/21 ?0086 08/09/21 ?0222  ?WBC 8.7   < > 6.5 7.5 7.9 8.9 7.9  ?NEUTROABS 5.8  --   --   --   --   --   --   ?HGB 11.0*   < > 9.3* 9.7* 9.8* 10.2* 9.1*  ?HCT 32.2*   < > 28.2* 30.3* 29.3* 31.2* 28.5*  ?MCV 85.4   < > 87.0 88.6 86.4 87.2 87.4  ?PLT 82*   < > 81* 97* 127* 190 PLATELET CLUMPS NOTED ON SMEAR, UNABLE TO ESTIMATE  ? < > = values in this interval not displayed.  ? ?Basic Metabolic Panel: ?Recent Labs  ?Lab 08/02/21 ?2114 08/03/21 ?0332 08/04/21 ?0248 08/05/21 ?0326 08/06/21 ?7619 08/08/21 ?5093 08/09/21 ?0222  ?NA 137 138 139 141 142 141 141  ?K 4.2 4.2 4.1 3.8 4.1 4.1 4.2  ?CL 109 109 113* 113* 114* 107 103  ?CO2 17* 17* 16* 19* 19* 27 28  ?GLUCOSE 73 79 100* 91 77 98 98  ?BUN 55* 54* 52* 48* 46* 47* 53*  ?CREATININE 3.25* 3.24* 2.96* 2.83* 2.79* 2.62* 2.59*  ?CALCIUM 7.7* 7.7* 7.7* 7.9* 8.0* 8.1* 8.3*  ?MG 2.1 2.2 2.3  --  2.3 2.1 2.2  ?PHOS 4.2 4.4 4.0  --   --   --   --   ? ?GFR: ?Estimated Creatinine Clearance: 29.2 mL/min (A) (by C-G formula based on SCr of 2.59 mg/dL (H)). ?Liver Function Tests: ?Recent Labs  ?Lab 08/04/21 ?0248 08/05/21 ?0326  ?AST 29 29  ?ALT 11 11  ?ALKPHOS 51 56  ?BILITOT 3.9* 3.7*  ?PROT 5.3* 5.6*  ?ALBUMIN 1.7* 1.8*  ? ?No results for input(s): LIPASE, AMYLASE in the last 168 hours. ?No results for input(s): AMMONIA in the last 168 hours. ?Coagulation Profile: ?No results for input(s): INR, PROTIME in the last 168 hours. ? ?Cardiac Enzymes: ?No results for input(s): CKTOTAL, CKMB, CKMBINDEX, TROPONINI in the last 168 hours. ?BNP (last 3 results) ?No results for input(s): PROBNP in the last 8760 hours. ?HbA1C: ?No results for input(s): HGBA1C in the last 72 hours. ?CBG: ?Recent Labs  ?Lab 08/08/21 ?2671 08/08/21 ?1115 08/08/21 ?1633 08/08/21 ?2139 08/09/21 ?0604  ?GLUCAP 93 93 102* 73 101*  ? ?Lipid Profile: ?No results for input(s): CHOL, HDL, LDLCALC, TRIG, CHOLHDL, LDLDIRECT in the last 72 hours. ?Thyroid  Function Tests: ?No results for input(s): TSH, T4TOTAL, FREET4, T3FREE, THYROIDAB in the last 72 hours. ?Anemia Panel: ?No results for input(s): VITAMINB12, FOLATE, FERRITIN, TIBC, IRON, RETICCTPCT in the last 72 hours. ?Sepsis Labs: ?Recent Labs  ?Lab 08/02/21 ?0856 08/05/21 ?0440  ?LATICACIDVEN 1.4 0.7  ? ? ?No results found for this or any previous visit (from the past 240 hour(s)).  ? ? ? ? ? ?Radiology Studies: ?DG Chest Port 1V same Day ? ?Result Date: 08/08/2021 ?CLINICAL DATA:  Postop EXAM: PORTABLE CHEST 1 VIEW COMPARISON:  08/05/2021; 08/02/2021 FINDINGS: Grossly unchanged cardiac silhouette and mediastinal contours post median sternotomy. Stable sequela of endovascular repair of the descending thoracic aorta. Interval removal  of right jugular approach central venous catheter. Improved aeration of the lungs with persistent left basilar heterogeneous/consolidative opacities. No new focal airspace opacities. No evidence of edema. No acute osseous abnormalities. Moderate degenerative change of the left glenohumeral joint is suspected though incompletely evaluated. Surgical clips overlie the right axilla. IMPRESSION: Improved aeration of the lungs with persistent left basilar opacities, likely atelectasis. Electronically Signed   By: Sandi Mariscal M.D.   On: 08/08/2021 08:46   ? ? ? ? ? ?Scheduled Meds: ? sodium chloride   Intravenous Once  ? bisacodyl  5 mg Oral Once  ? bisacodyl  10 mg Rectal Daily  ? docusate sodium  100 mg Oral BID  ? folic acid  1 mg Oral Daily  ? heparin  5,000 Units Subcutaneous Q8H  ? insulin aspart  0-15 Units Subcutaneous TID AC & HS  ? mouth rinse  15 mL Mouth Rinse BID  ? multivitamin with minerals  1 tablet Oral Daily  ? pantoprazole (PROTONIX) IV  40 mg Intravenous Daily  ? polyethylene glycol  17 g Oral Daily  ? sodium bicarbonate  650 mg Oral TID  ? thiamine  100 mg Oral Daily  ? Or  ? thiamine  100 mg Intravenous Daily  ? ?Continuous Infusions: ? sodium chloride    ? magnesium  sulfate bolus IVPB    ? ? ? LOS: 13 days  ? ?Time spent= 35 mins ? ? ? ?Kiahna Banghart Arsenio Loader, MD ?[facility] ? ?If 7PM-7AM, please contact night-coverage ? ?08/09/2021, 7:57 AM  ? ?

## 2021-08-09 NOTE — Care Management Important Message (Signed)
Important Message ? ?Patient Details  ?Name: Edward Hopkins ?MRN: [id number] ?Date of Birth: 1950-11-02 ? ? ?Medicare Important Message Given:  Yes ? ? ? ? ?[name] ?08/09/2021, 10:47 AM ?

## 2021-08-10 DIAGNOSIS — F1410 Cocaine abuse, uncomplicated: Secondary | ICD-10-CM | POA: Diagnosis not present

## 2021-08-10 DIAGNOSIS — N179 Acute kidney failure, unspecified: Secondary | ICD-10-CM | POA: Diagnosis not present

## 2021-08-10 DIAGNOSIS — I71012 Dissection of descending thoracic aorta: Secondary | ICD-10-CM | POA: Diagnosis not present

## 2021-08-10 LAB — CBC
HCT: 31.5 % — ABNORMAL LOW (ref 39.0–52.0)
Hemoglobin: 10 g/dL — ABNORMAL LOW (ref 13.0–17.0)
MCH: 27.9 pg (ref 26.0–34.0)
MCHC: 31.7 g/dL (ref 30.0–36.0)
MCV: 87.7 fL (ref 80.0–100.0)
Platelets: 154 10*3/uL (ref 150–400)
RBC: 3.59 MIL/uL — ABNORMAL LOW (ref 4.22–5.81)
RDW: 15.8 % — ABNORMAL HIGH (ref 11.5–15.5)
WBC: 7.5 10*3/uL (ref 4.0–10.5)
nRBC: 0 % (ref 0.0–0.2)

## 2021-08-10 LAB — BASIC METABOLIC PANEL
Anion gap: 13 (ref 5–15)
BUN: 46 mg/dL — ABNORMAL HIGH (ref 8–23)
CO2: 24 mmol/L (ref 22–32)
Calcium: 8.3 mg/dL — ABNORMAL LOW (ref 8.9–10.3)
Chloride: 101 mmol/L (ref 98–111)
Creatinine, Ser: 2.61 mg/dL — ABNORMAL HIGH (ref 0.61–1.24)
GFR, Estimated: 25 mL/min — ABNORMAL LOW (ref >60)
Glucose, Bld: 98 mg/dL (ref 70–99)
Potassium: 4.8 mmol/L (ref 3.5–5.1)
Sodium: 138 mmol/L (ref 135–145)

## 2021-08-10 LAB — GLUCOSE, CAPILLARY
Glucose-Capillary: 98 mg/dL (ref 70–99)
Glucose-Capillary: 151 mg/dL — ABNORMAL HIGH (ref 70–99)
Glucose-Capillary: 97 mg/dL (ref 70–99)
Glucose-Capillary: 98 mg/dL (ref 70–99)

## 2021-08-10 LAB — MAGNESIUM: Magnesium: 2.1 mg/dL (ref 1.7–2.4)

## 2021-08-10 MED ORDER — FUROSEMIDE 40 MG PO TABS
40.0000 mg | ORAL_TABLET | Freq: Every day | ORAL | Status: DC
  Administered 2021-08-11: 40 mg via ORAL
  Filled 2021-08-10: qty 1

## 2021-08-10 NOTE — Progress Notes (Signed)
Physical Therapy Treatment ?Patient Details ?Name: Edward Hopkins ?MRN: [id number] ?DOB: June 10, 1950 ?Today's Date: 08/10/2021 ? ? ?History of Present Illness Pt is a 71 y.o. male who presented 07/27/21 with chest and back pain. Pt found to have hypertensive emergency with recurrent acute aortic type B dissection. S/p emergent repair of a ruptured descending thoracic dissection including extension of the dissection into the visceral segment with hemorrhagic shock 3/20. Extubated 3/23. Right facial droop and some right lower extremity weakness, subacute. No known history of CVA  -CT of the head is negative. PMH: HTN, Hep C, polysubstance abuse including ongoing cocaine use, Aortic dissection s/p thoracic aortic graft repair, thoracic aortic aneurysm, CHF, DVT, osteomyelitis, endocarditis ? ?  ?PT Comments  ? ? Pt received in recliner fully reclined, drowsy initially but agreeable to therapy session and c/o sacral/bottom discomfort after sitting up since 10am. RN/secretary notified pt would benefit from cushion for pressure relief and more frequent repositioning. Pt able to perform transfers and pre-gait tasks at bedside (~44ft) with RW and up to [place]. Emphasis on safety with bed mobility, transfers, use of call bell, pressure relief strategies/frequency and fall prevention. Pt receptive to instruction but will need reinforcement due to cognitive deficit. Pt reports moderate fatigue at end of session, RN notified pt requesting a bath. Pt continues to benefit from PT services to progress toward functional mobility goals. Plan to progress gait with chair follow for safety next session.  ?Recommendations for follow up therapy are one component of a multi-disciplinary discharge planning process, led by the attending physician.  Recommendations may be updated based on patient status, additional functional criteria and insurance authorization. ? ?Follow Up Recommendations ? Acute inpatient rehab (3hours/day) ?  ?  ?Assistance  Recommended at Discharge Frequent or constant Supervision/Assistance  ?Patient can return home with the following A lot of help with walking and/or transfers;Two people to help with walking and/or transfers;A lot of help with bathing/dressing/bathroom;Two people to help with bathing/dressing/bathroom;Assistance with cooking/housework;Direct supervision/assist for medications management;Direct supervision/assist for financial management;Assist for transportation;Help with stairs or ramp for entrance ?  ?Equipment Recommendations ? Rolling walker (2 wheels)  ?  ?Recommendations for Other Services   ? ? ?  ?Precautions / Restrictions Precautions ?Precautions: Fall;Other (comment) ?Precaution Comments: watch 02 ?Restrictions ?Weight Bearing Restrictions: No  ?  ? ?Mobility ? Bed Mobility ?Overal bed mobility: Needs Assistance ?Bed Mobility: Rolling, Sit to Sidelying ?Rolling: Min assist, +2 for safety/equipment ?  ?  ?  ?Sit to sidelying: Mod assist, +2 for physical assistance, HOB elevated ?General bed mobility comments: Step by step multimodal cues for sequencing, assist with LEs, increased time. ?  ? ?Transfers ?Overall transfer level: Needs assistance ?Equipment used: Rolling walker (2 wheels) ?Transfers: Sit to/from Stand, Bed to chair/wheelchair/BSC ?Sit to Stand: Mod assist, +2 physical assistance ?  ?  ?  ?  ?  ?General transfer comment: Stood from recliner chair requiring Mod A of 2 with multiple multimodal cues for technique/hand placement. Pt with tendency to scoot too far forward on seat, needs B feet blocked to prevent sliding further forward, hand over hand assist for safe UE placement ?  ? ?Ambulation/Gait ?Ambulation/Gait assistance: Min assist, +2 safety/equipment ?Gait Distance (Feet): 4 Feet ?Assistive device: Rolling walker (2 wheels) ?Gait Pattern/deviations: Decreased step length - right, Decreased step length - left, Shuffle, Narrow base of support, Step-to pattern, Trunk flexed ?Gait velocity:  decreased ?  ?  ?General Gait Details: Slow, shuffling gait with heavy reliance on UEs Able  to advance RLE today. Cues for hip extension/upright. + lightheadedness.  BP 114/66 (80) seated EOB; SpO2 94% on 4L ? ? ?  ?Balance Overall balance assessment: Needs assistance ?Sitting-balance support: Feet supported, Bilateral upper extremity supported ?Sitting balance-Leahy Scale: Poor ?Sitting balance - Comments: Pt with posterior lean needing MOd A progressing to Min A to sit upright, tendency to sit on very edge of chair. ?Postural control: Posterior lean ?Standing balance support: During functional activity, Reliant on assistive device for balance ?Standing balance-Leahy Scale: Poor ?Standing balance comment: heavy BUE reliance on RW handles, +2 assist needed ?  ?  ?  ?  ?  ?  ?  ?  ?  ?  ?  ?  ? ?  ?Cognition Arousal/Alertness: Lethargic ?Behavior During Therapy: Flat affect ?Overall Cognitive Status: Impaired/Different from baseline ?Area of Impairment: Following commands, Problem solving, Memory, Attention, Safety/judgement, Awareness ?  ?  ?  ?  ?  ?  ?  ?  ?  ?Current Attention Level: Focused, Sustained ?Memory: Decreased short-term memory ?Following Commands: Follows one step commands with increased time, Follows multi-step commands inconsistently ?Safety/Judgement: Decreased awareness of safety, Decreased awareness of deficits ?Awareness: Intellectual ?Problem Solving: Slow processing, Difficulty sequencing, Requires verbal cues, Decreased initiation, Requires tactile cues ?General Comments: Decreased initiation, requires step by step cues for sequencing/technique and repetition. Pt with poor safety awareness and scooting hips too far anterior almost scooting off chair seat prior to standing. Benefits from multimodal cues. ?  ?  ? ?  ?Exercises   ? ?  ?General Comments General comments (skin integrity, edema, etc.): BP 114/66 (80) seated EOB post-exertion; HR 70's bpm seated and SpO2 94% on 4L O2 [place]. DOE 2/4. ?   ?  ? ?Pertinent Vitals/Pain Pain Assessment ?Pain Assessment: Faces ?Faces Pain Scale: Hurts even more ?Pain Location: bottom/sacral ?Pain Descriptors / Indicators: Grimacing, Discomfort ?Pain Intervention(s): Monitored during session, Repositioned, Other (comment) (pt states "I am worried about getting a bed sore")  ? ? ?   ?   ? ?PT Goals (current goals can now be found in the care plan section) Acute Rehab PT Goals ?Patient Stated Goal: Less pain in bottom, repositioning for comfort; unable to state long term goal today ?PT Goal Formulation: With patient/family ?Time For Goal Achievement: 08/19/21 ?Progress towards PT goals: Progressing toward goals ? ?  ?Frequency ? ? ? Min 4X/week ? ? ? ?  ?PT Plan Current plan remains appropriate  ? ? ?   ?AM-PAC PT "6 Clicks" Mobility   ?Outcome Measure ? Help needed turning from your back to your side while in a flat bed without using bedrails?: A Lot ?Help needed moving from lying on your back to sitting on the side of a flat bed without using bedrails?: A Lot ?Help needed moving to and from a bed to a chair (including a wheelchair)?: A Lot ?Help needed standing up from a chair using your arms (e.g., wheelchair or bedside chair)?: A Lot ?Help needed to walk in hospital room?: A Lot ?Help needed climbing 3-5 steps with a railing? : Total ?6 Click Score: 11 ? ?  ?End of Session Equipment Utilized During Treatment: Gait belt;Oxygen ?Activity Tolerance: Patient tolerated treatment well ?Patient left: with call bell/phone within reach;in bed;with bed alarm set;with nursing/sitter in room (RN in room to give him a bath) ?Nurse Communication: Mobility status;Other (comment) (pt needs blue geomat cushion for chair) ?PT Visit Diagnosis: Unsteadiness on feet (R26.81);Muscle weakness (generalized) (M62.81);History of falling (Z91.81);Difficulty in walking,  not elsewhere classified (R26.2);Other abnormalities of gait and mobility (R26.89) ?  ? ? ?Time: 6861-6837 ?PT Time Calculation  (min) (ACUTE ONLY): 21 min ? ?Charges:  $Therapeutic Activity: 8-22 mins          ?          ? ?Welma Mccombs P., PTA ?Acute Rehabilitation Services ?Secure Chat Preferred 9a-5:30pm ?Office: [phone]  ? ? ?Ca

## 2021-08-10 NOTE — Progress Notes (Addendum)
[facility] ? ?Subjective  - no issues this am ? ? ?Objective ?128/76 ?95 ?98.6 ?F (37 ?C) (Oral) ?19 ?100% ? ?Intake/Output Summary (Last 24 hours) at 08/10/2021 0734 ?Last data filed at 08/09/2021 2000 ?Gross per 24 hour  ?Intake 580 ml  ?Output 1500 ml  ?Net -920 ml  ? ?Right groins soft, dressing removed ?Moving LE well, palpable DP pulses B ?Abdomin NTTP  ? ?Assessment/Planning: ?Status post repair of ruptured descending thoracic dissection last Monday with hemorraghic shock Emergently by Dr. Carlis Abbott ? ?Good inflow with palpable pedal pulses ?Cr 2.6 good urine Op >1500 last 24 hours ?General no acute distress ?Plan for repeat CTA in 4 weeks still watching Cr prior to contrast load for CTA ?HGB improved 10.0 today s/p total 4 units PRBC 08/01/21 ? ? ?Roxy Horseman ?08/10/2021 ?7:34 AM ?-- ? ?Laboratory ?Lab Results: ?Recent Labs  ?  08/09/21 ?0222 08/10/21 ?0154  ?WBC 7.9 7.5  ?HGB 9.1* 10.0*  ?HCT 28.5* 31.5*  ?PLT PLATELET CLUMPS NOTED ON SMEAR, UNABLE TO ESTIMATE 154  ? ?BMET ?Recent Labs  ?  08/09/21 ?0222 08/10/21 ?0154  ?NA 141 138  ?K 4.2 4.8  ?CL 103 101  ?CO2 28 24  ?GLUCOSE 98 98  ?BUN 53* 46*  ?CREATININE 2.59* 2.61*  ?CALCIUM 8.3* 8.3*  ? ? ?COAG ?Lab Results  ?Component Value Date  ? INR 1.7 (H) 08/01/2021  ? INR 1.6 (H) 08/01/2021  ? INR 1.1 07/27/2021  ? ?No results found for: PTT ? ?I have seen and evaluated the patient. I agree with the PA note as documented above. Status post repair of ruptured descending thoracic dissection last Monday with hemorraghic shock.  Continues to have palpable pedal pulses.  Creatinine stable with AKI in the setting of CKD.  White blood cell stable at 7.5.  Hemoglobin 9.1 --> 10.  We will likely delay repeat CTA until 1 month postop at follow-up in the office given AKI on CKD that is improving and give him the best chance for renal recovery.  Ileus improved. ? ?Marty Heck, MD ?[facility] ?Office: [phone] ? ? ?

## 2021-08-10 NOTE — Progress Notes (Signed)
Subjective:  at least 1500 of UOP-  crt seems to have reached a plateau of 2.5-2.6 ?    ?Objective ?Vital signs in last 24 hours: ?Vitals:  ? 08/09/21 2338 08/10/21 0519 08/10/21 0757 08/10/21 1058  ?BP: (!) 133/106 128/76 (!) 143/78 109/72  ?Pulse: 75 95 84 81  ?Resp: (!) 24 19 18 17   ?Temp: 97.9 ?F (36.6 ?C) 98.6 ?F (37 ?C) 98.8 ?F (37.1 ?C) 98.8 ?F (37.1 ?C)  ?TempSrc: Oral Oral Oral Oral  ?SpO2: 95% 100% 99% 98%  ?Weight:  (!) 211 kg    ?Height:      ? ?Weight change: 113.9 kg ? ?Intake/Output Summary (Last 24 hours) at 08/10/2021 1150 ?Last data filed at 08/10/2021 1059 ?Gross per 24 hour  ?Intake 698 ml  ?Output 1500 ml  ?Net -802 ml  ? ? ?Assessment/ Plan: ?Pt is a 71 y.o. yo male who was admitted on 07/27/2021 with needing emergent repair of descending thoracic dissection involving right renal artery ?Assessment/Plan: ?1. Renal-  creatinine upon admission 1.27.  A on CRF in the setting of descending thoracic dissection with renal artery involvement-  emergent repair and hemorrhagic shock.  Did not require dialysis.  -  crt peaked at 3.25-  now has shown a steady improvement over the last week but has been stable the last 3 days-  this may be as good as his renal function will get.  Non oliguric.   Not uremic ?2. HTN/volume-  volume overloaded-  had some more aggressive diuresis over the weekend- now lasix 60 daily iv daily - -  edema is improved-  I am going to change it to po daily ?3. Anemia-  nadir of 9.3 but improving slowly -  I dont see any ESA-  have started for hgb of 9.1 ?4. [place]-  K of 4.8 on PRN repletion-  last Mag of 2.1 ?5. Metabolic acidosis-  better-  will stop bicarb  ? ?Kidney function stable the last 3 days-  may be his new baseline-  have changed lasix to PO and will keep aranesp while here but does not need to be prescribed at discharge.  I will arrange OP follow up in our clinic.  Renal will sign off-  call with questions  ? ?Louis Meckel  ? ? ?Labs: ?Basic Metabolic  Panel: ?Recent Labs  ?Lab 08/04/21 ?0248 08/05/21 ?0326 08/08/21 ?5277 08/09/21 ?0222 08/10/21 ?0154  ?NA 139   < > 141 141 138  ?K 4.1   < > 4.1 4.2 4.8  ?CL 113*   < > 107 103 101  ?CO2 16*   < > 27 28 24   ?GLUCOSE 100*   < > 98 98 98  ?BUN 52*   < > 47* 53* 46*  ?CREATININE 2.96*   < > 2.62* 2.59* 2.61*  ?CALCIUM 7.7*   < > 8.1* 8.3* 8.3*  ?PHOS 4.0  --   --   --   --   ? < > = values in this interval not displayed.  ? ?Liver Function Tests: ?Recent Labs  ?Lab 08/04/21 ?0248 08/05/21 ?0326  ?AST 29 29  ?ALT 11 11  ?ALKPHOS 51 56  ?BILITOT 3.9* 3.7*  ?PROT 5.3* 5.6*  ?ALBUMIN 1.7* 1.8*  ? ?No results for input(s): LIPASE, AMYLASE in the last 168 hours. ?No results for input(s): AMMONIA in the last 168 hours. ?CBC: ?Recent Labs  ?Lab 08/05/21 ?0326 08/06/21 ?8242 08/08/21 ?3536 08/09/21 ?0222 08/10/21 ?0154  ?WBC 7.5 7.9 8.9 7.9  7.5  ?HGB 9.7* 9.8* 10.2* 9.1* 10.0*  ?HCT 30.3* 29.3* 31.2* 28.5* 31.5*  ?MCV 88.6 86.4 87.2 87.4 87.7  ?PLT 97* 127* 190 PLATELET CLUMPS NOTED ON SMEAR, UNABLE TO ESTIMATE 154  ? ?Cardiac Enzymes: ?No results for input(s): CKTOTAL, CKMB, CKMBINDEX, TROPONINI in the last 168 hours. ?CBG: ?Recent Labs  ?Lab 08/09/21 ?1226 08/09/21 ?1601 08/09/21 ?2117 08/10/21 ?5701 08/10/21 ?1100  ?GLUCAP 121* 103* 166* 98 151*  ? ? ?Iron Studies: No results for input(s): IRON, TIBC, TRANSFERRIN, FERRITIN in the last 72 hours. ?Studies/Results: ?No results found. ?Medications: ?Infusions: ? sodium chloride    ? magnesium sulfate bolus IVPB    ? ? ?Scheduled Medications: ? sodium chloride   Intravenous Once  ? bisacodyl  10 mg Rectal Daily  ? darbepoetin (ARANESP) injection - NON-DIALYSIS  100 mcg Subcutaneous Q Tue-1800  ? docusate sodium  100 mg Oral BID  ? folic acid  1 mg Oral Daily  ? furosemide  60 mg Intravenous Daily  ? heparin  5,000 Units Subcutaneous Q8H  ? insulin aspart  0-15 Units Subcutaneous TID AC & HS  ? mouth rinse  15 mL Mouth Rinse BID  ? multivitamin with minerals  1 tablet Oral Daily   ? pantoprazole  40 mg Oral Daily  ? polyethylene glycol  17 g Oral Daily  ? sodium bicarbonate  650 mg Oral TID  ? thiamine  100 mg Oral Daily  ? Or  ? thiamine  100 mg Intravenous Daily  ? ? have reviewed scheduled and prn medications. ? ?Physical Exam: ?General: up in bedside chair- NAD-  improving slowly  ?Heart: RRR ?Lungs: dec BS at the bases ?Abdomen: still a little distended ?Extremities: edema improved but still present  ? ? ? ?08/10/2021,11:50 AM ? LOS: 14 days  ? ?  ? ? ? ? ?

## 2021-08-10 NOTE — Progress Notes (Signed)
Inpatient Rehab Admissions Coordinator:  ? ?I do not have a CIR bed for this Pt. Today. Insurance denied yesterday and I filed expedited appeal. Continue to await decision on appeal. ? ?Clemens Catholic, MS, CCC-SLP ?Rehab Admissions Coordinator  ?[phone] (celll) ?[phone] (office) ? ?

## 2021-08-10 NOTE — Progress Notes (Addendum)
?  Progress Note ? ? ?Patient: Edward Hopkins PRF:[id number] DOB: [birth year] DOA: 07/27/2021     14 ?DOS: the patient was seen and examined on 08/10/2021 ?  ?Brief hospital course: ?71 year old man suffered thoracic aortic dissection and underwent emergent operative repair complicated by acute kidney injury.  Plan for CIR. ? ?Assessment and Plan: ?Ruptured aortic aneurysm ?--initially managed medically but later on 3/20 developed acute hypotension with extension of his renal dissection requiring urgent surgical repair by vascular.   ?-BP stable, management per vascular.  ?  ?Acute Hypoxic Respiratory Failure:  ?-Secondary to fluid overload.  Pulmonary edema and pleural effusions.   ?--wean oxygen, continue oral Lasix ?  ?Acute Kidney Injury  ?--stable, nephrology speculates at new baseline, changed to oral Lasix, nephrology signed off ?  ?Ileus  ?-Improved.  Bowel regimen as needed. ?  ?Right facial droop and some right lower extremity weakness, subacute.  No known history of CVA ?-CT of the head negative. Previous teams did not pursue further workfup ?--no definite focal findings today ?  ?Cocaine use disorder ?  ?History of hepatitis C ?- Status post treatment ?  ?Transaminitis ?- Resolved ?  ?PT/OT-recommended CIR. Await appeal ? ?  ? ?Subjective:  ?Feels fine today ?No pain ?No weakness reported ? ?Physical Exam: ?Vitals:  ? 08/10/21 0757 08/10/21 1058 08/10/21 1500 08/10/21 1601  ?BP: (!) 143/78 109/72 (!) 119/56 110/75  ?Pulse: 84 81  78  ?Resp: 18 17 13 16   ?Temp: 98.8 ?F (37.1 ?C) 98.8 ?F (37.1 ?C) 97.9 ?F (36.6 ?C) 98 ?F (36.7 ?C)  ?TempSrc: Oral Oral Oral Oral  ?SpO2: 99% 98% 99% 97%  ?Weight:      ?Height:      ? ?Physical Exam ?Vitals reviewed.  ?Constitutional:   ?   General: He is not in acute distress. ?   Appearance: He is not ill-appearing or toxic-appearing.  ?Cardiovascular:  ?   Rate and Rhythm: Normal rate and regular rhythm.  ?   Heart sounds: No murmur heard. ?Pulmonary:  ?   Effort: Pulmonary  effort is normal. No respiratory distress.  ?   Breath sounds: No wheezing, rhonchi or rales.  ?Neurological:  ?   General: No focal deficit present.  ?   Mental Status: He is alert.  ?Psychiatric:     ?   Mood and Affect: Mood normal.     ?   Behavior: Behavior normal.  ? ? ?Data Reviewed: ? ?CBG stable ?Creatinine 2.61, stable ?Hgb stable 10.0 ? ?Family Communication:  ? ?Disposition: ?Status is: Inpatient ?Remains inpatient appropriate because: CIR pending ? Planned Discharge Destination:  CIR ? ? ? ?Time spent: 25 minutes ? ?Author: ?Murray Hodgkins, MD ?08/10/2021 7:20 PM ? ?For on call review [email or web address].  ?

## 2021-08-11 ENCOUNTER — Encounter (HOSPITAL_COMMUNITY): Payer: Self-pay | Admitting: Physical Medicine and Rehabilitation

## 2021-08-11 ENCOUNTER — Inpatient Hospital Stay (HOSPITAL_COMMUNITY): Payer: Medicare Other

## 2021-08-11 ENCOUNTER — Inpatient Hospital Stay (HOSPITAL_COMMUNITY)
Admission: RE | Admit: 2021-08-11 | Discharge: 2021-09-07 | DRG: 056 | Disposition: A | Discharge status: Skilled Nursing Facility | Payer: Medicare Other | Source: Intra-hospital | Attending: Physical Medicine and Rehabilitation | Admitting: Physical Medicine and Rehabilitation

## 2021-08-11 ENCOUNTER — Other Ambulatory Visit: Payer: Self-pay

## 2021-08-11 DIAGNOSIS — I161 Hypertensive emergency: Secondary | ICD-10-CM

## 2021-08-11 DIAGNOSIS — I2693 Single subsegmental pulmonary embolism without acute cor pulmonale: Secondary | ICD-10-CM | POA: Diagnosis not present

## 2021-08-11 DIAGNOSIS — Z86718 Personal history of other venous thrombosis and embolism: Secondary | ICD-10-CM

## 2021-08-11 DIAGNOSIS — I69392 Facial weakness following cerebral infarction: Secondary | ICD-10-CM | POA: Diagnosis not present

## 2021-08-11 DIAGNOSIS — I130 Hypertensive heart and chronic kidney disease with heart failure and stage 1 through stage 4 chronic kidney disease, or unspecified chronic kidney disease: Secondary | ICD-10-CM | POA: Diagnosis present

## 2021-08-11 DIAGNOSIS — F1410 Cocaine abuse, uncomplicated: Secondary | ICD-10-CM | POA: Diagnosis present

## 2021-08-11 DIAGNOSIS — I7113 Aneurysm of the descending thoracic aorta, ruptured: Secondary | ICD-10-CM

## 2021-08-11 DIAGNOSIS — J9811 Atelectasis: Secondary | ICD-10-CM | POA: Diagnosis not present

## 2021-08-11 DIAGNOSIS — M25571 Pain in right ankle and joints of right foot: Secondary | ICD-10-CM | POA: Diagnosis not present

## 2021-08-11 DIAGNOSIS — I639 Cerebral infarction, unspecified: Secondary | ICD-10-CM | POA: Diagnosis not present

## 2021-08-11 DIAGNOSIS — Z6829 Body mass index (BMI) 29.0-29.9, adult: Secondary | ICD-10-CM

## 2021-08-11 DIAGNOSIS — K567 Ileus, unspecified: Secondary | ICD-10-CM | POA: Diagnosis present

## 2021-08-11 DIAGNOSIS — J9601 Acute respiratory failure with hypoxia: Secondary | ICD-10-CM | POA: Diagnosis present

## 2021-08-11 DIAGNOSIS — Z7982 Long term (current) use of aspirin: Secondary | ICD-10-CM

## 2021-08-11 DIAGNOSIS — R5381 Other malaise: Secondary | ICD-10-CM | POA: Diagnosis present

## 2021-08-11 DIAGNOSIS — R578 Other shock: Secondary | ICD-10-CM | POA: Diagnosis present

## 2021-08-11 DIAGNOSIS — N390 Urinary tract infection, site not specified: Secondary | ICD-10-CM

## 2021-08-11 DIAGNOSIS — Z95828 Presence of other vascular implants and grafts: Secondary | ICD-10-CM | POA: Diagnosis not present

## 2021-08-11 DIAGNOSIS — D62 Acute posthemorrhagic anemia: Secondary | ICD-10-CM | POA: Diagnosis present

## 2021-08-11 DIAGNOSIS — M549 Dorsalgia, unspecified: Secondary | ICD-10-CM | POA: Diagnosis not present

## 2021-08-11 DIAGNOSIS — E669 Obesity, unspecified: Secondary | ICD-10-CM | POA: Diagnosis present

## 2021-08-11 DIAGNOSIS — D696 Thrombocytopenia, unspecified: Secondary | ICD-10-CM

## 2021-08-11 DIAGNOSIS — R079 Chest pain, unspecified: Secondary | ICD-10-CM | POA: Diagnosis not present

## 2021-08-11 DIAGNOSIS — I2699 Other pulmonary embolism without acute cor pulmonale: Secondary | ICD-10-CM | POA: Diagnosis not present

## 2021-08-11 DIAGNOSIS — I6310 Cerebral infarction due to embolism of unspecified precerebral artery: Secondary | ICD-10-CM | POA: Diagnosis not present

## 2021-08-11 DIAGNOSIS — R159 Full incontinence of feces: Secondary | ICD-10-CM | POA: Diagnosis not present

## 2021-08-11 DIAGNOSIS — Z20822 Contact with and (suspected) exposure to covid-19: Secondary | ICD-10-CM | POA: Diagnosis present

## 2021-08-11 DIAGNOSIS — N184 Chronic kidney disease, stage 4 (severe): Secondary | ICD-10-CM | POA: Diagnosis present

## 2021-08-11 DIAGNOSIS — I718 Aortic aneurysm of unspecified site, ruptured: Secondary | ICD-10-CM

## 2021-08-11 DIAGNOSIS — R410 Disorientation, unspecified: Secondary | ICD-10-CM | POA: Diagnosis not present

## 2021-08-11 DIAGNOSIS — Z9981 Dependence on supplemental oxygen: Secondary | ICD-10-CM

## 2021-08-11 DIAGNOSIS — Z7189 Other specified counseling: Secondary | ICD-10-CM

## 2021-08-11 DIAGNOSIS — N179 Acute kidney failure, unspecified: Secondary | ICD-10-CM | POA: Diagnosis present

## 2021-08-11 DIAGNOSIS — R29810 Facial weakness: Secondary | ICD-10-CM | POA: Diagnosis not present

## 2021-08-11 DIAGNOSIS — D631 Anemia in chronic kidney disease: Secondary | ICD-10-CM | POA: Diagnosis present

## 2021-08-11 DIAGNOSIS — K5900 Constipation, unspecified: Secondary | ICD-10-CM

## 2021-08-11 DIAGNOSIS — Z515 Encounter for palliative care: Secondary | ICD-10-CM | POA: Diagnosis not present

## 2021-08-11 DIAGNOSIS — R0989 Other specified symptoms and signs involving the circulatory and respiratory systems: Secondary | ICD-10-CM

## 2021-08-11 DIAGNOSIS — B9620 Unspecified Escherichia coli [E. coli] as the cause of diseases classified elsewhere: Secondary | ICD-10-CM | POA: Diagnosis not present

## 2021-08-11 DIAGNOSIS — R0602 Shortness of breath: Secondary | ICD-10-CM | POA: Diagnosis not present

## 2021-08-11 DIAGNOSIS — I7100 Dissection of unspecified site of aorta: Secondary | ICD-10-CM | POA: Diagnosis present

## 2021-08-11 DIAGNOSIS — E785 Hyperlipidemia, unspecified: Secondary | ICD-10-CM | POA: Diagnosis present

## 2021-08-11 DIAGNOSIS — I5032 Chronic diastolic (congestive) heart failure: Secondary | ICD-10-CM | POA: Diagnosis present

## 2021-08-11 DIAGNOSIS — I6340 Cerebral infarction due to embolism of unspecified cerebral artery: Secondary | ICD-10-CM | POA: Insufficient documentation

## 2021-08-11 DIAGNOSIS — Z86711 Personal history of pulmonary embolism: Secondary | ICD-10-CM

## 2021-08-11 DIAGNOSIS — F17210 Nicotine dependence, cigarettes, uncomplicated: Secondary | ICD-10-CM | POA: Diagnosis present

## 2021-08-11 DIAGNOSIS — I69351 Hemiplegia and hemiparesis following cerebral infarction affecting right dominant side: Principal | ICD-10-CM

## 2021-08-11 DIAGNOSIS — R609 Edema, unspecified: Secondary | ICD-10-CM | POA: Diagnosis not present

## 2021-08-11 DIAGNOSIS — Z7151 Drug abuse counseling and surveillance of drug abuser: Secondary | ICD-10-CM

## 2021-08-11 DIAGNOSIS — Z79899 Other long term (current) drug therapy: Secondary | ICD-10-CM

## 2021-08-11 DIAGNOSIS — Z8679 Personal history of other diseases of the circulatory system: Secondary | ICD-10-CM

## 2021-08-11 DIAGNOSIS — R531 Weakness: Secondary | ICD-10-CM | POA: Diagnosis not present

## 2021-08-11 DIAGNOSIS — I71012 Dissection of descending thoracic aorta: Secondary | ICD-10-CM | POA: Diagnosis not present

## 2021-08-11 DIAGNOSIS — Z716 Tobacco abuse counseling: Secondary | ICD-10-CM

## 2021-08-11 HISTORY — DX: Presence of other vascular implants and grafts: Z95.828

## 2021-08-11 LAB — BASIC METABOLIC PANEL
Anion gap: 10 (ref 5–15)
BUN: 39 mg/dL — ABNORMAL HIGH (ref 8–23)
CO2: 31 mmol/L (ref 22–32)
Calcium: 8.3 mg/dL — ABNORMAL LOW (ref 8.9–10.3)
Chloride: 98 mmol/L (ref 98–111)
Creatinine, Ser: 2.38 mg/dL — ABNORMAL HIGH (ref 0.61–1.24)
GFR, Estimated: 28 mL/min — ABNORMAL LOW (ref >60)
Glucose, Bld: 97 mg/dL (ref 70–99)
Potassium: 4.1 mmol/L (ref 3.5–5.1)
Sodium: 139 mmol/L (ref 135–145)

## 2021-08-11 LAB — GLUCOSE, CAPILLARY
Glucose-Capillary: 95 mg/dL (ref 70–99)
Glucose-Capillary: 99 mg/dL (ref 70–99)
Glucose-Capillary: 100 mg/dL — ABNORMAL HIGH (ref 70–99)
Glucose-Capillary: 114 mg/dL — ABNORMAL HIGH (ref 70–99)

## 2021-08-11 MED ORDER — ARTIFICIAL TEARS OPHTHALMIC OINT: TOPICAL_OINTMENT | Freq: Every evening | OPHTHALMIC | Status: DC | PRN

## 2021-08-11 MED ORDER — PROCHLORPERAZINE EDISYLATE 10 MG/2ML IJ SOLN: 5.0000 mg | Freq: Four times a day (QID) | INTRAMUSCULAR | Status: DC | PRN

## 2021-08-11 MED ORDER — FLEET ENEMA 7-19 GM/118ML RE ENEM: 1.0000 | ENEMA | Freq: Once | RECTAL | Status: DC | PRN

## 2021-08-11 MED ORDER — PANTOPRAZOLE SODIUM 40 MG PO TBEC: 40.0000 mg | DELAYED_RELEASE_TABLET | Freq: Every day | ORAL | Status: DC

## 2021-08-11 MED ORDER — POLYETHYLENE GLYCOL 3350 17 G PO PACK
17.0000 g | PACK | Freq: Every day | ORAL | Status: DC | PRN
  Administered 2021-08-21: 17 g via ORAL
  Filled 2021-08-11: qty 1

## 2021-08-11 MED ORDER — OXYCODONE HCL 5 MG PO TABS
5.0000 mg | ORAL_TABLET | Freq: Four times a day (QID) | ORAL | Status: DC | PRN
Start: 2021-08-11 — End: 2021-09-07

## 2021-08-11 MED ORDER — BOOST / RESOURCE BREEZE PO LIQD CUSTOM
1.0000 | Freq: Three times a day (TID) | ORAL | Status: DC
  Administered 2021-08-12 – 2021-09-07 (×38): 1 via ORAL

## 2021-08-11 MED ORDER — DIPHENHYDRAMINE HCL 12.5 MG/5ML PO ELIX: 12.5000 mg | ORAL_SOLUTION | Freq: Four times a day (QID) | ORAL | Status: DC | PRN

## 2021-08-11 MED ORDER — BISACODYL 10 MG RE SUPP
10.0000 mg | Freq: Every day | RECTAL | Status: DC | PRN
  Administered 2021-08-22 – 2021-08-30 (×2): 10 mg via RECTAL
  Filled 2021-08-11: qty 1

## 2021-08-11 MED ORDER — BISACODYL 10 MG RE SUPP
10.0000 mg | Freq: Every day | RECTAL | Status: DC
  Administered 2021-08-20 – 2021-09-03 (×3): 10 mg via RECTAL
  Filled 2021-08-11 (×17): qty 1

## 2021-08-11 MED ORDER — THIAMINE HCL 100 MG/ML IJ SOLN: 100.0000 mg | Freq: Every day | INTRAMUSCULAR | Status: DC

## 2021-08-11 MED ORDER — FOLIC ACID 1 MG PO TABS
1.0000 mg | ORAL_TABLET | Freq: Every day | ORAL | Status: DC
  Administered 2021-08-12 – 2021-09-07 (×26): 1 mg via ORAL
  Filled 2021-08-11 (×27): qty 1

## 2021-08-11 MED ORDER — POLYETHYLENE GLYCOL 3350 17 G PO PACK
17.0000 g | PACK | Freq: Two times a day (BID) | ORAL | Status: DC
  Administered 2021-08-11 – 2021-08-15 (×8): 17 g via ORAL
  Filled 2021-08-11 (×8): qty 1

## 2021-08-11 MED ORDER — ACETAMINOPHEN 325 MG PO TABS
325.0000 mg | ORAL_TABLET | ORAL | Status: DC | PRN
  Administered 2021-08-11 – 2021-09-03 (×13): 650 mg via ORAL
  Filled 2021-08-11 (×15): qty 2

## 2021-08-11 MED ORDER — THIAMINE HCL 100 MG PO TABS: 100.0000 mg | ORAL_TABLET | Freq: Every day | ORAL | Status: DC

## 2021-08-11 MED ORDER — FOLIC ACID 1 MG PO TABS: 1.0000 mg | ORAL_TABLET | Freq: Every day | ORAL | Status: DC

## 2021-08-11 MED ORDER — GUAIFENESIN-DM 100-10 MG/5ML PO SYRP: 5.0000 mL | ORAL_SOLUTION | Freq: Four times a day (QID) | ORAL | Status: DC | PRN

## 2021-08-11 MED ORDER — PROCHLORPERAZINE 25 MG RE SUPP: 12.5000 mg | Freq: Four times a day (QID) | RECTAL | Status: DC | PRN

## 2021-08-11 MED ORDER — ADULT MULTIVITAMIN W/MINERALS CH
1.0000 | ORAL_TABLET | Freq: Every day | ORAL | Status: DC
Start: 2021-08-12 — End: 2022-06-02

## 2021-08-11 MED ORDER — DARBEPOETIN ALFA 100 MCG/0.5ML IJ SOSY
100.0000 ug | PREFILLED_SYRINGE | INTRAMUSCULAR | Status: DC
  Administered 2021-08-16 – 2021-08-23 (×2): 100 ug via SUBCUTANEOUS
  Filled 2021-08-11 (×3): qty 0.5

## 2021-08-11 MED ORDER — JUVEN PO PACK
1.0000 | PACK | Freq: Two times a day (BID) | ORAL | Status: DC
  Administered 2021-08-12 – 2021-09-05 (×28): 1 via ORAL
  Filled 2021-08-11 (×26): qty 1

## 2021-08-11 MED ORDER — THIAMINE HCL 100 MG PO TABS
100.0000 mg | ORAL_TABLET | Freq: Every day | ORAL | Status: DC
  Administered 2021-08-12 – 2021-09-07 (×26): 100 mg via ORAL
  Filled 2021-08-11 (×27): qty 1

## 2021-08-11 MED ORDER — PHENOL 1.4 % MT LIQD
1.0000 | OROMUCOSAL | Status: DC | PRN
  Filled 2021-08-11: qty 177

## 2021-08-11 MED ORDER — PANTOPRAZOLE SODIUM 40 MG PO TBEC
40.0000 mg | DELAYED_RELEASE_TABLET | Freq: Every day | ORAL | Status: DC
  Administered 2021-08-12 – 2021-09-07 (×26): 40 mg via ORAL
  Filled 2021-08-11 (×27): qty 1

## 2021-08-11 MED ORDER — TRAZODONE HCL 50 MG PO TABS
25.0000 mg | ORAL_TABLET | Freq: Every evening | ORAL | Status: DC | PRN
  Administered 2021-08-18 – 2021-08-21 (×3): 50 mg via ORAL
  Filled 2021-08-11 (×3): qty 1

## 2021-08-11 MED ORDER — ASCORBIC ACID 500 MG PO TABS
500.0000 mg | ORAL_TABLET | Freq: Two times a day (BID) | ORAL | Status: DC
  Administered 2021-08-11 – 2021-09-07 (×51): 500 mg via ORAL
  Filled 2021-08-11 (×54): qty 1

## 2021-08-11 MED ORDER — PROCHLORPERAZINE MALEATE 5 MG PO TABS: 5.0000 mg | ORAL_TABLET | Freq: Four times a day (QID) | ORAL | Status: DC | PRN

## 2021-08-11 MED ORDER — IPRATROPIUM-ALBUTEROL 0.5-2.5 (3) MG/3ML IN SOLN
3.0000 mL | RESPIRATORY_TRACT | Status: DC | PRN
  Administered 2021-08-20: 3 mL via RESPIRATORY_TRACT
  Filled 2021-08-11 (×2): qty 3

## 2021-08-11 MED ORDER — ALUM & MAG HYDROXIDE-SIMETH 200-200-20 MG/5ML PO SUSP
30.0000 mL | ORAL | Status: DC | PRN
  Administered 2021-08-14 – 2021-08-15 (×3): 30 mL via ORAL
  Filled 2021-08-11 (×3): qty 30

## 2021-08-11 MED ORDER — POLYETHYLENE GLYCOL 3350 17 G PO PACK: 17.0000 g | PACK | Freq: Every day | ORAL | 0 refills | Status: DC

## 2021-08-11 MED ORDER — ADULT MULTIVITAMIN W/MINERALS CH
1.0000 | ORAL_TABLET | Freq: Every day | ORAL | Status: DC
  Administered 2021-08-12 – 2021-09-07 (×26): 1 via ORAL
  Filled 2021-08-11 (×27): qty 1

## 2021-08-11 MED ORDER — ZINC SULFATE 220 (50 ZN) MG PO CAPS
220.0000 mg | ORAL_CAPSULE | Freq: Every day | ORAL | Status: DC
  Administered 2021-08-12 – 2021-09-07 (×26): 220 mg via ORAL
  Filled 2021-08-11 (×27): qty 1

## 2021-08-11 MED ORDER — FUROSEMIDE 40 MG PO TABS: 40.0000 mg | ORAL_TABLET | Freq: Every day | ORAL | Status: DC

## 2021-08-11 MED ORDER — OXYCODONE HCL 5 MG PO TABS
5.0000 mg | ORAL_TABLET | Freq: Four times a day (QID) | ORAL | Status: DC | PRN
  Administered 2021-08-12 – 2021-08-19 (×12): 10 mg via ORAL
  Administered 2021-08-20: 5 mg via ORAL
  Administered 2021-08-20 – 2021-08-21 (×4): 10 mg via ORAL
  Administered 2021-08-22: 5 mg via ORAL
  Administered 2021-08-22 – 2021-08-25 (×4): 10 mg via ORAL
  Administered 2021-08-25: 5 mg via ORAL
  Administered 2021-08-27 – 2021-08-28 (×3): 10 mg via ORAL
  Filled 2021-08-11 (×3): qty 2
  Filled 2021-08-11 (×2): qty 1
  Filled 2021-08-11 (×14): qty 2
  Filled 2021-08-11: qty 1
  Filled 2021-08-11 (×9): qty 2

## 2021-08-11 MED ORDER — ENOXAPARIN SODIUM 30 MG/0.3ML IJ SOSY
30.0000 mg | PREFILLED_SYRINGE | Freq: Every day | INTRAMUSCULAR | Status: DC
  Administered 2021-08-12 – 2021-08-22 (×11): 30 mg via SUBCUTANEOUS
  Filled 2021-08-11 (×11): qty 0.3

## 2021-08-11 MED ORDER — FUROSEMIDE 40 MG PO TABS
40.0000 mg | ORAL_TABLET | Freq: Every day | ORAL | Status: DC
  Administered 2021-08-12 – 2021-09-07 (×26): 40 mg via ORAL
  Filled 2021-08-11 (×27): qty 1

## 2021-08-11 NOTE — Progress Notes (Signed)
Patient arrived on unit, oriented to unit. Reviewed medications, therapy schedule, rehab routine and plan of care. States an understanding of information reviewed. No complications noted at this time. Patient reports no pain and is AX4 Edward Hopkins L [name]  

## 2021-08-11 NOTE — Discharge Summary (Signed)
?Physician Discharge Summary ?  ?Patient: Edward Hopkins MRN: [id number] DOB: 1950-09-18  ?Admit date:     07/27/2021  ?Discharge date: 08/11/21  ?Discharge Physician: Murray Hodgkins  ? ?PCP: Pcp, No  ? ?Recommendations at discharge:  ? ?Ruptured type B aortic aneurysm secondary to hypertensive emergency ?-BP labile but overall stable/low; initiate antihypertensives if needed to control BP, but avoid hypotension given AKI  ?-- repeat CTA until 1 month postop at follow-up in the [facility] office ? ?2. Needs a PCP at discharge-- referred to [facility]. ?  ?3. Acute Hypoxic Respiratory Failure:  ?--wean oxygen, continue oral Lasix ?  ?4. Acute Kidney Injury.  ?--stable, nephrology speculates at new baseline, changed to oral Lasix, nephrology signed off, will follow-up as outpatient ? ?5. Bilateral inguinal hernias (L>R) ?-suggest outpatient follow up with surgery ? ?6. Left ventricular ejection fraction, by estimation, is 60 to 65%. The  ?left ventricle has normal function. The left ventricle demonstrates  ?regional wall motion abnormalities (abnormal basal inferolateral wall  ?motion with preserved thickening) -- consider outpatient follow-up with cardiology ? ?Discharge Diagnoses: ?Principal Problem: ?  Dissection of aorta (Edward Hopkins) ?Active Problems: ?  Acute kidney injury (Edward Hopkins) ?  Cocaine abuse (Edward Hopkins) ?  Shock (Edward Hopkins) ?  Aortic aneurysm rupture (Edward Hopkins) ?  Hypertensive emergency ?  Acute respiratory failure with hypoxia (Edward Hopkins) ?  Ileus (Edward Hopkins) ?  Hemorrhagic shock (Edward Hopkins) ?  Thrombocytopenia (Edward Hopkins) ? ?Resolved Problems: ?  * No resolved hospital problems. * ? ?Hospital Course: ?71 year old man suffered thoracic aortic dissection, was admitted to ICU w/ initial recommendation for medical management but subsequently decompensated, emergent CTA showed extension of dissection and pt underwent emergent operative repair. Postop to ICU w/ gradual improvement, extubation and transfer to hospitalist service. Course complicated by acute kidney injury.  Plan for CIR. ? ?Ruptured type B aortic aneurysm secondary to hypertensive emergency ?--initially managed medically but later on 3/20 developed acute hypotension with extension of his renal dissection requiring emergent surgical repair by vascular via stent grafts.   ?-BP stable, low at times, holding antihypertensive; balance with need for renal perfusion to maintain renal function  ?--abstain from cocaine. Comply with antihypertensives.  ?-- repeat CTA until 1 month postop at follow-up in the [facility] office ?  ?Acute Hypoxic Respiratory Failure:  ?-initially post op failure.  Then complicated by volume overload in the setting of renal failure. Pulmonary edema and bilateral effusions.  ?--wean oxygen, continue oral Lasix ?  ?Acute Kidney Injury  ?--stable, nephrology speculates at new baseline, changed to oral Lasix, nephrology signed off, will follow-up as outpatient ?  ?Ileus  ?-Improved.  Bowel regimen as needed. ?  ?Reported right facial droop and some right lower extremity weakness, subacute.  No known history of CVA ?-CT of the head negative. Previous teams did not pursue further workup, significance of initial report unclear. Subsequent PT notes did not reveal focal deficits ?--no definite focal findings on follow-up exam, no further evaluation suggested ?  ?Cocaine use disorder ?--UDS positive on admission ?--abstain in future ? ?History of hepatitis C ?- Status post treatment, viral load nondetectable ?  ?Transaminitis ?- Resolved ? ?Bilateral inguinal hernias (L>R) ?-needs outpatient follow up with surgery ? ?H/o HCV, completed antiviral therapy, unknown if SVR ?-recheck HCV RNA since he never followed up after completing treatment> level pending ? ?Hemorrhagic shock ?--s/p transfusion 2U PRBC. ? ?Thrombocytopenia  ? ?Left ventricular ejection fraction, by estimation, is 60 to 65%. The  ?left ventricle has normal function. The left ventricle  demonstrates  ?regional wall motion abnormalities (abnormal basal  inferolateral wall  ?motion with preserved thickening). ? ?  ? ? ?Consultants: vascular surgery, critical care, nephrology ?Procedures performed:  ?1.  Ultrasound-guided access right common femoral artery ?2.  Intravascular ultrasound (IVUS) of right iliac, infrarenal abdominal aorta, descending thoracic aorta, aortic arch ?3.  Arch aortogram with abdominal aortogram including catheter selection of aorta ?4.  Repair of descending thoracic dissection with rupture with stent graft without coverage of the left subclavian artery (34 mm x 34 mm x 20 cm Gore graft, 34 mm x 34 mm x 20 cm Gore graft, and 34 mm x 34 mm x 10 cm Gore graft) ?5.  Stent graft of infrarenal abdominal aorta x2 (23 mm x 4.5 cm Gore cuff and 26 mm x 4.5 cm Gore cuff) ? ?Disposition:  CIR ?Diet recommendation:  ?Regular diet ?DISCHARGE MEDICATION: ?Allergies as of 08/11/2021   ?No Known Allergies ?  ? ?  ?Medication List  ?  ? ?STOP taking these medications   ? ?amLODipine 10 MG tablet ?Commonly known as: NORVASC ?  ?aspirin 325 MG EC tablet ?  ?buprenorphine 5 MCG/HR Ptwk ?Commonly known as: BUTRANS ?  ?metoprolol tartrate 25 MG tablet ?Commonly known as: LOPRESSOR ?  ? ?  ? ?TAKE these medications   ? ?folic acid 1 MG tablet ?Commonly known as: FOLVITE ?Take 1 tablet (1 mg total) by mouth daily. ?Start taking on: August 12, 2021 ?  ?furosemide 40 MG tablet ?Commonly known as: LASIX ?Take 1 tablet (40 mg total) by mouth daily. ?Start taking on: August 12, 2021 ?  ?multivitamin with minerals Tabs tablet ?Take 1 tablet by mouth daily. ?Start taking on: August 12, 2021 ?  ?oxyCODONE 5 MG immediate release tablet ?Commonly known as: Oxy IR/ROXICODONE ?Take 1-2 tablets (5-10 mg total) by mouth every 6 (six) hours as needed for severe pain or moderate pain. ?What changed:  ?medication strength ?how much to take ?when to take this ?reasons to take this ?  ?pantoprazole 40 MG tablet ?Commonly known as: PROTONIX ?Take 1 tablet (40 mg total) by mouth  daily. ?Start taking on: August 12, 2021 ?  ?polyethylene glycol 17 g packet ?Commonly known as: MIRALAX / GLYCOLAX ?Take 17 g by mouth daily. ?Start taking on: August 12, 2021 ?  ?thiamine 100 MG tablet ?Take 1 tablet (100 mg total) by mouth daily. ?Start taking on: August 12, 2021 ?  ? ?  ? ? Follow-up Information   ? ? Marty Heck, MD. Schedule an appointment as soon as possible for a visit in 1 month(s).   ?Specialty: Vascular Surgery ?Contact information: ?[address] ?[place] [address] ?[phone] ? ? ?  ?  ? ?  ?  ? ?  ? ?Feels ok ? ?Discharge Exam: ?Filed Weights  ? 08/10/21 0519 08/11/21 0300 08/11/21 0412  ?Weight: (!) 211 kg (!) 209.9 kg (!) 209.9 kg  ? ?Physical Exam ?Vitals reviewed.  ?Constitutional:   ?   General: He is not in acute distress. ?   Appearance: He is not ill-appearing or toxic-appearing.  ?Cardiovascular:  ?   Rate and Rhythm: Normal rate and regular rhythm.  ?   Heart sounds: No murmur heard. ?   Comments: Telemetry SR ?Pulmonary:  ?   Effort: Pulmonary effort is normal. No respiratory distress.  ?   Breath sounds: No wheezing, rhonchi or rales.  ?Musculoskeletal:  ?   Right lower leg: No edema.  ?   Left  lower leg: No edema.  ?Neurological:  ?   General: No focal deficit present.  ?   Mental Status: He is alert.  ?   Comments: Cross bite noted  ?Psychiatric:     ?   Mood and Affect: Mood normal.     ?   Behavior: Behavior normal.  ? ? ? ?Condition at discharge: good ? ?The results of significant diagnostics from this hospitalization (including imaging, microbiology, ancillary and laboratory) are listed below for reference.  ? ?Imaging Studies: ?DG Abd 1 View ? ?Result Date: 08/02/2021 ?CLINICAL DATA:  Aortic aneurysm with dissection Abdominal distension EXAM: ABDOMEN - 1 VIEW COMPARISON:  08/02/2021 FINDINGS: Nasogastric tube terminates in the region of the stomach in the left upper quadrant. Aortic endo grafts are seen.  IVC filter is seen. Diffuse small bowel dilatation is  unchanged from prior examination. Contrast noted within the bladder lumen. Severe bilateral hip osteoarthrosis. IMPRESSION: Diffuse small bowel dilatation most likely due to ileus or obstruction. Electronically [employer]

## 2021-08-11 NOTE — Progress Notes (Signed)
Physical Therapy Treatment ?Patient Details ?Name: Edward Hopkins ?MRN: [id number] ?DOB: 1951/04/24 ?Today's Date: 08/11/2021 ? ? ?History of Present Illness Pt is a 71 y.o. male who presented 07/27/21 with chest and back pain. Pt found to have hypertensive emergency with recurrent acute aortic type B dissection. S/p emergent repair of a ruptured descending thoracic dissection including extension of the dissection into the visceral segment with hemorrhagic shock 3/20. Extubated 3/23. Right facial droop and some right lower extremity weakness, subacute. No known history of CVA  -CT of the head is negative. PMH: HTN, Hep C, polysubstance abuse including ongoing cocaine use, Aortic dissection s/p thoracic aortic graft repair, thoracic aortic aneurysm, CHF, DVT, osteomyelitis, endocarditis. ? ?  ?PT Comments  ? ? Pt received in supine, agreeable to therapy session and with good participation and tolerance for seated/supine LE exercises and bed mobility. Pt needing up to maxA for bed mobility and long sitting in bed using B side rails. Defer standing/gait progression due to lack of +2 assist at time of session, instead emphasis on seated balance tasks and seated scooting in long sit. Pt c/o R wrist pain. Pt continues to benefit from PT services to progress toward functional mobility goals.   ?Recommendations for follow up therapy are one component of a multi-disciplinary discharge planning process, led by the attending physician.  Recommendations may be updated based on patient status, additional functional criteria and insurance authorization. ? ?Follow Up Recommendations ? Acute inpatient rehab (3hours/day) ?  ?  ?Assistance Recommended at Discharge Frequent or constant Supervision/Assistance  ?Patient can return home with the following A lot of help with walking and/or transfers;Two people to help with walking and/or transfers;A lot of help with bathing/dressing/bathroom;Two people to help with  bathing/dressing/bathroom;Assistance with cooking/housework;Direct supervision/assist for medications management;Direct supervision/assist for financial management;Assist for transportation;Help with stairs or ramp for entrance ?  ?Equipment Recommendations ? Rolling walker (2 wheels)  ?  ?Recommendations for Other Services   ? ? ?  ?Precautions / Restrictions Precautions ?Precautions: Fall;Other (comment) ?Precaution Comments: watch 02 ?Restrictions ?Weight Bearing Restrictions: No  ?  ? ?Mobility ? Bed Mobility ?Overal bed mobility: Needs Assistance ?Bed Mobility: Supine to Sit, Sit to Supine ?  ?Sidelying to sit: Max assist, HOB elevated ?  ?Sit to supine: Max assist ?  ?General bed mobility comments: Step by step multimodal cues for sequencing, cues for using B side rails for supine to long sit x3 trials, pt needing maxA for trunk lifting (bed in chair posture and feet area of bed down) ?  ? ?Transfers ?Overall transfer level: Needs assistance ?  ?  ?  ?  ?  ?  ?  ?  ?General transfer comment: lateral seated scooting to L/R sides in long sit and posterior scooting in seated posture with mod to maxA and transfer pad assist ?  ? ?Ambulation/Gait ?  ?  ?  ?  ?  ?  ?  ?General Gait Details: defer standing tasks today due to lack of +2 assist ? ? ? ?  ?Balance Overall balance assessment: Needs assistance ?Sitting-balance support: Feet supported, Bilateral upper extremity supported ?Sitting balance-Leahy Scale: Poor ?Sitting balance - Comments: Pt with posterior lean needing maxA for long sitting in bed ?  ?  ?  ?Standing balance comment: defer for pt/staff safety due to no +2 assist ?  ?  ?  ? ?  ?Cognition Arousal/Alertness: Awake/alert ?Behavior During Therapy: [facility] for tasks assessed/performed ?Overall Cognitive Status: Impaired/Different from baseline ?Area of Impairment:  Following commands, Problem solving, Memory, Attention, Safety/judgement, Awareness ?  ?  ?  ?  ?  ?  ?  ?  ?  ?Current Attention Level:  Focused, Sustained ?Memory: Decreased short-term memory ?Following Commands: Follows one step commands with increased time, Follows multi-step commands inconsistently ?Safety/Judgement: Decreased awareness of safety, Decreased awareness of deficits ?Awareness: Intellectual ?Problem Solving: Slow processing, Difficulty sequencing, Requires verbal cues, Decreased initiation, Requires tactile cues ?General Comments: Decreased initiation, requires step by step cues for sequencing/technique and repetition. Pt with poor safety awareness and benefits from multimodal cues. ?  ?  ? ?  ?Exercises Other Exercises ?Other Exercises: supine BLE AROM: hip flexion, SAQ, hip abduction (AA), ankle pumps, quad sets x20 reps ea ?Other Exercises: supine BUE pulling up on side rails (partial crunch) x5 reps ?Other Exercises: long sitting with BUE support for UE/core strengthening ? ?  ?General Comments General comments (skin integrity, edema, etc.): HR 92 bpm and SpO2 100% on 4L, O2 decreased to 3L/min and reading 96-97% ?  ?  ? ?Pertinent Vitals/Pain Pain Assessment ?Pain Assessment: Faces ?Faces Pain Scale: Hurts little more ?Pain Location: R wrist tender to touch ?Pain Descriptors / Indicators: Grimacing, Tender ?Pain Intervention(s): Monitored during session, Limited activity within patient's tolerance, Repositioned, Other (comment) (RUE elevated on pillow at end of session)  ? ? ? ?PT Goals (current goals can now be found in the care plan section) Acute Rehab PT Goals ?Patient Stated Goal: to feel better ?PT Goal Formulation: With patient/family ?Time For Goal Achievement: 08/19/21 ?Progress towards PT goals: Progressing toward goals ? ?  ?Frequency ? ? ? Min 4X/week ? ? ? ?  ?PT Plan Current plan remains appropriate  ? ? ?   ?AM-PAC PT "6 Clicks" Mobility   ?Outcome Measure ? Help needed turning from your back to your side while in a flat bed without using bedrails?: A Lot ?Help needed moving from lying on your back to sitting on  the side of a flat bed without using bedrails?: A Lot ?Help needed moving to and from a bed to a chair (including a wheelchair)?: A Lot ?Help needed standing up from a chair using your arms (e.g., wheelchair or bedside chair)?: A Lot ?Help needed to walk in hospital room?: A Lot ?Help needed climbing 3-5 steps with a railing? : Total ?6 Click Score: 11 ? ?  ?End of Session Equipment Utilized During Treatment: Oxygen ?Activity Tolerance: Patient tolerated treatment well ?Patient left: with call bell/phone within reach;in bed;with bed alarm set ?Nurse Communication: Mobility status;Other (comment) (weaned to 3L with good tolerance) ?PT Visit Diagnosis: Unsteadiness on feet (R26.81);Muscle weakness (generalized) (M62.81);History of falling (Z91.81);Difficulty in walking, not elsewhere classified (R26.2);Other abnormalities of gait and mobility (R26.89) ?  ? ? ?Time: 1245-8099 ?PT Time Calculation (min) (ACUTE ONLY): 22 min ? ?Charges:  $Therapeutic Exercise: 8-22 mins          ?          ? ?Fannye Myer P., PTA ?Acute Rehabilitation Services ?Secure Chat Preferred 9a-5:30pm ?Office: [phone]  ? ? ?Kara Pacer Rushil Kimbrell ?08/11/2021, 4:21 PM ? ?

## 2021-08-11 NOTE — Progress Notes (Signed)
?Courtney Heys, MD  ?Physician ?CASE MANAGEMENT ?PMR Pre-admission     ?Signed ?Date of Service:  08/07/2021  9:48 AM ? Related encounter: ED to Hosp-Admission (Current) from 07/27/2021 in [facility] 4E CV SURGICAL PROGRESSIVE CARE ?  ?Signed    ?  ?   ?   ?   ?   ?   ?   ?   ?   ?   ?   ?   ?   ?   ?   ?   ?   ?   ?   ?   ?   ?   ?   ?   ?   ?   ?   ?   ?   ?   ?   ?   ?   ?   ?   ?   ?   ?   ?   ?   ?   ?   ?   ?   ?   ?   ?   ?   ?   ?   ?   ?   ?   ?   ?   ?   ?   ?   ?   ?   ?   ?   ?   ?   ?   ?   ?   ?   ?   ?   ?   ?   ?   ?   ?   ?   ?   ?   ?   ?   ?   ?   ?   ?   ?   ?   ?   ?   ?   ?   ?   ?   ?   ?   ?   ?   ?   ?   ?   ?   ?   ?   ?   ?   ?   ?   ?   ?   ?   ?   ?   ?   ?   ?   ?   ?   ?   ?   ?   ?   ?   ?   ?   ?   ?   ?   ?   ?   ?   ?   ?   ?   ?   ?   ?   ?   ?   ?   ?   ?   ?   ?   ?   ?   ?   ?   ?   ?   ?   ?   ?   ?   ?   ?   ?   ?   ?   ?   ?   ?PMR Admission Coordinator Pre-Admission Assessment ?  ?Patient: Edward Hopkins is an 71 y.o., male ?MRN: [id number] ?DOB: Nov 29, 1950 ?Height: '5\' 9"'  (175.3 cm) ?Weight: (!) 209.9 kg ?  ?Insurance Information ?HMO: yes    PPO:      PCP:      IPA:      80/20:      OTHER: Group [id number] ?PRIMARY: [employer] medicare      Policy#: [id number]  Subscriber: self ?CM Name: Wannetta Sender      Phone#: [phone]     Fax#: [phone] ?Pre-Cert#: [id number] approved for 7 days with update due on 08/17/21     Employer:  ?Benefits:  Phone #:      Name:  ?ax: [phone] ?Phone: Online - [email or web address] ?Eff Date: 05/15/2021 - still active ?Deductible: does not have one ?OOP Max: $8,300 ($49.18 met) ?CIR: $1,556/ admission copay ?SNF: $0.00 Copayment per day for days 1-20; $200 Copayment per day for days 21-100; Maximum of 100 days/benefit period ?Outpatient: 80% coverage; 20% co-insurance ?Home Health:  100% coverage, limited by medical necessity ?DME: 80% coverage; 20% co-insurance ?Providers: in network ?  ?SECONDARY: Medicaid [place] access      Policy#: [id number] r     Phone#:  [phone] ?  ?Financial Counselor:        Phone#:   ?  ?The ?Data Collection Information Summary? for patients in Inpatient Rehabilitation Facilities with attached ?Privacy Act [place] Records? was provided and verbally reviewed with: Patient and Family ?  ?Emergency Contact Information ?Contact Information   ?  ?  Name Relation Home Work Mobile  ?  Patterson,[name] Significant other [phone]   [phone]  ?  ?   ?  ?  ?Current Medical History  ?Patient Admitting Diagnosis: Debility, post op repair of thoracic dissection with aortic rupture ?  ?History of Present Illness: This is a 71 y.o. male with history of hypertension, polysubstance abuse including cocaine, known aortic dissection status post ascending aortic replacement including aorto innominate and aorto left carotid bypass by Dr. Cyndia Bent in 2016 that vascular surgery has been consulted for type B dissection with worsening chest pain and hypotension today.  In review of the notes, he was admitted on 07/27/2021 with chest pain and found to have a new type B dissection.  This case was discussed with CT surgery.  He was admitted to the ICU for blood pressure management.  States his chest pain is worse today.  Also dealing with AKI on CKD stage III.  Hypotensive on the floor.  Systolics in the 37'S and responding to fluid. CTA  showed that patient's dissection was ruptured in the chest and he also had extended the dissection throughout the visceral segment with evidence of significant visceral malperfusion.  I discussed the grave prognosis and quoted him a greater than 90% risk of mortality.  Discussed almost certainly will require dialysis postoperative and will be at high risk for gut malperfusion in addition to multisystem organ failure and paralysis.  On 08/01/21 underwent emergent repair of descending type b thoracic dissection with aortic rupture by Dr. Carlis Abbott.  PT/OT evaluations were completed post op with recommendations for acute  inpatient rehab admission. ?  ?  ?Patient's medical record from [facility] has been reviewed by the rehabilitation admission coordinator and physician. ?  ?Past Medical History  ?    ?Past Medical History:  ?Diagnosis Date  ? CHF (congestive heart failure) ([place])    ? Constipation    ? DVT (deep venous thrombosis) ([place]) 1995  ? Endocarditis    ? Hypertension    ? Intramural aortic hematoma ([facility])    ? Osteomyelitis ([place])    ? Right femoral vein DVT ([place]) 07/2014  ? Septic arthritis ([place])    ?  both hips  (08/04/2014)  ?  ?  ?Has the patient had major surgery during 100 days prior to admission? Yes ?  ?Family History   ?family history is not on file. ?  ?  Current Medications ?  ?Current Facility-Administered Medications:  ?  0.9 %  sodium chloride infusion (Manually program via Guardrails IV Fluids), , Intravenous, Once, Byrum, Rose Fillers, MD ?  0.9 %  sodium chloride infusion, 10 mL/hr, Intravenous, Once, Byrum, Rose Fillers, MD ?  acetaminophen (TYLENOL) tablet 650 mg, 650 mg, Oral, Q6H PRN, Collene Gobble, MD, 650 mg at 07/29/21 0536 ?  alum & mag hydroxide-simeth (MAALOX/MYLANTA) 200-200-20 MG/5ML suspension 15-30 mL, 15-30 mL, Oral, Q2H PRN, Byrum, Rose Fillers, MD ?  artificial tears (LACRILUBE) ophthalmic ointment, , Both Eyes, QHS PRN, Collene Gobble, MD, Given at 08/03/21 0002 ?  bisacodyl (DULCOLAX) suppository 10 mg, 10 mg, Rectal, Daily, Collene Gobble, MD, 10 mg at 08/10/21 0901 ?  Darbepoetin Alfa (ARANESP) injection 100 mcg, 100 mcg, Subcutaneous, Q Tue-1800, Corliss Parish, MD, 100 mcg at 08/09/21 1740 ?  docusate sodium (COLACE) capsule 100 mg, 100 mg, Oral, BID PRN, Collene Gobble, MD, 100 mg at 08/04/21 2300 ?  docusate sodium (COLACE) capsule 100 mg, 100 mg, Oral, BID, Collene Gobble, MD, 100 mg at 08/11/21 1610 ?  folic acid (FOLVITE) tablet 1 mg, 1 mg, Oral, Daily, Byrum, Rose Fillers, MD, 1 mg at 08/11/21 9604 ?  furosemide (LASIX) tablet 40 mg, 40 mg, Oral, Daily, Corliss Parish, MD, 40 mg at  08/11/21 5409 ?  guaiFENesin-dextromethorphan (ROBITUSSIN DM) 100-10 MG/5ML syrup 15 mL, 15 mL, Oral, Q4H PRN, Byrum, Rose Fillers, MD ?  heparin injection 5,000 Units, 5,000 Units, Subcutaneous, Q8H, Collene Gobble, MD, 5,000 Units at 08/11/21 0700 ?  hydrALAZINE (APRESOLINE) injection 10 mg, 10 mg, Intravenous, Q4H PRN, Amin, Ankit Chirag, MD ?  insulin aspart (novoLOG) injection 0-15 Units, 0-15 Units, Subcutaneous, TID AC & HS, Byrum, Rose Fillers, MD, 3 Units at 08/10/21 1216 ?  ipratropium-albuterol (DUONEB) 0.5-2.5 (3) MG/3ML nebulizer solution 3 mL, 3 mL, Nebulization, Q4H PRN, Amin, Ankit Chirag, MD ?  magnesium sulfate IVPB 2 g 50 mL, 2 g, Intravenous, Daily PRN, Collene Gobble, MD ?  MEDLINE mouth rinse, 15 mL, Mouth Rinse, BID, Collene Gobble, MD, 15 mL at 08/11/21 8119 ?  metoprolol tartrate (LOPRESSOR) injection 5 mg, 5 mg, Intravenous, Q4H PRN, Amin, Ankit Chirag, MD ?  multivitamin with minerals tablet 1 tablet, 1 tablet, Oral, Daily, Collene Gobble, MD, 1 tablet at 08/11/21 [phone]  ondansetron (ZOFRAN) injection 4 mg, 4 mg, Intravenous, Q6H PRN, Byrum, Rose Fillers, MD ?  oxyCODONE (Oxy IR/ROXICODONE) immediate release tablet 5-10 mg, 5-10 mg, Oral, Q6H PRN, Collene Gobble, MD, 10 mg at 08/11/21 2956 ?  pantoprazole (PROTONIX) EC tablet 40 mg, 40 mg, Oral, Daily, Amin, Ankit Chirag, MD, 40 mg at 08/11/21 2130 ?  phenol (CHLORASEPTIC) mouth spray 1 spray, 1 spray, Mouth/Throat, PRN, Byrum, Rose Fillers, MD ?  polyethylene glycol (MIRALAX / GLYCOLAX) packet 17 g, 17 g, Oral, Daily PRN, Byrum, Rose Fillers, MD ?  polyethylene glycol (MIRALAX / GLYCOLAX) packet 17 g, 17 g, Oral, Daily, Collene Gobble, MD, 17 g at 08/11/21 8657 ?  potassium chloride SA (KLOR-CON M) CR tablet 20-40 mEq, 20-40 mEq, Oral, Daily PRN, Collene Gobble, MD ?  thiamine tablet 100 mg, 100 mg, Oral, Daily, 100 mg at 08/11/21 0924 **OR** thiamine (B-1) injection 100 mg, 100 mg, Intravenous, Daily, Collene Gobble, MD, 100 mg at 08/04/21 8469 ?   traZODone (DESYREL) tablet 50 mg, 50 mg, Oral, QHS PRN, Amin, Ankit Chirag, MD, 50 mg at 08/10/21 2239 ?  ?  Patients Current Diet:  ?Diet Order   ?  ?         ?    Diet heart healthy/carb modified Room service a
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
## 2021-08-11 NOTE — Hospital Course (Signed)
71 year old man suffered thoracic aortic dissection, was admitted to ICU w/ initial recommendation for medical management but subsequently decompensated, emergent CTA showed extension of dissection and pt underwent emergent operative repair. Postop to ICU w/ gradual improvement, extubation and transfer to hospitalist service. Course complicated by acute kidney injury. Plan for CIR. ? ?Ruptured type B aortic aneurysm secondary to hypertensive emergency ?--initially managed medically but later on 3/20 developed acute hypotension with extension of his renal dissection requiring emergent surgical repair by vascular via stent grafts.   ?-BP stable, management per vascular.  ?--abstain from cocaine. Comply with antihypertensives.  ?--Needs a PCP at discharge-- referred to IMTS. ?--continue Coreg, amlodipine ?-- repeat CTA until 1 month postop at follow-up in the VVS office ?  ?Acute Hypoxic Respiratory Failure:  ?-initially post op failure.  Then complicated by volume overload in the setting of renal failure. Pulmonary edema and bilateral effusions.  ?--wean oxygen, continue oral Lasix ?  ?Acute Kidney Injury  ?--stable, nephrology speculates at new baseline, changed to oral Lasix, nephrology signed off, will follow-up as outpatient ?  ?Ileus  ?-Improved.  Bowel regimen as needed. ?  ?Right facial droop and some right lower extremity weakness, subacute.  No known history of CVA ?-CT of the head negative. Previous teams did not pursue further workfup ?--no definite focal findings today ?  ?Cocaine use disorder ?--UDS positive on admission ?--abstain in future ? ?History of hepatitis C ?- Status post treatment, viral load nondetectable ?  ?Transaminitis ?- Resolved ? ?Bilateral inguinal hernias (L>R) ?-needs outpatient follow up with surgery ? ?H/o HCV, completed antiviral therapy, unknown if SVR ?-recheck HCV RNA since he never followed up after completing treatment> level pending ? ?Hemorrhagic shock ?--s/p transfusion 2U  PRBC. ? ?Thrombocytopenia  ? ?Left ventricular ejection fraction, by estimation, is 60 to 65%. The  ?left ventricle has normal function. The left ventricle demonstrates  ?regional wall motion abnormalities (abnormal basal inferolateral wall  ?motion with preserved thickening). ?

## 2021-08-11 NOTE — Progress Notes (Addendum)
[facility] ? ?Subjective  - no new complaints this am ? ? ?Objective ?(!) 165/80 ?89 ?98.6 ?F (37 ?C) (Oral) ?17 ?92% ? ?Intake/Output Summary (Last 24 hours) at 08/11/2021 0909 ?Last data filed at 08/10/2021 2000 ?Gross per 24 hour  ?Intake 460 ml  ?Output 1150 ml  ?Net -690 ml  ? ?Abdomin NTTP ?Right groin stick site clean and dry without hematoma ?Feet well perfused with palpable DP pulses B ? ? ? ?Assessment/Planning: ? 08/01/21 ?Status post repair of ruptured descending thoracic dissection with hemorraghic shock Emergently by Dr. Carlis Abbott ? ?Cr stable without change and cont. To have good urine Op > 1900.  Nephrology will follow as OP. ?HGB stable after blood transfusion on 08/01/21 of 4 units PRBC.   ?Good inflow with movement of all 4 ext.  No complaint of thoracic pain or CP.  Stable disposition from a vascular point of view.  Plan for repeat CTA at 1 month f/u with DR. Kalasia Crafton.  ? ? ?Roxy Horseman ?08/11/2021 ?9:09 AM ?-- ? ?Laboratory ?Lab Results: ?Recent Labs  ?  08/09/21 ?0222 08/10/21 ?0154  ?WBC 7.9 7.5  ?HGB 9.1* 10.0*  ?HCT 28.5* 31.5*  ?PLT PLATELET CLUMPS NOTED ON SMEAR, UNABLE TO ESTIMATE 154  ? ?BMET ?Recent Labs  ?  08/10/21 ?0154 08/11/21 ?0133  ?NA 138 139  ?K 4.8 4.1  ?CL 101 98  ?CO2 24 31  ?GLUCOSE 98 97  ?BUN 46* 39*  ?CREATININE 2.61* 2.38*  ?CALCIUM 8.3* 8.3*  ? ? ?COAG ?Lab Results  ?Component Value Date  ? INR 1.7 (H) 08/01/2021  ? INR 1.6 (H) 08/01/2021  ? INR 1.1 07/27/2021  ? ?No results found for: PTT ? ?I have seen and evaluated the patient. I agree with the PA note as documented above. Status post repair of ruptured descending thoracic dissection on 08/01/21 with hemorraghic shock.  Continues to have palpable pedal pulses.  Creatinine continues to improve with AKI in the setting of CKD.  Cr 2.38 today.  We will likely delay repeat CTA until 1 month postop at follow-up in the office given AKI on CKD that is improving and give him the best chance for  renal recovery.  Awaiting CIR appeal per the notes.  ? ?Marty Heck, MD ?[facility] ?Office: [phone] ? ? ? ?

## 2021-08-11 NOTE — TOC Transition Note (Signed)
Transition of Care (TOC) - CM/SW Discharge Note ?Marvetta Gibbons Therapist, sports, BSN ?Transitions of Care ?Unit 4E- RN Case Manager ?See Treatment Team for direct phone #  ? ? ?Patient Details  ?Name: Edward Hopkins ?MRN: [id number] ?Date of Birth: Oct 26, 1950 ? ?Transition of Care (TOC) CM/SW Contact:  ?Dahlia Client, Romeo Rabon, RN ?Phone Number: ?08/11/2021, 1:23 PM ? ? ?Clinical Narrative:    ?Notified by [facility]  that pt has won appeal and has been approved by insurance for [facility] rehab- bed available today for admission.  ?MD has been notified and pt cleared for transition to [facility] rehab.  ? ?Pt to transition later this afternoon to [facility] rehab ? ? ?Final next level of care: [place] ?Barriers to Discharge: Barriers Resolved ? ? ?Patient Goals and CMS Choice ?Patient states their goals for this hospitalization and ongoing recovery are:: rehab ?CMS Medicare.gov Compare Post Acute Care list provided to:: Patient ?Choice offered to / list presented to : Patient ? ?Discharge Placement ?  ?           ?  ? [facility] rehab ?  ?  ? ?Discharge Plan and Services ?  ?Discharge Planning Services: CM Consult ?Post Acute Care Choice: IP Rehab          ?DME Arranged: N/A ?DME Agency: NA ?  ?  ?  ?HH Arranged: NA ?[place] Agency: NA ?  ?  ?  ? ?Social Determinants of Health (SDOH) Interventions ?  ? ? ?Readmission Risk Interventions ? ?  08/11/2021  ?  1:23 PM  ?Readmission Risk Prevention Plan  ?Transportation Screening Complete  ?PCP or Specialist Appt within 3-5 Days Complete  ?[place] or Home Care Consult Complete  ?Social Work Consult for [place] Planning/Counseling Complete  ?Palliative Care Screening Not Applicable  ?Medication Review Press photographer) Complete  ? ? ? ? ? ?
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
## 2021-08-12 ENCOUNTER — Inpatient Hospital Stay (HOSPITAL_COMMUNITY): Payer: Medicare Other

## 2021-08-12 DIAGNOSIS — I7100 Dissection of unspecified site of aorta: Secondary | ICD-10-CM | POA: Diagnosis not present

## 2021-08-12 DIAGNOSIS — R609 Edema, unspecified: Secondary | ICD-10-CM

## 2021-08-12 LAB — GLUCOSE, CAPILLARY
Glucose-Capillary: 99 mg/dL (ref 70–99)
Glucose-Capillary: 105 mg/dL — ABNORMAL HIGH (ref 70–99)
Glucose-Capillary: 93 mg/dL (ref 70–99)
Glucose-Capillary: 144 mg/dL — ABNORMAL HIGH (ref 70–99)

## 2021-08-12 LAB — COMPREHENSIVE METABOLIC PANEL
ALT: 13 U/L (ref 0–44)
AST: 23 U/L (ref 15–41)
Albumin: 2 g/dL — ABNORMAL LOW (ref 3.5–5.0)
Alkaline Phosphatase: 40 U/L (ref 38–126)
Anion gap: 6 (ref 5–15)
BUN: 33 mg/dL — ABNORMAL HIGH (ref 8–23)
CO2: 33 mmol/L — ABNORMAL HIGH (ref 22–32)
Calcium: 8.2 mg/dL — ABNORMAL LOW (ref 8.9–10.3)
Chloride: 97 mmol/L — ABNORMAL LOW (ref 98–111)
Creatinine, Ser: 2.35 mg/dL — ABNORMAL HIGH (ref 0.61–1.24)
GFR, Estimated: 29 mL/min — ABNORMAL LOW (ref >60)
Glucose, Bld: 105 mg/dL — ABNORMAL HIGH (ref 70–99)
Potassium: 3.9 mmol/L (ref 3.5–5.1)
Sodium: 136 mmol/L (ref 135–145)
Total Bilirubin: 1.8 mg/dL — ABNORMAL HIGH (ref 0.3–1.2)
Total Protein: 6.7 g/dL (ref 6.5–8.1)

## 2021-08-12 LAB — CBC WITH DIFFERENTIAL/PLATELET
Abs Immature Granulocytes: 0.02 10*3/uL (ref 0.00–0.07)
Basophils Absolute: 0 10*3/uL (ref 0.0–0.1)
Basophils Relative: 0 %
Eosinophils Absolute: 0.1 10*3/uL (ref 0.0–0.5)
Eosinophils Relative: 3 %
HCT: 26.7 % — ABNORMAL LOW (ref 39.0–52.0)
Hemoglobin: 8.7 g/dL — ABNORMAL LOW (ref 13.0–17.0)
Immature Granulocytes: 0 %
Lymphocytes Relative: 27 %
Lymphs Abs: 1.4 10*3/uL (ref 0.7–4.0)
MCH: 28.3 pg (ref 26.0–34.0)
MCHC: 32.6 g/dL (ref 30.0–36.0)
MCV: 87 fL (ref 80.0–100.0)
Monocytes Absolute: 0.6 10*3/uL (ref 0.1–1.0)
Monocytes Relative: 12 %
Neutro Abs: 3 10*3/uL (ref 1.7–7.7)
Neutrophils Relative %: 58 %
Platelets: 194 10*3/uL (ref 150–400)
RBC: 3.07 MIL/uL — ABNORMAL LOW (ref 4.22–5.81)
RDW: 15.5 % (ref 11.5–15.5)
WBC: 5.1 10*3/uL (ref 4.0–10.5)
nRBC: 0 % (ref 0.0–0.2)

## 2021-08-12 MED ORDER — METOCLOPRAMIDE HCL 5 MG PO TABS
5.0000 mg | ORAL_TABLET | Freq: Three times a day (TID) | ORAL | Status: DC
  Administered 2021-08-12 – 2021-08-15 (×10): 5 mg via ORAL
  Filled 2021-08-12 (×10): qty 1

## 2021-08-12 NOTE — Progress Notes (Signed)
Inpatient Rehabilitation Admission Medication Review by a Pharmacist ? ?A complete drug regimen review was completed for this patient to identify any potential clinically significant medication issues. ? ?High Risk Drug Classes Is patient taking? Indication by Medication  ?Antipsychotic Yes Compazine prn N/V  ?Anticoagulant Yes Lovenox for VTE ppx  ?Antibiotic No   ?Opioid Yes Oxycodone prn for pain  ?Antiplatelet No   ?Hypoglycemics/insulin No   ?Vasoactive Medication Yes Furosemide for fluid  ?Chemotherapy No   ?Other Yes Aranesp for anemia ?Protonix for GERD  ? ? ? ?Type of Medication Issue Identified Description of Issue Recommendation(s)  ?Drug Interaction(s) (clinically significant) ?    ?Duplicate Therapy ?    ?Allergy ?    ?No Medication Administration End Date ?    ?Incorrect Dose ?    ?Additional Drug Therapy Needed ?    ?Significant med changes from prior encounter (inform family/care partners about these prior to discharge). Metoprolol and amlodipine currently on hold Resume if and when appropriate  ?Other ?    ? ? ?Clinically significant medication issues were identified that warrant physician communication and completion of prescribed/recommended actions by midnight of the next day:  No ? ? ?Time spent performing this drug regimen review (minutes):  20 minutes ? ? ?Tad Moore ?08/12/2021 8:40 AM ?
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
## 2021-08-12 NOTE — Progress Notes (Signed)
Right upper extremity venous duplex completed. ?Refer to "CV Proc" under chart review to view preliminary results. ? ?08/12/2021 6:44 PM ?Kelby Aline., MHA, RVT, RDCS, RDMS   ?

## 2021-08-12 NOTE — Progress Notes (Addendum)
Occupational Therapy Note ? ?Patient Details  ?Name: Edward Hopkins ?MRN: [id number] ?Date of Birth: 1950-06-12 ? ?Today's Date: 08/12/2021 ?OT Missed Time: 60 Minutes ?Missed Time Reason: Patient fatigue ? ?Pt missed 60 mins skilled OT services. Pt states he is exhausted from morning therapies and requested that OTA check back later. On return, pt off unit for test.  ? ?[name] ?08/12/2021, 1:45 PM ?

## 2021-08-12 NOTE — Progress Notes (Signed)
?                                                       PROGRESS NOTE ? ? ?Subjective/Complaints: ? ?Pt reports not real hungry, but tried to eat- ate a few bits of banana- all his sausage and a few bites of oatmeal- stomach still hurts.  ? ?Still has likely ileus, base don f/u KUB last night, which shows distended lops of small and large bowel-  ?Also RUE very sensitive to touch.  ? ?Objective: ?  ?DG Abd 1 View ? ?Result Date: 08/11/2021 ?CLINICAL DATA:  Left lower quadrant pain/ileus EXAM: ABDOMEN - 1 VIEW COMPARISON:  X-ray abdomen 08/05/2021 FINDINGS: Likely improved gaseous abdomen with loops of small and large bowel distended with gas. Thoracic and abdominal aortic stent. Inferior vena cava filter. No radio-opaque calculi or other significant radiographic abnormality are seen. Severe degenerative changes of bilateral hips. IMPRESSION: Likely improved gaseous abdomen with loops of small and large bowel distended with gas. Electronically Signed   By: Iven Finn M.D.   On: 08/11/2021 20:20   ?Recent Labs  ?  08/10/21 ?0154 08/12/21 ?0631  ?WBC 7.5 5.1  ?HGB 10.0* 8.7*  ?HCT 31.5* 26.7*  ?PLT 154 194  ? ?Recent Labs  ?  08/11/21 ?0133 08/12/21 ?0631  ?NA 139 136  ?K 4.1 3.9  ?CL 98 97*  ?CO2 31 33*  ?GLUCOSE 97 105*  ?BUN 39* 33*  ?CREATININE 2.38* 2.35*  ?CALCIUM 8.3* 8.2*  ? ? ?Intake/Output Summary (Last 24 hours) at 08/12/2021 1102 ?Last data filed at 08/12/2021 2355 ?Gross per 24 hour  ?Intake 80 ml  ?Output 600 ml  ?Net -520 ml  ?  ? ?  ? ?Physical Exam: ?Vital Signs ?Blood pressure 121/74, pulse 94, temperature 98.7 ?F (37.1 ?C), temperature source Oral, resp. rate 18, height 5\' 8"  (1.727 m), weight 89.5 kg, SpO2 100 %. ? ? ? ?General: awake, alert, appropriate,  mildly lethargic, but not as bad as yesterday- pushing food around on his plate; sitting up in bed; NAD ?HENT: conjugate gaze; oropharynx moist- O2 by [address] ?CV: regular rate; no JVD ?Pulmonary: CTA B/L; no W/R/R- decreased at bases B/L ?GI: soft,  NT, still somewhat distended; not TTP today; hypoactive BS ?Psychiatric: appropriate ?Neurological: alert, more interactive ?Musculoskeletal:  ?   Cervical back: Neck supple. No tenderness.  ?   Comments: 2+ pedal edema right. RUE with 1+ edema forearm and hand.  ?  ?LUE 5/5 ?RUE 4+/5 proximal and 5-5/ distally ?LLE- 5/5 ?RLE- 5-/5- weaker on R side  ?Skin: ?   Comments: Right fore arm with two ares of skin necrosis?--area on forearm painful with surrounding erythema.  ?Also R groin glued incision ?2+ LE edema to mid calf and 1-2+ RUE swelling  ?Neurological:  ?   Mental Status: He is oriented to person, place, and time.  ?   Comments: Left facial weakness. Lethargic and tended to drift off. Internally distracted and needed cues to follow simple motor commands. He was able to use calender independently to recall date. Able to recall age and DOB.   ?Sleepy, but Ox3- knew at [facility] cone and why; and month/year ?Sensation intact in all 4 extremities  ?Assessment/Plan: ?1. Functional deficits which require 3+ hours per day of interdisciplinary therapy in a comprehensive inpatient rehab  setting. ?Physiatrist is providing close team supervision and 24 hour management of active medical problems listed below. ?Physiatrist and rehab team continue to assess barriers to discharge/monitor patient progress toward functional and medical goals ? ?Care Tool: ? ?Bathing ?   ?   ?   ?  ?  ?Bathing assist   ?  ?  ?Upper Body Dressing/Undressing ?Upper body dressing   ?  ?   ?Upper body assist   ?   ?Lower Body Dressing/Undressing ?Lower body dressing ? ? ?   ?  ? ?  ? ?Lower body assist   ?   ? ?Toileting ?Toileting    ?Toileting assist Assist for toileting: Moderate Assistance - Patient 50 - 74% ?  ?  ?Transfers ?Chair/bed transfer ? ?Transfers assist ?   ? ?  ?  ?  ?Locomotion ?Ambulation ? ? ?Ambulation assist ? ?   ? ?  ?  ?   ? ?Walk 10 feet activity ? ? ?Assist ?   ? ?  ?   ? ?Walk 50 feet activity ? ? ?Assist   ? ?  ?    ? ? ?Walk 150 feet activity ? ? ?Assist   ? ?  ?  ?  ? ?Walk 10 feet on uneven surface  ?activity ? ? ?Assist   ? ? ?  ?   ? ?Wheelchair ? ? ? ? ?Assist   ?  ?  ? ?  ?   ? ? ?Wheelchair 50 feet with 2 turns activity ? ? ? ?Assist ? ?  ?  ? ? ?   ? ?Wheelchair 150 feet activity  ? ? ? ?Assist ?   ? ? ?   ? ?Blood pressure 121/74, pulse 94, temperature 98.7 ?F (37.1 ?C), temperature source Oral, resp. rate 18, height 5\' 8"  (1.727 m), weight 89.5 kg, SpO2 100 %. ? ?Medical Problem List and Plan: ?1. Functional deficits secondary to debility from  thoracic dissection of aneurysm  ?            -patient may  shower ?            -ELOS/Goals: 12-14 days min A to supervision ? First day of evaluations- con't CIR_ PT and OT and SLP? ?2.  Antithrombotics: ?-DVT/anticoagulation:  Pharmaceutical: Lovenox. Will order dopplers of RUE.  ?            -antiplatelet therapy: N/A ?3. Pain Management:  oxycodone prn  ?4. Mood: LCSW to follow for evaluation and support.  ?            -antipsychotic agents: N/A ?5. Neuropsych: This patient may be intermittently capable of making decisions on his own behalf. ?6. Skin/Wound Care: Routine pressure relief measures.  Monitor areas on right forearm/wrist for demarcation ?            -will add Vitamin C and Zinc to promote healing.  ?7. Fluids/Electrolytes/Nutrition: Monitor I/O. Check lytes in am.  ?8. Ruptured  descending thoracic aneurysm s/p repair: BP management.  ?            --will need repeat CTA 4 weeks (monitoring SCr  for improvement) ?9. Acute on chronic renal failure: with new CKD Stage IV Improvement in BUN/SCr- 39/2.38 ?            --monitor with serial checks. Avoid nephrotoxic medications.  ? 3/31- Cr and BUN stable con't to monitor weekly.  ?10. Acute blood loss anemia: Recheck CBC in am.  ?  11. Hypoxia: Wean oxygen as tolerated. Monitor for signs of overload ?            --Off diuresis. Resp rate increase with minimal movement- will monitor O2 sats.  ? 3/31- monitor closely,  since  SOB with minimal exertion.  ?12. ABLA: Improving with Epogen and thrombocytopenia has resolved.   ? 3/31- Hb 8.7- stable to slightly down- was 9.1 a few days ago- ?13. Polysubstance abuse: Educated on importance of cessation ?            --smokes > 1 PPD and marijuana/cocaine-->Has planned to quit.  ?14. Obesity: BMI 30.- initially documented as BMI of 68- was wrong- Educate on diet and importance of weight loss to help promote mobility and overall health.  ?15. Ileus: Has been refusing daily suppository--last BM on 03/25.  ?--Intake poor--0-50% in the past 24 hours with abdominal distension. ?--Will increase Miralax to bid.Check KUB for follow up.  ?3/31- KUB looks better, but still distended loops of bowel- not eating much- will start Reglan 5 mg TID for the moment and see if things improve- might need f/u KUB this weekend- monitor for nausea. Also check KUB in AM ?16. R hemiparesis with R facial droop- head CT already done- will assess if can get brain MRI ? 3/31- will ask vascular since had stent- and se eif can get MRI ?17. RUE swelling- vascular ulcers/tissue necrosis? ? 3/31- with pain/sensitivity of RUE and tissue necrosis possibly, calling vascular to see additional w/u of RUE ?  ? ?I spent a total of  52  minutes on total care today- >50% coordination of care- due to d/w PA about vascular and needing brain MRI- also ileus and f/u on that- also spoke with Dr Letta Pate for weekend about pt. Also calling vascular about possible tissue necrosis of RUE ? ? ?LOS: ?1 days ?A FACE TO FACE EVALUATION WAS PERFORMED ? ?Jody Silas ?08/12/2021, 11:02 AM  ? ? ? ?

## 2021-08-12 NOTE — Progress Notes (Signed)
[facility] ? ?Subjective  - seen in rehab. ? ? ?Objective ?121/74 ?94 ?98.7 ?F (37.1 ?C) (Oral) ?18 ?100% ? ?Intake/Output Summary (Last 24 hours) at 08/12/2021 1218 ?Last data filed at 08/12/2021 7371 ?Gross per 24 hour  ?Intake 80 ml  ?Output 600 ml  ?Net -520 ml  ? ?Several areas of skin breakdown right forearm ?Bilateral radial pulses palpable ?Bilateral DP pulses palpable ? ?Laboratory ?Lab Results: ?Recent Labs  ?  08/10/21 ?0154 08/12/21 ?0631  ?WBC 7.5 5.1  ?HGB 10.0* 8.7*  ?HCT 31.5* 26.7*  ?PLT 154 194  ? ?BMET ?Recent Labs  ?  08/11/21 ?0133 08/12/21 ?0631  ?NA 139 136  ?K 4.1 3.9  ?CL 98 97*  ?CO2 31 33*  ?GLUCOSE 97 105*  ?BUN 39* 33*  ?CREATININE 2.38* 2.35*  ?CALCIUM 8.3* 8.2*  ? ? ?COAG ?Lab Results  ?Component Value Date  ? INR 1.7 (H) 08/01/2021  ? INR 1.6 (H) 08/01/2021  ? INR 1.1 07/27/2021  ? ?No results found for: PTT ? ?Assessment/Planning: ? ?Status post repair of ruptured descending thoracic dissection on 08/01/21 with hemorraghic shock.  Now in CIR.  Continues to have palpable pedal pulses.  Creatinine continues to improve with AKI in the setting of CKD.  Cr 2.35 today.  We will likely delay repeat CTA until 1 month postop at follow-up in the office given AKI on CKD that is improving and give him the best chance for renal recovery.  ? ?Asked to evaluate areas of skin changes to right forearm with skin breakdown and focal necrosis.  Palpable radial pulses bilateral upper extremities.  Wound ask wound care to evaluate.  Xeroform would be ok from my standpoint otherwise.  Not sure if this is old iv sites. ? ?Marty Heck ?08/12/2021 ?12:18 PM ?-- ? ? ?
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
## 2021-08-12 NOTE — Progress Notes (Signed)
Late note from 03/31: Vascular consulted to evaluate ischemic areas on RUE as well as question re: MRI brain for work up due to he continues to exhibit right sided weakness.  Also has had some confusion/disorientation at times. He was cleared for MRI brain which showed 2 small left frontal acute/subacute ischemia. Will contact neurology for input on work up--likely embolic. WOC consulted for input on ares on right forearm.  ?
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
## 2021-08-12 NOTE — Progress Notes (Signed)
Inpatient Rehabilitation  Patient information reviewed and entered into eRehab system by [place] Luppino Fendi Meinhardt, OTR/L.   Information including medical coding, functional ability and quality indicators will be reviewed and updated through discharge.    

## 2021-08-13 ENCOUNTER — Inpatient Hospital Stay (HOSPITAL_COMMUNITY): Payer: Medicare Other

## 2021-08-13 DIAGNOSIS — I7100 Dissection of unspecified site of aorta: Secondary | ICD-10-CM | POA: Diagnosis not present

## 2021-08-13 LAB — GLUCOSE, CAPILLARY
Glucose-Capillary: 118 mg/dL — ABNORMAL HIGH (ref 70–99)
Glucose-Capillary: 95 mg/dL (ref 70–99)
Glucose-Capillary: 99 mg/dL (ref 70–99)

## 2021-08-13 MED ORDER — SIMETHICONE 80 MG PO CHEW
80.0000 mg | CHEWABLE_TABLET | Freq: Four times a day (QID) | ORAL | Status: DC
  Administered 2021-08-13 – 2021-09-07 (×92): 80 mg via ORAL
  Filled 2021-08-13 (×95): qty 1

## 2021-08-13 NOTE — Plan of Care (Signed)
?  Problem: RH Balance ?Goal: LTG Patient will maintain dynamic sitting balance (PT) ?Description: LTG:  Patient will maintain dynamic sitting balance with assistance during mobility activities (PT) ?Flowsheets (Taken 08/12/2021 1727) ?LTG: Pt will maintain dynamic sitting balance during mobility activities with:: Supervision/Verbal cueing ?Goal: LTG Patient will maintain dynamic standing balance (PT) ?Description: LTG:  Patient will maintain dynamic standing balance with assistance during mobility activities (PT) ?Flowsheets (Taken 08/12/2021 1727) ?LTG: Pt will maintain dynamic standing balance during mobility activities with:: Contact Guard/Touching assist ?  ?Problem: Sit to Stand ?Goal: LTG:  Patient will perform sit to stand with assistance level (PT) ?Description: LTG:  Patient will perform sit to stand with assistance level (PT) ?Flowsheets (Taken 08/12/2021 1727) ?LTG: PT will perform sit to stand in preparation for functional mobility with assistance level: Contact Guard/Touching assist ?  ?Problem: RH Bed Mobility ?Goal: LTG Patient will perform bed mobility with assist (PT) ?Description: LTG: Patient will perform bed mobility with assistance, with/without cues (PT). ?Flowsheets (Taken 08/12/2021 1727) ?LTG: Pt will perform bed mobility with assistance level of: Contact Guard/Touching assist ?  ?Problem: RH Car Transfers ?Goal: LTG Patient will perform car transfers with assist (PT) ?Description: LTG: Patient will perform car transfers with assistance (PT). ?Flowsheets (Taken 08/12/2021 1727) ?LTG: Pt will perform car transfers with assist:: Minimal Assistance - Patient > 75% ?  ?Problem: RH Furniture Transfers ?Goal: LTG Patient will perform furniture transfers w/assist (OT/PT) ?Description: LTG: Patient will perform furniture transfers  with assistance (OT/PT). ?Flowsheets (Taken 08/12/2021 1727) ?LTG: Pt will perform furniture transfers with assist:: Minimal Assistance - Patient > 75% ?  ?Problem: RH  Ambulation ?Goal: LTG Patient will ambulate in controlled environment (PT) ?Description: LTG: Patient will ambulate in a controlled environment, # of feet with assistance (PT). ?Flowsheets (Taken 08/12/2021 1727) ?LTG: Pt will ambulate in controlled environ  assist needed:: Minimal Assistance - Patient > 75% ?LTG: Ambulation distance in controlled environment: at least 75 ft with LRAD ?Goal: LTG Patient will ambulate in home environment (PT) ?Description: LTG: Patient will ambulate in home environment, # of feet with assistance (PT). ?Flowsheets (Taken 08/12/2021 1727) ?LTG: Pt will ambulate in home environ  assist needed:: Minimal Assistance - Patient > 75% ?LTG: Ambulation distance in home environment: up to 50 ft using LRAD ?  ?Problem: RH Wheelchair Mobility ?Goal: LTG Patient will propel w/c in home environment (PT) ?Description: LTG: Patient will propel wheelchair in home environment, # of feet with assistance (PT). ?Flowsheets (Taken 08/12/2021 1727) ?LTG: Pt will propel w/c in home environ  assist needed:: Supervision/Verbal cueing ?Distance: wheelchair distance in controlled environment: 100 ?LTG: Propel w/c distance in home environment: 50 ft ?  ?Problem: RH Stairs ?Goal: LTG Patient will ambulate up and down stairs w/assist (PT) ?Description: LTG: Patient will ambulate up and down # of stairs with assistance (PT) ?Flowsheets (Taken 08/13/2021 0327) ?LTG: Pt will ambulate up/down stairs assist needed:: Minimal Assistance - Patient > 75% ?LTG: Pt will  ambulate up and down number of stairs: at least 1 step as per home environment in order to enter/ exit home and mobilize in community ?  ?

## 2021-08-13 NOTE — Progress Notes (Signed)
?                                                       PROGRESS NOTE ? ? ?Subjective/Complaints: ? ?Feels like gas building up again , reviewed KUB from 2 d ago ?No abd pain no vomiting just poor appetite  ? ?ROS- neg CP, SOB, N/V/D ? ?Objective: ?  ?DG Abd 1 View ? ?Result Date: 08/11/2021 ?CLINICAL DATA:  Left lower quadrant pain/ileus EXAM: ABDOMEN - 1 VIEW COMPARISON:  X-ray abdomen 08/05/2021 FINDINGS: Likely improved gaseous abdomen with loops of small and large bowel distended with gas. Thoracic and abdominal aortic stent. Inferior vena cava filter. No radio-opaque calculi or other significant radiographic abnormality are seen. Severe degenerative changes of bilateral hips. IMPRESSION: Likely improved gaseous abdomen with loops of small and large bowel distended with gas. Electronically Signed   By: Iven Finn M.D.   On: 08/11/2021 20:20  ? ?MR BRAIN WO CONTRAST ? ?Result Date: 08/12/2021 ?CLINICAL DATA:  Neuro deficit, acute, stroke suspected. EXAM: MRI HEAD WITHOUT CONTRAST TECHNIQUE: Multiplanar, multiecho pulse sequences of the brain and surrounding structures were obtained without intravenous contrast. COMPARISON:  Head CT August 05, 2021. FINDINGS: Brain: One small focus of restricted diffusion in the inferior left cerebellar hemisphere and 2 in the deep white matter of the left frontal lobe, consistent with acute/subacute infarcts remote lacunar infarct in the left basal ganglia. Scattered and confluent foci of T2 hyperintensity are seen within the white matter of the cerebral hemispheres, nonspecific, most likely related to chronic small vessel ischemia. No hemorrhage, hydrocephalus, extra-axial collection or mass lesion. Vascular: Normal flow voids. Skull and upper cervical spine: Normal marrow signal. Sinuses/Orbits: No acute findings. Other: None. IMPRESSION: 1. Three small foci of restricted diffusion consistent with acute/subacute infarcts, 2 in the left frontal lobe and 1 in the cerebellum,  suggesting embolic event. 2. Moderate chronic microvascular ischemic changes of the white matter. Electronically Signed   By: Pedro Earls M.D.   On: 08/12/2021 15:28  ? ?DG Abd Portable 1V ? ?Result Date: 08/13/2021 ?CLINICAL DATA:  Lower abdominal pain and distension. EXAM: PORTABLE ABDOMEN - 1 VIEW COMPARISON:  08/11/2021 FINDINGS: Signs of previous stent graft repair of the thoracic and proximal abdominal aorta. IVC filter is identified. Stent is noted within the infrarenal abdominal aorta. Bowel gas pattern is nonobstructed. No dilated loops of small bowel. Colonic distension appears improved from previous exam. IMPRESSION: Improving gaseous distension of the colon. Electronically Signed   By: Kerby Moors M.D.   On: 08/13/2021 10:14  ? ?VAS [place] UPPER EXTREMITY VENOUS DUPLEX ? ?Result Date: 08/13/2021 ?UPPER VENOUS STUDY  Patient Name:  Edward Hopkins  Date of Exam:   08/12/2021 Medical Rec #: [id number]      Accession #:    [id number] Date of Birth: 29-Nov-1950      Patient Gender: M Patient Age:   71 years Exam [facility]:  [facility] Procedure:      VAS [place] UPPER EXTREMITY VENOUS DUPLEX Referring Phys: PAMELA LOVE --------------------------------------------------------------------------------  Indications: Edema Comparison Study: No prior study Performing Technologist: Maudry Mayhew MHA, RDMS, RVT, RDCS  Examination Guidelines: A complete evaluation includes B-mode imaging, spectral Doppler, color Doppler, and power Doppler as needed of all accessible portions of each vessel. Bilateral testing is considered an integral  part of a complete examination. Limited examinations for reoccurring indications may be performed as noted.  Right Findings: +----------+------------+---------+-----------+----------+-------+ RIGHT     CompressiblePhasicitySpontaneousPropertiesSummary +----------+------------+---------+-----------+----------+-------+ IJV           Full       Yes       Yes                       +----------+------------+---------+-----------+----------+-------+ Subclavian    Full       Yes       Yes                      +----------+------------+---------+-----------+----------+-------+ Axillary      Full       Yes       Yes                      +----------+------------+---------+-----------+----------+-------+ Brachial      Full       Yes       Yes                      +----------+------------+---------+-----------+----------+-------+ Radial        Full                                          +----------+------------+---------+-----------+----------+-------+ Ulnar         Full                                          +----------+------------+---------+-----------+----------+-------+ Cephalic      Full                                          +----------+------------+---------+-----------+----------+-------+ Basilic       Full                                          +----------+------------+---------+-----------+----------+-------+  Left Findings: +----------+------------+---------+-----------+----------+-------+ LEFT      CompressiblePhasicitySpontaneousPropertiesSummary +----------+------------+---------+-----------+----------+-------+ Subclavian               Yes       Yes                      +----------+------------+---------+-----------+----------+-------+  Summary:  Right: No evidence of deep vein thrombosis in the upper extremity. No evidence of superficial vein thrombosis in the upper extremity.  Left: No evidence of thrombosis in the subclavian.  *See table(s) above for measurements and observations.  Diagnosing physician: Harold Barban MD Electronically signed by Harold Barban MD on 08/13/2021 at 3:16:53 AM.    Final    ?Recent Labs  ?  08/12/21 ?0631  ?WBC 5.1  ?HGB 8.7*  ?HCT 26.7*  ?PLT 194  ? ? ?Recent Labs  ?  08/11/21 ?0133 08/12/21 ?0631  ?NA 139 136  ?K 4.1 3.9  ?CL 98 97*  ?CO2 31 33*  ?GLUCOSE 97 105*  ?BUN  39* 33*  ?CREATININE 2.38* 2.35*  ?CALCIUM 8.3* 8.2*  ? ? ? ?Intake/Output Summary (Last 24 hours) at  08/13/2021 1209 ?Last data filed at 08/13/2021 0700 ?Gross per 24 hour  ?Intake 120 ml  ?Output --  ?Net 120 ml  ? ?  ? ?  ? ?Physical Exam: ?Vital Signs ?Blood pressure 135/79, pulse 83, temperature 98.4 ?F (36.9 ?C), temperature source Oral, resp. rate 16, height 5\' 8"  (1.727 m), weight 87.4 kg, SpO2 100 %. ? ? ?General: No acute distress ?Mood and affect are appropriate ?Heart: Regular rate and rhythm no rubs murmurs or extra sounds ?Lungs: Clear to auscultation, breathing unlabored, no rales or wheezes ?Abdomen: Positive bowel sounds, mildly distended and typanitic mild diffuse tenderness  ? ? ?   Comments: 2+ pedal edema right. RUE with 1+ edema forearm and hand.  ?  ?LUE 5/5 ?RUE 4+/5 delt bi, tri grip ?LLE- 5/5 ?RLE- 5-/5- weaker on R side  ?Ext  ?2+ LE edema to mid calf and 1-2+ RUE swelling  ?Neurological:  ?   Mental Status: He is oriented to person, place, and time.  ?   Comments: Left facial weakness. Lethargic and tended to drift off. Internally distracted and needed cues to follow simple motor commands. He was able to use calender independently to recall date. Able to recall age and DOB.   ?Sleepy, but Ox[employer]- knew at [facility] cone and why; and month/year ?Sensation intact in all 4 extremities  ?Assessment/Plan: ?1. Functional deficits which require 3+ hours per day of interdisciplinary therapy in a comprehensive inpatient rehab setting. ?Physiatrist is providing close team supervision and 24 hour management of active medical problems listed below. ?Physiatrist and rehab team continue to assess barriers to discharge/monitor patient progress toward functional and medical goals ? ?Care Tool: ? ?Bathing ?   ?Body parts bathed by patient: Chest, Abdomen, Front perineal area, Left arm, Right arm, Face  ? Body parts bathed by helper: Buttocks, Right upper leg, Left upper leg, Left lower leg, Right lower leg ?  ?   ?Bathing assist Assist Level: Moderate Assistance - Patient 50 - 74% ?  ?  ?Upper Body Dressing/Undressing ?Upper body dressing   ?What is the patient wearing?: Pull over shirt ?   ?Upper body assist Assist Level: Maximal Assistance - Patient 25 - 49% ?   ?Lower Body Dressing/Undr

## 2021-08-13 NOTE — IPOC Note (Signed)
Overall Plan of Care (IPOC) ?Patient Details ?Name: Edward Hopkins ?MRN: [id number] ?DOB: September 03, 1950 ? ?Admitting Diagnosis: Intramural aortic hematoma (HCC) ? ?Hospital Problems: Principal Problem: ?  Intramural aortic hematoma ([place]) ?Active Problems: ?  Aortic aneurysm rupture ([place]) ?  Chronic kidney disease (CKD), stage IV (severe) (HCC) ?  Debility ? ? ? ? Functional Problem List: ?Nursing Bladder, Bowel, Endurance, Medication Management, Motor, Pain, Safety, Skin Integrity  ?PT Balance, Behavior, Edema, Endurance, Motor, Pain, Perception, Safety, Sensory  ?OT Balance, Cognition, Edema, Endurance, Motor, Safety  ?SLP    ?TR    ?    ? Basic ADL?s: ?OT Grooming, Bathing, Dressing, Toileting  ? ?  Advanced  ADL?s: ?OT    ?   ?Transfers: ?PT Bed Mobility, Bed to Chair, Car, Furniture  ?OT Toilet, Tub/Shower  ? ?  Locomotion: ?PT Ambulation, Stairs  ? ?  Additional Impairments: ?OT None  ?SLP   ?  ?   ?TR    ? ? ?Anticipated Outcomes ?Item Anticipated Outcome  ?Self Feeding independent  ?Swallowing ?   ?  ?Basic self-care ? Min A  ?Toileting ? Min A ?  ?Bathroom Transfers Min A  ?Bowel/Bladder ? Supervision  ?Transfers ? CGA  ?Locomotion ? CGA/ MinA  ?Communication ?    ?Cognition ?    ?Pain ? < 3  ?Safety/Judgment ? Supervision  ? ?Therapy Plan: ?PT Intensity: Minimum of 1-2 x/day ,45 to 90 minutes ?PT Frequency: 5 out of 7 days ?PT Duration Estimated Length of Stay: 14 days ?OT Intensity: Minimum of 1-2 x/day, 45 to 90 minutes ?OT Frequency: 5 out of 7 days ?OT Duration/Estimated Length of Stay: 12-14 days ?   ? ?Due to the current state of emergency, patients may not be receiving their 3-hours of Medicare-mandated therapy. ? ? Team Interventions: ?Nursing Interventions Patient/Family Education, Bladder Management, Bowel Management, Disease Management/Prevention, Pain Management, Medication Management, Skin Care/Wound Management, Discharge Planning, Psychosocial Support  ?PT interventions Ambulation/gait training,  Community reintegration, DME/adaptive equipment instruction, Neuromuscular re-education, Psychosocial support, Stair training, UE/LE Strength taining/ROM, Wheelchair propulsion/positioning, UE/LE Coordination activities, Therapeutic Activities, Skin care/wound management, Pain management, Functional electrical stimulation, Discharge planning, Balance/vestibular training, Cognitive remediation/compensation, Disease management/prevention, Functional mobility training, Patient/family education, Splinting/orthotics, Therapeutic Exercise, Visual/perceptual remediation/compensation  ?OT Interventions Balance/vestibular training, Cognitive remediation/compensation, DME/adaptive equipment instruction, Discharge planning, Functional mobility training, Psychosocial support, Patient/family education, Pain management, Self Care/advanced ADL retraining, UE/LE Strength taining/ROM, Therapeutic Exercise, Therapeutic Activities, UE/LE Coordination activities  ?SLP Interventions    ?TR Interventions    ?SW/CM Interventions Discharge Planning, Psychosocial Support, Patient/Family Education  ? ?Barriers to Discharge ?MD  Medical stability, Home enviroment access/loayout, Incontinence, Wound care, Lack of/limited family support, Weight, Weight bearing restrictions, and Medication compliance  ?Nursing Incontinence, Wound Care, Lack of/limited family support, Weight, Weight bearing restrictions, Medication compliance ?1 level, 1 step, no rail. S/O and health aide to provide 24/7 care.  ?PT Inaccessible home environment, Decreased caregiver support, Home environment access/layout, Incontinence, Wound Care, Lack of/limited family support, Insurance for SNF coverage, Weight, Medication compliance, Behavior, Nutrition means ?   ?OT   ?   ?SLP   ?   ?SW Decreased caregiver support, Lack of/limited family support ?   ? ?Team Discharge Planning: ?Destination: PT-Home ,OT- Home , SLP-  ?Projected Follow-up: PT-Home health PT, OT-  Home health  OT, SLP-  ?Projected Equipment Needs: PT-To be determined, OT- To be determined, SLP-  ?Equipment Details: PT- , OT-  ?Patient/family involved in discharge planning: PT- Patient,  OT-Patient,  SLP-  ? ?MD ELOS: 12-14 days ?Medical Rehab Prognosis:  Good ?Assessment: The patient has been admitted for CIR therapies with the diagnosis of thoracic aneurysm rupture/debility and R sided weakness. The team will be addressing functional mobility, strength, stamina, balance, safety, adaptive techniques and equipment, self-care, bowel and bladder mgt, patient and caregiver education, wound care. Goals have been set at [facility]- min A. Anticipated discharge destination is home. ? ?Due to the current state of emergency, patients may not be receiving their 3 hours per day of Medicare-mandated therapy.  ? ? ?  ? ? ?See Team Conference Notes for weekly updates to the plan of care ? ?

## 2021-08-14 LAB — GLUCOSE, CAPILLARY
Glucose-Capillary: 137 mg/dL — ABNORMAL HIGH (ref 70–99)
Glucose-Capillary: 107 mg/dL — ABNORMAL HIGH (ref 70–99)
Glucose-Capillary: 99 mg/dL (ref 70–99)
Glucose-Capillary: 112 mg/dL — ABNORMAL HIGH (ref 70–99)

## 2021-08-14 NOTE — Progress Notes (Signed)
Physical Therapy Session Note ? ?Patient Details  ?Name: Edward Hopkins ?MRN: [id number] ?Date of Birth: August 19, 1950 ? ?Today's Date: 08/14/2021 ?PT Individual Time: 8756-4332 and 9518-8416 ?PT Individual Time Calculation (min): 58 min and 55 min ? ?Short Term Goals: ?Week 1:    ? ?Skilled Therapeutic Interventions/Progress Updates: Pt presented in bed stating he wanted to go home. Pt aware he is in hospital and frustrated that has had little family support while in hospital. PTA provided active listening and discussed with pt that currently not strong enough to go home. After a bit pt stating he would like to wash up. Pt then indicated was soiled with urine. Pt was minA supine to sit at EOB with strong posterior lean noted. With increased time pt noted to be able to maintain midline with intermittent use of LUE on foot board for support. PTA provided assist to change gown for time management with pt performing Sit to stand with minA (increased time for correction of foot placement as well as for initiation). PTA changed brief and performed peri-care total A. Pt returned to EOB and once set up complete performed stand pivot transfer to w/c with minA and RW. Pt then moved to sink and pt was able to wash face and arms at set up level. Pt transported to nsg station as pt requesting OJ. Pt was then able to propel back to room via w/c using BUE and increased time/effort. Pt agreeable to remain in w/c at end of session with belt alarm on, call bell within reach and current needs met.  ? ?Tx2: Pt presented in bed agreeable to therapy with encouragement. Per nsg pt was able to stay up ~45 min before returning to bed after previous session. PTA noted that pt had not eaten any lunch, PTA encouraging pt to eat and providing education on importance of consuming proteins to build up strength however pt stating hasn't had much of an appetite. Pt performed bed mobility with minA and use of bed features. Pt continues to demonstrate  posterior lean causing hips to slide forward requiring PTA to block R knee to prevent pt sliding off bed. Pt performed Sit to stand with minA and ambulatory transfer to w/c with RW and minA. Pt attempted to propel w/c in hallway however stating too fatigued to complete. Pt transported to day room and set up to participate in ambulation. Pt performed Sit to stand from w/c with minA. Pt noted to maintain RLE extended when pushing up and difficulty performing anterior weight shifting. Pt is able to achieve improved erect posture once in full stand. Pt initiating ambulation then stating may need to have BM. Pt returned to w/c and transported back to room. Pt performed stand pivot transfer to w/c with use of wall rail and minA with pt again fully extending RLE when initiating stand. Pt able to pivot with minA and required total A for clothing management (+urinary void/BM). Pt required total A for peri-care once completed. Pt then ambulated toilet to room ~32f with RW and minA but requiring max cues for maintaining close proximity to RW and pt leaning forward heavily. Pt also noted to have narrow BOS, decreased heel strike, and decreased B foot clearance. Pt with decreased safety awareness and once at EOB throws self back onto bed with legs handing off EOB. With increased time pt able to follow cues to reposition self in bed and was [facility] and use of bed rails. Pt  then turned to L side and left with bed alarm on, call bell within reach and needs met.  ?   ? ?Therapy Documentation ?Precautions:  ?Precautions ?Precautions: Fall ?Precaution Comments: mild R hemipareisis, 2L O2, slow to initiate ?Restrictions ?Weight Bearing Restrictions: No ?RLE Weight Bearing: Non weight bearing ?General: ?  ?Vital Signs: ?  ?Pain: ?Pain Assessment ?Pain Scale: 0-10 ?Pain Score: 0-No pain ?Mobility: ?  ?Locomotion : ?   ?Trunk/Postural Assessment : ?   ?Balance: ?  ?Exercises: ?  ?Other Treatments:    ? ? ? ?Therapy/Group: Individual Therapy ? ?Nasirah Sachs ?08/14/2021, 12:50 PM  ?

## 2021-08-15 ENCOUNTER — Inpatient Hospital Stay (HOSPITAL_COMMUNITY): Payer: Medicare Other

## 2021-08-15 DIAGNOSIS — I639 Cerebral infarction, unspecified: Secondary | ICD-10-CM

## 2021-08-15 LAB — URINALYSIS, ROUTINE W REFLEX MICROSCOPIC
Bacteria, UA: NONE SEEN
Bilirubin Urine: NEGATIVE
Glucose, UA: NEGATIVE mg/dL
Hgb urine dipstick: NEGATIVE
Ketones, ur: NEGATIVE mg/dL
Leukocytes,Ua: NEGATIVE
Nitrite: NEGATIVE
Protein, ur: 30 mg/dL — AB
Specific Gravity, Urine: 1.011 (ref 1.005–1.030)
pH: 8 (ref 5.0–8.0)

## 2021-08-15 LAB — BASIC METABOLIC PANEL
Anion gap: 7 (ref 5–15)
BUN: 31 mg/dL — ABNORMAL HIGH (ref 8–23)
CO2: 33 mmol/L — ABNORMAL HIGH (ref 22–32)
Calcium: 8.6 mg/dL — ABNORMAL LOW (ref 8.9–10.3)
Chloride: 97 mmol/L — ABNORMAL LOW (ref 98–111)
Creatinine, Ser: 2.37 mg/dL — ABNORMAL HIGH (ref 0.61–1.24)
GFR, Estimated: 28 mL/min — ABNORMAL LOW (ref >60)
Glucose, Bld: 93 mg/dL (ref 70–99)
Potassium: 4.3 mmol/L (ref 3.5–5.1)
Sodium: 137 mmol/L (ref 135–145)

## 2021-08-15 LAB — CBC
HCT: 27.5 % — ABNORMAL LOW (ref 39.0–52.0)
Hemoglobin: 8.5 g/dL — ABNORMAL LOW (ref 13.0–17.0)
MCH: 27.5 pg (ref 26.0–34.0)
MCHC: 30.9 g/dL (ref 30.0–36.0)
MCV: 89 fL (ref 80.0–100.0)
Platelets: 219 10*3/uL (ref 150–400)
RBC: 3.09 MIL/uL — ABNORMAL LOW (ref 4.22–5.81)
RDW: 15 % (ref 11.5–15.5)
WBC: 5.1 10*3/uL (ref 4.0–10.5)
nRBC: 0 % (ref 0.0–0.2)

## 2021-08-15 LAB — GLUCOSE, CAPILLARY
Glucose-Capillary: 98 mg/dL (ref 70–99)
Glucose-Capillary: 95 mg/dL (ref 70–99)
Glucose-Capillary: 106 mg/dL — ABNORMAL HIGH (ref 70–99)
Glucose-Capillary: 108 mg/dL — ABNORMAL HIGH (ref 70–99)

## 2021-08-15 MED ORDER — METOCLOPRAMIDE HCL 5 MG PO TABS
5.0000 mg | ORAL_TABLET | Freq: Two times a day (BID) | ORAL | Status: DC
  Administered 2021-08-15 – 2021-09-07 (×43): 5 mg via ORAL
  Filled 2021-08-15 (×46): qty 1

## 2021-08-15 MED ORDER — POLYETHYLENE GLYCOL 3350 17 G PO PACK
17.0000 g | PACK | Freq: Every day | ORAL | Status: DC
Start: 2021-08-16 — End: 2021-09-07
  Administered 2021-08-16 – 2021-09-05 (×15): 17 g via ORAL
  Filled 2021-08-15 (×22): qty 1

## 2021-08-15 MED ORDER — CLOPIDOGREL BISULFATE 75 MG PO TABS: 75.0000 mg | ORAL_TABLET | Freq: Every day | ORAL | Status: DC

## 2021-08-15 MED ORDER — ASPIRIN EC 81 MG PO TBEC: 81.0000 mg | DELAYED_RELEASE_TABLET | Freq: Every day | ORAL | Status: DC

## 2021-08-15 MED ORDER — ASPIRIN EC 325 MG PO TBEC
325.0000 mg | DELAYED_RELEASE_TABLET | Freq: Once | ORAL | Status: AC
  Administered 2021-08-15: 325 mg via ORAL
  Filled 2021-08-15: qty 1

## 2021-08-15 NOTE — Progress Notes (Signed)
Patient reported to have "chest pain" again. On exam, patient reporting this is new pain--the other that he usually has is more diffuse. On exam, patient reporting left lateral chest wall pain --reproducible to palpation. On exam of left breast/pectoral area, fluctuance (question cystic) felt at 2-4 O'clock that is tender to touch and reproduces the pain--no erythema or warmth.  Will order breast ultrasound for evaluation.  ?

## 2021-08-15 NOTE — Progress Notes (Signed)
?                                                       PROGRESS NOTE ? ? ?Subjective/Complaints: ? ?Pt reports had loose incontinent stool- knew he did, but couldn't hold it.  ?Per OTA, pt appears a little bit confused.  ?ROS- limited by disorientation ? ?Objective: ?  ?DG Chest 2 View ? ?Result Date: 08/15/2021 ?CLINICAL DATA:  Persistent chest pain and shortness of breath. EXAM: CHEST - 2 VIEW COMPARISON:  Chest x-ray 08/08/2021 FINDINGS: Cardiomediastinal silhouette is stable. Aortic graft/stent is present. Sternotomy wires are present. There are persistent left lower lobe patchy opacities. Questionable small left pleural effusion. Right lung is clear. No pneumothorax. Surgical clips overlie the right lung apex. No acute fractures are seen. IMPRESSION: 1. Persistent left basilar atelectasis/airspace disease with questionable small pleural effusion. Electronically Signed   By: Ronney Asters M.D.   On: 08/15/2021 17:05   ?Recent Labs  ?  08/15/21 ?0704  ?WBC 5.1  ?HGB 8.5*  ?HCT 27.5*  ?PLT 219  ? ?Recent Labs  ?  08/15/21 ?0704  ?NA 137  ?K 4.3  ?CL 97*  ?CO2 33*  ?GLUCOSE 93  ?BUN 31*  ?CREATININE 2.37*  ?CALCIUM 8.6*  ? ? ?Intake/Output Summary (Last 24 hours) at 08/15/2021 1904 ?Last data filed at 08/15/2021 1737 ?Gross per 24 hour  ?Intake 0 ml  ?Output 804 ml  ?Net -804 ml  ?  ? ?  ? ?Physical Exam: ?Vital Signs ?Blood pressure 135/68, pulse 80, temperature 99 ?F (37.2 ?C), temperature source Oral, resp. rate 17, height 5\' 8"  (1.727 m), weight 86.8 kg, SpO2 100 %. ? ? ? ?General: awake, alert, appropriate, incontinent of liquid stool- on L side in bed;  NAD ?HENT: conjugate gaze; oropharynx moist ?CV: regular rate; no JVD ?Pulmonary: CTA B/L; no W/R/R- good air movement ?GI: soft, NT, ND, (+)BS- hyperactive ?Psychiatric: very flat- straight /flat responses ?Neurological: disoriented/a little confused-  ?   Comments: 2+ pedal edema right. RUE with 1+ edema forearm and hand.  ?  ?LUE 5/5 ?RUE 4+/5 delt bi, tri  grip ?LLE- 5/5 ?RLE- 5-/5- weaker on R side  ?Ext  ?2+ LE edema to mid calf and 1-2+ RUE swelling  ?Neurological:  ?   Mental Status: He is oriented to person, place, and time.  ?   Comments: Left facial weakness. Lethargic and tended to drift off. Internally distracted and needed cues to follow simple motor commands. He was able to use calender independently to recall date. Able to recall age and DOB.   ?Sleepy, but Ox3- knew at [facility] cone and why; and month/year ?Sensation intact in all 4 extremities  ?Assessment/Plan: ?1. Functional deficits which require 3+ hours per day of interdisciplinary therapy in a comprehensive inpatient rehab setting. ?Physiatrist is providing close team supervision and 24 hour management of active medical problems listed below. ?Physiatrist and rehab team continue to assess barriers to discharge/monitor patient progress toward functional and medical goals ? ?Care Tool: ? ?Bathing ?   ?Body parts bathed by patient: Chest, Abdomen, Front perineal area, Left arm, Right arm, Face  ? Body parts bathed by helper: Buttocks, Right upper leg, Left upper leg, Left lower leg, Right lower leg ?  ?  ?Bathing assist Assist Level: Moderate Assistance - Patient 50 -  74% ?  ?  ?Upper Body Dressing/Undressing ?Upper body dressing   ?What is the patient wearing?: Pull over shirt ?   ?Upper body assist Assist Level: Maximal Assistance - Patient 25 - 49% ?   ?Lower Body Dressing/Undressing ?Lower body dressing ? ? ?   ?What is the patient wearing?: Pants, Incontinence brief ? ?  ? ?Lower body assist Assist for lower body dressing: Maximal Assistance - Patient 25 - 49% ?   ? ?Toileting ?Toileting    ?Toileting assist Assist for toileting: 2 Helpers ?  ?  ?Transfers ?Chair/bed transfer ? ?Transfers assist ?   ? ?Chair/bed transfer assist level: Minimal Assistance - Patient > 75% ?  ?  ?Locomotion ?Ambulation ? ? ?Ambulation assist ? ? Ambulation activity did not occur: Safety/medical concerns ? ?  ?  ?    ? ?Walk 10 feet activity ? ? ?Assist ? Walk 10 feet activity did not occur: Safety/medical concerns ? ?  ?   ? ?Walk 50 feet activity ? ? ?Assist Walk 50 feet with 2 turns activity did not occur: Safety/medical concerns ? ?  ?   ? ? ?Walk 150 feet activity ? ? ?Assist Walk 150 feet activity did not occur: Safety/medical concerns ? ?  ?  ?  ? ?Walk 10 feet on uneven surface  ?activity ? ? ?Assist Walk 10 feet on uneven surfaces activity did not occur: Safety/medical concerns ? ? ?  ?   ? ?Wheelchair ? ? ? ? ?Assist Is the patient using a wheelchair?: Yes ?Type of Wheelchair: Manual ?  ? ?  ?   ? ? ?Wheelchair 50 feet with 2 turns activity ? ? ? ?Assist ? ?  ?Wheelchair 50 feet with 2 turns activity did not occur: Safety/medical concerns ? ? ?   ? ?Wheelchair 150 feet activity  ? ? ? ?Assist ? Wheelchair 150 feet activity did not occur: Safety/medical concerns ? ? ?   ? ?Blood pressure 135/68, pulse 80, temperature 99 ?F (37.2 ?C), temperature source Oral, resp. rate 17, height 5\' 8"  (1.727 m), weight 86.8 kg, SpO2 100 %. ? ?Medical Problem List and Plan: ?1. Functional deficits secondary to debility from  thoracic dissection of aneurysm  ?            -patient may  shower ?            -ELOS/Goals: 12-14 days min A to supervision ? Continue CIR- PT, OT and SLP ?2.  Antithrombotics: ?-DVT/anticoagulation:  Pharmaceutical: Lovenox. Will order dopplers of RUE.  ?            -antiplatelet therapy: N/A ?3. Pain Management:  oxycodone prn  ?4. Mood: LCSW to follow for evaluation and support.  ?            -antipsychotic agents: N/A ?5. Neuropsych: This patient may be intermittently capable of making decisions on his own behalf. ?6. Skin/Wound Care: Routine pressure relief measures.  Monitor areas on right forearm/wrist for demarcation ?            -will add Vitamin C and Zinc to promote healing.  ?7. Fluids/Electrolytes/Nutrition: Monitor I/O. Check lytes in am.  ?8. Ruptured  descending thoracic aneurysm s/p repair: BP  management.  ?            --will need repeat CTA 4 weeks (monitoring SCr  for improvement) ?9. Acute on chronic renal failure: with new CKD Stage IV Improvement in BUN/SCr- 39/2.38 ?            --  monitor with serial checks. Avoid nephrotoxic medications.  ? 3/31- Cr and BUN stable con't to monitor weekly.  ? 4/3- Cr still 2.3-2.4- is stable- per Vascular wants pt kidney function to improve some, before CTA< however need to check for PE/so will do both ?10. Acute blood loss anemia: Recheck CBC in am.  ? 4/3- Hb very slightly less at 8.5- will monitor ?11. Hypoxia: Wean oxygen as tolerated. Monitor for signs of overload ?            --Off diuresis. Resp rate increase with minimal movement- will monitor O2 sats.  ? 3/31- monitor closely, since  SOB with minimal exertion. ?4/3- still requiring O2- will check for PE this evening- since pt's still has DOE.   ?12. ABLA: Improving with Epogen and thrombocytopenia has resolved.   ? 3/31- Hb 8.7- stable to slightly down- was 9.1 a few days ago- ? 4/3- Hb 8.5- overall stable ?13. Polysubstance abuse: Educated on importance of cessation ?            --smokes > 1 PPD and marijuana/cocaine-->Has planned to quit.  ?14. Obesity: BMI 30.- initially documented as BMI of 68- was wrong- Educate on diet and importance of weight loss to help promote mobility and overall health.  ?15. Ileus: Has been refusing daily suppository--last BM on 03/25.  ?--Intake poor--0-50% in the past 24 hours with abdominal distension. ?--Will increase Miralax to bid.Check KUB for follow up.  ?3/31- KUB looks better, but still distended loops of bowel- not eating much- will start Reglan 5 mg TID for the moment and see if things improve-KUB improving today , add dulc supp, mylicon ?16. R hemiparesis with R facial droop- head CT already done- will assess if can get brain MRI ? 3/31- will ask vascular since had stent- and se eif can get MRI ? 4/3- appears has new stroke in L frontal and cerebellum 3 spots- c/w  acute infarct/embolism from aneurysm,  ?17. RUE swelling- vascular ulcers/tissue necrosis? ? 3/31- with pain/sensitivity of RUE and tissue necrosis possibly, calling vascular to see additional w/u of RUE ?18. Inte
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
## 2021-08-15 NOTE — Progress Notes (Signed)
Patient had CP with activity "that was going down" He reports chest pain to back has been ongoing (worse in the back) since admission. He was tangential but able to answer basic orientation questions. Pain gets better occasionally. EKG done which is relatively stable. Moves all 4 extremities. H/H is trending down but likely hemodilution due to improvement of BUN/SCr. On evaluation of CT note that he had thoracic hematoma as well as retroperitoneal bleed due to dissection.  ? ?Case was discussed with Dr. Donzetta Matters by Vaughan Basta PA.  Who reported that dissection has been repaired with recs for repeat CTA in a month (trying to avoid dye study to allow kidney's to recover). If he does not show signs of ischemia and hemodynamically stable to monitor for now. To order CTA chest/abdomen if patient with progressive symptoms or any signs of decline.   ?

## 2021-08-15 NOTE — Progress Notes (Signed)
Physical Therapy Session Note ? ?Patient Details  ?Name: Edward Hopkins ?MRN: [id number] ?Date of Birth: 1951-01-27 ? ?Today's Date: 08/15/2021 ?PT Individual Time: 9562-1308; 6578-4696 ?PT Individual Time Calculation (min): 70 min and 30 min ?PT Missed Time: 15 min ?Missed Time Reason: patient illness (severe indigestion and chest pain) ? ?Short Term Goals: ?Week 1:  PT Short Term Goal 1 (Week 1): Pt will complete least restrictive transfer with min A consistently ?PT Short Term Goal 2 (Week 1): Pt will ambulate x 25 ft with LRAD and min A ?PT Short Term Goal 3 (Week 1): Pt will initiate stair training as safe and able ? ?Skilled Therapeutic Interventions/Progress Updates:  ?  Session 1: ?Pt received seated in bed attempting to finish breakfast. Pt reports he has not had much of an appetite, agreeable to try some Ensure. Provided pt with Ensure and he drinks entire bottle. Pt then reports some indigestion due to drinking his Ensure too fast. Pt frequently complains of abdominal bloating and discomfort during session. Pt on 3L O2 via [place], SpO2 remains at 95% and higher during session. Pt found to have urinary incontinence in his brief. Rolling L/R with min A and use of bedrails, increased time needed due to pain. Dependent for brief change and pericare. Supine to sit with mod A with increased time and cues needed for sequencing and for some LE management and for trunk control. Sit to stand with min A to RW, stand pivot transfer to w/c with min A and use of RW. Pt taken dependently via w/c to therapy gym for time and energy conservation. Pt then reports urgent need to toilet. Sit to stand with min A to stedy, stedy transfer to elevated BSC over toilet. Pt incontinent of loose stool in his brief, able to further void once seated on toilet. Pt is dependent for pericare with fair tolerance for sitting on BSC due to back pain. Stedy transfer back to bed. Sit to supine mod A needed for BLE management. Rolling L/R with min A and  use of bedrails for completion of pericare and donning of new brief. Pt reports need to have another BM, placed bedpan under him due to urgency. Pt then requests bedpan be removed due to discomfort. Pt to notify nursing staff when he has had a BM to have pericare completed. Pt left semi-reclined in bed with needs in reach, bed alarm in place. ? ?Session 2: ?Pt received sidelying in bed asleep, arousable but remains somewhat groggy and not as alert as AM session. Pt's lunch at his bedside, appears to have not eaten much and reports ongoing indigestion and lack of appetite. Pt also reports having chest pain described as "squeezing" and "behind my heart". Unsure if chest pain related to indigestion or otherwise so notified nursing. Vitals assessed and WNL. Nursing able to administer medication for indigestion and will alert medical team to pt's complaints of chest pain. Pt requesting assist using room phone to call a friend/family member. Assisted pt with placing his phone call. Pt unable to further functionally participate in therapy session 2/2 pain and ingestion this PM. Pt missed 15 min of scheduled therapy session, will follow up per POC. ? ?Therapy Documentation ?Precautions:  ?Precautions ?Precautions: Fall ?Precaution Comments: mild R hemipareisis, 2L O2, slow to initiate ?Restrictions ?Weight Bearing Restrictions: No ?RLE Weight Bearing: Non weight bearing ? ? ? ? ? ?Therapy/Group: Individual Therapy ? ? ?Excell Seltzer, PT, DPT, CSRS ? ?08/15/2021, 2:25 PM  ?

## 2021-08-15 NOTE — Progress Notes (Signed)
Occupational Therapy Session Note ? ?Patient Details  ?Name: Edward Hopkins ?MRN: [id number] ?Date of Birth: 02/05/1951 ? ?Today's Date: 08/15/2021 ?OT Individual Time: 5027-7412 ?OT Individual Time Calculation (min): 45 min  ? ? ?Short Term Goals: ?Week 1:  OT Short Term Goal 1 (Week 1): Pt will be able to sit to EOB with min A within 2 minutes demonstrating improved initiation and motor skills. ?OT Short Term Goal 2 (Week 1): sitting EOB, pt will don tshirt with min A. ?OT Short Term Goal 3 (Week 1): Pt will be able to stand with min A to RW within 2 minutes to prep for LB dressing. ?OT Short Term Goal 4 (Week 1): Pt will be able to transfer to [facility] with mod A. ? ?Skilled Therapeutic Interventions/Progress Updates:  ?  Pt resting in bed upon arrival. Pt concerned that he is unable to contact family. Pt with difficulty using phone. OTA dialed phone for pt. Pt lethargic this morning and c/o generalized discomfort. Pt requested some juice but declined eating breakfast. Upon return, pt incontinent of bowel. Pt required min A for rolling R/L to facilitate hygiene. +2 for hygiene and donning brief at bed level. Pt donned hospital gown upon completion. Pt with difficulty keeping eyes open and unable to initiate sitting EOB. Pt remained in bed with all needs within reach. Bed alarm activated. ? ?Therapy Documentation ?Precautions:  ?Precautions ?Precautions: Fall ?Precaution Comments: mild R hemipareisis, 2L O2, slow to initiate ?Restrictions ?Weight Bearing Restrictions: No ?RLE Weight Bearing: Non weight bearing ?  ?Pain: ? Pt c/o generalized discomfort; repositoined and emotional support ? ? ?Therapy/Group: Individual Therapy ? ?[name] ?08/15/2021, 8:31 AM ?

## 2021-08-15 NOTE — Progress Notes (Signed)
?  08/15/21 1430  ?Clinical Encounter Type  ?Visited With Patient;Health care provider  ?Visit Type Initial;Spiritual support  ?Referral From Nurse  ?Consult/Referral To Chaplain  ?Spiritual Encounters  ?Spiritual Needs Emotional;Prayer  ? ?Chaplain met with patient who appeared tired but willing to talk. Patient indicated that he was not sure why he was unable to eat and was concerned that he was not getting better. Patient's adult children do not live in the area but he has a long-time friend. We discussed his need to know more about the plan of care and the possibility of an Advance Directive.  Chaplain was concerned and encouraged the patient to speak with his medical team as soon as possible. Chaplain was on-site when the PA arrived and stayed with patient for a portion of the exam. As requested, Chaplain also provided encouragement and spiritual prayer.  ? ?Melody Haver, Resident Chaplain ?([phone]  ?

## 2021-08-15 NOTE — Progress Notes (Signed)
?  Progress Note ? ? ? ?08/15/2021 ?10:39 AM ? ? ?Subjective:  no complaints ? ? ?Vitals:  ? 08/14/21 2023 08/15/21 0406  ?BP: 127/73 135/70  ?Pulse: 82 81  ?Resp: 18 14  ?Temp: 98.2 ?F (36.8 ?C) 98.9 ?F (37.2 ?C)  ?SpO2: 100% 96%  ? ? ?Physical Exam: ?General:  resting comfortably ?Lungs:  non labored ?Incisions:  right groin is soft ?Extremities:  easily palpable DP and radial pulses bilaterally ? ? ?CBC ?   ?Component Value Date/Time  ? WBC 5.1 08/15/2021 0704  ? RBC 3.09 (L) 08/15/2021 0704  ? HGB 8.5 (L) 08/15/2021 0704  ? HGB 13.8 10/21/2015 1515  ? HCT 27.5 (L) 08/15/2021 0704  ? HCT 39.7 10/21/2015 1515  ? PLT 219 08/15/2021 0704  ? PLT 139 (L) 10/21/2015 1515  ? MCV 89.0 08/15/2021 0704  ? MCV 88.0 10/21/2015 1515  ? MCH 27.5 08/15/2021 0704  ? MCHC 30.9 08/15/2021 0704  ? RDW 15.0 08/15/2021 0704  ? RDW 13.3 10/21/2015 1515  ? LYMPHSABS 1.4 08/12/2021 0631  ? LYMPHSABS 2.4 10/21/2015 1515  ? MONOABS 0.6 08/12/2021 0631  ? MONOABS 0.4 10/21/2015 1515  ? EOSABS 0.1 08/12/2021 0631  ? EOSABS 0.4 10/21/2015 1515  ? BASOSABS 0.0 08/12/2021 0631  ? BASOSABS 0.0 10/21/2015 1515  ? ? ?BMET ?   ?Component Value Date/Time  ? NA 137 08/15/2021 0704  ? NA 141 10/21/2015 1516  ? K 4.3 08/15/2021 0704  ? K 4.1 10/21/2015 1516  ? CL 97 (L) 08/15/2021 0704  ? CO2 33 (H) 08/15/2021 0704  ? CO2 21 (L) 10/21/2015 1516  ? GLUCOSE 93 08/15/2021 0704  ? GLUCOSE 114 10/21/2015 1516  ? BUN 31 (H) 08/15/2021 0704  ? BUN 21.6 10/21/2015 1516  ? CREATININE 2.37 (H) 08/15/2021 0704  ? CREATININE 1.4 (H) 10/21/2015 1516  ? CALCIUM 8.6 (L) 08/15/2021 0704  ? CALCIUM 9.3 10/21/2015 1516  ? GFRNONAA 28 (L) 08/15/2021 0704  ? GFRAA >90 08/30/2014 0243  ? ? ?INR ?   ?Component Value Date/Time  ? INR 1.7 (H) 08/01/2021 1532  ? ? ? ?Intake/Output Summary (Last 24 hours) at 08/15/2021 1039 ?Last data filed at 08/15/2021 0547 ?Gross per 24 hour  ?Intake 0 ml  ?Output 579 ml  ?Net -579 ml  ? ? ? ?Assessment/Plan:  71 y.o. male is s/p:  ?repair of  ruptured descending thoracic dissection on 08/01/21 with hemorraghic shock now in CIR.  ? ?-pt doing well and continues to have palpable DP and radial pulses bilaterally.   ?-AKI on CKD and now creatinine stable at 2.35. ?-pt to follow up with Dr. Carlis Abbott in a month with [facility].  Our office will arrange appt. ? ? ?Leontine Locket, PA-C ?[facility] ?[phone] ?08/15/2021 ?10:39 AM ? ? ? ?

## 2021-08-15 NOTE — Progress Notes (Signed)
Chaplain attempted to complete [facility] consult request for prayer, but pt was asleep at the time of visit. Spiritual Care team will continue to follow. ? ?Please page as further needs arise. ? ?Donald Prose. Elyn Peers, M.Div. BCC ?Chaplain ?Pager [phone] ?Office [phone] ? ?

## 2021-08-15 NOTE — Discharge Instructions (Addendum)
Inpatient Rehab Discharge Instructions ? ?Edward Hopkins ?Discharge date and time:   ? ?Activities/Precautions/ Functional Status: ?Activity: no lifting, driving, or strenuous exercise till cleared by surgery ?Diet: cardiac diet ?Wound Care: keep wound clean and dry ? ? ?Functional status:  ?___ No restrictions     ___ Walk up steps independently ?_X__ 24/7 supervision/assistance   ___ Walk up steps with assistance ?___ Intermittent supervision/assistance  ___ Bathe/dress independently ?___ Walk with walker     ___ Bathe/dress with assistance ?___ Walk Independently    ___ Shower independently ?___ Walk with assistance    ___ Shower with assistance ?_X__ No alcohol     ___ Return to work/school ________ ? ? ?COMMUNITY REFERRALS UPON DISCHARGE:   ? ?Home Health:   PT      OT      ST     RN     SNA     SW     ?               Agency:  Phone:   ? ?  ? ?Medical Equipment/Items Ordered:  ?                                                Agency/Supplier:  ? ?GENERAL COMMUNITY RESOURCES FOR PATIENT/FAMILY: ?Please be sure to follow-up with  [facility] [phone] to discuss restarting home care services. Please be prepared for possible delay and a new assessment if services have not been in place for greater than 30 days.  ? ? ? ? ?Special Instructions: ? ? ? ?My questions have been answered and I understand these instructions. I will adhere to these goals and the provided educational materials after my discharge from the hospital. ? ?Patient/Caregiver Signature _______________________________ Date __________ ? ?Clinician Signature _______________________________________ Date __________ ? ?Please bring this form and your medication list with you to all your follow-up doctor's appointments.   ?

## 2021-08-15 NOTE — Care Management (Signed)
Inpatient Rehabilitation Center ?Individual Statement of Services ? ?Patient Name:  Edward Hopkins  ?Date:  08/15/2021 ? ?Welcome to the [facility].  Our goal is to provide you with an individualized program based on your diagnosis and situation, designed to meet your specific needs.  With this comprehensive rehabilitation program, you will be expected to participate in at least 3 hours of rehabilitation therapies Monday-Friday, with modified therapy programming on the weekends. ? ?Your rehabilitation program will include the following services:  Physical Therapy (PT), Occupational Therapy (OT), 24 hour per day rehabilitation nursing, Therapeutic Recreaction (TR), Psychology, Neuropsychology, Care Coordinator, Rehabilitation Medicine, Nutrition Services, Pharmacy Services, and Other ? ?Weekly team conferences will be held on Tuesdays to discuss your progress.  Your Inpatient Rehabilitation Care Coordinator will talk with you frequently to get your input and to update you on team discussions.  Team conferences with you and your family in attendance may also be held. ? ?Expected length of stay: 12-14 days ? ?Overall anticipated outcome: Minimal Assistance ? ?Depending on your progress and recovery, your program may change. Your Inpatient Rehabilitation Care Coordinator will coordinate services and will keep you informed of any changes. Your Inpatient Rehabilitation Care Coordinator's name and contact numbers are listed  below. ? ?The following services may also be recommended but are not provided by the [facility]:  ?Driving Evaluations ?Home Health Rehabiltiation Services ?Outpatient Rehabilitation Services ?Vocational Rehabilitation ?  ?Arrangements will be made to provide these services after discharge if needed.  Arrangements include referral to agencies that provide these services. ? ?Your insurance has been verified to be:  [facility] Medicare ? ?Your primary doctor is:  No PCP  listed ? ?Pertinent information will be shared with your doctor and your insurance company. ? ?Inpatient Rehabilitation Care Coordinator:  Cathleen Corti [phone] or (C) [phone] ? ?Information discussed with and copy given to patient by: Rana Snare, 08/15/2021, 11:06 AM    ?

## 2021-08-16 ENCOUNTER — Inpatient Hospital Stay (HOSPITAL_COMMUNITY): Payer: Medicare Other

## 2021-08-16 ENCOUNTER — Encounter (HOSPITAL_COMMUNITY): Payer: Self-pay | Admitting: Physical Medicine and Rehabilitation

## 2021-08-16 ENCOUNTER — Inpatient Hospital Stay (HOSPITAL_BASED_OUTPATIENT_CLINIC_OR_DEPARTMENT_OTHER): Payer: Medicare Other

## 2021-08-16 DIAGNOSIS — I2699 Other pulmonary embolism without acute cor pulmonale: Secondary | ICD-10-CM | POA: Diagnosis not present

## 2021-08-16 DIAGNOSIS — R079 Chest pain, unspecified: Secondary | ICD-10-CM | POA: Diagnosis not present

## 2021-08-16 DIAGNOSIS — Z86718 Personal history of other venous thrombosis and embolism: Secondary | ICD-10-CM

## 2021-08-16 DIAGNOSIS — I7113 Aneurysm of the descending thoracic aorta, ruptured: Secondary | ICD-10-CM

## 2021-08-16 DIAGNOSIS — R531 Weakness: Secondary | ICD-10-CM

## 2021-08-16 DIAGNOSIS — I2693 Single subsegmental pulmonary embolism without acute cor pulmonale: Secondary | ICD-10-CM

## 2021-08-16 DIAGNOSIS — R29810 Facial weakness: Secondary | ICD-10-CM

## 2021-08-16 DIAGNOSIS — R0602 Shortness of breath: Secondary | ICD-10-CM

## 2021-08-16 DIAGNOSIS — N184 Chronic kidney disease, stage 4 (severe): Secondary | ICD-10-CM

## 2021-08-16 DIAGNOSIS — I6340 Cerebral infarction due to embolism of unspecified cerebral artery: Secondary | ICD-10-CM | POA: Insufficient documentation

## 2021-08-16 LAB — CBC
HCT: 26 % — ABNORMAL LOW (ref 39.0–52.0)
Hemoglobin: 8.3 g/dL — ABNORMAL LOW (ref 13.0–17.0)
MCH: 28 pg (ref 26.0–34.0)
MCHC: 31.9 g/dL (ref 30.0–36.0)
MCV: 87.8 fL (ref 80.0–100.0)
Platelets: 215 10*3/uL (ref 150–400)
RBC: 2.96 MIL/uL — ABNORMAL LOW (ref 4.22–5.81)
RDW: 14.8 % (ref 11.5–15.5)
WBC: 5 10*3/uL (ref 4.0–10.5)
nRBC: 0 % (ref 0.0–0.2)

## 2021-08-16 LAB — LACTIC ACID, PLASMA: Lactic Acid, Venous: 0.7 mmol/L (ref 0.5–1.9)

## 2021-08-16 LAB — PROTIME-INR
INR: 1.3 — ABNORMAL HIGH (ref 0.8–1.2)
Prothrombin Time: 16.4 seconds — ABNORMAL HIGH (ref 11.4–15.2)

## 2021-08-16 LAB — ECHOCARDIOGRAM COMPLETE
Area-P 1/2: 3.08 cm2
Height: 68 in
P 1/2 time: 460 msec
S' Lateral: 3.1 cm
Single Plane A2C EF: 59.4 %
Weight: 3062.4 oz

## 2021-08-16 LAB — URINE CULTURE: Culture: <10000 — AB

## 2021-08-16 LAB — GLUCOSE, CAPILLARY
Glucose-Capillary: 102 mg/dL — ABNORMAL HIGH (ref 70–99)
Glucose-Capillary: 100 mg/dL — ABNORMAL HIGH (ref 70–99)
Glucose-Capillary: 98 mg/dL (ref 70–99)
Glucose-Capillary: 109 mg/dL — ABNORMAL HIGH (ref 70–99)

## 2021-08-16 LAB — LIPID PANEL
Cholesterol: 144 mg/dL (ref 0–200)
HDL: 14 mg/dL — ABNORMAL LOW (ref >40)
LDL Cholesterol: 99 mg/dL (ref 0–99)
Total CHOL/HDL Ratio: 10.3 RATIO
Triglycerides: 153 mg/dL — ABNORMAL HIGH (ref <150)
VLDL: 31 mg/dL (ref 0–40)

## 2021-08-16 LAB — APTT: aPTT: 33 seconds (ref 24–36)

## 2021-08-16 LAB — TROPONIN I (HIGH SENSITIVITY): Troponin I (High Sensitivity): 10 ng/L (ref <18)

## 2021-08-16 LAB — BRAIN NATRIURETIC PEPTIDE: B Natriuretic Peptide: 106.3 pg/mL — ABNORMAL HIGH (ref 0.0–100.0)

## 2021-08-16 MED ORDER — TECHNETIUM TO 99M ALBUMIN AGGREGATED
5.1000 | Freq: Once | INTRAVENOUS | Status: AC | PRN
  Administered 2021-08-16: 5.1 via INTRAVENOUS

## 2021-08-16 MED ORDER — ASPIRIN EC 81 MG PO TBEC
81.0000 mg | DELAYED_RELEASE_TABLET | Freq: Every day | ORAL | Status: DC
  Administered 2021-08-16 – 2021-09-07 (×22): 81 mg via ORAL
  Filled 2021-08-16 (×23): qty 1

## 2021-08-16 MED ORDER — LIDOCAINE 5 % EX PTCH
2.0000 | MEDICATED_PATCH | CUTANEOUS | Status: DC
  Administered 2021-08-16 – 2021-08-28 (×10): 2 via TRANSDERMAL
  Filled 2021-08-16 (×18): qty 2

## 2021-08-16 NOTE — Progress Notes (Signed)
Physical Therapy Note ? ?Patient Details  ?Name: Edward Hopkins ?MRN: [id number] ?Date of Birth: [birth year] ?Today's Date: 08/16/2021 ? ? ?Attempted to see patient for scheduled therapy session, per nursing pt is on medical hold 2/2 PE. Pt missed 60 min of scheduled therapy session due to medical hold. ? ? ?Excell Seltzer, PT, DPT, CSRS ?08/16/2021, 1:32 PM  ?

## 2021-08-16 NOTE — Progress Notes (Signed)
?  Echocardiogram ?2D Echocardiogram has been performed. ? ?Edward Hopkins ?08/16/2021, 4:38 PM ?

## 2021-08-16 NOTE — Progress Notes (Signed)
Per [facility] radiology patient with positive PE to R middle lobe. Provider Reesa Chew - PA notified. Therapies notified. Patient placed on bed rest.  ?

## 2021-08-16 NOTE — Progress Notes (Signed)
Lower extremity venous bilateral, carotid duplex, and TCD study completed.  ? ?Please see CV Proc for preliminary results.  ? ?Darlin Coco, RDMS, RVT ? ?

## 2021-08-16 NOTE — Progress Notes (Signed)
Occupational Therapy Note ? ?Patient Details  ?Name: Edward Hopkins ?MRN: [id number] ?Date of Birth: May 06, 1951 ? ?Today's Date: 08/16/2021 ?OT Missed Time: 30 Minutes ?Missed Time Reason: Unavailable (comment);Other (comment) (off unit for further tests/procedures) ? ?Pt off unit for test/procedures. Pt missed 30 mins skilled OT services  ? ?[name] ?08/16/2021, 11:47 AM ?
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
## 2021-08-16 NOTE — Progress Notes (Addendum)
STROKE TEAM PROGRESS NOTE  ? ?INTERVAL HISTORY ?Patient seen in room, no family at the bedside.  ?Awaiting carotid dopplers and TCD. ASA 81mg  ordered daily, on exam, no focal neurological deficits noted. Patient is drowsy on exam, but follows commands and answers simple questions.  ?Patient denies any focal neurological symptoms suggestive of stroke.  As per rehab team notes practitioner Olin Hauser patient has been agitated requiring sedation and not cooperative for detailed exam and there is questionable right-sided weakness noted earlier which has been documented off and on.  MRI scan shows tiny weakly diffusion positive bifrontal and left cerebellar infarcts which looks subacute and probably related to his aortic dissection and surgical repair.  He has no focal neurological symptoms or deficits on exam today ? ?Vitals:  ? 08/15/21 0406 08/15/21 1355 08/15/21 1955 08/16/21 0523  ?BP: 135/70 135/68 117/66 (!) 108/59  ?Pulse: 81 80 86 74  ?Resp: 14 17 20 18   ?Temp: 98.9 ?F (37.2 ?C) 99 ?F (37.2 ?C) 98.7 ?F (37.1 ?C) 98.3 ?F (36.8 ?C)  ?TempSrc: Oral Oral Oral Oral  ?SpO2: 96% 100% 97% 100%  ?Weight:      ?Height:      ? ?CBC:  ?Recent Labs  ?Lab 08/12/21 ?0631 08/15/21 ?0704 08/16/21 ?0512  ?WBC 5.1 5.1 5.0  ?NEUTROABS 3.0  --   --   ?HGB 8.7* 8.5* 8.3*  ?HCT 26.7* 27.5* 26.0*  ?MCV 87.0 89.0 87.8  ?PLT 194 219 215  ? ?Basic Metabolic Panel:  ?Recent Labs  ?Lab 08/10/21 ?0154 08/11/21 ?0133 08/12/21 ?0631 08/15/21 ?1610  ?NA 138   < > 136 137  ?K 4.8   < > 3.9 4.3  ?CL 101   < > 97* 97*  ?CO2 24   < > 33* 33*  ?GLUCOSE 98   < > 105* 93  ?BUN 46*   < > 33* 31*  ?CREATININE 2.61*   < > 2.35* 2.37*  ?CALCIUM 8.3*   < > 8.2* 8.6*  ?MG 2.1  --   --   --   ? < > = values in this interval not displayed.  ? ?Lipid Panel:  ?Recent Labs  ?Lab 08/16/21 ?0512  ?CHOL 144  ?TRIG 153*  ?HDL 14*  ?CHOLHDL 10.3  ?VLDL 31  ?[place] 99  ? ?HgbA1c: No results for input(s): HGBA1C in the last 168 hours. ?Urine Drug Screen: No results for  input(s): LABOPIA, COCAINSCRNUR, LABBENZ, AMPHETMU, THCU, LABBARB in the last 168 hours.  ?Alcohol Level No results for input(s): ETH in the last 168 hours. ? ?IMAGING past 24 hours ?DG Chest 2 View ? ?Result Date: 08/15/2021 ?CLINICAL DATA:  Persistent chest pain and shortness of breath. EXAM: CHEST - 2 VIEW COMPARISON:  Chest x-ray 08/08/2021 FINDINGS: Cardiomediastinal silhouette is stable. Aortic graft/stent is present. Sternotomy wires are present. There are persistent left lower lobe patchy opacities. Questionable small left pleural effusion. Right lung is clear. No pneumothorax. Surgical clips overlie the right lung apex. No acute fractures are seen. IMPRESSION: 1. Persistent left basilar atelectasis/airspace disease with questionable small pleural effusion. Electronically Signed   By: Ronney Asters M.D.   On: 08/15/2021 17:05   ? ?PHYSICAL EXAM ? ?Physical Exam  ?Constitutional: Appears well-developed and well-nourished.  Elderly African-American male ?Cardiovascular: Normal rate and regular rhythm.  ?Respiratory: Effort normal, non-labored breathing ? ?Neuro: ?Mental Status: ?Patient is drowsy but oriented to person, place.  No dysarthria or aphasia or apraxia. ?Cranial Nerves: ?II: Visual Fields are full. Pupils are equal, round,  and reactive to light.   ?III,IV, VI: EOMI without ptosis or diploplia.  ?V: Facial sensation is symmetric to temperature ?VII: Facial movement is symmetric resting and smiling ?VIII: Hearing is intact to voice ?X: Palate elevates symmetrically ?XI: Shoulder shrug is symmetric. ?XII: Tongue protrudes midline without atrophy or fasciculations.  ?Motor: ?Tone is normal. Bulk is normal. 5/5 strength was present in all four extremities.  ?No weakness or drift. Difficulty lifting legs off the bed Plantarflexion and dorsiflexion strong and equal. Hand grasp strong and equal.  ?Sensory: ?Sensation is symmetric to light touch and temperature in the arms and legs. ? ? ?ASSESSMENT/PLAN ?Mr.  Edward Hopkins is a 71 y.o. male with history of HTN, recurrent aortic (type B) dissection s/p ascending stent repair (2016) and emergent descending stent repair (08/01/2021), HFpEF (08/02/21), past DVTs, polysubstance abuse (cocaine, benzo, EtOH, tobacco, cannabis), AoCKD3a. Currently on PMR with new right sided weakness subcortical strokes on MRI. Patient presented to [facility] (07/27/2021) for chest pain after cocaine use and was found to be in hypertensive emergency with acute recurrent aortic dissection (Type B) flap extending from the left subclavian to the SMA. 08/01/2021 hemorrhagic shock 2/2 aortic dissection rupture. Emergent intervention by [facility] Dr. Carlis Abbott. Repair of descending thoracic dissection with rupture with stent graft without coverage of the left subclavian artery. Stent graft of infrarenal abdominal aorta x2. Shock resulted in AoCKD managed by nephro. 08/15/2021 neurology consulted for new right-sided weakness and subcortical strokes x 3 on MRI. ?Awaiting carotid dopplers and TCD. ASA 81mg  ordered daily, on exam, no focal neurological deficits noted. Patient is drowsy on exam, but follows commands and answers simple questions.  ? ?Stroke: Subcortical left frontal and left cerebellar tiny punctate subacute infarcts likely secondary to aortic dissection and surgery for repair . ?3/24- Code Stroke CT head No acute abnormality. ?3/31- MRI  Three small foci of restricted diffusion consistent with acute/subacute infarcts, 2 in the left frontal lobe and 1 in the cerebellum ?Carotid Doppler bilateral 1-39% carotid stenosis only. ?Transcranial Doppler normal mean flow velocities throughout ?2D Echo EF 60-65% ?LDL 99 ?HgbA1c 5.5 ?VTE prophylaxis - lovenox 30mg   ?   ?Diet  ? Diet heart healthy/carb modified Room service appropriate? Yes with Assist; Fluid consistency: Thin  ? ? prior to admission, now on aspirin 81 mg daily.  ?Therapy recommendations:  CIR patient ?Disposition:  pending ? ?Hypertension ?Home meds:   None ?Stable ?Permissive hypertension (OK if < 220/120) but gradually normalize in 5-7 days ?Long-term BP goal normotensive ? ?Hyperlipidemia ?Home meds:  None ?LDL 99, goal < 70 ?Add statin when medically appropriate ?Continue statin at discharge ? ?Other Stroke Risk Factors ?Advanced Age >/= 55  ?Congestive heart failure ?Lasix 40mg  ? ?Other Active Problems ?Ruptured type B aortic aneurysm secondary to hypertensive emergency ?BP labile but overall stable/low; initiate antihypertensives if needed to control BP, but avoid hypotension given AKI  ?repeat CTA until 1 month postop at follow-up in the [facility] office ?Acute Kidney Injury ?Stable ?Cr 2.35 -> 2.37 ? ?Hospital day # 5 ? ?Patient seen and examined by NP/APP with MD. MD to update note as needed.  ? ?Janine Ores, DNP, FNP-BC ?[facility] ?Pager: [phone] ? ?STROKE MD NOTE :  ?I have personally obtained history,examined this patient, reviewed notes, independently viewed imaging studies, participated in medical decision making and plan of care.ROS completed by me personally and pertinent positives fully documented  I have made any additions or clarifications directly to the above note. Agree with note  above.  Patient had aortic dissection requiring emergent surgery couple of weeks ago and since then has been agitated with the definitive document neurological exam but has been found to have intermittent right-sided weakness.  MRI scan shows subacute tiny punctate frontal and left cerebellar infarcts which are likely related to the procedure.  Recommend aspirin for stroke prevention if stable from surgical standpoint.  Aggressive risk factor modification.  No family available for discussion.  Discussed with rehab team and is practitioner Olin Hauser.  Greater than 50% time during this 50-minute visit was spent on counseling and coordination of care and discussion with patient and care team about aortic dissection and strokes answering questions.  Stroke  team will sign off.  Kindly call for questions ? ?Antony Contras, MD ?Medical Director ?[facility] ?Pager: [phone] ?08/16/2021 4:29 PM ? ?To contact Stroke Continuity provider, please refer to

## 2021-08-16 NOTE — Patient Care Conference (Signed)
Inpatient RehabilitationTeam Conference and Plan of Care Update ?Date: 08/16/2021   Time: 11:14 AM  ? ? ?Patient Name: Edward Hopkins      ?Medical Record Number: [id number]  ?Date of Birth: Oct 17, 1950 ?Sex: Male         ?Room/Bed: 5V76H/6W73X-10 ?Payor Info: Payor: [employer] / Plan: [facility] MEDICARE / Product Type: *No Product type* /   ? ?Admit Date/Time:  08/11/2021  5:23 PM ? ?Primary Diagnosis:  Intramural aortic hematoma (HCC) ? ?Hospital Problems: Principal Problem: ?  Intramural aortic hematoma ([place]) ?Active Problems: ?  Aortic aneurysm rupture ([place]) ?  Chronic kidney disease (CKD), stage IV (severe) (HCC) ?  Debility ?  Cerebral embolism with cerebral infarction ? ? ? ?Expected Discharge Date: Expected Discharge Date: 08/31/21 ? ?Team Members Present: ?Physician leading conference: Dr. Courtney Heys ?Social Worker Present: [name], LCSWA ?Nurse Present: Dorthula Nettles, RN ?PT Present: Excell Seltzer, PT ?OT Present: Roanna Epley, Claris Gladden, OT ?PPS Coordinator present : Gunnar Fusi, SLP ? ?   Current Status/Progress Goal Weekly Team Focus  ?Bowel/Bladder ? ? continent/incontinent B/B  regain continence  toilet q 2hr and prn   ?Swallow/Nutrition/ Hydration ? ?           ?ADL's ? ? bed mobility-mod A; transfers-mod A RW or Stedy for energy conservation; BADLs-mod A/max A  min A overall  activity tolerance, sitting balance, BADL trasining, transfers, education   ?Mobility ? ? mod A bed mobility, mod A transfers with RW vs stedy for time/energy conservation  CGA to min A  sitting balance, bed mobility, transfers, standing and gait initiation as able   ?Communication ? ?           ?Safety/Cognition/ Behavioral Observations ?           ?Pain ? ? reports pain from hematoma  < 3  assess pain q 4hr and prn   ?Skin ? ? right groin incision, left thigh incision  no new breakdown  assess skin q shift and prn   ? ? ?Discharge Planning:  ?D/c to home with s/o [name] who can provide supervision,  and reports there is a health aide, hours vary, about 15 hours a week. SW will confirm there are no barriers to discharge.   ?Team Discussion: ?Reports pain related to hematoma. Nursing to offer pain medication. Wean O2 as tolerated. Area on RUE appears to be tissue necrosis. Decreased bowel medications. New stroke diagnosis. Confusion improved. Continent/incontinent B/B, skin incisions CDI. Unsure of discharge plans, S/O hasn't returned SW phone call.  ? ?Patient on target to meet rehab goals: ?yes, CGA/min assist goals. Currently mod assist bed mobility, transfers. Max assist to EOB. Limited by fatigue. ? ?*See Care Plan and progress notes for long and short-term goals.  ? ?Revisions to Treatment Plan:  ?Adjusting medications, consulted Spiritual Care. ?  ?Teaching Needs: ?Family education, medication/pain management, skin/wound care, bowel/bladder management, transfer/gait training, etc. ?  ?Current Barriers to Discharge: ?Decreased caregiver support, Home enviroment access/layout, Incontinence, Wound care, and Lack of/limited family support ? ?Possible Resolutions to Barriers: ?Family education ?Order recommended DME ?Follow-up Outpatient therapy ?  ? ? Medical Summary ?Current Status: still on new O2 by [place]; incontinent of B/B- stool multiple times due to meds/from ileus; pain is limiting therapy- ? Barriers to Discharge: Medical stability;Decreased family/caregiver support;Home enviroment access/layout;Incontinence;Neurogenic Bowel & Bladder;New oxygen;Other (comments);Wound care;Medication compliance ? Barriers to Discharge Comments: pain is an issue- hx of polysubstance abuse- new;y dx'd stroke with R hemiparesis;  partner hasn't called back SW- has an aide at home? ?Possible Resolutions to [employer]: discussed giving up using illicits- very inconitnent- mod A- goals CGA-min A- limited by fatigue and Chest pain- cannot partiicipate- not doing a lot- is max A right now- focus finding out if has PE-  working with vascular about f/u CTA- d/c- set for 4/19 ? ? ?Continued Need for Acute Rehabilitation Level of Care: The patient requires daily medical management by a physician with specialized training in physical medicine and rehabilitation for the following reasons: ?Direction of a multidisciplinary physical rehabilitation program to maximize functional independence : Yes ?Medical management of patient stability for increased activity during participation in an intensive rehabilitation regime.: Yes ?Analysis of laboratory values and/or radiology reports with any subsequent need for medication adjustment and/or medical intervention. : Yes ? ? ?I attest that I was present, lead the team conference, and concur with the assessment and plan of the team. ? ? ?Dorthula Nettles G ?08/16/2021, 5:01 PM  ? ? ? ? ? ? ?

## 2021-08-16 NOTE — Progress Notes (Signed)
Discussed patient with Dr. Donzetta Matters who feels that patient is high risk for rebleed. There is one area he was not able to stent and is still leaking-->if AC absolutely needed to start on low dose heparin with CBC every  hours to closely monitor for drop in H/H as this could be devastating. BLE dopplers ordered to check for DVT but he does already have IVC filter in place.  ? ?Palliative care consulted to help discuss [name] with patient as he is full code and at risk for decompensation as continues to have issues with abdominal pain and/or chest pain. Poor intake, fatigue and bouts of lethargy. Patient more cognitively aware and alert this am per Dr. Dagoberto Ligas.  Will contact PCCM for recs also.   ?

## 2021-08-16 NOTE — Progress Notes (Signed)
Physical Therapy Session Note ? ?Patient Details  ?Name: Edward Hopkins ?MRN: [id number] ?Date of Birth: 07/05/1950 ? ?Today's Date: 08/16/2021 ?PT Individual Time: 3016-0109 ?PT Individual Time Calculation (min): 45 min  ? ?Short Term Goals: ?Week 1:  PT Short Term Goal 1 (Week 1): Pt will complete least restrictive transfer with min A consistently ?PT Short Term Goal 2 (Week 1): Pt will ambulate x 25 ft with LRAD and min A ?PT Short Term Goal 3 (Week 1): Pt will initiate stair training as safe and able ? ?Skilled Therapeutic Interventions/Progress Updates:  ?Pt received seated in bed having just finished pericare with assist from NT. Pt agreeable to participate in therapy, does report ongoing abdominal pain. Pt indicates he is not having chest pain this AM. Nursing able to provide pain medication at beginning of therapy session. Supine BP 112/66, HR 74 bpm, SpO2 98% while on 3L O2 via [place]. Pt initially agreeable to get out of bed to transfer to either recliner or w/c but then declines one equipment setup due to discomfort sitting in either of these chairs. Pt is agreeable to attempt to sit up to EOB. Supine to sit with max A needed for BLE management and trunk elevation, increased time needed to complete. Pt tolerates sitting EOB x 5 min with min A for support. Pt able to perform several scoots to the R towards [place] with min A. Pt requests to return to supine. Sit to supine mod A needed for BLE management. Pt declines further intervention due to fatigue. Pt left seated in bed with needs in reach, bed alarm in place. Pt missed 15 min of scheduled therapy session due to fatigue. ? ?Therapy Documentation ?Precautions:  ?Precautions ?Precautions: Fall ?Precaution Comments: mild R hemipareisis, 2L O2, slow to initiate ?Restrictions ?Weight Bearing Restrictions: No ?RLE Weight Bearing: Non weight bearing ?General: ?PT Amount of Missed Time (min): 15 Minutes ?PT Missed Treatment Reason: Patient  fatigue;Pain ? ? ? ? ? ?Therapy/Group: Individual Therapy ? ? ?Excell Seltzer, PT, DPT, CSRS ? ?08/16/2021, 1:12 PM  ?

## 2021-08-16 NOTE — Progress Notes (Signed)
?                                                       PROGRESS NOTE ? ? ?Subjective/Complaints: ? ?Feels better this AM.  ? ?RUE feels worse, though ?Also c/o severe back pain.  ?LBM yesterday.,  ?Didn't get VQ scan yet- cannot get CTA due to Cr/kidney function.  ? ? ?ROS- ? ?Pt denies SOB, abd pain, CP, N/V/C/D, and vision changes ?(+) back pain ? ?Objective: ?  ?DG Chest 2 View ? ?Result Date: 08/15/2021 ?CLINICAL DATA:  Persistent chest pain and shortness of breath. EXAM: CHEST - 2 VIEW COMPARISON:  Chest x-ray 08/08/2021 FINDINGS: Cardiomediastinal silhouette is stable. Aortic graft/stent is present. Sternotomy wires are present. There are persistent left lower lobe patchy opacities. Questionable small left pleural effusion. Right lung is clear. No pneumothorax. Surgical clips overlie the right lung apex. No acute fractures are seen. IMPRESSION: 1. Persistent left basilar atelectasis/airspace disease with questionable small pleural effusion. Electronically Signed   By: Ronney Asters M.D.   On: 08/15/2021 17:05   ?Recent Labs  ?  08/15/21 ?0704 08/16/21 ?0512  ?WBC 5.1 5.0  ?HGB 8.5* 8.3*  ?HCT 27.5* 26.0*  ?PLT 219 215  ? ?Recent Labs  ?  08/15/21 ?0704  ?NA 137  ?K 4.3  ?CL 97*  ?CO2 33*  ?GLUCOSE 93  ?BUN 31*  ?CREATININE 2.37*  ?CALCIUM 8.6*  ? ? ?Intake/Output Summary (Last 24 hours) at 08/16/2021 0951 ?Last data filed at 08/16/2021 0900 ?Gross per 24 hour  ?Intake 240 ml  ?Output 450 ml  ?Net -210 ml  ?  ? ?  ? ?Physical Exam: ?Vital Signs ?Blood pressure (!) 108/59, pulse 74, temperature 98.3 ?F (36.8 ?C), temperature source Oral, resp. rate 18, height 5\' 8"  (1.727 m), weight 86.8 kg, SpO2 100 %. ? ? ? ? ?General: awake, alert, appropriate, laying in bed; appears really uncomfortable- pain with mild ROM of back; NAD ?HENT: conjugate gaze; oropharynx moist ?CV: regular rate; no JVD- TTP over lower sternum/epigastric ?Pulmonary: CTA B/L; no W/R/R- good air movement ?GI: soft, NT, ND, (+)BS ?Psychiatric:  appropriate but frustrated about pain ?Neurological: more oriented ? Skin: RUE less swelling trace to 1+- hand - esp hand better, but possible tissue necrosis- looks more erythematous- not raised ?LUE 5/5 ?RUE 4+/5 delt bi, tri grip ?LLE- 5/5 ?RLE- 5-/5- weaker on R side  ?Ext  ?2+ LE edema to mid calf and 1-2+ RUE swelling  ?Neurological:  ?   Mental Status: He is oriented to person, place, and time.  ?   Comments: Left facial weakness. Lethargic and tended to drift off. Internally distracted and needed cues to follow simple motor commands. He was able to use calender independently to recall date. Able to recall age and DOB.   ?Sleepy, but Ox3- knew at [facility] cone and why; and month/year ?Sensation intact in all 4 extremities  ?Assessment/Plan: ?1. Functional deficits which require 3+ hours per day of interdisciplinary therapy in a comprehensive inpatient rehab setting. ?Physiatrist is providing close team supervision and 24 hour management of active medical problems listed below. ?Physiatrist and rehab team continue to assess barriers to discharge/monitor patient progress toward functional and medical goals ? ?Care Tool: ? ?Bathing ?   ?Body parts bathed by patient: Chest, Abdomen, Front perineal area, Left  arm, Right arm, Face  ? Body parts bathed by helper: Buttocks, Right upper leg, Left upper leg, Left lower leg, Right lower leg ?  ?  ?Bathing assist Assist Level: Moderate Assistance - Patient 50 - 74% ?  ?  ?Upper Body Dressing/Undressing ?Upper body dressing   ?What is the patient wearing?: Pull over shirt ?   ?Upper body assist Assist Level: Maximal Assistance - Patient 25 - 49% ?   ?Lower Body Dressing/Undressing ?Lower body dressing ? ? ?   ?What is the patient wearing?: Pants, Incontinence brief ? ?  ? ?Lower body assist Assist for lower body dressing: Maximal Assistance - Patient 25 - 49% ?   ? ?Toileting ?Toileting    ?Toileting assist Assist for toileting: 2 Helpers ?  ?  ?Transfers ?Chair/bed  transfer ? ?Transfers assist ?   ? ?Chair/bed transfer assist level: Minimal Assistance - Patient > 75% ?  ?  ?Locomotion ?Ambulation ? ? ?Ambulation assist ? ? Ambulation activity did not occur: Safety/medical concerns ? ?  ?  ?   ? ?Walk 10 feet activity ? ? ?Assist ? Walk 10 feet activity did not occur: Safety/medical concerns ? ?  ?   ? ?Walk 50 feet activity ? ? ?Assist Walk 50 feet with 2 turns activity did not occur: Safety/medical concerns ? ?  ?   ? ? ?Walk 150 feet activity ? ? ?Assist Walk 150 feet activity did not occur: Safety/medical concerns ? ?  ?  ?  ? ?Walk 10 feet on uneven surface  ?activity ? ? ?Assist Walk 10 feet on uneven surfaces activity did not occur: Safety/medical concerns ? ? ?  ?   ? ?Wheelchair ? ? ? ? ?Assist Is the patient using a wheelchair?: Yes ?Type of Wheelchair: Manual ?  ? ?  ?   ? ? ?Wheelchair 50 feet with 2 turns activity ? ? ? ?Assist ? ?  ?Wheelchair 50 feet with 2 turns activity did not occur: Safety/medical concerns ? ? ?   ? ?Wheelchair 150 feet activity  ? ? ? ?Assist ? Wheelchair 150 feet activity did not occur: Safety/medical concerns ? ? ?   ? ?Blood pressure (!) 108/59, pulse 74, temperature 98.3 ?F (36.8 ?C), temperature source Oral, resp. rate 18, height 5\' 8"  (1.727 m), weight 86.8 kg, SpO2 100 %. ? ?Medical Problem List and Plan: ?1. Functional deficits secondary to debility from  thoracic dissection of aneurysm  ?            -patient may  shower ?            -ELOS/Goals: 12-14 days min A to supervision ? Continue CIR- PT, OT and SLP ? Team conference today to determine length of stay ?2.  Antithrombotics: ?-DVT/anticoagulation:  Pharmaceutical: Lovenox. Will order dopplers of RUE.  ?            -antiplatelet therapy: N/A ?3. Pain Management:  oxycodone prn  ? 4/4- having a lot of lower chest and back pain- con't pain meds- will add lidocaine patches for night time for back pain.  ?4. Mood: LCSW to follow for evaluation and support.  ?             -antipsychotic agents: N/A ?5. Neuropsych: This patient may be intermittently capable of making decisions on his own behalf. ?6. Skin/Wound Care: Routine pressure relief measures.  Monitor areas on right forearm/wrist for demarcation ?            -  will add Vitamin C and Zinc to promote healing.  ?7. Fluids/Electrolytes/Nutrition: Monitor I/O. Check lytes in am.  ?8. Ruptured  descending thoracic aneurysm s/p repair: BP management.  ?            --will need repeat CTA 4 weeks (monitoring SCr  for improvement) ?9. Acute on chronic renal failure: with new CKD Stage IV Improvement in BUN/SCr- 39/2.38 ?            --monitor with serial checks. Avoid nephrotoxic medications.  ? 3/31- Cr and BUN stable con't to monitor weekly.  ? 4/3- Cr still 2.3-2.4- is stable- per Vascular wants pt kidney function to improve some, before CTA< however need to check for PE/so will do both ? 4/4- will check labs qM/H ?10. Acute blood loss anemia: Recheck CBC in am.  ? 4/3- Hb very slightly less at 8.5- will monitor ?11. Hypoxia: Wean oxygen as tolerated. Monitor for signs of overload ?            --Off diuresis. Resp rate increase with minimal movement- will monitor O2 sats.  ? 3/31- monitor closely, since  SOB with minimal exertion. ?4/3- still requiring O2- will check for PE this evening- since pt's still has DOE.  ?4/4- Couldn't do CTA- so ordered VQ scabn- pending.   ?12. ABLA: Improving with Epogen and thrombocytopenia has resolved.   ? 3/31- Hb 8.7- stable to slightly down- was 9.1 a few days ago- ? 4/3- Hb 8.5- overall stable ?13. Polysubstance abuse: Educated on importance of cessation ?            --smokes > 1 PPD and marijuana/cocaine-->Has planned to quit.  ?14. Obesity: BMI 30.- initially documented as BMI of 68- was wrong- Educate on diet and importance of weight loss to help promote mobility and overall health.  ?15. Ileus: Has been refusing daily suppository--last BM on 03/25.  ?--Intake poor--0-50% in the past 24 hours with  abdominal distension. ?--Will increase Miralax to bid.Check KUB for follow up.  ?3/31- KUB looks better, but still distended loops of bowel- not eating much- will start Reglan 5 mg TID for the moment and see if things improve-

## 2021-08-16 NOTE — Progress Notes (Signed)
Girl friend updated on current results as well as concerns and aware that Palliative care consulted to discuss [place].  ?

## 2021-08-16 NOTE — Progress Notes (Signed)
Patient ID: Edward Hopkins, male   DOB: Aug 15, 1950, 71 y.o.   MRN: [id number] ? ?SW met with pt in room to provide updates from team conference, and d/c date 4/19. Pt would like for his d/c date to be sooner, otherwise, willing to stay. He is aware SW will follow-up with his partner [name].  ? ?1516-SW spoke with pt partner [name] to provide above updates. Confirms the only number she uses is her home phone and not cell phone. She confirms assistance will be needed with him when he goes home as she is not physically able to assist him. She is unsure if the aide will still be working with him. SW shared will submit new PCS referral close towards discharge to ensure he has help. She is aware SW will continue to provide updates.  ? ?[name], MSW, LCSWA ?Office: [phone] ?Cell: [phone] ?Fax: [phone]  ?

## 2021-08-16 NOTE — Progress Notes (Signed)
Occupational Therapy Note ? ?Patient Details  ?Name: Edward Hopkins ?MRN: [id number] ?Date of Birth: March 21, 1951 ? ?Today's Date: 08/16/2021 ?OT Missed Time: 60 Minutes ?Missed Time Reason: CT/MRI ? ?Pt off unit for test/procedure. Pt missed 60 mins skilled OT services.  ? ?[name] ?08/16/2021, 10:25 AM ?

## 2021-08-17 DIAGNOSIS — I6310 Cerebral infarction due to embolism of unspecified precerebral artery: Secondary | ICD-10-CM

## 2021-08-17 DIAGNOSIS — Z515 Encounter for palliative care: Secondary | ICD-10-CM

## 2021-08-17 DIAGNOSIS — Z7189 Other specified counseling: Secondary | ICD-10-CM

## 2021-08-17 LAB — GLUCOSE, CAPILLARY
Glucose-Capillary: 106 mg/dL — ABNORMAL HIGH (ref 70–99)
Glucose-Capillary: 102 mg/dL — ABNORMAL HIGH (ref 70–99)
Glucose-Capillary: 94 mg/dL (ref 70–99)
Glucose-Capillary: 98 mg/dL (ref 70–99)

## 2021-08-17 LAB — HEMOGLOBIN A1C
Hgb A1c MFr Bld: 5.4 % (ref 4.8–5.6)
Mean Plasma Glucose: 108 mg/dL

## 2021-08-17 MED ORDER — HYDROCERIN EX CREA
TOPICAL_CREAM | Freq: Two times a day (BID) | CUTANEOUS | Status: DC
  Filled 2021-08-17 (×2): qty 113

## 2021-08-17 NOTE — Consult Note (Signed)
?Consultation Note ?Date: 08/17/2021  ? ?Patient Name: Edward Hopkins  ?DOB: 27-Apr-1951  MRN: [id number]  Age / Sex: 71 y.o., male  ?PCP: Pcp, No ?Referring Physician: Courtney Heys, MD ? ?Reason for Consultation: Establishing goals of care ? ?HPI/Patient Profile: 71 y.o. male  with past medical history of CHF, DVT, HTN, IVC filter, and polysubstance abuse admitted to [facility] 08/11/2021 following hospital admission for  ruptured type B aortic aneurysm secondary to hypertensive emergency requiring emergent repair and with subsequent respiratory failure, intubation and shock. Since Rehab admission, he was seen by Neurology for R sided weakness and found to have multifocal embolic appearing subcortical strokes in the L cerebellum and L frontal lobe, likely from dissection flap.  He also c/o back and chest pain and a V/Q scan was obtained that showed concern for acute PE in the RML. No anticoagulation has been recommended as would be very high risk. PMT consulted to discuss [place].  ? ?Clinical Assessment and Goals of Care: ?I have reviewed medical records including EPIC notes, labs and imaging, received report from RN, assessed the patient and then met with patient  to discuss diagnosis prognosis, GOC, EOL wishes, disposition and options. ? ?During my visit patient is complaining of abdominal pain.  Nursing staff at bedside and patient was assisted to more comfortable position and nursing staff also administered pain medication.  Patient attempting to eat lunch during my visit but was limited by pain and had to lie back down in bed. ? ?I introduced Palliative Medicine as specialized medical care for people living with serious illness. It focuses on providing relief from the symptoms and stress of a serious illness. The goal is to improve quality of life for both the patient and the family. ? ?Patient tells me some about his background.  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  He tells me about his significant other Edward Hopkins.  He tells me how important his extended family such as nieces and nephews are to him. ? ?He tells me how shocking current situation is as prior to his aneurysm rupture he lived what he describes a normal life and was able to independently care for himself. ? ? We discussed patient's current illness and what it means in the larger context of patient's on-going co-morbidities.  Natural disease trajectory and expectations at EOL were discussed.  We reviewed his ruptured aneurysm, strokes, and PE.  We reviewed concerns of restarting anticoagulation.  We reviewed limitations of medicine.  Patient expresses that he understands the severity of his illness. ? ?I attempted to elicit values and goals of care important to the patient.  Patient shares that he really does not want to suffer and would like his pain adequately controlled.  He tells me at this point he is mostly thinking about seeing his family.  Patient shares that he has several nieces and nephews coming to visit him tomorrow and right now he is focused on living until he can see them. ? ?The difference between aggressive medical intervention and comfort care was considered in light of the patient's goals of care.  For now would like to continue aggressive medical interventions until he can see his family.  He tells me after he is able to visit with family he would like to further discuss how to approach his care.  We reviewed CODE STATUS and he again shares for now he would like to remain full code but would like to discuss this further after visiting  his family. ? ?Discussed with patient the importance of continued conversation with family and the medical providers regarding overall plan of care and treatment options, ensuring decisions are within the context of the patient?s values and GOCs.   ? ? ? ?Questions and concerns were addressed. The family was encouraged to call with questions or concerns.   ? ? ?Primary Decision Maker ?PATIENT ?Significant other Edward Hopkins is listed as HCPOA if patient becomes unable to make decisions independently ? ?SUMMARY OF RECOMMENDATIONS   ?-Continue current measures for now as patient is expecting several nieces and nephews to visit him tomorrow 4/6-he tells me he is interested in further conversations about goals of care with the palliative medicine team after he is able to visit with family ?-Please note that patient has advance directives in [place] -living will and healthcare power of attorney ?-We will request member of palliative medicine team to follow-up with patient tomorrow ? ?Code Status/Advance Care Planning: ?Patient would like to continue full code for now but seems to indicate he may change this after he is able to visit with family tomorrow ? ?  ? ?Primary Diagnoses: ?Present on Admission: ? Intramural aortic hematoma ([place]) ? Aortic aneurysm rupture ([place]) ? Chronic kidney disease (CKD), stage IV (severe) (HCC) ? Debility ? ? ?I have reviewed the medical record, interviewed the patient and family, and examined the patient. The following aspects are pertinent. ? ?Past Medical History:  ?Diagnosis Date  ? CHF (congestive heart failure) ([place])   ? Constipation   ? DVT (deep venous thrombosis) ([place]) 05/15/1993  ? Endocarditis   ? Hypertension   ? Intramural aortic hematoma (HCC)   ? Osteomyelitis ([place])   ? Presence of IVC filter   ? Right femoral vein DVT ([place]) 07/14/2014  ? Septic arthritis ([place])   ? both hips  (08/04/2014)  ? ?Social History  ? ?Socioeconomic History  ? Marital status: Single  ?  Spouse name: Not on file  ? Number of children: Not on file  ? Years of education: Not on file  ? Highest education level: Not on file  ?Occupational History  ? Not on file  ?Tobacco Use  ? Smoking status: Every Day  ?  Packs/day: 1.00  ?  Years: 50.00  ?  Pack years: 50.00  ?  Types: Cigarettes  ? Smokeless tobacco: Never  ?Substance and Sexual Activity  ? Alcohol use: Yes  ?   Alcohol/week: 2.0 standard drinks  ?  Types: 2 Cans of beer per week  ?  Comment: 08/04/2014 "drink 2 weekends out of the month; maybe 3-4 shots and some beer"  ? Drug use: Yes  ?  Types: Cocaine, Marijuana  ?  Comment: 08/04/2014 "only use cocaine on the weekends; not q weekend; smoke marijuana maybe once/month, if that"  ? Sexual activity: Not on file  ?Other Topics Concern  ? Not on file  ?Social History Narrative  ? Not on file  ? ?Social Determinants of Health  ? ?Financial Resource Strain: Not on file  ?Food Insecurity: Not on file  ?Transportation Needs: Not on file  ?Physical Activity: Not on file  ?Stress: Not on file  ?Social Connections: Not on file  ? ?History reviewed. No pertinent family history. ?Scheduled Meds: ? vitamin C  500 mg Oral BID  ? aspirin EC  81 mg Oral Daily  ? bisacodyl  10 mg Rectal Daily  ? darbepoetin (ARANESP) injection - NON-DIALYSIS  100 mcg Subcutaneous Q Tue-1800  ? enoxaparin (LOVENOX)  injection  30 mg Subcutaneous Daily  ? feeding supplement  1 Container Oral TID WC  ? folic acid  1 mg Oral Daily  ? furosemide  40 mg Oral Daily  ? hydrocerin   Topical BID  ? lidocaine  2 patch Transdermal Q24H  ? metoCLOPramide  5 mg Oral BID  ? multivitamin with minerals  1 tablet Oral Daily  ? nutrition supplement (JUVEN)  1 packet Oral BID BM  ? pantoprazole  40 mg Oral Daily  ? polyethylene glycol  17 g Oral Daily  ? simethicone  80 mg Oral QID  ? thiamine  100 mg Oral Daily  ? zinc sulfate  220 mg Oral Daily  ? ?Continuous Infusions: ?PRN Meds:.acetaminophen, alum & mag hydroxide-simeth, artificial tears, bisacodyl, diphenhydrAMINE, guaiFENesin-dextromethorphan, ipratropium-albuterol, oxyCODONE, phenol, polyethylene glycol, prochlorperazine **OR** prochlorperazine **OR** prochlorperazine, sodium phosphate, traZODone ?No Known Allergies ?Review of Systems  ?Constitutional:  Positive for activity change, appetite change and fatigue.  ?Gastrointestinal:  Positive for abdominal pain.   ? ?Physical Exam ?Constitutional:   ?   Comments: Drowsy but able to engage in conversation appropriately  ?Pulmonary:  ?   Effort: Pulmonary effort is normal.  ?Skin: ?   General: Skin is warm and dry.  ?Neurological

## 2021-08-17 NOTE — Progress Notes (Signed)
Physical Therapy Session Note ? ?Patient Details  ?Name: Edward Hopkins ?MRN: [id number] ?Date of Birth: 1951/02/10 ? ?Today's Date: 08/17/2021 ?PT Individual Time: 3419-6222 ?PT Individual Time Calculation (min): 15 min  ? ?Short Term Goals: ?Week 1:  PT Short Term Goal 1 (Week 1): Pt will complete least restrictive transfer with min A consistently ?PT Short Term Goal 2 (Week 1): Pt will ambulate x 25 ft with LRAD and min A ?PT Short Term Goal 3 (Week 1): Pt will initiate stair training as safe and able ? ?Skilled Therapeutic Interventions/Progress Updates:  ?  Pt received seated in bed with lunch in front of him, has not eaten and reports he has no appetite. Assessed pt's vitals in semi-reclined position, WNL. BP 132/61, HR 76 bpm, SpO2 99% while on 3L O2. Pt reports ongoing pain in his abdomen, declines intervention. Encouraged pt to sit up to EOB this PM, pt declines and declines any participation in therapy session due to fatigue and not feeling well overall. Pt requests assistance to call his significant other. Assisted pt with phone management. Pt left seated in bed with needs in reach, bed alarm in place. Pt missed 60 min of scheduled therapy session due to refusal. ? ?Therapy Documentation ?Precautions:  ?Precautions ?Precautions: Fall ?Precaution Comments: mild R hemipareisis, 2L O2, slow to initiate ?Restrictions ?Weight Bearing Restrictions: No ?RLE Weight Bearing: Non weight bearing ?General: ?PT Amount of Missed Time (min): 60 Minutes ?PT Missed Treatment Reason: Patient fatigue;Patient unwilling to participate ? ? ? ? ? ?Therapy/Group: Individual Therapy ? ? ?Excell Seltzer, PT, DPT, CSRS ?08/17/2021, 1:18 PM  ?

## 2021-08-17 NOTE — Progress Notes (Signed)
Physical Therapy Note ? ?Patient Details  ?Name: Edward Hopkins ?MRN: [id number] ?Date of Birth: 1950/12/30 ?Today's Date: 08/17/2021 ? ? ? ?Pt's plan of care adjusted to 15/7 after speaking with care team and discussed with MD as pt currently unable to tolerate current therapy schedule with OT and PT. ?  ? ? ?Excell Seltzer, PT, DPT, CSRS ?08/17/2021, 12:33 PM  ?

## 2021-08-17 NOTE — Progress Notes (Signed)
Physical Therapy Note ? ?Patient Details  ?Name: GUST EUGENE ?MRN: [id number] ?Date of Birth: Feb 15, 1951 ?Today's Date: 08/17/2021 ? ? ? ?Pt missed 75 min skilled PT due to pt on medical hold/bedrest 2/2  PE. Will continue efforts once medically cleared to participate.  ? ?Katherinne Mofield ?08/17/21 ?  ? ?Santana Gosdin ?08/17/2021, 8:40 AM  ?

## 2021-08-17 NOTE — Progress Notes (Signed)
?                                                       PROGRESS NOTE ? ? ?Subjective/Complaints: ? ?Has PE- on bedrest- can switch to  activity with therapy-  ?Pulmonary will follow, but no DVT, so they don't want pt on [facility]- which makes sense, because high risk of bleeding.  ? ?LBM yesterday. Nails really long- scratching self.  ? ?Still requiring O2.  ? ?ROS- ? ?Pt denies (+) SOB- stable, abd pain, CP, N/V/C/D, and vision changes ? ? ?Objective: ?  ?DG Chest 2 View ? ?Result Date: 08/15/2021 ?CLINICAL DATA:  Persistent chest pain and shortness of breath. EXAM: CHEST - 2 VIEW COMPARISON:  Chest x-ray 08/08/2021 FINDINGS: Cardiomediastinal silhouette is stable. Aortic graft/stent is present. Sternotomy wires are present. There are persistent left lower lobe patchy opacities. Questionable small left pleural effusion. Right lung is clear. No pneumothorax. Surgical clips overlie the right lung apex. No acute fractures are seen. IMPRESSION: 1. Persistent left basilar atelectasis/airspace disease with questionable small pleural effusion. Electronically Signed   By: Ronney Asters M.D.   On: 08/15/2021 17:05  ? ?NM Pulmonary Perfusion ? ?Result Date: 08/16/2021 ?CLINICAL DATA:  Chest pain. Short of breath. Concern for pulmonary embolism. EXAM: NUCLEAR MEDICINE PERFUSION LUNG SCAN TECHNIQUE: Perfusion images were obtained in multiple projections after intravenous injection of radiopharmaceutical. RADIOPHARMACEUTICALS:  5.1 mCi Tc-54m MAA COMPARISON:  Chest radiograph 08/15/2021 FINDINGS: There is a wedge-shaped peripheral perfusion defect within the middle segment of the RIGHT middle lobe. No corresponding abnormality on comparison chest radiograph. Regional decreased perfusion in the LEFT lower lobe is favored related to effusion and cardiomegaly on radiograph. IMPRESSION: Concern for acute pulmonary embolism to the RIGHT middle lobe. Electronically Signed   By: Suzy Bouchard M.D.   On: 08/16/2021 12:31  ? ?ECHOCARDIOGRAM  COMPLETE ? ?Result Date: 08/16/2021 ?   ECHOCARDIOGRAM REPORT   Patient Name:   Edward Hopkins Date of Exam: 08/16/2021 Medical Rec #:  [id number]     Height:       68.0 in Accession #:    [id number]    Weight:       191.4 lb Date of Birth:  Sep 24, 1950     BSA:          2.006 m? Patient Age:    71 years      BP:           121/63 mmHg Patient Gender: M             HR:           68 bpm. Exam Location:  Inpatient Procedure: 2D Echo STAT ECHO Indications:    pulmonary embolus  History:        Patient has prior history of Echocardiogram examinations, most                 recent 08/02/2021. Ascending aortic root replacement, chronic                 kidney disease; Risk Factors:Hypertension.  Sonographer:    Johny Chess RDCS Referring Phys: [id number] [place]  1. Left ventricular ejection fraction, by estimation, is 60 to 65%. The left ventricle has normal function. The left ventricle has no regional wall motion abnormalities. There is  mild concentric left ventricular hypertrophy. Left ventricular diastolic parameters are consistent with Grade II diastolic dysfunction (pseudonormalization). Elevated left atrial pressure.  2. Right ventricular systolic function is normal. The right ventricular size is normal. There is normal pulmonary artery systolic pressure.  3. Left atrial size was mildly dilated.  4. The mitral valve is normal in structure. Mild mitral valve regurgitation.  5. The aortic valve is tricuspid. There is mild calcification of the aortic valve. There is mild thickening of the aortic valve. Aortic valve regurgitation is mild. Aortic valve sclerosis is present, with no evidence of aortic valve stenosis.  6. Ascending aorta appears to be s/p graft repair, with normal caliber. Aortic dilatation noted. There is borderline dilatation of the aortic root, measuring 39 mm.  7. The inferior vena cava is normal in size with greater than 50% respiratory variability, suggesting right atrial pressure of 3  mmHg. Comparison(s): No significant change from prior study. Prior images reviewed side by side. FINDINGS  Left Ventricle: Left ventricular ejection fraction, by estimation, is 60 to 65%. The left ventricle has normal function. The left ventricle has no regional wall motion abnormalities. The left ventricular internal cavity size was normal in size. There is  mild concentric left ventricular hypertrophy. Left ventricular diastolic parameters are consistent with Grade II diastolic dysfunction (pseudonormalization). Elevated left atrial pressure. Right Ventricle: The right ventricular size is normal. No increase in right ventricular wall thickness. Right ventricular systolic function is normal. There is normal pulmonary artery systolic pressure. The tricuspid regurgitant velocity is 2.11 m/s, and  with an assumed right atrial pressure of 3 mmHg, the estimated right ventricular systolic pressure is 61.4 mmHg. Left Atrium: Left atrial size was mildly dilated. Right Atrium: Right atrial size was normal in size. Pericardium: There is no evidence of pericardial effusion. Mitral Valve: The mitral valve is normal in structure. Mild mitral valve regurgitation, with centrally-directed jet. Tricuspid Valve: The tricuspid valve is normal in structure. Tricuspid valve regurgitation is trivial. Aortic Valve: The aortic valve is tricuspid. There is mild calcification of the aortic valve. There is mild thickening of the aortic valve. Aortic valve regurgitation is mild. Aortic regurgitation PHT measures 460 msec. Aortic valve sclerosis is present,  with no evidence of aortic valve stenosis. Pulmonic Valve: The pulmonic valve was normal in structure. Pulmonic valve regurgitation is not visualized. Aorta: Ascending aorta appears to be s/p graft repair, with normal caliber. Aortic dilatation noted. There is borderline dilatation of the aortic root, measuring 39 mm. Venous: The inferior vena cava is normal in size with greater than 50%  respiratory variability, suggesting right atrial pressure of 3 mmHg. IAS/Shunts: No atrial level shunt detected by color flow Doppler.  LEFT VENTRICLE PLAX 2D LVIDd:         4.70 cm      Diastology LVIDs:         3.10 cm      LV e' medial:    6.85 cm/s LV PW:         1.10 cm      LV E/e' medial:  13.6 LV IVS:        1.20 cm      LV e' lateral:   7.40 cm/s LVOT diam:     2.30 cm      LV E/e' lateral: 12.6 LV SV:         73 LV SV Index:   36 LVOT Area:     4.15 cm?  LV Volumes (MOD) LV vol  d, MOD A2C: 128.0 ml LV vol s, MOD A2C: 52.0 ml LV SV MOD A2C:     76.0 ml RIGHT VENTRICLE             IVC RV S prime:     10.20 cm/s  IVC diam: 1.50 cm TAPSE (M-mode): 1.8 cm LEFT ATRIUM             Index        RIGHT ATRIUM           Index LA diam:        3.40 cm 1.69 cm/m?   RA Area:     16.70 cm? LA Vol (A2C):   53.5 ml 26.67 ml/m?  RA Volume:   45.80 ml  22.83 ml/m? LA Vol (A4C):   61.0 ml 30.41 ml/m? LA Biplane Vol: 60.5 ml 30.16 ml/m?  AORTIC VALVE LVOT Vmax:   101.00 cm/s LVOT Vmean:  61.900 cm/s LVOT VTI:    0.176 m AI PHT:      460 msec  AORTA Ao Root diam: 3.90 cm Ao Asc diam:  3.40 cm MITRAL VALVE               TRICUSPID VALVE MV Area (PHT): 3.08 cm?    TR Peak grad:   17.8 mmHg MV Decel Time: 246 msec    TR Vmax:        211.00 cm/s MV E velocity: 93.30 cm/s MV A velocity: 80.60 cm/s  SHUNTS MV E/A ratio:  1.16        Systemic VTI:  0.18 m                            Systemic Diam: 2.30 cm Dani Gobble Croitoru MD Electronically signed by Sanda Klein MD Signature Date/Time: 08/16/2021/4:50:34 PM    Final   ? ?VAS US CAROTID ? ?Result Date: 08/16/2021 ?Carotid Arterial Duplex Study Patient Name:  Edward Hopkins  Date of Exam:   08/16/2021 Medical Rec #: [id number]      Accession #:    [id number] Date of Birth: 1951-01-20      Patient Gender: M Patient Age:   45 years Exam Location:  [facility] Procedure:      VAS US CAROTID Referring Phys: Courtney Heys  --------------------------------------------------------------------------------  Indications:       CVA. Risk Factors:      Diabetes, current smoker. Comparison Study:  No prior studies. Performing Technologist: Darlin Coco RDMS, RVT  Examination Guidelines: A complete evaluat

## 2021-08-17 NOTE — Progress Notes (Signed)
Physical Therapy Session Note ?(Make up)  ? ?Patient Details  ?Name: Edward Hopkins ?MRN: [id number] ?Date of Birth: [birth year] ? ?Today's Date: 08/17/2021 ?PT Individual Time: 1500 (make up)-1535 ?PT Individual Time Calculation (min): 35 min  ? ?Short Term Goals: ?Week 1:  PT Short Term Goal 1 (Week 1): Pt will complete least restrictive transfer with min A consistently ?PT Short Term Goal 2 (Week 1): Pt will ambulate x 25 ft with LRAD and min A ?PT Short Term Goal 3 (Week 1): Pt will initiate stair training as safe and able ? ?Skilled Therapeutic Interventions/Progress Updates: Pt presented in bed sleeping but easily aroused and agreeable to sit at EOB. Vitals checked prior to mobility as noted below. Pt c/o pain in abdomen and discussed with PTA how he felt something was wrong prior to PE. Pt appeared to be somewhat tangential in speaking at times but then would correct himself. With increased time pt performed supine to sit with use of bed features and modA for truncal support. At EOB pt required maxA for sitting balance due to posterior lean and pt noted to place RUE behind self. With corrections for placement of extremities pt was able to sit at EOB with minA allowing PTA to change gown. Pt also was provided with wet washcloth and was able to clean face. After approx 5 min pt requesting to lay back down. No increased dyspnea or c/o increased pain noted. Pt returned to supine with modA and required max mulimodal cues for scooting to [facility]. In semi-recumbent position pt was able to perform oral hygiene with set up. Once completed pulmonary MD arrived to speak with pt. Pt left in bed at end of session with bed alarm on, call bell within reach and needs met.  ?   ? ?Therapy Documentation ?Precautions:  ?Precautions ?Precautions: Fall ?Precaution Comments: mild R hemipareisis, 2L O2, slow to initiate ?Restrictions ?Weight Bearing Restrictions: No ?RLE Weight Bearing: Non weight bearing ?General: ?PT Amount of Missed Time  (min): 60 Minutes ?PT Missed Treatment Reason: Patient fatigue;Patient unwilling to participate ?Vital Signs: ?Therapy Vitals ?Temp: 99.8 ?F (37.7 ?C) ?Temp Source: Oral ?Pulse Rate: 73 ?Resp: 17 ?BP: 132/73 ?Patient Position (if appropriate): Lying ?Oxygen Therapy ?SpO2: 99 % ?O2 Device: Nasal Cannula ?O2 Flow Rate (L/min): 2 L/min ?Pain: ?Pain Assessment ?Pain Scale: 0-10 ?Pain Score: 6  ?Pain Type: Acute pain ?Pain Location: Chest ?Pain Orientation: Right ?Pain Descriptors / Indicators: Aching ?Pain Frequency: Constant ?Pain Onset: On-going ?Patients Stated Pain Goal: 2 ?Pain Intervention(s): Medication (See eMAR) ?Mobility: ?  ?Locomotion : ?   ?Trunk/Postural Assessment : ?   ?Balance: ?  ?Exercises: ?  ?Other Treatments:   ? ? ? ?Therapy/Group: Individual Therapy ? ?Miniya Miguez ?08/17/2021, 3:48 PM  ?

## 2021-08-17 NOTE — Progress Notes (Signed)
[facility] ? ?Subjective  -seen in rehab.  States he has had intermittent chest pain since admission. ? ? ?Objective ?(!) 113/59 ?72 ?98.6 ?F (37 ?C) ?18 ?96% ? ?Intake/Output Summary (Last 24 hours) at 08/17/2021 1122 ?Last data filed at 08/16/2021 2035 ?Gross per 24 hour  ?Intake 240 ml  ?Output 250 ml  ?Net -10 ml  ? ? ?Abdomen is soft ?Right groin is clean dry and intact ?Bilateral DP pulses palpable ? ?Laboratory ?Lab Results: ?Recent Labs  ?  08/15/21 ?5053 08/16/21 ?0512  ?WBC 5.1 5.0  ?HGB 8.5* 8.3*  ?HCT 27.5* 26.0*  ?PLT 219 215  ? ?BMET ?Recent Labs  ?  08/15/21 ?0704  ?NA 137  ?K 4.3  ?CL 97*  ?CO2 33*  ?GLUCOSE 93  ?BUN 31*  ?CREATININE 2.37*  ?CALCIUM 8.6*  ? ? ?COAG ?Lab Results  ?Component Value Date  ? INR 1.3 (H) 08/16/2021  ? INR 1.7 (H) 08/01/2021  ? INR 1.6 (H) 08/01/2021  ? ?No results found for: PTT ? ?Assessment/Planning: ? ?71 year old male that underwent stent graft repair of a ruptured type B descending thoracic dissection on 08/01/2021.  I was called yesterday after VQ scan was obtained in rehab with concern for acute PE in the right lung.  Discussed that I think anticoagulation in him is high risk given he does have fenestrations in the section between the SMA and renals that we could not stent and this caused communication with the false lumen.  On final aortogram we did not see any filling into the rupture sac in the chest.  Have been unable to rescan him due to his renal insufficiency and he has ultimately been stable on the floor.  Leg duplex showed no evidence of DVTs.  He has a IVC filter that has been placed years ago on a CT scan.  Vascular will continue to follow. ? ?Marty Heck ?08/17/2021 ?11:22 AM ?-- ? ? ?

## 2021-08-17 NOTE — Consult Note (Signed)
? ?NAME:  Edward Hopkins, MRN:  [id number], DOB:  1950/12/04, LOS: 6 ?ADMISSION DATE:  08/11/2021, CONSULTATION DATE:  08/17/21 ?REFERRING MD:  [place], CHIEF COMPLAINT:  Pulmonary Embolism  ? ?History of Present Illness:  ?Edward Hopkins is a 71 y.o.  M with PMH of recent ruptured type B aortic aneurysm secondary to hypertensive emergency requiring emergent repair and with subsequent respiratory failure, intubation and shock, , AKI, HFpEF, HTN, DVT, polysubstance abuse who was discharged from the hospital to rehab on 3/30.  Since Rehab admission, he was seen by Neurology for R sided weakness and found to have multifocal embolic appearing subcortical strokes in the L cerebellum and L frontal lobe, likely from dissection flap.  He also c/o back and chest pain and a V/Q scan was obtained that showed concern for acute PE in the RML.  PCCM consulted in this setting.  Vitals have been stable ? ?Pertinent  Medical History  ? has a past medical history of CHF (congestive heart failure) ([place]), Constipation, DVT (deep venous thrombosis) ([place]) (05/15/1993), Endocarditis, Hypertension, Intramural aortic hematoma ([place]), Osteomyelitis ([place]), Presence of IVC filter, Right femoral vein DVT ([place]) (07/14/2014), and Septic arthritis ([place]). Ruptured type B aortic aneurysm  ? ? ?Significant Hospital Events: ?Including procedures, antibiotic start and stop dates in addition to other pertinent events   ?3/30 admit to rehab ?4/4 V/Q scan concerning for PE in the RML ? ?Interim History / Subjective:  ? ?No acute distress ? ? ?Objective   ?Blood pressure 113/71, pulse 77, temperature 99.1 ?F (37.3 ?C), temperature source Oral, resp. rate 14, height 5\' 8"  (1.727 m), weight 84.2 kg, SpO2 98 %. ?   ?   ? ?Intake/Output Summary (Last 24 hours) at 08/17/2021 1146 ?Last data filed at 08/17/2021 1127 ?Gross per 24 hour  ?Intake 240 ml  ?Output 450 ml  ?Net -210 ml  ? ?Filed Weights  ? 08/13/21 0500 08/14/21 0309 08/17/21 0500  ?Weight: 87.4 kg 86.8 kg 84.2  kg  ? ? ?General: 71 year old male was somewhat lethargic ?HEENT: MM pink/moist no JVD lymphadenopathy is appreciated ?Neuro: AA affect but arouses follows command ?CV: Heart sounds are regular ?PULM: Lungs diminished breath sounds ? ? ?GI: soft, bsx4 active  ?GU: Voids amber urinary ?Extremities: warm/dry, 1+ edema  ?Skin: no rashes or lesions ? ? ?Resolved Hospital Problem list   ? ? ?Assessment & Plan:  ? ?Chest pain and acute hypoxic respiratory failure with suspected acute RML PE in the setting of recent ruptured abdominal aortic aneurysm  ? ?V/Q notable for defect ?Not currently in distress ?No lower extremity DVT is noted ?Lactic acid is 0.7 ?No anticoagulation due to concerns for bleeding ?Agree with palliative care consult ?Pulmonary critical care available as needed we will sign off at this time ? ? ?Best Practice (right click and "Reselect all SmartList Selections" daily)  ? ?Per primary ? ?Labs   ?CBC: ?Recent Labs  ?Lab 08/12/21 ?0631 08/15/21 ?0704 08/16/21 ?0512  ?WBC 5.1 5.1 5.0  ?NEUTROABS 3.0  --   --   ?HGB 8.7* 8.5* 8.3*  ?HCT 26.7* 27.5* 26.0*  ?MCV 87.0 89.0 87.8  ?PLT 194 219 215  ? ? ?Basic Metabolic Panel: ?Recent Labs  ?Lab 08/11/21 ?0133 08/12/21 ?0631 08/15/21 ?1324  ?NA 139 136 137  ?K 4.1 3.9 4.3  ?CL 98 97* 97*  ?CO2 31 33* 33*  ?GLUCOSE 97 105* 93  ?BUN 39* 33* 31*  ?CREATININE 2.38* 2.35* 2.37*  ?CALCIUM 8.3* 8.2* 8.6*  ? ?  GFR: ?Estimated Creatinine Clearance: 29.3 mL/min (A) (by C-G formula based on SCr of 2.37 mg/dL (H)). ?Recent Labs  ?Lab 08/12/21 ?0631 08/15/21 ?0704 08/16/21 ?2111 08/16/21 ?1811  ?WBC 5.1 5.1 5.0  --   ?LATICACIDVEN  --   --   --  0.7  ? ? ?Liver Function Tests: ?Recent Labs  ?Lab 08/12/21 ?0631  ?AST 23  ?ALT 13  ?ALKPHOS 40  ?BILITOT 1.8*  ?PROT 6.7  ?ALBUMIN 2.0*  ? ?No results for input(s): LIPASE, AMYLASE in the last 168 hours. ?No results for input(s): AMMONIA in the last 168 hours. ? ?ABG ?   ?Component Value Date/Time  ? PHART 7.409 08/02/2021 0701  ?  PCO2ART 31.8 (L) 08/02/2021 0701  ? PO2ART 76 (L) 08/02/2021 0701  ? HCO3 20.1 08/02/2021 0701  ? TCO2 21 (L) 08/02/2021 0701  ? ACIDBASEDEF 4.0 (H) 08/02/2021 0701  ? O2SAT 95 08/02/2021 0701  ?  ? ?Coagulation Profile: ?Recent Labs  ?Lab 08/16/21 ?1811  ?INR 1.3*  ? ? ?Cardiac Enzymes: ?No results for input(s): CKTOTAL, CKMB, CKMBINDEX, TROPONINI in the last 168 hours. ? ?HbA1C: ?Hgb A1c MFr Bld  ?Date/Time Value Ref Range Status  ?08/16/2021 05:12 AM 5.4 4.8 - 5.6 % Final  ?  Comment:  ?  (NOTE) ?        Prediabetes: 5.7 - 6.4 ?        Diabetes: >6.4 ?        Glycemic control for adults with diabetes: <7.0 ?  ?08/01/2021 07:49 PM 5.5 4.8 - 5.6 % Final  ?  Comment:  ?  (NOTE) ?Pre diabetes:          5.7%-6.4% ? ?Diabetes:              >6.4% ? ?Glycemic control for   <7.0% ?adults with diabetes ?  ? ? ?CBG: ?Recent Labs  ?Lab 08/16/21 ?7356 08/16/21 ?1200 08/16/21 ?1708 08/16/21 ?2106 08/17/21 ?7014  ?GLUCAP 102* 100* 98 109* 106*  ? ? ?Critical care time:  na ?  ? ? ?Richardson Landry Zorawar Strollo ACNP ?Acute Care Nurse Practitioner ?[place] ?Please consult Amion ?08/17/2021, 11:46 AM ? ? ? ? ?

## 2021-08-18 LAB — GLUCOSE, CAPILLARY
Glucose-Capillary: 92 mg/dL (ref 70–99)
Glucose-Capillary: 101 mg/dL — ABNORMAL HIGH (ref 70–99)
Glucose-Capillary: 81 mg/dL (ref 70–99)
Glucose-Capillary: 99 mg/dL (ref 70–99)

## 2021-08-18 NOTE — Progress Notes (Addendum)
? ?                                                                                                                                                     ?                                                   ?Daily Progress Note  ? ?Patient Name: Edward Hopkins       Date: 08/18/2021 ?DOB: 10-01-1950  Age: 71 y.o. MRN#: [id number] ?Attending Physician: Courtney Heys, MD ?Primary Care Physician: Pcp, No ?Admit Date: 08/11/2021 ? ?Reason for Consultation/Follow-up: Establishing goals of care ? ?Patient Profile/HPI:  71 y.o. male  with past medical history of CHF, DVT, HTN, IVC filter, and polysubstance abuse admitted to [facility] 08/11/2021 following hospital admission for  ruptured type B aortic aneurysm secondary to hypertensive emergency requiring emergent repair and with subsequent respiratory failure, intubation and shock. Since Rehab admission, he was seen by Neurology for R sided weakness and found to have multifocal embolic appearing subcortical strokes in the L cerebellum and L frontal lobe, likely from dissection flap.  He also c/o back and chest pain and a V/Q scan was obtained that showed concern for acute PE in the RML. No anticoagulation has been recommended as would be very high risk. PMT consulted to discuss [place].  ? ?Subjective: ?[name] was awake, but sleepy. ?He says  his family is not visiting him until tomorrow.  ?He did not recall his discussion with PMT provider yesterday. He did not wish to continued discussion regarding goals of care with me today.  ?Chart reviewed including progress notes, imagaing, labs.  ?He does have HCPOA and Advanced Directives in [place] indicating- Marrian Salvage as his HCPOA and his wishes for a natural death in the event he had a terminal illness. His Adv Directives also indicate that although he wishes for a natural death, he does wish for artificial hydration and nutrition.  ?I called and spoke with [name]- his HCPOA- she notes that patient's family is arriving from [place].  She would like to include them in [place] discussion.  ? ?Review of Systems  ?Unable to perform ROS: Mental status change  ? ? ?Physical Exam ?Vitals and nursing note reviewed.  ?Constitutional:   ?   Appearance: He is ill-appearing.  ?Pulmonary:  ?   Effort: Pulmonary effort is normal.  ?Neurological:  ?   Comments: Lethargic, appears confused  ?         ? ?Vital Signs: BP 124/65 (BP Location: Left Arm)   Pulse 75   Temp 97.8 ?F (36.6 ?C)   Resp 17   Ht 5\' 8"  (1.727 m)  Wt 84.3 kg   SpO2 100%   BMI 28.26 kg/m?  ?SpO2: SpO2: 100 % ?O2 Device: O2 Device: Room Air ?O2 Flow Rate: O2 Flow Rate (L/min): 2 L/min ? ?Intake/output summary:  ?Intake/Output Summary (Last 24 hours) at 08/18/2021 1224 ?Last data filed at 08/17/2021 1850 ?Gross per 24 hour  ?Intake 60 ml  ?Output 200 ml  ?Net -140 ml  ? ?LBM: Last BM Date : 08/15/21 ?Baseline Weight: Weight: 91.4 kg ?Most recent weight: Weight: 84.3 kg ? ?     ?Palliative Assessment/Data: PPS:  40% ? ? ? ?Flowsheet Rows   ? ?Flowsheet Row Most Recent Value  ?Intake Tab   ?Referral Department --  [CIR]  ?Unit at Time of Referral --  [CIR]  ?Palliative Care Primary Diagnosis --  [AAA]  ?Date Notified 08/16/21  ?Palliative Care Type New Palliative care  ?Reason for referral Clarify Goals of Care  ?Date of Admission 08/11/21  ?Date first seen by Palliative Care 08/17/21  ?# of days Palliative referral response time 1 Day(s)  ?# of days IP prior to Palliative referral 5  ?Clinical Assessment   ?Psychosocial & Spiritual Assessment   ?Palliative Care Outcomes   ? ?  ? ? ?Patient Active Problem List  ? Diagnosis Date Noted  ? Goals of care, counseling/discussion   ? Palliative care by specialist   ? Cerebral embolism with cerebral infarction 08/16/2021  ? Aortic aneurysm rupture ([place]) 08/11/2021  ? Hypertensive emergency 08/11/2021  ? Acute respiratory failure with hypoxia ([place]) 08/11/2021  ? Ileus ([place]) 08/11/2021  ? Hemorrhagic shock ([place]) 08/11/2021  ? Thrombocytopenia ([place])  08/11/2021  ? Chronic kidney disease (CKD), stage IV (severe) ([place]) 08/11/2021  ? Debility 08/11/2021  ? Cocaine abuse ([place])   ? Shock ([place])   ? Dissection of aorta ([place]) 07/27/2021  ? Hypercoagulable state ([place]) 10/21/2015  ? Pulmonary embolism ([place]) 10/21/2015  ? S/P ascending aortic replacement 08/27/2014  ? Acute kidney injury ([place]) 08/06/2014  ? Malnutrition of moderate degree ([place]) 08/05/2014  ? Orthostatic hypotension 08/04/2014  ? Hypotension 08/04/2014  ? Femoral DVT (deep venous thrombosis) ([facility])   ? Intramural aortic hematoma ([place]) 07/17/2014  ? ? ?Palliative Care Assessment & Plan  ? ? ?Assessment/Recommendations/Plan ? ?Continue current care, no de-escalation ?Recommend conferring with patient's HCPOA regarding all medical decisions ?Plan to meet tomorrow at 4 for full family [place] meeting ? ? ?Code Status: ?Full code ? ?Prognosis: ? Unable to determine ? ?Discharge Planning: ?To Be Determined ? ?Care plan was discussed with patient's spouse. ?Total time; 60 mins ? ?Thank you for allowing the Palliative Medicine Team to assist in the care of this patient. ? ?Mariana Kaufman, AGNP-C ?Palliative Medicine ? ? ?Please contact Palliative Medicine Team phone at [phone] for questions and concerns.  ? ? ? ? ? ? ?

## 2021-08-18 NOTE — Progress Notes (Signed)
Occupational Therapy Weekly Progress Note ? ?Patient Details  ?Name: Edward Hopkins ?MRN: [id number] ?Date of Birth: 1950-10-05 ? ?Beginning of progress report period: August 12, 2021 ?End of progress report period: August 18, 2021 ? ?Patient has met 0 of 4 short term goals.  Pt has made minimal progress since admission. Pt has been unable to consistently participate in therapy secondary to ongoing medical conditions. Pt;s plan of care modified to 15/7 as pt unable to tolerate current therapy schedule with OT and PT. Pt required mod A/max A for bed mobility and functional tranfsers. Pt has consistently declined participating in bathing/dressing tasks. ? ?Patient continues to demonstrate the following deficits: muscle weakness, decreased cardiorespiratoy endurance, impaired timing and sequencing, unbalanced muscle activation, decreased coordination, and decreased motor planning,  , acute pain, and decreased sitting balance, decreased standing balance, decreased balance strategies, and difficulty maintaining precautions and therefore will continue to benefit from skilled OT intervention to enhance overall performance with BADL and Reduce care partner burden. ? ?Patient progressing toward long term goals..  Continue plan of care. ? ?OT Short Term Goals ?Week 1:  OT Short Term Goal 1 (Week 1): Pt will be able to sit to EOB with min A within 2 minutes demonstrating improved initiation and motor skills. ?OT Short Term Goal 1 - Progress (Week 1): Progressing toward goal ?OT Short Term Goal 2 (Week 1): sitting EOB, pt will don tshirt with min A. ?OT Short Term Goal 2 - Progress (Week 1): Progressing toward goal ?OT Short Term Goal 3 (Week 1): Pt will be able to stand with min A to RW within 2 minutes to prep for LB dressing. ?OT Short Term Goal 3 - Progress (Week 1): Progressing toward goal ?OT Short Term Goal 4 (Week 1): Pt will be able to transfer to [facility] with mod A. ?OT Short Term Goal 4 - Progress (Week 1): Progressing toward  goal ?Week 2:  OT Short Term Goal 1 (Week 2): Pt will be able to sit to EOB with min A within 2 minutes demonstrating improved initiation and motor skills. ?OT Short Term Goal 2 (Week 2): sitting EOB, pt will don tshirt with min A. ?OT Short Term Goal 3 (Week 2): Pt will be able to stand with min A to RW within 2 minutes to prep for LB dressing. ?OT Short Term Goal 4 (Week 2): Pt will be able to transfer to [facility] with mod A. ?Week 3:    ? ?Skilled Therapeutic Interventions/Progress Updates:  ? ?   ? ?Therapy Documentation ?Precautions:  ?Precautions ?Precautions: Fall ?Precaution Comments: mild R hemipareisis, 2L O2, slow to initiate ?Restrictions ?Weight Bearing Restrictions: No ?RLE Weight Bearing: Non weight bearing ? ? ? ?Therapy/Group: Individual Therapy ? ?[name] ?08/18/2021, 6:54 AM  ?

## 2021-08-18 NOTE — Progress Notes (Signed)
Physical Therapy Session Note ? ?Patient Details  ?Name: Edward Hopkins ?MRN: [id number] ?Date of Birth: Jun 05, 1950 ? ?Today's Date: 08/18/2021 ?PT Individual Time: 2202-5427 ?PT Individual Time Calculation (min): 40 min  ? ?Short Term Goals: ?Week 1:  PT Short Term Goal 1 (Week 1): Pt will complete least restrictive transfer with min A consistently ?PT Short Term Goal 2 (Week 1): Pt will ambulate x 25 ft with LRAD and min A ?PT Short Term Goal 3 (Week 1): Pt will initiate stair training as safe and able ? ?Skilled Therapeutic Interventions/Progress Updates: Pt presented in bed agreeable to therapy. Pt c/o pain at chest (?due to PE), repositioning and rest breaks provided as needed. Per previous discussion with interdisciplinary staff and discussion with pt PTA obtained reclined back w/c. VItals checked prior to mobilization of pt BP 140/82 (100), HR 87, SpO2 95% (RA). Pt performed supine with sit with modA (primarily for truncal support) and use of bed features. Pt noted to have improved initial sitting balance this session with moderately decreased posterior lean noted. Pt performed stand pivot transfer to w/c with modA. Pt's chair was adjusted with pt indicating felt more comfortable than in recliner. Pt did indicate increased fatigue however was able to perform scoot back in w/c with supervision. Pt left in w/c at end of session with belt alarm on, call bell within reach and nsg notified of pt's disposition.  ?   ? ?Therapy Documentation ?Precautions:  ?Precautions ?Precautions: Fall ?Precaution Comments: mild R hemipareisis, 2L O2, slow to initiate ?Restrictions ?Weight Bearing Restrictions: No ?RLE Weight Bearing: Non weight bearing ?General: ?PT Amount of Missed Time (min): 35 Minutes ?PT Missed Treatment Reason: Patient fatigue ?Vital Signs: ?Therapy Vitals ?Temp: 98.9 ?F (37.2 ?C) ?Temp Source: Oral ?Pulse Rate: 75 ?Resp: 18 ?BP: 134/66 ?Patient Position (if appropriate): Lying ?Oxygen Therapy ?SpO2: 95 % ?O2  Device: Room Air ?Pain: ?Pain Assessment ?Pain Score: Asleep ?Mobility: ?  ?Locomotion : ?   ?Trunk/Postural Assessment : ?   ?Balance: ?  ?Exercises: ?  ?Other Treatments:   ? ? ? ?Therapy/Group: Individual Therapy ? ?Disha Cottam ?08/18/2021, 3:55 PM  ?

## 2021-08-18 NOTE — Progress Notes (Signed)
?                                                       PROGRESS NOTE ? ? ?Subjective/Complaints: ? ?Pt reports feeling SOB- mild- ?Asking when gets to therapy- explained has therapy at 9:30-  ? ?Says needs to speak to GF_ but we tried calling and line was busy.  ?Said family coming from [place] today- Says that back of regular w/c very painful for back, but angled back much more comfortable.  ?LBM "days ago"- feeling constipated.  ? ?LBM in flowsheet 4/3- but I saw him having BM 4/4-  ? ? ?ROS- ? ?Pt denies SOB, abd pain, CP, N/V/(+) C/D, and vision changes ? ?Objective: ?  ?NM Pulmonary Perfusion ? ?Result Date: 08/16/2021 ?CLINICAL DATA:  Chest pain. Short of breath. Concern for pulmonary embolism. EXAM: NUCLEAR MEDICINE PERFUSION LUNG SCAN TECHNIQUE: Perfusion images were obtained in multiple projections after intravenous injection of radiopharmaceutical. RADIOPHARMACEUTICALS:  5.1 mCi Tc-33m MAA COMPARISON:  Chest radiograph 08/15/2021 FINDINGS: There is a wedge-shaped peripheral perfusion defect within the middle segment of the RIGHT middle lobe. No corresponding abnormality on comparison chest radiograph. Regional decreased perfusion in the LEFT lower lobe is favored related to effusion and cardiomegaly on radiograph. IMPRESSION: Concern for acute pulmonary embolism to the RIGHT middle lobe. Electronically Signed   By: Suzy Bouchard M.D.   On: 08/16/2021 12:31  ? ?US BREAST LTD UNI LEFT INC AXILLA ? ?Result Date: 08/17/2021 ?CLINICAL DATA:  LEFT breast pain. EXAM: ULTRASOUND OF THE LEFT BREAST COMPARISON:  None. FINDINGS: Targeted ultrasound is performed, showing normal appearing fibroglandular tissue in the 2 o'clock, 3 o'clock, and 4 o'clock locations of the LEFT breast. No suspicious mass, distortion, or acoustic shadowing is demonstrated with ultrasound. IMPRESSION: There is no sonographic evidence for malignancy. RECOMMENDATION: Recommend correlation with physical exam. If there is a palpable mass,  recommend mammogram and ultrasound at a dedicated breast imaging facility. I have discussed the findings and recommendations with the patient. If applicable, a reminder letter will be sent to the patient regarding the next appointment. BI-RADS CATEGORY  1: Negative. Electronically Signed   By: Nolon Nations M.D.   On: 08/17/2021 13:14  ? ?ECHOCARDIOGRAM COMPLETE ? ?Result Date: 08/16/2021 ?   ECHOCARDIOGRAM REPORT   Patient Name:   Edward Hopkins Date of Exam: 08/16/2021 Medical Rec #:  [id number]     Height:       68.0 in Accession #:    [id number]    Weight:       191.4 lb Date of Birth:  [birth year]     BSA:          2.006 m? Patient Age:    71 years      BP:           121/63 mmHg Patient Gender: M             HR:           68 bpm. Exam Location:  Inpatient Procedure: 2D Echo STAT ECHO Indications:    pulmonary embolus  History:        Patient has prior history of Echocardiogram examinations, most                 recent 08/02/2021. Ascending aortic root replacement, chronic  kidney disease; Risk Factors:Hypertension.  Sonographer:    Johny Chess RDCS Referring Phys: [id number] [place]  1. Left ventricular ejection fraction, by estimation, is 60 to 65%. The left ventricle has normal function. The left ventricle has no regional wall motion abnormalities. There is mild concentric left ventricular hypertrophy. Left ventricular diastolic parameters are consistent with Grade II diastolic dysfunction (pseudonormalization). Elevated left atrial pressure.  2. Right ventricular systolic function is normal. The right ventricular size is normal. There is normal pulmonary artery systolic pressure.  3. Left atrial size was mildly dilated.  4. The mitral valve is normal in structure. Mild mitral valve regurgitation.  5. The aortic valve is tricuspid. There is mild calcification of the aortic valve. There is mild thickening of the aortic valve. Aortic valve regurgitation is mild. Aortic valve  sclerosis is present, with no evidence of aortic valve stenosis.  6. Ascending aorta appears to be s/p graft repair, with normal caliber. Aortic dilatation noted. There is borderline dilatation of the aortic root, measuring 39 mm.  7. The inferior vena cava is normal in size with greater than 50% respiratory variability, suggesting right atrial pressure of 3 mmHg. Comparison(s): No significant change from prior study. Prior images reviewed side by side. FINDINGS  Left Ventricle: Left ventricular ejection fraction, by estimation, is 60 to 65%. The left ventricle has normal function. The left ventricle has no regional wall motion abnormalities. The left ventricular internal cavity size was normal in size. There is  mild concentric left ventricular hypertrophy. Left ventricular diastolic parameters are consistent with Grade II diastolic dysfunction (pseudonormalization). Elevated left atrial pressure. Right Ventricle: The right ventricular size is normal. No increase in right ventricular wall thickness. Right ventricular systolic function is normal. There is normal pulmonary artery systolic pressure. The tricuspid regurgitant velocity is 2.11 m/s, and  with an assumed right atrial pressure of 3 mmHg, the estimated right ventricular systolic pressure is 71.0 mmHg. Left Atrium: Left atrial size was mildly dilated. Right Atrium: Right atrial size was normal in size. Pericardium: There is no evidence of pericardial effusion. Mitral Valve: The mitral valve is normal in structure. Mild mitral valve regurgitation, with centrally-directed jet. Tricuspid Valve: The tricuspid valve is normal in structure. Tricuspid valve regurgitation is trivial. Aortic Valve: The aortic valve is tricuspid. There is mild calcification of the aortic valve. There is mild thickening of the aortic valve. Aortic valve regurgitation is mild. Aortic regurgitation PHT measures 460 msec. Aortic valve sclerosis is present,  with no evidence of aortic  valve stenosis. Pulmonic Valve: The pulmonic valve was normal in structure. Pulmonic valve regurgitation is not visualized. Aorta: Ascending aorta appears to be s/p graft repair, with normal caliber. Aortic dilatation noted. There is borderline dilatation of the aortic root, measuring 39 mm. Venous: The inferior vena cava is normal in size with greater than 50% respiratory variability, suggesting right atrial pressure of 3 mmHg. IAS/Shunts: No atrial level shunt detected by color flow Doppler.  LEFT VENTRICLE PLAX 2D LVIDd:         4.70 cm      Diastology LVIDs:         3.10 cm      LV e' medial:    6.85 cm/s LV PW:         1.10 cm      LV E/e' medial:  13.6 LV IVS:        1.20 cm      LV e' lateral:   7.40 cm/s  LVOT diam:     2.30 cm      LV E/e' lateral: 12.6 LV SV:         73 LV SV Index:   36 LVOT Area:     4.15 cm?  LV Volumes (MOD) LV vol d, MOD A2C: 128.0 ml LV vol s, MOD A2C: 52.0 ml LV SV MOD A2C:     76.0 ml RIGHT VENTRICLE             IVC RV S prime:     10.20 cm/s  IVC diam: 1.50 cm TAPSE (M-mode): 1.8 cm LEFT ATRIUM             Index        RIGHT ATRIUM           Index LA diam:        3.40 cm 1.69 cm/m?   RA Area:     16.70 cm? LA Vol (A2C):   53.5 ml 26.67 ml/m?  RA Volume:   45.80 ml  22.83 ml/m? LA Vol (A4C):   61.0 ml 30.41 ml/m? LA Biplane Vol: 60.5 ml 30.16 ml/m?  AORTIC VALVE LVOT Vmax:   101.00 cm/s LVOT Vmean:  61.900 cm/s LVOT VTI:    0.176 m AI PHT:      460 msec  AORTA Ao Root diam: 3.90 cm Ao Asc diam:  3.40 cm MITRAL VALVE               TRICUSPID VALVE MV Area (PHT): 3.08 cm?    TR Peak grad:   17.8 mmHg MV Decel Time: 246 msec    TR Vmax:        211.00 cm/s MV E velocity: 93.30 cm/s MV A velocity: 80.60 cm/s  SHUNTS MV E/A ratio:  1.16        Systemic VTI:  0.18 m                            Systemic Diam: 2.30 cm Dani Gobble Croitoru MD Electronically signed by Sanda Klein MD Signature Date/Time: 08/16/2021/4:50:34 PM    Final   ? ?VAS US CAROTID ? ?Result Date: 08/16/2021 ?Carotid Arterial  Duplex Study Patient Name:  Edward Hopkins  Date of Exam:   08/16/2021 Medical Rec #: [id number]      Accession #:    [id number] Date of Birth: 1950/05/28      Patient Gender: M Patient Age:   82 years Exam Location:

## 2021-08-18 NOTE — Progress Notes (Signed)
Occupational Therapy Session Note ? ?Patient Details  ?Name: Edward Hopkins ?MRN: [id number] ?Date of Birth: Aug 03, 1950 ? ?Today's Date: 08/18/2021 ?OT Individual Time: 3545-6256 ?OT Individual Time Calculation (min): 58 min  ? ? ?Short Term Goals: ?Week 2:  OT Short Term Goal 1 (Week 2): Pt will be able to sit to EOB with min A within 2 minutes demonstrating improved initiation and motor skills. ?OT Short Term Goal 2 (Week 2): sitting EOB, pt will don tshirt with min A. ?OT Short Term Goal 3 (Week 2): Pt will be able to stand with min A to RW within 2 minutes to prep for LB dressing. ?OT Short Term Goal 4 (Week 2): Pt will be able to transfer to [facility] with mod A. ? ?Skilled Therapeutic Interventions/Progress Updates:  ?  Pt resting in bed with eyes closed upon arrival. Pt agreeable to therapy but declined sitting EOB. Pt awaiting phone call from family. Max A for repositioning in bed and rolling R/L. Pt engaged in BUE therex with 1# and 2# bar-chest presses 3x15 and 3x20. Pt required rest breaks between each set. Pt remained in bed with all needs within reach and bed alarm activated.  ? ?Therapy Documentation ?Precautions:  ?Precautions ?Precautions: Fall ?Precaution Comments: mild R hemipareisis, 2L O2, slow to initiate ?Restrictions ?Weight Bearing Restrictions: No ?RLE Weight Bearing: Non weight bearing ?  ?Pain: ?Pt reports abdominal pain; emotional support and repositioned ? ? ?Therapy/Group: Individual Therapy ? ?Edward Hopkins ?08/18/2021, 10:31 AM ?

## 2021-08-19 LAB — GLUCOSE, CAPILLARY
Glucose-Capillary: 85 mg/dL (ref 70–99)
Glucose-Capillary: 88 mg/dL (ref 70–99)
Glucose-Capillary: 95 mg/dL (ref 70–99)
Glucose-Capillary: 97 mg/dL (ref 70–99)

## 2021-08-19 LAB — CBC WITH DIFFERENTIAL/PLATELET
Abs Immature Granulocytes: 0.03 10*3/uL (ref 0.00–0.07)
Basophils Absolute: 0 10*3/uL (ref 0.0–0.1)
Basophils Relative: 0 %
Eosinophils Absolute: 0.3 10*3/uL (ref 0.0–0.5)
Eosinophils Relative: 6 %
HCT: 29.6 % — ABNORMAL LOW (ref 39.0–52.0)
Hemoglobin: 9.3 g/dL — ABNORMAL LOW (ref 13.0–17.0)
Immature Granulocytes: 1 %
Lymphocytes Relative: 42 %
Lymphs Abs: 2.5 10*3/uL (ref 0.7–4.0)
MCH: 27.3 pg (ref 26.0–34.0)
MCHC: 31.4 g/dL (ref 30.0–36.0)
MCV: 86.8 fL (ref 80.0–100.0)
Monocytes Absolute: 0.7 10*3/uL (ref 0.1–1.0)
Monocytes Relative: 12 %
Neutro Abs: 2.3 10*3/uL (ref 1.7–7.7)
Neutrophils Relative %: 39 %
Platelets: 261 10*3/uL (ref 150–400)
RBC: 3.41 MIL/uL — ABNORMAL LOW (ref 4.22–5.81)
RDW: 14.6 % (ref 11.5–15.5)
WBC: 5.9 10*3/uL (ref 4.0–10.5)
nRBC: 0 % (ref 0.0–0.2)

## 2021-08-19 LAB — BASIC METABOLIC PANEL
Anion gap: 8 (ref 5–15)
BUN: 28 mg/dL — ABNORMAL HIGH (ref 8–23)
CO2: 29 mmol/L (ref 22–32)
Calcium: 8.8 mg/dL — ABNORMAL LOW (ref 8.9–10.3)
Chloride: 95 mmol/L — ABNORMAL LOW (ref 98–111)
Creatinine, Ser: 2.46 mg/dL — ABNORMAL HIGH (ref 0.61–1.24)
GFR, Estimated: 27 mL/min — ABNORMAL LOW (ref >60)
Glucose, Bld: 84 mg/dL (ref 70–99)
Potassium: 4.3 mmol/L (ref 3.5–5.1)
Sodium: 132 mmol/L — ABNORMAL LOW (ref 135–145)

## 2021-08-19 MED ORDER — SORBITOL 70 % SOLN
30.0000 mL | Freq: Once | Status: AC
  Administered 2021-08-19: 30 mL via ORAL
  Filled 2021-08-19: qty 30

## 2021-08-19 NOTE — Progress Notes (Signed)
Palliative-  ? ?Family cancelled [place] meeting planned for today. Out of town family is not coming until Sunday. Will attempt another discussion when they have arrived.  ? ?Mariana Kaufman, AGNP-C ?Palliative Medicine ? ?No charge ?

## 2021-08-19 NOTE — Progress Notes (Signed)
Physical Therapy Session Note ? ?Patient Details  ?Name: Edward Hopkins ?MRN: [id number] ?Date of Birth: [birth year] ? ?Today's Date: 08/19/2021 ? ?   ? ?Short Term Goals: ?Week 1:  PT Short Term Goal 1 (Week 1): Pt will complete least restrictive transfer with min A consistently ?PT Short Term Goal 2 (Week 1): Pt will ambulate x 25 ft with LRAD and min A ?PT Short Term Goal 3 (Week 1): Pt will initiate stair training as safe and able ? ?Skilled Therapeutic Interventions/Progress Updates: Pt missed 60 min skilled PT due to fatigue and pt refusal. Pt indicated increased pain today at chest and pt also depressed that family did not travel from up [place] as anticipated. PTA provided emotional support and active listening. PTA ensured all current need met.  ?   ? ?Therapy Documentation ?Precautions:  ?Precautions ?Precautions: Fall ?Precaution Comments: mild R hemipareisis, 2L O2, slow to initiate ?Restrictions ?Weight Bearing Restrictions: No ?RLE Weight Bearing: Non weight bearing ?General: ?PT Amount of Missed Time (min): 60 Minutes ?PT Missed Treatment Reason: Patient fatigue ? ? ?Therapy/Group: Individual Therapy ? ?Johm Pfannenstiel ?08/19/2021, 12:58 PM  ?

## 2021-08-19 NOTE — Progress Notes (Signed)
?                                                       PROGRESS NOTE ? ? ?Subjective/Complaints: ? ?Said family not coming today- upset about that- asking about code status- "doesn't want to suffer".  ? ? ? ?ROS- ? ?Pt denies SOB, abd pain, CP, N/V/(+) C/D, and vision changes ? ?Objective: ?  ?No results found. ?Recent Labs  ?  08/19/21 ?9470  ?WBC 5.9  ?HGB 9.3*  ?HCT 29.6*  ?PLT 261  ? ?Recent Labs  ?  08/19/21 ?9628  ?NA 132*  ?K 4.3  ?CL 95*  ?CO2 29  ?GLUCOSE 84  ?BUN 28*  ?CREATININE 2.46*  ?CALCIUM 8.8*  ? ? ? ?Intake/Output Summary (Last 24 hours) at 08/19/2021 1030 ?Last data filed at 08/18/2021 1300 ?Gross per 24 hour  ?Intake 80 ml  ?Output --  ?Net 80 ml  ?  ? ?  ? ?Physical Exam: ?Vital Signs ?Blood pressure 127/63, pulse 82, temperature 98.9 ?F (37.2 ?C), resp. rate 16, height 5\' 8"  (1.727 m), weight 84.3 kg, SpO2 99 %. ? ? ? ? ? ? ? ?General: awake, alert, appropriate, supine in bed; speaking more quietly today; NAD ?HENT: conjugate gaze; oropharynx moist ?CV: regular rate; no JVD ?Pulmonary: decreased at bases; but CTA B/L- no W/R/R ?GI: soft, NT, ND, (+)BS ?Psychiatric: appropriate ?Neurological: alert- kept repeating "don't want to suffer" ? ? Skin: RUE less swelling trace to 1+- hand - esp hand better, but possible tissue necrosis- looks more erythematous- not raised- looks the same ?LUE 5/5 ?RUE 4+/5 delt bi, tri grip ?LLE- 5/5 ?RLE- 5-/5- weaker on R side  ?Ext  ?2+ LE edema to mid calf and 1-2+ RUE swelling  ?Neurological:  ?   Mental Status: He is oriented to person, place, and time.  ?   Comments: Left facial weakness. Lethargic and tended to drift off. Internally distracted and needed cues to follow simple motor commands. He was able to use calender independently to recall date. Able to recall age and DOB.   ?Sleepy, but Ox3- knew at [facility] cone and why; and month/year ?Sensation intact in all 4 extremities  ?Assessment/Plan: ?1. Functional deficits which require 3+ hours per day of  interdisciplinary therapy in a comprehensive inpatient rehab setting. ?Physiatrist is providing close team supervision and 24 hour management of active medical problems listed below. ?Physiatrist and rehab team continue to assess barriers to discharge/monitor patient progress toward functional and medical goals ? ?Care Tool: ? ?Bathing ?   ?Body parts bathed by patient: Chest, Abdomen, Front perineal area, Left arm, Right arm, Face  ? Body parts bathed by helper: Buttocks, Right upper leg, Left upper leg, Left lower leg, Right lower leg ?  ?  ?Bathing assist Assist Level: Moderate Assistance - Patient 50 - 74% ?  ?  ?Upper Body Dressing/Undressing ?Upper body dressing   ?What is the patient wearing?: Pull over shirt ?   ?Upper body assist Assist Level: Maximal Assistance - Patient 25 - 49% ?   ?Lower Body Dressing/Undressing ?Lower body dressing ? ? ?   ?What is the patient wearing?: Incontinence brief ? ?  ? ?Lower body assist Assist for lower body dressing: Minimal Assistance - Patient > 75% ?   ? ?Toileting ?Toileting    ?Toileting  assist Assist for toileting: 2 Helpers ?  ?  ?Transfers ?Chair/bed transfer ? ?Transfers assist ?   ? ?Chair/bed transfer assist level: Minimal Assistance - Patient > 75% ?  ?  ?Locomotion ?Ambulation ? ? ?Ambulation assist ? ? Ambulation activity did not occur: Safety/medical concerns ? ?  ?  ?   ? ?Walk 10 feet activity ? ? ?Assist ? Walk 10 feet activity did not occur: Safety/medical concerns ? ?  ?   ? ?Walk 50 feet activity ? ? ?Assist Walk 50 feet with 2 turns activity did not occur: Safety/medical concerns ? ?  ?   ? ? ?Walk 150 feet activity ? ? ?Assist Walk 150 feet activity did not occur: Safety/medical concerns ? ?  ?  ?  ? ?Walk 10 feet on uneven surface  ?activity ? ? ?Assist Walk 10 feet on uneven surfaces activity did not occur: Safety/medical concerns ? ? ?  ?   ? ?Wheelchair ? ? ? ? ?Assist Is the patient using a wheelchair?: Yes ?Type of Wheelchair: Manual ?  ? ?  ?    ? ? ?Wheelchair 50 feet with 2 turns activity ? ? ? ?Assist ? ?  ?Wheelchair 50 feet with 2 turns activity did not occur: Safety/medical concerns ? ? ?   ? ?Wheelchair 150 feet activity  ? ? ? ?Assist ? Wheelchair 150 feet activity did not occur: Safety/medical concerns ? ? ?   ? ?Blood pressure 127/63, pulse 82, temperature 98.9 ?F (37.2 ?C), resp. rate 16, height 5\' 8"  (1.727 m), weight 84.3 kg, SpO2 99 %. ? ?Medical Problem List and Plan: ?1. Functional deficits secondary to debility from  thoracic dissection of aneurysm  ?            -patient may  shower ?            -ELOS/Goals: 12-14 days min A to supervision ? D/c date 4/19 ? Con't CIR- PT and OT-  ?2.  Antithrombotics: ?-DVT/anticoagulation:  Pharmaceutical: Lovenox. Will order dopplers of RUE.  ?            -antiplatelet therapy: N/A ?3. Pain Management:  oxycodone prn  ? 4/4- having a lot of lower chest and back pain- con't pain meds- will add lidocaine patches for night time for back pain.  ? 4/7- pain doing better- con't regimen ?4. Mood: LCSW to follow for evaluation and support.  ?            -antipsychotic agents: N/A ?5. Neuropsych: This patient may be  capable of making decisions on his own behalf. ?6. Skin/Wound Care: Routine pressure relief measures.  Monitor areas on right forearm/wrist for demarcation ?            -will add Vitamin C and Zinc to promote healing.  ?7. Fluids/Electrolytes/Nutrition: Monitor I/O. Check lytes in am.  ?8. Ruptured  descending thoracic aneurysm s/p repair: BP management.  ?            --will need repeat CTA 4 weeks (monitoring SCr  for improvement) ?9. Acute on chronic renal failure: with new CKD Stage IV Improvement in BUN/SCr- 39/2.38 ?            --monitor with serial checks. Avoid nephrotoxic medications.  ? 3/31- Cr and BUN stable con't to monitor weekly.  ? 4/3- Cr still 2.3-2.4- is stable- per Vascular wants pt kidney function to improve some, before CTA< however need to check for PE/so will do both ? 4/6-  labs in AM ? 4/7- Cr 2.46- very slightly increased- will con't regimen ?10. Acute blood loss anemia: Recheck CBC in am.  ? 4/3- Hb very slightly less at 8.5- will monitor ?11. Hypoxia: Wean oxygen as tolerated. Monitor for signs of overload ?            --Off diuresis. Resp rate increase with minimal movement- will monitor O2 sats.  ? 3/31- monitor closely, since  SOB with minimal exertion. ?4/3- still requiring O2- will check for PE this evening- since pt's still has DOE.  ?4/4- Couldn't do CTA- so ordered VQ scabn- pending.   ?4/5- has PE- but no DVT- has filter from 1995- d/w pulmonary- risk of AC is worse than no AC- will hold off- due to high risk of bleeding profusely per vascular-  ?Asking palliative care to help with goals.  ? 4/6- pt wants to be full code right now palliative care ot see again today- family coming today.  ?4/7- O2 sats lower this AM- on RA- needed to be put back on O2 ?12. ABLA: Improving with Epogen and thrombocytopenia has resolved.   ? 3/31- Hb 8.7- stable to slightly down- was 9.1 a few days ago- ? 4/3- Hb 8.5- overall stable ?13. Polysubstance abuse: Educated on importance of cessation ?            --smokes > 1 PPD and marijuana/cocaine-->Has planned to quit.  ?14. Obesity: BMI 30.- initially documented as BMI of 68- was wrong- Educate on diet and importance of weight loss to help promote mobility and overall health.  ?15. Ileus: Has been refusing daily suppository--last BM on 03/25.  ?--Intake poor--0-50% in the past 24 hours with abdominal distension. ?--Will increase Miralax to bid.Check KUB for follow up.  ?3/31- KUB looks better, but still distended loops of bowel- not eating much- will start Reglan 5 mg TID for the moment and see if things improve-KUB improving today , add dulc supp, mylicon ?4/6- LBM 2+ days ago- will give miralax since had too aggressive response ot sorbitol ?4/7- will ry Sorbitol 30cc since no BM yet ?16. R hemiparesis with R facial droop- head CT already  done- will assess if can get brain MRI ? 3/31- will ask vascular since had stent- and se eif can get MRI ? 4/3- appears has new stroke in L frontal and cerebellum 3 spots- c/w acute infarct/embolism from aneurysm,  ?17

## 2021-08-19 NOTE — Progress Notes (Signed)
Occupational Therapy Session Note ? ?Patient Details  ?Name: Edward Hopkins ?MRN: [id number] ?Date of Birth: 03/21/1951 ? ?Today's Date: 08/19/2021 ?OT Individual Time: 0800-0900 ?OT Individual Time Calculation (min): 60 min  ? ? ?Short Term Goals: ?Week 2:  OT Short Term Goal 1 (Week 2): Pt will be able to sit to EOB with min A within 2 minutes demonstrating improved initiation and motor skills. ?OT Short Term Goal 2 (Week 2): sitting EOB, pt will don tshirt with min A. ?OT Short Term Goal 3 (Week 2): Pt will be able to stand with min A to RW within 2 minutes to prep for LB dressing. ?OT Short Term Goal 4 (Week 2): Pt will be able to transfer to [facility] with mod A. ? ?Skilled Therapeutic Interventions/Progress Updates:  ?  Pt resting in bed upon arrival. MD present. Pt's fO2 canula laying to the side. Pt's O2 sats 75%. 4L O2 initially brought O2 sats to 100%. O2 reduced to 2L and O2 maintained at >95% throughout remainder of session. Pt educated on importance of keeping nasal canual in. Pt washed face with wash cloth and oral gel applied to pt's lips. Transitional movements in bed resulted in increased discomfort for pt. Pt declined donning hospital gown or paper scrubs. OT intervention with focus on bed mobility and employing therapeutic listening/emotional support. Pt concerned that his family will not come to visit him in hospital. Pt remained in bed with 2L O2. Bed alarm activated. All needs within reach.  ? ?Therapy Documentation ?Precautions:  ?Precautions ?Precautions: Fall ?Precaution Comments: mild R hemipareisis, 2L O2, slow to initiate ?Restrictions ?Weight Bearing Restrictions: No ?RLE Weight Bearing: Non weight bearing ?General: ?General ?OT Amount of Missed Time: 15 Minutes ?Pain: ? Pt c/o chest and abdominal pain; RN and MD aware, premedicated, repositioned ? ?Therapy/Group: Individual Therapy ? ?Edward Hopkins ?08/19/2021, 9:00 AM ?

## 2021-08-19 NOTE — Progress Notes (Signed)
?   08/19/21 1600  ?Clinical Encounter Type  ?Visited With Patient;Family;Health care provider  ?Visit Type Follow-up  ?Referral From Patient  ?Consult/Referral To Chaplain  ?Spiritual Encounters  ?Spiritual Needs Emotional  ? ?Chaplain attempted follow-up with patient who had been seen earlier. Patient was asleep; however, the PA provided an update on the patient and learned that family meeting is planned for later today to discuss palliative care. Chaplain was invited to return and meet with family as well.  Chaplain returned to unit and spoke with patient. As requested by patient, contacted [name] via phone (life partner of 22 years) and learned that patient does not have children. However, the brother, nieces, and nephew will visit the hospital on Sunday, 4/10.  ? ?Edward Hopkins, Resident Chaplain ?([phone] ?

## 2021-08-20 LAB — GLUCOSE, CAPILLARY
Glucose-Capillary: 95 mg/dL (ref 70–99)
Glucose-Capillary: 88 mg/dL (ref 70–99)

## 2021-08-20 NOTE — Progress Notes (Signed)
?                                                       PROGRESS NOTE ? ? ?Subjective/Complaints: ?No new complaints today ?Has chest pain that has been present for 2 weeks- feels like a pressure- reviewed vascular note from 4/5 which discusses possible PE ? ? ?ROS- ? ?Pt denies SOB, abd pain, N/V/(+) C/D, and vision changes, +chest pain ? ?Objective: ?  ?No results found. ?Recent Labs  ?  08/19/21 ?1540  ?WBC 5.9  ?HGB 9.3*  ?HCT 29.6*  ?PLT 261  ? ?Recent Labs  ?  08/19/21 ?0867  ?NA 132*  ?K 4.3  ?CL 95*  ?CO2 29  ?GLUCOSE 84  ?BUN 28*  ?CREATININE 2.46*  ?CALCIUM 8.8*  ? ? ? ?Intake/Output Summary (Last 24 hours) at 08/20/2021 1239 ?Last data filed at 08/20/2021 6195 ?Gross per 24 hour  ?Intake 0 ml  ?Output --  ?Net 0 ml  ?  ? ?  ? ?Physical Exam: ?Vital Signs ?Blood pressure 122/67, pulse 79, temperature 98.3 ?F (36.8 ?C), temperature source Oral, resp. rate 18, height 5\' 8"  (1.727 m), weight 84.3 kg, SpO2 100 %. ?Gen: no distress, normal appearing ?HEENT: oral mucosa pink and moist, NCAT ?Cardio: Reg rate ?Chest: normal effort, normal rate of breathing ?Abd: soft, non-distended ?Ext: no edema ?Psych: pleasant, normal affect ?Neurological: alert- kept repeating "don't want to suffer" ? ? Skin: RUE less swelling trace to 1+- hand - esp hand better, but possible tissue necrosis- looks more erythematous- not raised- looks the same ?LUE 5/5 ?RUE 4+/5 delt bi, tri grip ?LLE- 5/5 ?RLE- 5-/5- weaker on R side  ?Ext  ?2+ LE edema to mid calf and 1-2+ RUE swelling  ?Neurological:  ?   Mental Status: He is oriented to person, place, and time.  ?   Comments: Left facial weakness. Lethargic and tended to drift off. Internally distracted and needed cues to follow simple motor commands. He was able to use calender independently to recall date. Able to recall age and DOB.   ?Sleepy, but Ox3- knew at [facility] cone and why; and month/year ?Sensation intact in all 4 extremities  ?Assessment/Plan: ?1. Functional deficits which require  3+ hours per day of interdisciplinary therapy in a comprehensive inpatient rehab setting. ?Physiatrist is providing close team supervision and 24 hour management of active medical problems listed below. ?Physiatrist and rehab team continue to assess barriers to discharge/monitor patient progress toward functional and medical goals ? ?Care Tool: ? ?Bathing ?   ?Body parts bathed by patient: Chest, Abdomen, Front perineal area, Left arm, Right arm, Face  ? Body parts bathed by helper: Buttocks, Right upper leg, Left upper leg, Left lower leg, Right lower leg ?  ?  ?Bathing assist Assist Level: Moderate Assistance - Patient 50 - 74% ?  ?  ?Upper Body Dressing/Undressing ?Upper body dressing   ?What is the patient wearing?: Pull over shirt ?   ?Upper body assist Assist Level: Maximal Assistance - Patient 25 - 49% ?   ?Lower Body Dressing/Undressing ?Lower body dressing ? ? ?   ?What is the patient wearing?: Incontinence brief ? ?  ? ?Lower body assist Assist for lower body dressing: Minimal Assistance - Patient > 75% ?   ? ?Toileting ?Toileting    ?Toileting assist  Assist for toileting: 2 Helpers ?  ?  ?Transfers ?Chair/bed transfer ? ?Transfers assist ?   ? ?Chair/bed transfer assist level: Minimal Assistance - Patient > 75% ?  ?  ?Locomotion ?Ambulation ? ? ?Ambulation assist ? ? Ambulation activity did not occur: Safety/medical concerns ? ?  ?  ?   ? ?Walk 10 feet activity ? ? ?Assist ? Walk 10 feet activity did not occur: Safety/medical concerns ? ?  ?   ? ?Walk 50 feet activity ? ? ?Assist Walk 50 feet with 2 turns activity did not occur: Safety/medical concerns ? ?  ?   ? ? ?Walk 150 feet activity ? ? ?Assist Walk 150 feet activity did not occur: Safety/medical concerns ? ?  ?  ?  ? ?Walk 10 feet on uneven surface  ?activity ? ? ?Assist Walk 10 feet on uneven surfaces activity did not occur: Safety/medical concerns ? ? ?  ?   ? ?Wheelchair ? ? ? ? ?Assist Is the patient using a wheelchair?: Yes ?Type of  Wheelchair: Manual ?  ? ?  ?   ? ? ?Wheelchair 50 feet with 2 turns activity ? ? ? ?Assist ? ?  ?Wheelchair 50 feet with 2 turns activity did not occur: Safety/medical concerns ? ? ?   ? ?Wheelchair 150 feet activity  ? ? ? ?Assist ? Wheelchair 150 feet activity did not occur: Safety/medical concerns ? ? ?   ? ?Blood pressure 122/67, pulse 79, temperature 98.3 ?F (36.8 ?C), temperature source Oral, resp. rate 18, height 5\' 8"  (1.727 m), weight 84.3 kg, SpO2 100 %. ? ?Medical Problem List and Plan: ?1. Functional deficits secondary to debility from  thoracic dissection of aneurysm  ?            -patient may  shower ?            -ELOS/Goals: 12-14 days min A to supervision ? D/c date 4/19 ? Continue CIR ?2.  Antithrombotics: ?-DVT/anticoagulation:  Pharmaceutical: Lovenox. Will order dopplers of RUE.  ?            -antiplatelet therapy: N/A ?3. Pain Management:  oxycodone prn  ? 4/4- having a lot of lower chest and back pain- con't pain meds- will add lidocaine patches for night time for back pain.  ? 4/7- pain doing better- con't regimen ?4. Mood: LCSW to follow for evaluation and support.  ?            -antipsychotic agents: N/A ?5. Neuropsych: This patient may be  capable of making decisions on his own behalf. ?6. Skin/Wound Care: Routine pressure relief measures.  Monitor areas on right forearm/wrist for demarcation ?            -will add Vitamin C and Zinc to promote healing.  ?7. Fluids/Electrolytes/Nutrition: Monitor I/O. Check lytes in am.  ?8. Ruptured  descending thoracic aneurysm s/p repair: BP management.  ?            --will need repeat CTA 4 weeks (monitoring SCr  for improvement) ?9. Acute on chronic renal failure: with new CKD Stage IV Improvement in BUN/SCr- 39/2.38 ?            --monitor with serial checks. Avoid nephrotoxic medications.  ? 3/31- Cr and BUN stable con't to monitor weekly.  ? 4/3- Cr still 2.3-2.4- is stable- per Vascular wants pt kidney function to improve some, before CTA< however  need to check for PE/so will do both ? 4/6- labs  in AM ? 4/7- Cr 2.46- very slightly increased- will con't regimen ?10. Acute blood loss anemia: Recheck CBC in am.  ? 4/3- Hb very slightly less at 8.5- will monitor ?11. Hypoxia: Wean oxygen as tolerated. Monitor for signs of overload ?            --Off diuresis. Resp rate increase with minimal movement- will monitor O2 sats.  ? 3/31- monitor closely, since  SOB with minimal exertion. ?4/3- still requiring O2- will check for PE this evening- since pt's still has DOE.  ?4/4- Couldn't do CTA- so ordered VQ scabn- pending.   ?4/5- has PE- but no DVT- has filter from 1995- d/w pulmonary- risk of AC is worse than no AC- will hold off- due to high risk of bleeding profusely per vascular-  ?Asking palliative care to help with goals.  ? 4/6- pt wants to be full code right now palliative care ot see again today- family coming today.  ?4/7- O2 sats lower this AM- on RA- needed to be put back on O2 ?12. ABLA: Improving with Epogen and thrombocytopenia has resolved.   ? 3/31- Hb 8.7- stable to slightly down- was 9.1 a few days ago- ? 4/3- Hb 8.5- overall stable ?13. Polysubstance abuse: Educated on importance of cessation ?            --smokes > 1 PPD and marijuana/cocaine-->Has planned to quit.  ?14. Obesity: BMI 30.- initially documented as BMI of 68- was wrong- Educate on diet and importance of weight loss to help promote mobility and overall health.  ?15. Ileus: Has been refusing daily suppository--last BM on 03/25.  ?--Intake poor--0-50% in the past 24 hours with abdominal distension. ?--Will increase Miralax to bid.Check KUB for follow up.  ?3/31- KUB looks better, but still distended loops of bowel- not eating much- will start Reglan 5 mg TID for the moment and see if things improve-KUB improving today , add dulc supp, mylicon ?4/6- LBM 2+ days ago- will give miralax since had too aggressive response ot sorbitol ?4/7- will ry Sorbitol 30cc since no BM yet ?16. R  hemiparesis with R facial droop- head CT already done- will assess if can get brain MRI ? 3/31- will ask vascular since had stent- and se eif can get MRI ? 4/3- appears has new stroke in L frontal and cerebellum 3 spots

## 2021-08-20 NOTE — Progress Notes (Signed)
Physical Therapy Session Note ? ?Patient Details  ?Name: Edward Hopkins ?MRN: [id number] ?Date of Birth: [birth year] ? ?Today's Date: 08/20/2021 ?PT Individual Time: 5364-6803 ?PT Individual Time Calculation (min): 14 min  ? ?Short Term Goals: ?Week 1:  PT Short Term Goal 1 (Week 1): Pt will complete least restrictive transfer with min A consistently ?PT Short Term Goal 2 (Week 1): Pt will ambulate x 25 ft with LRAD and min A ?PT Short Term Goal 3 (Week 1): Pt will initiate stair training as safe and able ?Week 2:    ? ?Skilled Therapeutic Interventions/Progress Updates:  ?Pt supine in bed and asleep on entrance to room. Pt not easily roused and remains lethargic throughout time in room. Pt relates shortness of breath and fatigue and need to rest.  ? ?Pt educated on importance of participation with therapies in order to improve fatigue, improve cardiorespiratory symptoms and systems, as well as overall wellness. Pt refuses any further therapy.  ? ?Pt missed 46 min of skilled therapy due to shortness of breath/ fatigue. Will re-attempt as schedule and pt availability permits. ? ?   ? ?Therapy Documentation ?Precautions:  ?Precautions ?Precautions: Fall ?Precaution Comments: mild R hemipareisis, 2L O2, slow to initiate ?Restrictions ?Weight Bearing Restrictions: No ?RLE Weight Bearing: Non weight bearing ?General: ?PT Amount of Missed Time (min): 46 Minutes ?PT Missed Treatment Reason: Patient fatigue;Other (Comment) (SOB) ?Vital Signs: ?  ?Pain: ? No complaint of pain, but does c/o shortness of breath.  ? ?Therapy/Group: Individual Therapy ? ?Alger Simons PT, DPT ?08/20/2021, 12:47 PM  ?

## 2021-08-20 NOTE — Progress Notes (Signed)
Patient ID: Edward Hopkins, male   DOB: 02-Feb-1951, 71 y.o.   MRN: [id number] ? ?Per conversation with Dr. Ranell Patrick, ACHS blood sugar checks to be discontinued. Blood sugars WDL. ? ?Dorthula Nettles, RN3, BSN, CBIS, CRRN, WTA ?Care Coordinator, Inpatient Rehabilitation ?Office [phone]  ?

## 2021-08-20 NOTE — Progress Notes (Signed)
Occupational Therapy Session Note ? ?Patient Details  ?Name: Edward Hopkins ?MRN: [id number] ?Date of Birth: [birth year] ? ?Today's Date: 08/20/2021 ?OT Individual Time: 1100-1120 ?OT Individual Time Calculation (min): 20 min  ? ? ?Short Term Goals: ?Week 1:  OT Short Term Goal 1 (Week 1): Pt will be able to sit to EOB with min A within 2 minutes demonstrating improved initiation and motor skills. ?OT Short Term Goal 1 - Progress (Week 1): Progressing toward goal ?OT Short Term Goal 2 (Week 1): sitting EOB, pt will don tshirt with min A. ?OT Short Term Goal 2 - Progress (Week 1): Progressing toward goal ?OT Short Term Goal 3 (Week 1): Pt will be able to stand with min A to RW within 2 minutes to prep for LB dressing. ?OT Short Term Goal 3 - Progress (Week 1): Progressing toward goal ?OT Short Term Goal 4 (Week 1): Pt will be able to transfer to [facility] with mod A. ?OT Short Term Goal 4 - Progress (Week 1): Progressing toward goal ? ?Skilled Therapeutic Interventions/Progress Updates:Patient notes indicated he is on bed rest.  He concurred to completed strengthening bed level to maintain or increase ability to complete self care and bed mobility.   After working for just a few minutes he complained of fatigue and stated he needed to rest.   Physician came in to do assessment during session. Patient reported "pressure on the chest most all the time" for recent days.   ? ?Will be important to assess what patient is allowed to do mobility, strength and endurance wise if he is indeed on bed rest in order to direct therapy to maintain strength and mobility without patient placing self in contraindicated positions, exercise, and out of bed or edge of bed participation. ? ?Continue to consult with nursing and physicians for correct level of activity. ? ?Patient was left in bed with call bell engaged and phone within reach. ?   ? ?Therapy Documentation ?Precautions:  ?Precautions ?Precautions: Fall ?Precaution Comments: mild R  hemipareisis, 2L O2, slow to initiate ?Restrictions ?Weight Bearing Restrictions: No ?RLE Weight Bearing: Non weight bearing ? ?  ?Pain:denied ? ? ?Therapy/Group: Individual Therapy ? ?Edward Hopkins ?08/20/2021, 1:59 PM ?

## 2021-08-20 NOTE — Progress Notes (Signed)
? ?                                                                                                                                                     ?                                                   ?Daily Progress Note  ? ?Patient Name: Edward Hopkins       Date: 08/20/2021 ?DOB: Sep 28, 1950  Age: 71 y.o. MRN#: [id number] ?Attending Physician: Courtney Heys, MD ?Primary Care Physician: Pcp, No ?Admit Date: 08/11/2021 ? ?Reason for Consultation/Follow-up: Establishing goals of care ? ?Patient Profile/HPI:   71 y.o. male  with past medical history of CHF, DVT, HTN, IVC filter, and polysubstance abuse admitted to [facility] 08/11/2021 following hospital admission for  ruptured type B aortic aneurysm secondary to hypertensive emergency requiring emergent repair and with subsequent respiratory failure, intubation and shock. Since Rehab admission, he was seen by Neurology for R sided weakness and found to have multifocal embolic appearing subcortical strokes in the L cerebellum and L frontal lobe, likely from dissection flap.  He also c/o back and chest pain and a V/Q scan was obtained that showed concern for acute PE in the RML. No anticoagulation has been recommended as would be very high risk. PMT consulted to discuss [place].  ? ?Subjective: ?[name] is awake and alert. He is less confused than when I saw him a few days ago.  ?He is depressed because his family has not come to see him yet- they are planning on coming tomorrow.  ?He shares that he wants to continue working with PT and life prolonging measures. He tells me that he put in his work with PT today.  ?He is not sure about his code status- he wants to discuss this with his family when they arrive. He shares he is tired of doctors asking him about it because it also makes him sad.  ? ?Review of Systems  ?Constitutional:  Positive for malaise/fatigue.  ?Respiratory:  Negative for shortness of breath.   ? ? ?Physical Exam ?Vitals and nursing note reviewed.  ?Constitutional:    ?   Comments: frail  ?Cardiovascular:  ?   Rate and Rhythm: Normal rate.  ?Pulmonary:  ?   Effort: Pulmonary effort is normal.  ?Neurological:  ?   Mental Status: He is oriented to person, place, and time.  ?         ? ?Vital Signs: BP 122/67 (BP Location: Left Arm)   Pulse 79   Temp 98.3 ?F (36.8 ?C) (Oral)   Resp 18   Ht 5\' 8"  (1.727 m)   Wt 84.3  kg   SpO2 100%   BMI 28.26 kg/m?  ?SpO2: SpO2: 100 % ?O2 Device: O2 Device: Nasal Cannula ?O2 Flow Rate: O2 Flow Rate (L/min): 2 L/min ? ?Intake/output summary:  ?Intake/Output Summary (Last 24 hours) at 08/20/2021 1347 ?Last data filed at 08/20/2021 1300 ?Gross per 24 hour  ?Intake 240 ml  ?Output --  ?Net 240 ml  ? ?LBM: Last BM Date : 08/19/21 ?Baseline Weight: Weight: 91.4 kg ?Most recent weight: Weight: 84.3 kg ? ?     ?Palliative Assessment/Data: PPS: 40% ? ? ? ?Flowsheet Rows   ? ?Flowsheet Row Most Recent Value  ?Intake Tab   ?Referral Department --  [CIR]  ?Unit at Time of Referral --  [CIR]  ?Palliative Care Primary Diagnosis --  [AAA]  ?Date Notified 08/16/21  ?Palliative Care Type New Palliative care  ?Reason for referral Clarify Goals of Care  ?Date of Admission 08/11/21  ?Date first seen by Palliative Care 08/17/21  ?# of days Palliative referral response time 1 Day(s)  ?# of days IP prior to Palliative referral 5  ?Clinical Assessment   ?Psychosocial & Spiritual Assessment   ?Palliative Care Outcomes   ? ?  ? ? ?Patient Active Problem List  ? Diagnosis Date Noted  ? Goals of care, counseling/discussion   ? Palliative care by specialist   ? Cerebral embolism with cerebral infarction 08/16/2021  ? Aortic aneurysm rupture ([place]) 08/11/2021  ? Hypertensive emergency 08/11/2021  ? Acute respiratory failure with hypoxia ([place]) 08/11/2021  ? Ileus ([place]) 08/11/2021  ? Hemorrhagic shock ([place]) 08/11/2021  ? Thrombocytopenia ([place]) 08/11/2021  ? Chronic kidney disease (CKD), stage IV (severe) ([place]) 08/11/2021  ? Debility 08/11/2021  ? Cocaine abuse ([place])   ? Shock  ([place])   ? Dissection of aorta ([place]) 07/27/2021  ? Hypercoagulable state ([place]) 10/21/2015  ? Pulmonary embolism ([place]) 10/21/2015  ? S/P ascending aortic replacement 08/27/2014  ? Acute kidney injury ([place]) 08/06/2014  ? Malnutrition of moderate degree ([place]) 08/05/2014  ? Orthostatic hypotension 08/04/2014  ? Hypotension 08/04/2014  ? Femoral DVT (deep venous thrombosis) ([facility])   ? Intramural aortic hematoma ([place]) 07/17/2014  ? ? ?Palliative Care Assessment & Plan  ? ? ?Assessment/Recommendations/Plan ? ?Continue current plan of care ?PMT will follow up on Tuesday with patient, will chart check and see sooner if he decompensates and goals need to be addressed ? ? ?Code Status: ?Full code ? ?Prognosis: ? Unable to determine ? ?Discharge Planning: ?To Be Determined ? ?Care plan was discussed with patient.  ? ?Thank you for allowing the Palliative Medicine Team to assist in the care of this patient. ? ?Total time:  75 mins ? ?Greater than 50%  of this time was spent counseling and coordinating care related to the above assessment and plan. ? ?Mariana Kaufman, AGNP-C ?Palliative Medicine ? ? ?Please contact Palliative Medicine Team phone at [phone] for questions and concerns.  ? ? ? ? ? ? ?

## 2021-08-21 NOTE — Plan of Care (Signed)
?  Problem: RH Balance ?Goal: LTG Patient will maintain dynamic standing balance (PT) ?Description: LTG:  Patient will maintain dynamic standing balance with assistance during mobility activities (PT) ?Flowsheets (Taken 08/21/2021 1202) ?LTG: Pt will maintain dynamic standing balance during mobility activities with:: (downgrade due to slow progress) Minimal Assistance - Patient > 75% ?Note: downgrade due to slow progress ?  ?Problem: Sit to Stand ?Goal: LTG:  Patient will perform sit to stand with assistance level (PT) ?Description: LTG:  Patient will perform sit to stand with assistance level (PT) ?Flowsheets (Taken 08/21/2021 1202) ?LTG: PT will perform sit to stand in preparation for functional mobility with assistance level: (downgrade due to slow progress) Minimal Assistance - Patient > 75% ?Note: downgrade due to slow progress ?  ?Problem: RH Bed Mobility ?Goal: LTG Patient will perform bed mobility with assist (PT) ?Description: LTG: Patient will perform bed mobility with assistance, with/without cues (PT). ?Flowsheets (Taken 08/21/2021 1202) ?LTG: Pt will perform bed mobility with assistance level of: (downgrade due to slow progress) Minimal Assistance - Patient > 75% ?Note: downgrade due to slow progress ?  ?Problem: RH Ambulation ?Goal: LTG Patient will ambulate in controlled environment (PT) ?Description: LTG: Patient will ambulate in a controlled environment, # of feet with assistance (PT). ?Flowsheets (Taken 08/21/2021 1202) ?LTG: Ambulation distance in controlled environment: (downgrade due to slow progress) 50 ft with LRAD ?Note: downgrade due to slow progress ?Goal: LTG Patient will ambulate in home environment (PT) ?Description: LTG: Patient will ambulate in home environment, # of feet with assistance (PT). ?Flowsheets (Taken 08/21/2021 1202) ?LTG: Ambulation distance in home environment: (downgrade due to slow progress) 25 ft with LRAD ?Note: downgrade due to slow progress ?  ?Problem: RH Stairs ?Goal: LTG  Patient will ambulate up and down stairs w/assist (PT) ?Description: LTG: Patient will ambulate up and down # of stairs with assistance (PT) ?Flowsheets ?Taken 08/21/2021 1202 by Excell Seltzer, PT ?LTG: Pt will ambulate up/down stairs assist needed:: (downgrade due to slow progress) Moderate Assistance - Patient 50 - 74% ?Taken 08/13/2021 0327 by Alger Simons, PT ?LTG: Pt will  ambulate up and down number of stairs: at least 1 step as per home environment in order to enter/ exit home and mobilize in community ?Note: downgrade due to slow progress ?  ?

## 2021-08-21 NOTE — Progress Notes (Signed)
?                                                       PROGRESS NOTE ? ? ?Subjective/Complaints: ?No new complaints this morning ?Continues to have chest pressure ?Would like to take a nap after breakfast.  ?Would like to remain full code right now but at times he has expressed to RN he may prefer DNR.  ? ? ?ROS- ? ?Pt denies SOB, abd pain, N/V/(+) C/D, and vision changes, +chest pain ? ?Objective: ?  ?No results found. ?Recent Labs  ?  08/19/21 ?9233  ?WBC 5.9  ?HGB 9.3*  ?HCT 29.6*  ?PLT 261  ? ?Recent Labs  ?  08/19/21 ?0076  ?NA 132*  ?K 4.3  ?CL 95*  ?CO2 29  ?GLUCOSE 84  ?BUN 28*  ?CREATININE 2.46*  ?CALCIUM 8.8*  ? ? ? ?Intake/Output Summary (Last 24 hours) at 08/21/2021 0949 ?Last data filed at 08/21/2021 0830 ?Gross per 24 hour  ?Intake 300 ml  ?Output 700 ml  ?Net -400 ml  ?  ? ?  ? ?Physical Exam: ?Vital Signs ?Blood pressure 120/63, pulse 73, temperature 97.8 ?F (36.6 ?C), temperature source Oral, resp. rate 18, height 5\' 8"  (1.727 m), weight 84 kg, SpO2 98 %. ?Gen: no distress, normal appearing ?HEENT: oral mucosa pink and moist, NCAT ?Cardio: Reg rate ?Chest: normal effort, normal rate of breathing ?Abd: soft, non-distended ?Ext: no edema ?Psych: pleasant, normal affect ? ? Skin: RUE less swelling trace to 1+- hand - esp hand better, but possible tissue necrosis- looks more erythematous- not raised- looks the same ?LUE 5/5 ?RUE 4+/5 delt bi, tri grip ?LLE- 5/5 ?RLE- 5-/5- weaker on R side  ?Ext  ?2+ LE edema to mid calf and 1-2+ RUE swelling  ?Neurological:  ?   Mental Status: He is oriented to person, place, and time.  ?   Comments: Left facial weakness. Lethargic and tended to drift off. Internally distracted and needed cues to follow simple motor commands. He was able to use calender independently to recall date. Able to recall age and DOB.   ?Sleepy, but Ox3- knew at [facility] cone and why; and month/year ?Sensation intact in all 4 extremities  ?Assessment/Plan: ?1. Functional deficits which require 3+  hours per day of interdisciplinary therapy in a comprehensive inpatient rehab setting. ?Physiatrist is providing close team supervision and 24 hour management of active medical problems listed below. ?Physiatrist and rehab team continue to assess barriers to discharge/monitor patient progress toward functional and medical goals ? ?Care Tool: ? ?Bathing ?   ?Body parts bathed by patient: Chest, Abdomen, Front perineal area, Left arm, Right arm, Face  ? Body parts bathed by helper: Buttocks, Right upper leg, Left upper leg, Left lower leg, Right lower leg ?  ?  ?Bathing assist Assist Level: Moderate Assistance - Patient 50 - 74% ?  ?  ?Upper Body Dressing/Undressing ?Upper body dressing   ?What is the patient wearing?: Pull over shirt ?   ?Upper body assist Assist Level: Maximal Assistance - Patient 25 - 49% ?   ?Lower Body Dressing/Undressing ?Lower body dressing ? ? ?   ?What is the patient wearing?: Incontinence brief ? ?  ? ?Lower body assist Assist for lower body dressing: Minimal Assistance - Patient > 75% ?   ? ?Toileting ?  Toileting    ?Toileting assist Assist for toileting: 2 Helpers ?  ?  ?Transfers ?Chair/bed transfer ? ?Transfers assist ?   ? ?Chair/bed transfer assist level: Minimal Assistance - Patient > 75% ?  ?  ?Locomotion ?Ambulation ? ? ?Ambulation assist ? ? Ambulation activity did not occur: Safety/medical concerns ? ?  ?  ?   ? ?Walk 10 feet activity ? ? ?Assist ? Walk 10 feet activity did not occur: Safety/medical concerns ? ?  ?   ? ?Walk 50 feet activity ? ? ?Assist Walk 50 feet with 2 turns activity did not occur: Safety/medical concerns ? ?  ?   ? ? ?Walk 150 feet activity ? ? ?Assist Walk 150 feet activity did not occur: Safety/medical concerns ? ?  ?  ?  ? ?Walk 10 feet on uneven surface  ?activity ? ? ?Assist Walk 10 feet on uneven surfaces activity did not occur: Safety/medical concerns ? ? ?  ?   ? ?Wheelchair ? ? ? ? ?Assist Is the patient using a wheelchair?: Yes ?Type of Wheelchair:  Manual ?  ? ?  ?   ? ? ?Wheelchair 50 feet with 2 turns activity ? ? ? ?Assist ? ?  ?Wheelchair 50 feet with 2 turns activity did not occur: Safety/medical concerns ? ? ?   ? ?Wheelchair 150 feet activity  ? ? ? ?Assist ? Wheelchair 150 feet activity did not occur: Safety/medical concerns ? ? ?   ? ?Blood pressure 120/63, pulse 73, temperature 97.8 ?F (36.6 ?C), temperature source Oral, resp. rate 18, height 5\' 8"  (1.727 m), weight 84 kg, SpO2 98 %. ? ?Medical Problem List and Plan: ?1. Functional deficits secondary to debility from  thoracic dissection of aneurysm  ?            -patient may  shower ?            -ELOS/Goals: 12-14 days min A to supervision ? D/c date 4/19 ? Continue CIR ?2.  Antithrombotics: ?-DVT/anticoagulation:  Pharmaceutical: Lovenox. Will order dopplers of RUE.  ?            -antiplatelet therapy: N/A ?3. Pain Management:  oxycodone prn  ? 4/4- having a lot of lower chest and back pain- con't pain meds- will add lidocaine patches for night time for back pain.  ? 4/7- pain doing better- con't regimen ?4. Mood: LCSW to follow for evaluation and support.  ?            -antipsychotic agents: N/A ?5. Neuropsych: This patient may be  capable of making decisions on his own behalf. ?6. Skin/Wound Care: Routine pressure relief measures.  Monitor areas on right forearm/wrist for demarcation ?            -will add Vitamin C and Zinc to promote healing.  ?7. Fluids/Electrolytes/Nutrition: Monitor I/O. Check lytes in am.  ?8. Ruptured  descending thoracic aneurysm s/p repair: BP management.  ?            --will need repeat CTA 4 weeks (monitoring SCr  for improvement) ?9. Acute on chronic renal failure: with new CKD Stage IV Improvement in BUN/SCr- 39/2.38 ?            --monitor with serial checks. Avoid nephrotoxic medications.  ? 3/31- Cr and BUN stable con't to monitor weekly.  ? 4/3- Cr still 2.3-2.4- is stable- per Vascular wants pt kidney function to improve some, before CTA< however need to check  for PE/so  will do both ? 4/6- labs in AM ? 4/7- Cr 2.46- very slightly increased- will con't regimen ?10. Acute blood loss anemia: Recheck CBC in am.  ? 4/3- Hb very slightly less at 8.5- will monitor ?11. Hypoxia: Wean oxygen as tolerated. Monitor for signs of overload ?            --Off diuresis. Resp rate increase with minimal movement- will monitor O2 sats.  ? 3/31- monitor closely, since  SOB with minimal exertion. ?4/3- still requiring O2- will check for PE this evening- since pt's still has DOE.  ?4/4- Couldn't do CTA- so ordered VQ scabn- pending.   ?4/5- has PE- but no DVT- has filter from 1995- d/w pulmonary- risk of AC is worse than no AC- will hold off- due to high risk of bleeding profusely per vascular-  ?Asking palliative care to help with goals.  ? 4/6- pt wants to be full code right now palliative care ot see again today- family coming today.  ?4/7- O2 sats lower this AM- on RA- needed to be put back on O2 ?12. ABLA: Improving with Epogen and thrombocytopenia has resolved.   ? 3/31- Hb 8.7- stable to slightly down- was 9.1 a few days ago- ? 4/3- Hb 8.5- overall stable ?13. Polysubstance abuse: Educated on importance of cessation ?            --smokes > 1 PPD and marijuana/cocaine-->Has planned to quit.  ?14. Obesity: BMI 30.- initially documented as BMI of 68- was wrong- Educate on diet and importance of weight loss to help promote mobility and overall health.  ?15. Ileus: Has been refusing daily suppository--last BM on 03/25.  ?--Intake poor--0-50% in the past 24 hours with abdominal distension. ?--Will increase Miralax to bid.Check KUB for follow up.  ?3/31- KUB looks better, but still distended loops of bowel- not eating much- will start Reglan 5 mg TID for the moment and see if things improve-KUB improving today , add dulc supp, mylicon ?4/6- LBM 2+ days ago- will give miralax since had too aggressive response ot sorbitol ?4/7- will ry Sorbitol 30cc since no BM yet ?16. R hemiparesis with R  facial droop- head CT already done- will assess if can get brain MRI ? 3/31- will ask vascular since had stent- and se eif can get MRI ? 4/3- appears has new stroke in L frontal and cerebellum 3 spots- c/w ac

## 2021-08-21 NOTE — Progress Notes (Signed)
Occupational Therapy Session Note ? ?Patient Details  ?Name: Edward Hopkins ?MRN: [id number] ?Date of Birth: Aug 12, 1950 ? ?Today's Date: 08/21/2021 ?OT Individual Time: 1100-1155 ?OT Individual Time Calculation (min): 55 min  ? ? ?Short Term Goals: ?Week 2:  OT Short Term Goal 1 (Week 2): Pt will be able to sit to EOB with min A within 2 minutes demonstrating improved initiation and motor skills. ?OT Short Term Goal 2 (Week 2): sitting EOB, pt will don tshirt with min A. ?OT Short Term Goal 3 (Week 2): Pt will be able to stand with min A to RW within 2 minutes to prep for LB dressing. ?OT Short Term Goal 4 (Week 2): Pt will be able to transfer to [facility] with mod A. ? ?Skilled Therapeutic Interventions/Progress Updates:  ?  Pt sleeping upon arrival but easily aroused. OT intervention with focus on bed mobility, BUE strengthening, activity tolerance, and discharge planning. Pt unsure of assistance that will be available at discharge and concerned that he has not had any communication with family. Pt repositioned in bed with min A. UB bathing with HOB elevated. Pt states he had a "rough night" and was fatigued this morning. Pt completed sets of chest presses and biceps curls with 3# bar bell-3x15. Attempted to wean off O2-RA at 76% and 1L O2 at 84% sats. Pt returned to 2L O2 and pt remained >94% with bed level activity. Pt remained in bed with all needs within reach and bed alarm activated. All needs within reach.  ? ?Therapy Documentation ?Precautions:  ?Precautions ?Precautions: Fall ?Precaution Comments: mild R hemipareisis, 2L O2, slow to initiate ?Restrictions ?Weight Bearing Restrictions: No ?RLE Weight Bearing: Non weight bearing ? ?  ?Pain: ?Pt denies pain this morning ? ? ?Therapy/Group: Individual Therapy ? ?[name] ?08/21/2021, 11:54 AM ?

## 2021-08-21 NOTE — Progress Notes (Signed)
Physical Therapy Weekly Progress Note ? ?Patient Details  ?Name: Edward Hopkins ?MRN: [id number] ?Date of Birth: [birth year] ? ?Beginning of progress report period: August 12, 2021 ?End of progress report period: August 21, 2021 ? ?Patient has met 0 of 3 short term goals.  Pt has not made much progress towards therapy goals over the past week due to ongoing medical issues including constant chest pain, onset of PE, and time of being on medical/bedrest due to medical status. Pt remains very lethargic and uninterested in participation in therapy sessions as well as well as being impaired by low endurance and tolerance for therapy sessions. He is currently mod A overall for bed mobility, mod A for transfers with the RW, and has been unable to initiate gait training, stair training, or w/c mobility. ? ?Patient continues to demonstrate the following deficits muscle weakness and muscle joint tightness, decreased cardiorespiratoy endurance and decreased oxygen support, abnormal tone, unbalanced muscle activation, and decreased coordination, decreased initiation, decreased attention, decreased awareness, decreased problem solving, decreased safety awareness, decreased memory, and delayed processing, and decreased sitting balance, decreased standing balance, decreased postural control, hemiplegia, and decreased balance strategies and therefore will continue to benefit from skilled PT intervention to increase functional independence with mobility. ? ?Patient not progressing toward long term goals.  See goal revision..  Plan of care revisions: Downgraded goals to min A overall for bed mobility and transfers, decreased gait goal from 75 ft to 50 ft in controlled environment and from 50 fto 25 in home environment, downgraded stair goal to mod A due to decreased endurance and tolerance for therapy leading to slow progress towards goals. ? ?PT Short Term Goals ?Week 1:  PT Short Term Goal 1 (Week 1): Pt will complete least restrictive  transfer with min A consistently ?PT Short Term Goal 1 - Progress (Week 1): Not met ?PT Short Term Goal 2 (Week 1): Pt will ambulate x 25 ft with LRAD and min A ?PT Short Term Goal 2 - Progress (Week 1): Not met ?PT Short Term Goal 3 (Week 1): Pt will initiate stair training as safe and able ?PT Short Term Goal 3 - Progress (Week 1): Not met ?Week 2:  PT Short Term Goal 1 (Week 2): =LTG due to ELOS ? ?Therapy Documentation ?Precautions:  ?Precautions ?Precautions: Fall ?Precaution Comments: mild R hemipareisis, 2L O2, slow to initiate ?Restrictions ?Weight Bearing Restrictions: No ?RLE Weight Bearing: Non weight bearing ? ? ?Therapy/Group: Individual Therapy ? ? ?Excell Seltzer, PT, DPT, CSRS ?08/21/2021, 9:48 AM  ?

## 2021-08-21 NOTE — Progress Notes (Signed)
Physical Therapy Session Note ? ?Patient Details  ?Name: Edward Hopkins ?MRN: [id number] ?Date of Birth: [birth year] ? ?Today's Date: 08/21/2021 ?PT Individual Time: 3159-4585 ?PT Individual Time Calculation (min): 30 min  ? ?Short Term Goals: ?Week 1:  PT Short Term Goal 1 (Week 1): Pt will complete least restrictive transfer with min A consistently ?PT Short Term Goal 2 (Week 1): Pt will ambulate x 25 ft with LRAD and min A ?PT Short Term Goal 3 (Week 1): Pt will initiate stair training as safe and able ? ?Skilled Therapeutic Interventions/Progress Updates:  ?   ?Patient sitting up in bed on 2.5 L/min O2 preparing oatmeal upon PT arrival. Patient alert and agreeable to PT session. Patient denied pain during session, reports receiving pain medications for chest pain recently. Patient also reports a poor nights sleep and feeling fatigued this morning. Patient ate >75% of his oat meal and drank 3-4 sips of 2% milk while PT discussed current medical status, educated reason for continued O2 support and appropriate O2 saturation levels. Patient requesting to bathe this morning PT provided set-up assist as he finished eating. MD rounded and reinforced education and mobility status. States patient is cleared for mobility as tolerated with close monitoring for chest pain, SOB, and O2 saturations. Discussed code status both before and with MD during session, patient still deciding on this and concerned about family being involved in this decision. Patient asked appropriate questions about prognosis. Following MD rounding and completing breakfast, patient reported increased fatigue and asked to rest before bathing. Informed patient that OT is scheduled for a session from 11-12 for another opportunity for bathing, patient stated he would rest now and bathe during OT session. Patient missed 45 min of skilled PT due to fatigue, RN made aware. Will attempt to make-up missed time as able. Patient in bed at end of session with breaks  locked, bed alarm set, and all needs within reach.  ? ?Therapy Documentation ?Precautions:  ?Precautions ?Precautions: Fall ?Precaution Comments: mild R hemipareisis, 2L O2, slow to initiate ?Restrictions ?Weight Bearing Restrictions: No ?RLE Weight Bearing: Non weight bearing ?General: ?PT Amount of Missed Time (min): 45 Minutes ?PT Missed Treatment Reason: Patient fatigue ? ? ? ?Therapy/Group: Individual Therapy ? ?Doreene Burke PT, DPT ? ?08/21/2021, 8:40 AM  ?

## 2021-08-21 NOTE — Progress Notes (Signed)
Patient continues to report rt sided chest "pressure" pain at 8 out of 10 consistently. Oxy PRN provides some relief and patient able to sleep about half the night. Continue to monitor.  ?

## 2021-08-22 DIAGNOSIS — R5381 Other malaise: Secondary | ICD-10-CM

## 2021-08-22 LAB — CBC
HCT: 23.4 % — ABNORMAL LOW (ref 39.0–52.0)
Hemoglobin: 7.6 g/dL — ABNORMAL LOW (ref 13.0–17.0)
MCH: 30.8 pg (ref 26.0–34.0)
MCHC: 32.5 g/dL (ref 30.0–36.0)
MCV: 94.7 fL (ref 80.0–100.0)
Platelets: 456 10*3/uL — ABNORMAL HIGH (ref 150–400)
RBC: 2.47 MIL/uL — ABNORMAL LOW (ref 4.22–5.81)
RDW: 17.3 % — ABNORMAL HIGH (ref 11.5–15.5)
WBC: 7.6 10*3/uL (ref 4.0–10.5)
nRBC: 0 % (ref 0.0–0.2)

## 2021-08-22 LAB — BASIC METABOLIC PANEL
Anion gap: 5 (ref 5–15)
BUN: 10 mg/dL (ref 8–23)
CO2: 23 mmol/L (ref 22–32)
Calcium: 7.5 mg/dL — ABNORMAL LOW (ref 8.9–10.3)
Chloride: 104 mmol/L (ref 98–111)
Creatinine, Ser: 1.18 mg/dL (ref 0.61–1.24)
GFR, Estimated: >60 mL/min (ref >60)
Glucose, Bld: 91 mg/dL (ref 70–99)
Potassium: 3.9 mmol/L (ref 3.5–5.1)
Sodium: 132 mmol/L — ABNORMAL LOW (ref 135–145)

## 2021-08-22 MED ORDER — SORBITOL 70 % SOLN
30.0000 mL | Freq: Once | Status: AC
Start: 2021-08-22 — End: 2021-08-22
  Administered 2021-08-22: 30 mL via ORAL
  Filled 2021-08-22: qty 30

## 2021-08-22 MED ORDER — FLEET ENEMA 7-19 GM/118ML RE ENEM
1.0000 | ENEMA | Freq: Once | RECTAL | Status: AC
  Administered 2021-08-22: 1 via RECTAL
  Filled 2021-08-22: qty 1

## 2021-08-22 MED ORDER — ENOXAPARIN SODIUM 40 MG/0.4ML IJ SOSY
40.0000 mg | PREFILLED_SYRINGE | Freq: Every day | INTRAMUSCULAR | Status: DC
  Filled 2021-08-22: qty 0.4

## 2021-08-22 NOTE — Progress Notes (Addendum)
Patient noted to have drop in H/H likely due to hemodilution as AKI has resolved. Hemodynamically stable. Will recheck CBC in am. Reached out to Palliative care to see if anyone was available to talk with brother and nephew from [place] who were visiting but plan to go back home this evening. He has step-daughters but at this time [name] does not feel that they plan to visit.  Palliative care is unable to follow up today due to prior commitments and busy schedule. Mariana Kaufman plans on following up tomorrow.  ?

## 2021-08-22 NOTE — Progress Notes (Signed)
Patient ID: Edward Hopkins, male   DOB: 19-Jun-1950, 71 y.o.   MRN: [id number] ? ?SW confirmed with [employer] [phone]) that patient has active PCS services with [facility] [phone]). ? ?SW spoke with [name]/[facility] who confirm her remains active and waiting for pt to be d/c. SW informed on pt d/c date 4/19. Reports pt receives- 59hrs per month/ 13.5 per week 7 days per week 2hrs Monday-Saturday; 1.5 on Sunday.  ? ?[name], MSW, LCSWA ?Office: [phone] ?Cell: [phone] ?Fax: [phone]  ?

## 2021-08-22 NOTE — Consult Note (Signed)
Neuropsychological Consultation ? ? ?Patient:   Edward Hopkins  ? ?DOB:   1950/06/24 ? ?MR Number:  [id number] ? ?Location:  [place] [id number] [facility] ?[place] [address] ?Dept: [phone] ?Loc: [phone] ?          ?Date of Service:   08/22/2021 ? ?Start Time:   8 AM ?End Time:   9 AM ? ?Provider/Observer:  Ilean Skill, Psy.D.   ?    Clinical Neuropsychologist ?     ? ?Billing Code/Service: [id number] ? ?Chief Complaint:    Edward Hopkins is a 71 year old male with past history of HTN, thoracic aortic graft repair, Hepatitis C, polysubstance abuse with recent cocaine use and positive UDS for both cocaine and benzodiazepines.  CTA done revealing recurrent type B dissection and thoracic aortic graft repair area along with acute on chronic renal failure and wide swings in blood pressure.  Patient coping with extended hospital stay and significant weakness. ? ?Reason for Service:  Patient referred for neuropsychological consultation due to coping and adjustment with multiple complex medical issues and extended hospital stay.  Below is the HPI for the current admission. ? ?HPI: Edward Hopkins. Burgeson is a 71 year old male with history of HTN, thoracic aortic graft repair, Hep C (treated),  obesity--BMI 30 polysubstance abuse with recent cocaine use who was admitted on 07/27/21 with progressive chest and back pain. UDS positive for cocaine and benzos. CTA done revealing recurrent type B dissection with flap beginning distal to takeoff of L-SA and extending to takeoff of SMA.  Medical management recommended initially. He developed acute on chronic renal failure with rise in SCr 2.7 felt to be " due to contrast nephropathy as well as wide swing in BP with low BP". Norvasc discontinued with recommendations to allow BP to run slightly higher as well as b\ladder noted to be distended and PVR recommended to monitor voiding per nephrology. He  developed recurrent CP with CTA 3/20  revealing aortic rupture with mediastinal and retroperitoneal hematoma, new extension of dissection flap into celiac and SMA causing severe narrowing of origins with changes concerning for mesenteric ischemia, concerns of right renal ischemia and new small bilateral pleural effusions. He underwent arch aortogram with repair of descending thoracic dissection rupture with stent graft and stent graft of infrarenal abdominal aorta by Dr. Carlis Abbott on the same day.  ?  ?Post op with hypotension requiring pressors and had worsening of renal status with decrease in urine output initially. Urine output gradually improved and he required IV diuresis for fluid overload. He was extubated without difficulty but reported to have right facial droop and RLE weakness for a few days on 03/24. CT head done was negative for acute changes and  age related changes.  SCr peaked at 3.25 but slowly trending down and nephrology feels with lack of flow to right kidney, doubt renal status will return to baseline. SCr plateaued at 2.5-2.6 range/ and lasix changed to po. He was started on aranesp for anemia and is to follow up with nephrology on outpatient basis.  To repeat CTA on follow up with [facility] in a month.  ?  ?KUB done due to LLQ pain and showed evidence of ileus therefore suppository added. Patient noted to be deconditioned with lethargy, cognitive deficits with delay in initiation and hypoxia/dizziness with few steps. He continues on 3-4 L oxygen per [place].  CIR recommended due to functional decline.  ? ?  Current Status:  Patient was awake but somewhat drowsy laying in his bed with TV on.  I turn the TV off to allow for effective communication.  Patient aware of his medical status and a broader sense without great detail and description.  Patient admits to cocaine use both long-term as well as recent but claims that he has now been without cocaine for 2 to 3 weeks and in fact states he is "done with that."   And that it is no longer an issue.  Patient has no immediate close support here from family and most the family lives out of state. ? ?Behavioral Observation: Edward Hopkins  presents as a 71 y.o.-year-old Right handed African American Male who appeared his stated age. his dress was Appropriate and he was Well Groomed and his manners were Appropriate to the situation.  his participation was indicative of Drowsy and Intrusive behaviors.  There were physical disabilities noted.  he displayed an appropriate level of cooperation and motivation.   ? ? ?Interactions:    Active Inattentive and Redirectable ? ?Attention:   abnormal and attention span appeared shorter than expected for age ? ?Memory:   within normal limits; recent and remote memory intact ? ?Visuo-spatial:  not examined ? ?Speech (Volume):  low ? ?Speech:   normal; slurred ? ?Thought Process:  Coherent and Circumstantial ? ?Though Content:  WNL; not suicidal and not homicidal ? ?Orientation:   person, place, time/date, and situation ? ?Judgment:   Fair ? ?Planning:   Poor ? ?Affect:    Flat and Lethargic ? ?Mood:    Dysphoric ? ?Insight:   Fair ? ?Intelligence:   normal ? ?Substance Use:  There is a documented history of cocaine and prescription drug abuse confirmed by the patient. And UDS. ? ? ?Medical History:   ?Past Medical History:  ?Diagnosis Date  ? CHF (congestive heart failure) ([place])   ? Constipation   ? DVT (deep venous thrombosis) ([place]) 05/15/1993  ? Endocarditis   ? Hypertension   ? Intramural aortic hematoma ([facility])   ? Osteomyelitis ([place])   ? Presence of IVC filter   ? Right femoral vein DVT ([place]) 07/14/2014  ? Septic arthritis ([place])   ? both hips  (08/04/2014)  ? ? ?     ?Patient Active Problem List  ? Diagnosis Date Noted  ? Goals of care, counseling/discussion   ? Palliative care by specialist   ? Cerebral embolism with cerebral infarction 08/16/2021  ? Aortic aneurysm rupture ([place]) 08/11/2021  ? Hypertensive emergency 08/11/2021  ? Acute  respiratory failure with hypoxia ([place]) 08/11/2021  ? Ileus ([place]) 08/11/2021  ? Hemorrhagic shock ([place]) 08/11/2021  ? Thrombocytopenia ([place]) 08/11/2021  ? Chronic kidney disease (CKD), stage IV (severe) ([place]) 08/11/2021  ? Debility 08/11/2021  ? Cocaine abuse ([place])   ? Shock ([place])   ? Dissection of aorta ([place]) 07/27/2021  ? Hypercoagulable state ([place]) 10/21/2015  ? Pulmonary embolism ([place]) 10/21/2015  ? S/P ascending aortic replacement 08/27/2014  ? Acute kidney injury ([place]) 08/06/2014  ? Malnutrition of moderate degree ([place]) 08/05/2014  ? Orthostatic hypotension 08/04/2014  ? Hypotension 08/04/2014  ? Femoral DVT (deep venous thrombosis) ([facility])   ? Intramural aortic hematoma ([place]) 07/17/2014  ? ?Psychiatric History:  History of polysubstance abuse. ? ?Family Med/Psych History: History reviewed. No pertinent family history. ? ?Impression/DX:  VISHRUTH SEOANE is a 71 year old male with past history of HTN, thoracic aortic graft repair, Hepatitis C, polysubstance abuse with recent  cocaine use and positive UDS for both cocaine and benzodiazepines.  CTA done revealing recurrent type B dissection and thoracic aortic graft repair area along with acute on chronic renal failure and wide swings in blood pressure.  Patient coping with extended hospital stay and significant weakness. ? ?Patient was awake but somewhat drowsy laying in his bed with TV on.  I turn the TV off to allow for effective communication.  Patient aware of his medical status and a broader sense without great detail and description.  Patient admits to cocaine use both long-term as well as recent but claims that he has now been without cocaine for 2 to 3 weeks and in fact states he is "done with that."  And that it is no longer an issue.  Patient has no immediate close support here from family and most the family lives out of state. ? ? ?Disposition/Plan:  Worked on coping and adjustment issues with relatively limited involvement of the patient.  He was rather  fixated on getting a phone call to his emergency contact person who is coming to visit him today so he can get his cell phone and charger.  Patient discussed communication with his daughters but disappointment t

## 2021-08-22 NOTE — Progress Notes (Addendum)
?                                                       PROGRESS NOTE ? ? ?Subjective/Complaints: ? ?Took O2 off- family not here yet.  ?LBM was "days ago"- feels constipated.  ? ? ?Was documented LBM as 3 days ago.  ? ? ? ? ?ROS- ? ?Pt denies SOB, abd pain, CP, N/V/C/D, and vision changes ? ? ?Objective: ?  ?No results found. ?Recent Labs  ?  08/22/21 ?0642  ?WBC 7.6  ?HGB 7.6*  ?HCT 23.4*  ?PLT 456*  ? ?Recent Labs  ?  08/22/21 ?0642  ?NA 132*  ?K 3.9  ?CL 104  ?CO2 23  ?GLUCOSE 91  ?BUN 10  ?CREATININE 1.18  ?CALCIUM 7.5*  ? ? ? ?Intake/Output Summary (Last 24 hours) at 08/22/2021 1417 ?Last data filed at 08/22/2021 [phone] ?Gross per 24 hour  ?Intake 358 ml  ?Output 300 ml  ?Net 58 ml  ?  ? ?  ? ?Physical Exam: ?Vital Signs ?Blood pressure 115/62, pulse 76, temperature 98.9 ?F (37.2 ?C), temperature source Oral, resp. rate 18, height 5\' 8"  (1.727 m), weight 81.2 kg, SpO2 100 %. ? ? ? ?General: awake, alert, appropriate, sitting up slightly in bed; NAD ?HENT: conjugate gaze; oropharynx moist- not wearing O2-  ?CV: regular rate; no JVD ?Pulmonary: CTA B/L; no W/R/R- good air movement ?GI: soft, NT, ND, (+)BS ?Psychiatric: appropriate ?Neurological: Ox3; vague ? ? Skin: RUE less swelling trace to 1+- hand - esp hand better, but possible tissue necrosis- looks more erythematous- not raised- looks the same ?LUE 5/5 ?RUE 4+/5 delt bi, tri grip ?LLE- 5/5 ?RLE- 5-/5- weaker on R side  ?Ext  ?2+ LE edema to mid calf and 1-2+ RUE swelling  ?Neurological:  ?   Mental Status: He is oriented to person, place, and time.  ?   Comments: Left facial weakness. Lethargic and tended to drift off. Internally distracted and needed cues to follow simple motor commands. He was able to use calender independently to recall date. Able to recall age and DOB.   ?Sleepy, but Ox3- knew at [facility] cone and why; and month/year ?Sensation intact in all 4 extremities  ?Assessment/Plan: ?1. Functional deficits which require 3+ hours per day of  interdisciplinary therapy in a comprehensive inpatient rehab setting. ?Physiatrist is providing close team supervision and 24 hour management of active medical problems listed below. ?Physiatrist and rehab team continue to assess barriers to discharge/monitor patient progress toward functional and medical goals ? ?Care Tool: ? ?Bathing ?   ?Body parts bathed by patient: Chest, Abdomen, Front perineal area, Left arm, Right arm, Face  ? Body parts bathed by helper: Buttocks, Right upper leg, Left upper leg, Left lower leg, Right lower leg ?  ?  ?Bathing assist Assist Level: Moderate Assistance - Patient 50 - 74% ?  ?  ?Upper Body Dressing/Undressing ?Upper body dressing   ?What is the patient wearing?: Pull over shirt ?   ?Upper body assist Assist Level: Maximal Assistance - Patient 25 - 49% ?   ?Lower Body Dressing/Undressing ?Lower body dressing ? ? ?   ?What is the patient wearing?: Incontinence brief ? ?  ? ?Lower body assist Assist for lower body dressing: Minimal Assistance - Patient > 75% ?   ? ?Toileting ?  Toileting    ?Toileting assist Assist for toileting: 2 Helpers ?  ?  ?Transfers ?Chair/bed transfer ? ?Transfers assist ?   ? ?Chair/bed transfer assist level: Minimal Assistance - Patient > 75% ?  ?  ?Locomotion ?Ambulation ? ? ?Ambulation assist ? ? Ambulation activity did not occur: Safety/medical concerns ? ?  ?  ?   ? ?Walk 10 feet activity ? ? ?Assist ? Walk 10 feet activity did not occur: Safety/medical concerns ? ?  ?   ? ?Walk 50 feet activity ? ? ?Assist Walk 50 feet with 2 turns activity did not occur: Safety/medical concerns ? ?  ?   ? ? ?Walk 150 feet activity ? ? ?Assist Walk 150 feet activity did not occur: Safety/medical concerns ? ?  ?  ?  ? ?Walk 10 feet on uneven surface  ?activity ? ? ?Assist Walk 10 feet on uneven surfaces activity did not occur: Safety/medical concerns ? ? ?  ?   ? ?Wheelchair ? ? ? ? ?Assist Is the patient using a wheelchair?: Yes ?Type of Wheelchair: Manual ?  ? ?  ?    ? ? ?Wheelchair 50 feet with 2 turns activity ? ? ? ?Assist ? ?  ?Wheelchair 50 feet with 2 turns activity did not occur: Safety/medical concerns ? ? ?   ? ?Wheelchair 150 feet activity  ? ? ? ?Assist ? Wheelchair 150 feet activity did not occur: Safety/medical concerns ? ? ?   ? ?Blood pressure 115/62, pulse 76, temperature 98.9 ?F (37.2 ?C), temperature source Oral, resp. rate 18, height 5\' 8"  (1.727 m), weight 81.2 kg, SpO2 100 %. ? ?Medical Problem List and Plan: ?1. Functional deficits secondary to debility from  thoracic dissection of aneurysm  ?            -patient may  shower ?            -ELOS/Goals: 12-14 days min A to supervision ? D/c date 4/19 ? Continue CIR- PT, OT and SLP- family to come in to discuss with  ?2.  Antithrombotics: ?-DVT/anticoagulation:  Pharmaceutical: Lovenox. Will order dopplers of RUE.  ?            -antiplatelet therapy: N/A ?3. Pain Management:  oxycodone prn  ? 4/4- having a lot of lower chest and back pain- con't pain meds- will add lidocaine patches for night time for back pain.  ? 4/7- pain doing better- con't regimen ?4. Mood: LCSW to follow for evaluation and support.  ?            -antipsychotic agents: N/A ?5. Neuropsych: This patient may be  capable of making decisions on his own behalf. ?6. Skin/Wound Care: Routine pressure relief measures.  Monitor areas on right forearm/wrist for demarcation ?            -will add Vitamin C and Zinc to promote healing.  ?7. Fluids/Electrolytes/Nutrition: Monitor I/O. Check lytes in am.  ?8. Ruptured  descending thoracic aneurysm s/p repair: BP management.  ?            --will need repeat CTA 4 weeks (monitoring SCr  for improvement) ?9. Acute on chronic renal failure: with new CKD Stage IV Improvement in BUN/SCr- 39/2.38 ?            --monitor with serial checks. Avoid nephrotoxic medications.  ? 3/31- Cr and BUN stable con't to monitor weekly.  ? 4/3- Cr still 2.3-2.4- is stable- per Vascular wants pt kidney function  to improve some,  before CTA< however need to check for PE/so will do both ? 4/6- labs in AM ? 4/7- Cr 2.46- very slightly increased- will con't regimen ? 4/10- Cr 1.18- much lower- and BUN 10- will recheck in AM ?10. Acute blood loss anemia: Recheck CBC in am.  ? 4/3- Hb very slightly less at 8.5- will monitor ? 4/10- Hb down to 7.6- wondering if is patient's vs just hemodilution.  ?11. Hypoxia: Wean oxygen as tolerated. Monitor for signs of overload ?            --Off diuresis. Resp rate increase with minimal movement- will monitor O2 sats.  ? 3/31- monitor closely, since  SOB with minimal exertion. ?4/3- still requiring O2- will check for PE this evening- since pt's still has DOE.  ?4/4- Couldn't do CTA- so ordered VQ scabn- pending.   ?4/5- has PE- but no DVT- has filter from 1995- d/w pulmonary- risk of AC is worse than no AC- will hold off- due to high risk of bleeding profusely per vascular-  ?Asking palliative care to help with goals.  ? 4/6- pt wants to be full code right now palliative care ot see again today- family coming today.  ?4/7- O2 sats lower this AM- on RA- needed to be put back on O2 ?4/10- does well when wears O2.  ?12. ABLA: Improving with Epogen and thrombocytopenia has resolved.   ? 3/31- Hb 8.7- stable to slightly down- was 9.1 a few days ago- ? 4/3- Hb 8.5- overall stable ?13. Polysubstance abuse: Educated on importance of cessation ?            --smokes > 1 PPD and marijuana/cocaine-->Has planned to quit.  ?14. Obesity: BMI 30.- initially documented as BMI of 68- was wrong- Educate on diet and importance of weight loss to help promote mobility and overall health.  ?15. Ileus: Has been refusing daily suppository--last BM on 03/25.  ?--Intake poor--0-50% in the past 24 hours with abdominal distension. ?--Will increase Miralax to bid.Check KUB for follow up.  ?3/31- KUB looks better, but still distended loops of bowel- not eating much- will start Reglan 5 mg TID for the moment and see if things improve-KUB  improving today , add dulc supp, mylicon ?4/6- LBM 2+ days ago- will give miralax since had too aggressive response ot sorbitol ?4/7- will ry Sorbitol 30cc since no BM yet ? 4/10- BM Friday- needs more sorbitol

## 2021-08-22 NOTE — Progress Notes (Signed)
Physical Therapy Session Note ? ?Patient Details  ?Name: Edward Hopkins ?MRN: [id number] ?Date of Birth: May 07, 1951 ? ?Today's Date: 08/22/2021 ?PT Individual Time: 8921-1941 ?PT Individual Time Calculation (min): 23 min  ? ?Short Term Goals: ?Week 2:  PT Short Term Goal 1 (Week 2): =LTG due to ELOS ? ?Skilled Therapeutic Interventions/Progress Updates:  ?  Pt received seated in bed for make up therapy session. Pt agreeable to participate in PT session and agreeable to attempt to get up to reclining back w/c so his nephew can cut his hair. Pt reports ongoing chest pain, not rated. Pt able to receive Tylenol from nursing at beginning of session. Supine to sit with mod A needed for trunk elevation, use of hospital bed features. Pt reports onset of dizziness and "fogginess" upon sitting up to EOB and requires min to mod A for trunk control in sitting. Seated BP 102/62, HR 80 bpm, SpO2 99% while on 2L O2. Pt requests to lay back down rather than transfer to w/c due to dizziness. Sit to supine mod A needed for BLE management. Pt reports improvement in symptoms once in supine position though some dizziness remains. Supine BP 127/60 and all other vitals WNL. Pt declines any further attempts to sit up this date. Pt left seated in bed with needs in reach, bed alarm in place, family present. Educated pt family on how they can use hospital bed features to position pt to tolerance to attempt to cut his hair if he is feeling up for it later, provided extra linens. ? ?Therapy Documentation ?Precautions:  ?Precautions ?Precautions: Fall ?Precaution Comments: mild R hemipareisis, 2L O2, slow to initiate ?Restrictions ?Weight Bearing Restrictions: No ?RLE Weight Bearing: Non weight bearing ? ? ? ? ?Therapy/Group: Individual Therapy ? ? ?Excell Seltzer, PT, DPT, CSRS ?08/22/2021, 2:22 PM  ?

## 2021-08-22 NOTE — Progress Notes (Signed)
Occupational Therapy Session Note ? ?Patient Details  ?Name: Edward Hopkins ?MRN: [id number] ?Date of Birth: 11/17/1950 ? ?Today's Date: 08/22/2021 ?OT Individual Time: 7867-5449 ?OT Individual Time Calculation (min): 46 min  ?14 mins missed d/t fatigue  ? ? ?Short Term Goals: ?Week 2:  OT Short Term Goal 1 (Week 2): Pt will be able to sit to EOB with min A within 2 minutes demonstrating improved initiation and motor skills. ?OT Short Term Goal 2 (Week 2): sitting EOB, pt will don tshirt with min A. ?OT Short Term Goal 3 (Week 2): Pt will be able to stand with min A to RW within 2 minutes to prep for LB dressing. ?OT Short Term Goal 4 (Week 2): Pt will be able to transfer to [facility] with mod A. ? ?Skilled Therapeutic Interventions/Progress Updates:  ?Pt greeted supine in bed with family present, pt agreeable to OT intervention. Session focus on BUE therex to increase overall strength and endurance for higher level functional mobility tasks as well as self care reeducation. Pt completed below therex with 5 lb hand weights:         ?X15 bicep curls  ?X15 chest presses  ?2.5 L [place] SpO2 98%       ? ?Pt then reports fatigue declining further therex but agreeable to bed level ADLs with pt washing face with supervision. Extensive time spent discussing importance of OOB mobility with family agreeing/encouraging pt to mobilize OOB. Pt still declines trying during our session. Family also requesting to speak with MD/PA, located PA and asked her to step at her earliest convenience to answer family questions.   pt left supine in bed with bed alarm activated and all needs within reach.                    ? ? ?Therapy Documentation ?Precautions:  ?Precautions ?Precautions: Fall ?Precaution Comments: mild R hemipareisis, 2L O2, slow to initiate ?Restrictions ?Weight Bearing Restrictions: No ?RLE Weight Bearing: Non weight bearing ? ?  ?Pain: no pain reported during session  ? ? ? ?Therapy/Group: Individual Therapy ? ?Precious Haws ?08/22/2021, 12:26 PM ?

## 2021-08-22 NOTE — Progress Notes (Signed)
Physical Therapy Session Note ? ?Patient Details  ?Name: Edward Hopkins ?MRN: [id number] ?Date of Birth: 08/24/1950 ? ?Today's Date: 08/22/2021 ?PT Individual Time: 3338-3291 ?PT Individual Time Calculation (min): 15 min  ? ?Short Term Goals: ?Week 2:  PT Short Term Goal 1 (Week 2): =LTG due to ELOS ? ?Skilled Therapeutic Interventions/Progress Updates:  ?  Pt received seated in bed, agreeable to PT session. Pt reports intermittent ongoing chest pain, not rated. SpO2 99% on 2L O2 via [place] and HR 80 bpm, unable to obtain accurate BP reading at this time. Pt agreeable to sit up to EOB, however family members from out of town that he has been waiting on seeing arrive during session. Pt given time to catch up with family members, will return at a later time to make up therapy time. Pt missed 30 min of scheduled therapy session to allow time to discuss his health with his family. ? ?Therapy Documentation ?Precautions:  ?Precautions ?Precautions: Fall ?Precaution Comments: mild R hemipareisis, 2L O2, slow to initiate ?Restrictions ?Weight Bearing Restrictions: No ?RLE Weight Bearing: Non weight bearing ?General: ?PT Amount of Missed Time (min): 30 Minutes ?PT Missed Treatment Reason: Unavailable (Comment) (pt with family visiting) ? ? ? ? ?Therapy/Group: Individual Therapy ? ? ?Excell Seltzer, PT, DPT, CSRS ?08/22/2021, 12:25 PM  ?

## 2021-08-22 NOTE — Progress Notes (Signed)
Family notified that palliative care would speak with them tomorrow. Patient and family agreed to call Delia Heady, for telephone conference. If unavailable can call nephew, Lucia Gaskins. Family will be leaving town and only available to discuss over phone.  ?

## 2021-08-22 NOTE — Progress Notes (Signed)
Pt actively having bowel sounds. Pt requested to try suppository first before trying enema. Pt tolerates well.  ?

## 2021-08-22 NOTE — Progress Notes (Signed)
Occupational Therapy Session Note ? ?Patient Details  ?Name: Edward Hopkins ?MRN: [id number] ?Date of Birth: May 30, 1950 ? ?Today's Date: 08/22/2021 ?OT Missed Time: 30 Minutes ?Missed Time Reason: Patient fatigue ? ? ?Short Term Goals: ?Week 1:  OT Short Term Goal 1 (Week 1): Pt will be able to sit to EOB with min A within 2 minutes demonstrating improved initiation and motor skills. ?OT Short Term Goal 1 - Progress (Week 1): Progressing toward goal ?OT Short Term Goal 2 (Week 1): sitting EOB, pt will don tshirt with min A. ?OT Short Term Goal 2 - Progress (Week 1): Progressing toward goal ?OT Short Term Goal 3 (Week 1): Pt will be able to stand with min A to RW within 2 minutes to prep for LB dressing. ?OT Short Term Goal 3 - Progress (Week 1): Progressing toward goal ?OT Short Term Goal 4 (Week 1): Pt will be able to transfer to [facility] with mod A. ?OT Short Term Goal 4 - Progress (Week 1): Progressing toward goal ?Week 2:  OT Short Term Goal 1 (Week 2): Pt will be able to sit to EOB with min A within 2 minutes demonstrating improved initiation and motor skills. ?OT Short Term Goal 2 (Week 2): sitting EOB, pt will don tshirt with min A. ?OT Short Term Goal 3 (Week 2): Pt will be able to stand with min A to RW within 2 minutes to prep for LB dressing. ?OT Short Term Goal 4 (Week 2): Pt will be able to transfer to [facility] with mod A. ? ?Skilled Therapeutic Interventions/Progress Updates:  ?  Pt had therapy scheduled for 30 minutes, but he had 4 family members that were still visiting with him. Pt requested not to do therapy due to this and that he was feeling very fatigued.   ?Told pt I would check with him later at 4pm to see if he could participate then.   ?Returned at 4:10 to see pt , pt resting in bed stating he was too tired and he had just received medication to make him go to the bathroom so he did not feel like "jumping around".   Pt declined therapy.   ? ?Therapy Documentation ?Precautions:  ?Precautions ?Precautions:  Fall ?Precaution Comments: mild R hemipareisis, 2L O2, slow to initiate ?Restrictions ?Weight Bearing Restrictions: No ?RLE Weight Bearing: Non weight bearing ?  ?Pain: ?Pain Assessment ?Pain Scale: 0-10 ?Pain Score: 4  ?Pain Type: Acute pain ?Pain Location: Head ? ? ?Therapy/Group: Individual Therapy ? ?[place] [facility] ?08/22/2021, 8:58 AM ?

## 2021-08-23 LAB — CBC
HCT: 29.1 % — ABNORMAL LOW (ref 39.0–52.0)
Hemoglobin: 9.3 g/dL — ABNORMAL LOW (ref 13.0–17.0)
MCH: 27.4 pg (ref 26.0–34.0)
MCHC: 32 g/dL (ref 30.0–36.0)
MCV: 85.8 fL (ref 80.0–100.0)
Platelets: 261 10*3/uL (ref 150–400)
RBC: 3.39 MIL/uL — ABNORMAL LOW (ref 4.22–5.81)
RDW: 14.6 % (ref 11.5–15.5)
WBC: 6.3 10*3/uL (ref 4.0–10.5)
nRBC: 0 % (ref 0.0–0.2)

## 2021-08-23 LAB — BASIC METABOLIC PANEL
Anion gap: 7 (ref 5–15)
BUN: 40 mg/dL — ABNORMAL HIGH (ref 8–23)
CO2: 29 mmol/L (ref 22–32)
Calcium: 8.8 mg/dL — ABNORMAL LOW (ref 8.9–10.3)
Chloride: 96 mmol/L — ABNORMAL LOW (ref 98–111)
Creatinine, Ser: 2.49 mg/dL — ABNORMAL HIGH (ref 0.61–1.24)
GFR, Estimated: 27 mL/min — ABNORMAL LOW (ref >60)
Glucose, Bld: 98 mg/dL (ref 70–99)
Potassium: 4.4 mmol/L (ref 3.5–5.1)
Sodium: 132 mmol/L — ABNORMAL LOW (ref 135–145)

## 2021-08-23 MED ORDER — ENOXAPARIN SODIUM 30 MG/0.3ML IJ SOSY
30.0000 mg | PREFILLED_SYRINGE | Freq: Every day | INTRAMUSCULAR | Status: DC
  Administered 2021-08-23 – 2021-09-07 (×15): 30 mg via SUBCUTANEOUS
  Filled 2021-08-23 (×16): qty 0.3

## 2021-08-23 MED ORDER — MIRTAZAPINE 15 MG PO TABS
15.0000 mg | ORAL_TABLET | Freq: Every day | ORAL | Status: DC
  Administered 2021-08-24 – 2021-09-06 (×14): 15 mg via ORAL
  Filled 2021-08-23 (×15): qty 1

## 2021-08-23 NOTE — Progress Notes (Signed)
Occupational Therapy Session Note ? ?Patient Details  ?Name: Edward Hopkins ?MRN: [id number] ?Date of Birth: 1951-03-02 ? ?Today's Date: 08/23/2021 ?OT Individual Time: 5300-5110 ?OT Individual Time Calculation (min): 45 min  and Today's Date: 08/23/2021 ?OT Missed Time: 15 Minutes ?Missed Time Reason: Patient fatigue ? ? ?Short Term Goals: ?Week 2:  OT Short Term Goal 1 (Week 2): Pt will be able to sit to EOB with min A within 2 minutes demonstrating improved initiation and motor skills. ?OT Short Term Goal 2 (Week 2): sitting EOB, pt will don tshirt with min A. ?OT Short Term Goal 3 (Week 2): Pt will be able to stand with min A to RW within 2 minutes to prep for LB dressing. ?OT Short Term Goal 4 (Week 2): Pt will be able to transfer to [facility] with mod A. ? ?Skilled Therapeutic Interventions/Progress Updates:  ?  Pt resting in bed upon arrival.2L O2 and sats>94%. Lunch tray on bedside table. Pt reports that he "just can't eat it." Pt reports that he just doesn't want to eat. Pt asked if he could "eat through an IV." MD notified of pt's lack of appetite.  ? ?Pt completed therex with 5# bar bells: ?3x15 biceps ?3x15 chest presses ? ?Pt fatigued and unable to continue. Informed pt of plan to take him outside tomorrow. Pt with no emotion and worried that he doesn't want to eat.  ? ?Pt remained in bed with bed alarm activated. All needs within reach.  ? ?Therapy Documentation ?Precautions:  ?Precautions ?Precautions: Fall ?Precaution Comments: mild R hemipareisis, 2L O2, slow to initiate ?Restrictions ?Weight Bearing Restrictions: No ?RLE Weight Bearing: Non weight bearing ?General: ?General ?OT Amount of Missed Time: 15 Minutes ?Vital Signs: ?  ?Pain: ? Pt denies pain this afternoon ? ? ? ?Therapy/Group: Individual Therapy ? ?[name] ?08/23/2021, 1:52 PM ?

## 2021-08-23 NOTE — Progress Notes (Signed)
?                                                       PROGRESS NOTE ? ? ?Subjective/Complaints: ? ?Had large Bms last night/this AM- stomach still "bubbling". Is uncomfortable. Didn't eat much as a result.  ?RUE swelling has resolved.  ? ? ?ROS- ? ?Pt denies SOB, (+) abd pain, CP, N/V/C/D, and vision changes ? ? ?Objective: ?  ?No results found. ?Recent Labs  ?  08/22/21 ?0642 08/23/21 ?6644  ?WBC 7.6 6.3  ?HGB 7.6* 9.3*  ?HCT 23.4* 29.1*  ?PLT 456* 261  ? ?Recent Labs  ?  08/22/21 ?0642 08/23/21 ?0347  ?NA 132* 132*  ?K 3.9 4.4  ?CL 104 96*  ?CO2 23 29  ?GLUCOSE 91 98  ?BUN 10 40*  ?CREATININE 1.18 2.49*  ?CALCIUM 7.5* 8.8*  ? ? ? ?Intake/Output Summary (Last 24 hours) at 08/23/2021 0913 ?Last data filed at 08/23/2021 0802 ?Gross per 24 hour  ?Intake 313 ml  ?Output 700 ml  ?Net -387 ml  ?  ? ?  ? ?Physical Exam: ?Vital Signs ?Blood pressure 116/66, pulse 75, temperature 98.6 ?F (37 ?C), temperature source Oral, resp. rate 18, height 5\' 8"  (1.727 m), weight 82.9 kg, SpO2 100 %. ? ? ? ? ?General: awake, alert, appropriate, laying in bed; c/o stomach bubbling; NAD ?HENT: conjugate gaze; oropharynx moist ?CV: regular rate; no JVD ?Pulmonary: CTA B/L; no W/R/R- good air movement ?GI: soft, NT, protuberant; hyperactive- sounds "bubbling".  ?Psychiatric: appropriate- flat ?Neurological: vague- alert- asking to go outside ? Skin: RUE less swelling trace to 1+- hand - esp hand better, but possible tissue necrosis- looks more erythematous- not raised- much better- swelling resolved of RUE ?LUE 5/5 ?RUE 4+/5 delt bi, tri grip ?LLE- 5/5 ?RLE- 5-/5- weaker on R side  ?Ext  ?2+ LE edema to mid calf and 1-2+ RUE swelling  ?Neurological:  ?   Mental Status: He is oriented to person, place, and time.  ?   Comments: Left facial weakness. Lethargic and tended to drift off. Internally distracted and needed cues to follow simple motor commands. He was able to use calender independently to recall date. Able to recall age and DOB.    ?Sleepy, but Ox3- knew at [facility] and why; and month/year ?Sensation intact in all 4 extremities  ?Assessment/Plan: ?1. Functional deficits which require 3+ hours per day of interdisciplinary therapy in a comprehensive inpatient rehab setting. ?Physiatrist is providing close team supervision and 24 hour management of active medical problems listed below. ?Physiatrist and rehab team continue to assess barriers to discharge/monitor patient progress toward functional and medical goals ? ?Care Tool: ? ?Bathing ?   ?Body parts bathed by patient: Chest, Abdomen  ? Body parts bathed by helper: Buttocks, Right upper leg, Left upper leg, Left lower leg, Right lower leg ?  ?  ?Bathing assist Assist Level: Moderate Assistance - Patient 50 - 74% ?  ?  ?Upper Body Dressing/Undressing ?Upper body dressing   ?What is the patient wearing?: Pull over shirt ?   ?Upper body assist Assist Level: Dependent - Patient 0% ?   ?Lower Body Dressing/Undressing ?Lower body dressing ? ? ?   ?What is the patient wearing?: Incontinence brief ? ?  ? ?Lower body assist Assist for lower body dressing: Minimal  Assistance - Patient > 75% ?   ? ?Toileting ?Toileting    ?Toileting assist Assist for toileting: 2 Helpers ?  ?  ?Transfers ?Chair/bed transfer ? ?Transfers assist ?   ? ?Chair/bed transfer assist level: Minimal Assistance - Patient > 75% ?  ?  ?Locomotion ?Ambulation ? ? ?Ambulation assist ? ? Ambulation activity did not occur: Safety/medical concerns ? ?  ?  ?   ? ?Walk 10 feet activity ? ? ?Assist ? Walk 10 feet activity did not occur: Safety/medical concerns ? ?  ?   ? ?Walk 50 feet activity ? ? ?Assist Walk 50 feet with 2 turns activity did not occur: Safety/medical concerns ? ?  ?   ? ? ?Walk 150 feet activity ? ? ?Assist Walk 150 feet activity did not occur: Safety/medical concerns ? ?  ?  ?  ? ?Walk 10 feet on uneven surface  ?activity ? ? ?Assist Walk 10 feet on uneven surfaces activity did not occur: Safety/medical concerns ? ? ?   ?   ? ?Wheelchair ? ? ? ? ?Assist Is the patient using a wheelchair?: Yes ?Type of Wheelchair: Manual ?  ? ?  ?   ? ? ?Wheelchair 50 feet with 2 turns activity ? ? ? ?Assist ? ?  ?Wheelchair 50 feet with 2 turns activity did not occur: Safety/medical concerns ? ? ?   ? ?Wheelchair 150 feet activity  ? ? ? ?Assist ? Wheelchair 150 feet activity did not occur: Safety/medical concerns ? ? ?   ? ?Blood pressure 116/66, pulse 75, temperature 98.6 ?F (37 ?C), temperature source Oral, resp. rate 18, height 5\' 8"  (1.727 m), weight 82.9 kg, SpO2 100 %. ? ?Medical Problem List and Plan: ?1. Functional deficits secondary to debility from  thoracic dissection of aneurysm  ?            -patient may  shower ?            -ELOS/Goals: 12-14 days min A to supervision ? D/c date 4/19 ? Continue CIR- PT, OT and SLP ? Team conference to f/u on progress ?2.  Antithrombotics: ?-DVT/anticoagulation:  Pharmaceutical: Lovenox. Will order dopplers of RUE.  ?            -antiplatelet therapy: N/A ?3. Pain Management:  oxycodone prn  ? 4/4- having a lot of lower chest and back pain- con't pain meds- will add lidocaine patches for night time for back pain.  ? 4/11- pain usually controlled with meds ?4. Mood: LCSW to follow for evaluation and support.  ?            -antipsychotic agents: N/A ?5. Neuropsych: This patient may be  capable of making decisions on his own behalf. ?6. Skin/Wound Care: Routine pressure relief measures.  Monitor areas on right forearm/wrist for demarcation ?            -will add Vitamin C and Zinc to promote healing.  ?7. Fluids/Electrolytes/Nutrition: Monitor I/O. Check lytes in am.  ?8. Ruptured  descending thoracic aneurysm s/p repair: BP management.  ?            --will need repeat CTA 4 weeks (monitoring SCr  for improvement) ?9. Acute on chronic renal failure: with new CKD Stage IV Improvement in BUN/SCr- 39/2.38 ?            --monitor with serial checks. Avoid nephrotoxic medications.  ? 3/31- Cr and BUN stable  con't to monitor weekly.  ? 4/3- Cr  still 2.3-2.4- is stable- per Vascular wants pt kidney function to improve some, before CTA< however need to check for PE/so will do both ? 4/6- labs in AM ? 4/7- Cr 2.46- very slightly increased- will con't regimen ? 4/11- Cr 2.49 and BUN 40-  ?10. Acute blood loss anemia: Recheck CBC in am.  ? 4/3- Hb very slightly less at 8.5- will monitor ? 4/10- Hb down to 7.6- wondering if is patient's vs just hemodilution.  ?11. Hypoxia: Wean oxygen as tolerated. Monitor for signs of overload ?            --Off diuresis. Resp rate increase with minimal movement- will monitor O2 sats.  ? 3/31- monitor closely, since  SOB with minimal exertion. ?4/3- still requiring O2- will check for PE this evening- since pt's still has DOE.  ?4/4- Couldn't do CTA- so ordered VQ scabn- pending.   ?4/5- has PE- but no DVT- has filter from 1995- d/w pulmonary- risk of AC is worse than no AC- will hold off- due to high risk of bleeding profusely per vascular-  ?Asking palliative care to help with goals.  ? 4/6- pt wants to be full code right now palliative care ot see again today- family coming today.  ?4/7- O2 sats lower this AM- on RA- needed to be put back on O2 ?4/10- does well when wears O2.  ?12. ABLA: Improving with Epogen and thrombocytopenia has resolved.   ? 3/31- Hb 8.7- stable to slightly down- was 9.1 a few days ago- ? 4/3- Hb 8.5- overall stable ? ?4/11- Hb 9.3- improving.  ?13. Polysubstance abuse: Educated on importance of cessation ?            --smokes > 1 PPD and marijuana/cocaine-->Has planned to quit.  ?14. Obesity: BMI 30.- initially documented as BMI of 68- was wrong- Educate on diet and importance of weight loss to help promote mobility and overall health.  ?15. Ileus: Has been refusing daily suppository--last BM on 03/25.  ?--Intake poor--0-50% in the past 24 hours with abdominal distension. ?--Will increase Miralax to bid.Check KUB for follow up.  ?3/31- KUB looks better, but still  distended loops of bowel- not eating much- will start Reglan 5 mg TID for the moment and see if things improve-KUB improving today , add dulc supp, mylicon ?4/10- BM Friday- needs more sorbitol and Fleet's

## 2021-08-23 NOTE — [place] FL2 (Signed)
?[place] MEDICAID FL2 LEVEL OF CARE SCREENING TOOL  ?  ? ?IDENTIFICATION  ?Patient Name: ?Edward Hopkins Birthdate: 12-May-1951 Sex: male Admission Date (Current Location): ?08/11/2021  ?[place] and [place] Number: ? [place] ?  Facility and Address:  ?[facility], [place] [address], [place], [place] [address] ?     Provider Number: ?[id number]  ?Attending Physician Name and Address:  ?Courtney Heys, MD ? Relative Name and Phone Number:  ?Marrian Salvage (partner) [phone] ?   ?Current Level of Care: ?Hospital Recommended Level of Care: ?[place] Prior Approval Number: ?  ? ?Date Approved/Denied: ?  PASRR Number: ?[id number] A ? ?Discharge Plan: ?Hospital ?  ? ?Current Diagnoses: ?Patient Active Problem List  ? Diagnosis Date Noted  ? Goals of care, counseling/discussion   ? Palliative care by specialist   ? Cerebral embolism with cerebral infarction 08/16/2021  ? Aortic aneurysm rupture ([place]) 08/11/2021  ? Hypertensive emergency 08/11/2021  ? Acute respiratory failure with hypoxia ([place]) 08/11/2021  ? Ileus ([place]) 08/11/2021  ? Hemorrhagic shock ([place]) 08/11/2021  ? Thrombocytopenia ([place]) 08/11/2021  ? Chronic kidney disease (CKD), stage IV (severe) ([place]) 08/11/2021  ? Debility 08/11/2021  ? Cocaine abuse ([place])   ? Shock ([place])   ? Dissection of aorta ([place]) 07/27/2021  ? Hypercoagulable state ([place]) 10/21/2015  ? Pulmonary embolism ([place]) 10/21/2015  ? S/P ascending aortic replacement 08/27/2014  ? Acute kidney injury ([place]) 08/06/2014  ? Malnutrition of moderate degree ([place]) 08/05/2014  ? Orthostatic hypotension 08/04/2014  ? Hypotension 08/04/2014  ? Femoral DVT (deep venous thrombosis) (H[facility])   ? Intramural aortic hematoma ([place]) 07/17/2014  ? ? ?Orientation RESPIRATION BLADDER Height & Weight   ?  ?Self, Time, Situation, Place ? O2 (2L [place]) Incontinent Weight: 182 lb 12.2 oz (82.9 kg) ?Height:  5\' 8"  (172.7 cm)  ?BEHAVIORAL SYMPTOMS/MOOD NEUROLOGICAL BOWEL NUTRITION  STATUS  ?    Incontinent Diet (Heart healthy/carb; thin liquids)  ?AMBULATORY STATUS COMMUNICATION OF NEEDS Skin   ?Limited Assist Verbally Normal ?  ?  ?  ?    ?     ?     ? ? ?Personal Care Assistance Level of Assistance  ?Bathing, Dressing Bathing Assistance: Limited assistance ?  ?Dressing Assistance: Limited assistance ?   ? ?Functional Limitations Info  ?Sight, Hearing, Speech Sight Info: Adequate ?Hearing Info: Adequate ?Speech Info: Adequate  ? ? ?SPECIAL CARE FACTORS FREQUENCY  ?PT (By licensed PT), OT (By licensed OT)   ?  ?PT Frequency: 5xs per week ?OT Frequency: 5xs per week ?  ?  ?  ?   ? ? ?Contractures Contractures Info: Not present  ? ? ?Additional Factors Info  ?Code Status, Allergies Code Status Info: Full ?Allergies Info: NKA ?  ?  ?  ?   ? ?Current Medications (08/23/2021):  This is the current hospital active medication list ?Current Facility-Administered Medications  ?Medication Dose Route Frequency Provider Last Rate Last Admin  ? acetaminophen (TYLENOL) tablet 325-650 mg  325-650 mg Oral Q4H PRN Bary Leriche, PA-C   650 mg at 08/22/21 1320  ? alum & mag hydroxide-simeth (MAALOX/MYLANTA) 200-200-20 MG/5ML suspension 30 mL  30 mL Oral Q4H PRN Bary Leriche, PA-C   30 mL at 08/15/21 1355  ? artificial tears (LACRILUBE) ophthalmic ointment   Both Eyes QHS PRN Love, Pamela S, PA-C      ? ascorbic acid (VITAMIN C) tablet 500 mg  500 mg Oral BID Love, Pamela S, PA-C  500 mg at 08/23/21 0754  ? aspirin EC tablet 81 mg  81 mg Oral Daily Janine Ores, NP   81 mg at 08/23/21 0753  ? bisacodyl (DULCOLAX) suppository 10 mg  10 mg Rectal Daily Bary Leriche, PA-C   10 mg at 08/20/21 0818  ? bisacodyl (DULCOLAX) suppository 10 mg  10 mg Rectal Daily PRN Bary Leriche, PA-C   10 mg at 08/22/21 1841  ? Darbepoetin Alfa (ARANESP) injection 100 mcg  100 mcg Subcutaneous Q Tue-1800 Bary Leriche, PA-C   100 mcg at 08/16/21 1806  ? diphenhydrAMINE (BENADRYL) 12.5 MG/5ML elixir 12.5-25 mg  12.5-25 mg Oral  Q6H PRN Love, Pamela S, PA-C      ? enoxaparin (LOVENOX) injection 30 mg  30 mg Subcutaneous Daily Reome, Earle J, RPH   30 mg at 08/23/21 7106  ? feeding supplement (BOOST / RESOURCE BREEZE) liquid 1 Container  1 Container Oral TID WC Love, Ivan Anchors, PA-C   1 Container at 08/21/21 0900  ? folic acid (FOLVITE) tablet 1 mg  1 mg Oral Daily Bary Leriche, PA-C   1 mg at 08/23/21 2694  ? furosemide (LASIX) tablet 40 mg  40 mg Oral Daily Bary Leriche, PA-C   40 mg at 08/23/21 0753  ? guaiFENesin-dextromethorphan (ROBITUSSIN DM) 100-10 MG/5ML syrup 5-10 mL  5-10 mL Oral Q6H PRN Love, Pamela S, PA-C      ? hydrocerin (EUCERIN) cream   Topical BID Courtney Heys, MD   Given at 08/21/21 2049  ? ipratropium-albuterol (DUONEB) 0.5-2.5 (3) MG/3ML nebulizer solution 3 mL  3 mL Nebulization Q4H PRN Love, Ivan Anchors, PA-C   3 mL at 08/20/21 1459  ? lidocaine (LIDODERM) 5 % 2 patch  2 patch Transdermal Q24H Lovorn, Jinny Blossom, MD   2 patch at 08/22/21 1949  ? metoCLOPramide (REGLAN) tablet 5 mg  5 mg Oral BID Bary Leriche, PA-C   5 mg at 08/23/21 0753  ? mirtazapine (REMERON) tablet 15 mg  15 mg Oral QHS Angiulli, Lavon Paganini, PA-C      ? multivitamin with minerals tablet 1 tablet  1 tablet Oral Daily Bary Leriche, PA-C   1 tablet at 08/23/21 8546  ? nutrition supplement (JUVEN) (JUVEN) powder packet 1 packet  1 packet Oral BID BM Bary Leriche, PA-C   1 packet at 08/23/21 1114  ? oxyCODONE (Oxy IR/ROXICODONE) immediate release tablet 5-10 mg  5-10 mg Oral Q6H PRN Bary Leriche, PA-C   5 mg at 08/22/21 1058  ? pantoprazole (PROTONIX) EC tablet 40 mg  40 mg Oral Daily Bary Leriche, PA-C   40 mg at 08/23/21 0753  ? phenol (CHLORASEPTIC) mouth spray 1 spray  1 spray Mouth/Throat PRN Love, Pamela S, PA-C      ? polyethylene glycol (MIRALAX / GLYCOLAX) packet 17 g  17 g Oral Daily PRN Bary Leriche, PA-C   17 g at 08/21/21 1856  ? polyethylene glycol (MIRALAX / GLYCOLAX) packet 17 g  17 g Oral Daily Bary Leriche, PA-C   17 g at  08/23/21 0753  ? prochlorperazine (COMPAZINE) tablet 5-10 mg  5-10 mg Oral Q6H PRN Bary Leriche, PA-C      ? Or  ? prochlorperazine (COMPAZINE) injection 5-10 mg  5-10 mg Intramuscular Q6H PRN Love, Ivan Anchors, PA-C      ? Or  ? prochlorperazine (COMPAZINE) suppository 12.5 mg  12.5 mg Rectal Q6H PRN Bary Leriche, PA-C      ?  simethicone (MYLICON) chewable tablet 80 mg  80 mg Oral QID Charlett Blake, MD   80 mg at 08/23/21 1116  ? sodium phosphate (FLEET) 7-19 GM/118ML enema 1 enema  1 enema Rectal Once PRN Love, Pamela S, PA-C      ? thiamine tablet 100 mg  100 mg Oral Daily Bary Leriche, PA-C   100 mg at 08/23/21 0753  ? traZODone (DESYREL) tablet 25-50 mg  25-50 mg Oral QHS PRN Bary Leriche, PA-C   50 mg at 08/21/21 2048  ? zinc sulfate capsule 220 mg  220 mg Oral Daily Bary Leriche, PA-C   220 mg at 08/23/21 3748  ? ? ? ?Discharge Medications: ?Please see discharge summary for a list of discharge medications. ? ?Relevant Imaging Results: ? ?Relevant Lab Results: ? ? ?Additional Information ?OL#[id number] ? ?Rana Snare, LCSW ? ? ? ? ?

## 2021-08-23 NOTE — Progress Notes (Signed)
Physical Therapy Session Note ? ?Patient Details  ?Name: Edward Hopkins ?MRN: [id number] ?Date of Birth: 03/10/1951 ? ?Today's Date: 08/23/2021 ?PT Individual Time: 1034-1102 ?PT Individual Time Calculation (min): 28 min  ? ?Short Term Goals: ?Week 1:  PT Short Term Goal 1 (Week 1): Pt will complete least restrictive transfer with min A consistently ?PT Short Term Goal 1 - Progress (Week 1): Not met ?PT Short Term Goal 2 (Week 1): Pt will ambulate x 25 ft with LRAD and min A ?PT Short Term Goal 2 - Progress (Week 1): Not met ?PT Short Term Goal 3 (Week 1): Pt will initiate stair training as safe and able ?PT Short Term Goal 3 - Progress (Week 1): Not met ?Week 2:  PT Short Term Goal 1 (Week 2): =LTG due to ELOS ? ? ?Skilled Therapeutic Interventions/Progress Updates:  ?Patient supine in bed on entrance to room. Patient alert and agreeable to PT session.  ? ?Patient with no pain complaint throughout session. ? ?Therapeutic Activity: ?Bed Mobility: Patient performed supine --> sit with CGA. VC/ tc required for technique and to fully reach EOB. Return to supine with supervision. With vc, pt is able to push/ pull self up toward HOB with supervision.  ?Transfers: Patient performed sit<>stand transfers throughout session with CGA to RW from EOB. Good standing balance noted with Bil knee extension adequate. Able to scoot along EOB with CGA. Provided verbal cues for initiation, attn to task. Standing balance/ tolerance challenged with brief change in standing.  ? ?Patient supine  in bed at end of session with brakes locked, bed alarm set, and all needs within reach. Pt relating that he may be having an incontinent BM. NT notified.  ? ? ?Therapy Documentation ?Precautions:  ?Precautions ?Precautions: Fall ?Precaution Comments: mild R hemipareisis, 2L O2, slow to initiate ?Restrictions ?Weight Bearing Restrictions: No ?RLE Weight Bearing: Non weight bearing ?General: ?  ?Vital Signs: ?Therapy Vitals ?Temp: 98.6 ?F (37 ?C) ?Temp  Source: Oral ?Pulse Rate: 75 ?Resp: 18 ?BP: 116/66 ?Oxygen Therapy ?SpO2: 100 % ?O2 Device: Nasal Cannula ?O2 Flow Rate (L/min): 2 L/min ?Pain: ?  ?Mobility: ?  ?Locomotion : ?   ?Trunk/Postural Assessment : ?   ?Balance: ?  ?Exercises: ?  ?Other Treatments:   ? ? ? ?Therapy/Group: Individual Therapy ? ? A  ?08/23/2021, 8:32 AM  ?

## 2021-08-23 NOTE — Patient Care Conference (Signed)
Inpatient RehabilitationTeam Conference and Plan of Care Update ?Date: 08/23/2021   Time: 11:18 AM  ? ? ?Patient Name: Edward Hopkins      ?Medical Record Number: [id number]  ?Date of Birth: Jan 06, 1951 ?Sex: Male         ?Room/Bed: 5L93T/7S17B-93 ?Payor Info: Payor: [employer] / Plan: [facility] MEDICARE / Product Type: *No Product type* /   ? ?Admit Date/Time:  08/11/2021  5:23 PM ? ?Primary Diagnosis:  Intramural aortic hematoma (HCC) ? ?Hospital Problems: Principal Problem: ?  Intramural aortic hematoma ([place]) ?Active Problems: ?  Aortic aneurysm rupture ([place]) ?  Chronic kidney disease (CKD), stage IV (severe) (HCC) ?  Debility ?  Cerebral embolism with cerebral infarction ?  Goals of care, counseling/discussion ?  Palliative care by specialist ? ? ? ?Expected Discharge Date: Expected Discharge Date:  (SNF) ? ?Team Members Present: ?Physician leading conference: Dr. Courtney Heys ?Social Worker Present: [name], LCSWA ?Nurse Present: Dorthula Nettles, RN ?PT Present: Excell Seltzer, PT ?OT Present: Roanna Epley, Claris Gladden, OT ?PPS Coordinator present : Raechel Ache, OT ? ?   Current Status/Progress Goal Weekly Team Focus  ?Bowel/Bladder ? ? continent/incontinent  regain continence  toilet q 2hr and prn   ?Swallow/Nutrition/ Hydration ? ?           ?ADL's ? ? bed mobility-mod A; tranfsers mod A RW when last observed (decline in function and medical setbacks precluded transfers this reporting period; bathing at bed level-mod A; pt wearing hospital gowns  min A overall  activity tolerance, bed mobility, sitting balance, tranfsers   ?Mobility ? ? mod A bed mobility, have not observed transfers during this reporting period due to decline in function and medical setbacks  downgraded to min A overall  bed mobility, sitting balance and tolerance, transfers as safe and able   ?Communication ? ?           ?Safety/Cognition/ Behavioral Observations ?           ?Pain ? ? reports chest pain mostly muscular   < 3  assess pain q 4hr and prn   ?Skin ? ? incisions CDI  no new breakdown  assess skin q shift and prn   ? ? ?Discharge Planning:  ?D/c to home with s/o Edward Hopkins who can provide supervision. Pt has home health aide 7 dys per week/ 2hrs M-S; Sun 1.5hrs.   ?Team Discussion: ?Will need 2L, O2 at discharge. Positive for PE and can't anticoagulate. Patient will bleed. BP dropping. Sitting up causes dizziness. Incontinent B/B, reports chest pain, likely due to hematoma and PE. Has an aide 2hr. Mon-Sat., 1.5 hr. Sunday. Needs SNF placement. Family hasn't been for visit.  ? ?Patient on target to meet rehab goals: ?Min assist goals. Currently mod assist overall. Poor endurance and tolerance. ? ?*See Care Plan and progress notes for long and short-term goals.  ? ?Revisions to Treatment Plan:  ?Adjusting medications. ?  ?Teaching Needs: ?Family education, medication/pain management, safety awareness, transfer/gait training, etc. ?  ?Current Barriers to Discharge: ?Decreased caregiver support, Home enviroment access/layout, Incontinence, Lack of/limited family support, Insurance for SNF coverage, and Weight ? ?Possible Resolutions to Barriers: ?Family education ?SNF placement ?Follow up PT/OT ?Order recommended DME ?  ? ? Medical Summary ?Current Status: hasn't gotten OOB- feels dizzy/foggy when sits-incontinent of B/B- chronic chest pain; skin is ok so far ? Barriers to Discharge: Behavior;Decreased family/caregiver support;Home enviroment access/layout;Incontinence;Medical stability;New oxygen;Neurogenic Bowel & Bladder;Other (comments) ? Barriers to Discharge Comments: only  support- is Edward Hopkins- cam eup in transport w/c-pt max to total A orthostatic hypotension is main issue ?Possible Resolutions to [employer]: cannot get OOB due to endurance- 2L O2- cannot stop- goals were min A- but pt not progressing. - estranged from a lot of family- D/ SNF ? ? ?Continued Need for Acute Rehabilitation Level of Care: The patient  requires daily medical management by a physician with specialized training in physical medicine and rehabilitation for the following reasons: ?Direction of a multidisciplinary physical rehabilitation program to maximize functional independence : Yes ?Medical management of patient stability for increased activity during participation in an intensive rehabilitation regime.: Yes ?Analysis of laboratory values and/or radiology reports with any subsequent need for medication adjustment and/or medical intervention. : Yes ? ? ?I attest that I was present, lead the team conference, and concur with the assessment and plan of the team. ? ? ?Dorthula Nettles G ?08/23/2021, 3:36 PM  ? ? ? ? ? ? ?

## 2021-08-23 NOTE — Progress Notes (Signed)
Physical Therapy Session Note ? ?Patient Details  ?Name: Edward Hopkins ?MRN: [id number] ?Date of Birth: 18-Jun-1950 ? ?Today's Date: 08/23/2021 ?PT Individual Time: 5498-2641 ?PT Individual Time Calculation (min): 42 min  ? ?Short Term Goals: ?Week 2:  PT Short Term Goal 1 (Week 2): =LTG due to ELOS ? ?Skilled Therapeutic Interventions/Progress Updates:  ?  Pt received R sidelying in bed with NT present finishing dependent bed level peri-care due to bowel incontinence. With encouragement from familiar NT, pt agreeable to therapy session stating "I guess I can try to do something." Pt received and maintained on 2L of O2 via nasal cannula throughout session - SpO2 95% at beginning of session, not reassessed because no signs of SOB noticed and none reported. Pt likes to pick and joke for fun with therapist and pt states this makes him feel better because he has felt "depressed" lately. Therapist provided encouragement and motivation throughout session.  ? ?Supine>sitting R EOB, HOB slightly elevated and relying on bedrails, with min assist for trunk upright. Pt able to maintain sitting balance EOB ~30 seconds with close supervision due to tendency towards posterior lean then therapist provided pt RW to promote anterior trunk lean with UE support on AD. Pt continues to demo significant hip external rotation bilaterally resulting in his feet "crossing" underneath him in sitting - pt also tends to want to keep LEs extended out from underneath his BOS while sitting and therapist cued for correction to increase his safety while sitting. ? ?Sit>stand EOB>RW with CGA for safety - pt tends to push up with both hands on RW realying on UE support to come into standing. Pt requesting to get OOB to wheelchair. L stand pivot to reclining w/c using RW with CGA for safety/steadying. Therapist provided pt dependent wheelchair mobility to/from the gym for therapeutic change of scenery and discussed pt's success with OOB mobility  today. ? ?Transported back to room. R stand pivot w/c>EOB using RW with CGA for steadying while rising into stand then for turning as described above. Sit>supine with HOB slightly elevated and pt relying heavily on B UE support on bedrails to "spin" LEs into the bed. Pt reports feeling better after getting OOB today. Therapist turned on R&B music in pt's room per his request for improved mood. Pt left supine in bed with needs in reach, lines intact, and bed alarm on. ? ?Therapy Documentation ?Precautions:  ?Precautions ?Precautions: Fall ?Precaution Comments: mild R hemipareisis, 2L O2, slow to initiate ?Restrictions ?Weight Bearing Restrictions: No ?RLE Weight Bearing: Non weight bearing ? ? ?Pain: ?No complaints of pain throughout session. ? ? ? ?Therapy/Group: Individual Therapy ? ?Tawana Scale , PT, DPT, NCS, CSRS ?08/23/2021, 12:49 PM  ?

## 2021-08-23 NOTE — Progress Notes (Signed)
? ?                                                                                                                                                     ?                                                   ?Daily Progress Note  ? ?Patient Name: Edward Hopkins       Date: 08/23/2021 ?DOB: Nov 15, 1950  Age: 71 y.o. MRN#: [id number] ?Attending Physician: Courtney Heys, MD ?Primary Care Physician: Pcp, No ?Admit Date: 08/11/2021 ? ?Reason for Consultation/Follow-up: Establishing goals of care ? ?Patient Profile/HPI:   71 y.o. male  with past medical history of CHF, DVT, HTN, IVC filter, and polysubstance abuse admitted to [facility] 08/11/2021 following hospital admission for  ruptured type B aortic aneurysm secondary to hypertensive emergency requiring emergent repair and with subsequent respiratory failure, intubation and shock. Since Rehab admission, he was seen by Neurology for R sided weakness and found to have multifocal embolic appearing subcortical strokes in the L cerebellum and L frontal lobe, likely from dissection flap.  He also c/o back and chest pain and a V/Q scan was obtained that showed concern for acute PE in the RML. No anticoagulation has been recommended as would be very high risk. PMT consulted to discuss [place].  ? ?Subjective: ?Chart reviewed including labs, progress notes.  ?Devante awake, alert. Tells me his family visited him yesterday, his nephew gave him a haircut and his brother gave him a shave- he enjoyed this.  ?He does not like his diet. He doesn't want to eat. ?[place] reviewed- he wishes for full code, full scope care, however, if on life support would not want to be artificially prolonged if he were not going to recover. He continues to wish for [name] to be his HCPOA.  ?He tells me that social worker visited him and talked to him about nursing facility- he is agreeable to going to SNF.  ?Noted nurse note from yesterday that family requesting call from me to [facility]. With patient 's permission called  Joneen Boers and spoke with him and patient's family members who patient also gave permission for me to speak with. I explained my role as palliative medicine provider to discuss advanced care planning and goals of care with patient. Reviewed my conversation with him with them.  ?They had questions regarding his discharge plan- I relayed information that was in his chart, but also let them know that I am not involved in his discharge planning and information could change- currently, planned d/c date is 4/19, possibly to SNF. They inquired about at home PT- I reviewed most recent  SW note with them that patient has at home [facility] services, but that is not 24 hr care. Also discussed that recommendations for discharge will be based on his progress in therapies.  ? ?Review of Systems  ?Constitutional:  Positive for malaise/fatigue.  ?Respiratory:  Negative for shortness of breath.   ? ? ?Physical Exam ?Vitals and nursing note reviewed.  ?Constitutional:   ?   Comments: Appears frail  ?Neurological:  ?   Mental Status: He is alert and oriented to person, place, and time.  ?         ? ?Vital Signs: BP 128/73 (BP Location: Left Arm)   Pulse 66   Temp 97.7 ?F (36.5 ?C)   Resp 17   Ht 5\' 8"  (1.727 m)   Wt 82.9 kg   SpO2 99%   BMI 27.79 kg/m?  ?SpO2: SpO2: 99 % ?O2 Device: O2 Device: Room Air ?O2 Flow Rate: O2 Flow Rate (L/min): 2 L/min ? ?Intake/output summary:  ?Intake/Output Summary (Last 24 hours) at 08/23/2021 1458 ?Last data filed at 08/23/2021 1437 ?Gross per 24 hour  ?Intake 298 ml  ?Output 700 ml  ?Net -402 ml  ? ?LBM: Last BM Date : 08/23/21 ?Baseline Weight: Weight: 91.4 kg ?Most recent weight: Weight: 82.9 kg ? ?     ?Palliative Assessment/Data: PPS: 40% ? ? ? ?Flowsheet Rows   ? ?Flowsheet Row Most Recent Value  ?Intake Tab   ?Referral Department --  [CIR]  ?Unit at Time of Referral --  [CIR]  ?Palliative Care Primary Diagnosis --  [AAA]  ?Date Notified 08/16/21  ?Palliative Care Type New Palliative care  ?Reason for  referral Clarify Goals of Care  ?Date of Admission 08/11/21  ?Date first seen by Palliative Care 08/17/21  ?# of days Palliative referral response time 1 Day(s)  ?# of days IP prior to Palliative referral 5  ?Clinical Assessment   ?Psychosocial & Spiritual Assessment   ?Palliative Care Outcomes   ? ?  ? ? ?Patient Active Problem List  ? Diagnosis Date Noted  ? Goals of care, counseling/discussion   ? Palliative care by specialist   ? Cerebral embolism with cerebral infarction 08/16/2021  ? Aortic aneurysm rupture ([place]) 08/11/2021  ? Hypertensive emergency 08/11/2021  ? Acute respiratory failure with hypoxia ([place]) 08/11/2021  ? Ileus ([place]) 08/11/2021  ? Hemorrhagic shock ([place]) 08/11/2021  ? Thrombocytopenia ([place]) 08/11/2021  ? Chronic kidney disease (CKD), stage IV (severe) ([place]) 08/11/2021  ? Debility 08/11/2021  ? Cocaine abuse ([place])   ? Shock ([place])   ? Dissection of aorta ([place]) 07/27/2021  ? Hypercoagulable state ([place]) 10/21/2015  ? Pulmonary embolism ([place]) 10/21/2015  ? S/P ascending aortic replacement 08/27/2014  ? Acute kidney injury ([place]) 08/06/2014  ? Malnutrition of moderate degree ([place]) 08/05/2014  ? Orthostatic hypotension 08/04/2014  ? Hypotension 08/04/2014  ? Femoral DVT (deep venous thrombosis) (HCC)   ? Intramural aortic hematoma ([place]) 07/17/2014  ? ? ?Palliative Care Assessment & Plan  ? ? ?Assessment/Recommendations/Plan ? ?Continue current plan of care- full scope, full code status ?PMT will follow peripherally, please call if further assistance is needed ? ? ?Code Status: ?Full code ? ?Prognosis: ? Unable to determine ? ?Discharge Planning: ?To Be Determined ? ?Care plan was discussed with patient and his family members ? ?Total time: 80 minutes ? ?Thank you for allowing the Palliative Medicine Team to assist in the care of this patient. ? ?Total time:  ?Prolonged billing:  ? ?   ?Greater than  50%  of this time was spent counseling and coordinating care related to the above assessment and  plan. ? ?Mariana Kaufman, AGNP-C ?Palliative Medicine ? ? ?Please contact Palliative Medicine Team phone at [phone] for questions and concerns.  ? ? ? ? ? ? ?

## 2021-08-23 NOTE — Progress Notes (Signed)
Patient ID: Edward Hopkins, male   DOB: 02-07-1951, 71 y.o.   MRN: [id number] ? ?SW met with pt in room to provide updates from team conference, and d.c recommendation of SNF due to continued physical assistance required at time of discharge that his partner is not able to provide. Pt is amenable to short term SNF and would like to do anything he can so he can get home. SW made attempt to contact s/o Edward Hopkins while in room but no answer at either number listed, SW left message.  ? ?SW sent out SNF referral. SW provided pt with SNF list based on insurance plan.  ? ?1540- SW made second attempt to contact Edward Hopkins on home and cell phone listed in [place]. No answer, left message.  ? ?[name], MSW, LCSWA ?Office: [phone] ?Cell: [phone] ?Fax: [phone]  ? ? ? ?

## 2021-08-24 NOTE — Progress Notes (Signed)
Occupational Therapy Session Note ? ?Patient Details  ?Name: Edward Hopkins ?MRN: [id number] ?Date of Birth: Dec 28, 1950 ? ?Today's Date: 08/24/2021 ?OT Co-Treatment Time: 9507-2257 ?OT Co-Treatment Time Calculation (min): 30 min ?Total time 1300-1405 (65 mins) ? ?Short Term Goals: ?Week 1:  OT Short Term Goal 1 (Week 1): Pt will be able to sit to EOB with min A within 2 minutes demonstrating improved initiation and motor skills. ?OT Short Term Goal 1 - Progress (Week 1): Progressing toward goal ?OT Short Term Goal 2 (Week 1): sitting EOB, pt will don tshirt with min A. ?OT Short Term Goal 2 - Progress (Week 1): Progressing toward goal ?OT Short Term Goal 3 (Week 1): Pt will be able to stand with min A to RW within 2 minutes to prep for LB dressing. ?OT Short Term Goal 3 - Progress (Week 1): Progressing toward goal ?OT Short Term Goal 4 (Week 1): Pt will be able to transfer to [facility] with mod A. ?OT Short Term Goal 4 - Progress (Week 1): Progressing toward goal ? ?Skilled Therapeutic Interventions/Progress Updates:  ?   Cotreatment session with PT with focus on bed mobility, transfers, OOB tolerance, and short distance gait in functional environment. Pt received seated in bed, agreeable to therapy. Pt on 2L O2 via [place] throughout session, SpO2 drops to 87% with activity at times but returns to 90% (+) with seated rest breaks. Supine to sit with CGA with HOB slightly elevated and use of bedrail. Pt reports some dizziness upon initially sitting that subsides with seated rest break. Assisted pt with donning pants while seated EOB. Sit to stand with min A to RW during session, stand pivot transfer to w/c with RW and min A. Pt taken outdoors via w/c for improved mood and therapy buy-in. Pt is able to stand to RW with min A from w/c. Amb with RW at min A approx 10'. Pt admitted that he needed to rest and sat back into w/c. Pt with realization that he thought he could do more then he actually can and fatigues quickly. Pt happy to  have time outside and in generally uplifting spirits. Pt returned to room and reamined in w/c with PT present to complete transfer back to bed. ? ?Therapy Documentation ?Precautions:  ?Precautions ?Precautions: Fall ?Precaution Comments: mild R hemipareisis, 2L O2, slow to initiate ?Restrictions ?Weight Bearing Restrictions: No ?RLE Weight Bearing: Non weight bearing ?General: ?  ?Vital Signs: ? ?Pain: ? Pt reports some abdominal pain; repositioned ? ? ?Therapy/Group: Individual Therapy ? ?[name] ?08/24/2021, 2:46 PM ?

## 2021-08-24 NOTE — Progress Notes (Signed)
Physical Therapy Session Note ? ?Patient Details  ?Name: Edward Hopkins ?MRN: [id number] ?Date of Birth: 09/10/1950 ? ?Today's Date: 08/24/2021 ?PT Co-Treatment Time: 5465-0354 ?PT Co-Treatment Time Calculation (min): 35 min ? ?Short Term Goals: ?Week 2:  PT Short Term Goal 1 (Week 2): =LTG due to ELOS ? ?Skilled Therapeutic Interventions/Progress Updates:  ?  Cotreatment session with OTA with focus on bed mobility, transfers, OOB tolerance, and short distance gait in functional environment. Pt received seated in bed, agreeable to PT session. Pt on 2L O2 via [place] throughout session, SpO2 drops to 87% with activity at times but returns to 90% (+) with seated rest breaks. Supine to sit with CGA with HOB slightly elevated and use of bedrail. Pt reports some dizziness upon initially sitting that subsides with seated rest break. Assisted pt with donning pants while seated EOB. Sit to stand with min A to RW during session, stand pivot transfer to w/c with RW and min A. Pt taken outdoors via w/c for improved mood and therapy buy-in. Pt is able to stand to RW with min A from w/c. Ambulation x 10 ft with RW and min A for balance with close w/c follow for safety. During gait pt states, "I bit off more than I can chew" and requests to return to sitting. SpO2 95% following gait while on 2L O2. Pt also reports a change in his "equilibrium" in standing but unable to further clarify symptoms. Pt reports improvement in symptoms with seated rest break. Upon return to pt's room he requests to transfer back to bed. Stand pivot transfer w/c to bed with RW and min A. Sit to supine Supervision. Pt able to reposition himself in bed with cues for sequencing and use of bedrails. Assisted pt with attempting to call various family members, unable to reach them. Pt left seated in bed with needs in reach, bed alarm in place. ? ?Therapy Documentation ?Precautions:  ?Precautions ?Precautions: Fall ?Precaution Comments: mild R hemipareisis, 2L O2, slow  to initiate ?Restrictions ?Weight Bearing Restrictions: No ?RLE Weight Bearing: Non weight bearing ? ? ? ? ? ? ?Therapy/Group: Co-Treatment ? ? ?Excell Seltzer, PT, DPT, CSRS ? ?08/24/2021, 2:19 PM  ?

## 2021-08-24 NOTE — Progress Notes (Signed)
Patient continues to have a poor PO intake. Patient offered snacks and fluids. Patient does drink 100 % of boost. Family at bed side, family encouraged patient to eat also. Call light within in reach, personal items within reach.  ?

## 2021-08-24 NOTE — Progress Notes (Signed)
Physical Therapy Session Note ? ?Patient Details  ?Name: Edward Hopkins ?MRN: [id number] ?Date of Birth: 10-20-1950 ? ?Today's Date: 08/24/2021 ?PT Individual Time: 8676-1950 ?PT Individual Time Calculation (min): 54 min  ? ?Short Term Goals: ?Week 2:  PT Short Term Goal 1 (Week 2): =LTG due to ELOS ? ?Skilled Therapeutic Interventions/Progress Updates:  ?BED MOBILITY: ?Pt moved supine to sitting EOB with min assist with HOB 30 degrees, rail used and cues on technique. Assistance needed for trunk elevation into sitting position with pt self moving LE's over to/off edge of bed. At end of session min assist for LE's to clear bed surface with lying back down.  ? ?TRANSFERS: ?Min assist for sit<>stands in session from low bed/mat table/wheelchair to RW with cues each time for hand placement and weight shifting. Once standing min guard assist for stand pivot transfers with RW bed<>wheelchair and mat<>wheelchair. Pt transported room<>main gym via wheelchair.  ? ?STRENGTHENING/NMR: ?Seated at edge of mat table working on LE strengthening in unsupported sitting: No weights for long arc quads for ~5 reps each side. Attempted 2.5# weights on bil LE's, removed from right LE with no weight reapplied for heel<>toe raises with cues for technique and tactile cues to back of right thigh for heel lifting for 8-10 reps each side. Pt with tendency to lean back with arms behind him on mat. When not engaged in LE ex's pt with feet crossed in flexion underneath him vs LE's extended out with heels only resting on floor. Pt able to correct posture with cues, unable to sustain very long. Pt fatigued with mat activity and requesting back to bed at end of session.  ? ?Therapy Documentation ?Precautions:  ?Precautions ?Precautions: Fall ?Precaution Comments: mild R hemipareisis, 2L O2, slow to initiate ?Restrictions ?Weight Bearing Restrictions: No ?RLE Weight Bearing: Non weight bearing ? ? ?Vital Signs: BP 97/64 (75), HR 73 before session. SaO2  >/= 98% throughout session on 2 lpm via [place] ? ?Pain: ?Pain Assessment ?Pain Scale: 0-10 ?Pain Score: 0-No pain ?Faces Pain Scale: No hurt ? ? ? ? ? ?Therapy/Group: Individual Therapy ? ?Willow Ora, PTA, CLT ?08/24/21, 10:39 AM   ?

## 2021-08-24 NOTE — Progress Notes (Signed)
?                                                       PROGRESS NOTE ? ? ?Subjective/Complaints: ?No new complaints this morning ?Somnolent ?Hgb improved yesterday ?Tolerated therapy well today ? ? ?ROS- ? ?Pt denies SOB, (+) abd pain, CP, N/V/C/D, and vision changes ? ? ?Objective: ?  ?No results found. ?Recent Labs  ?  08/22/21 ?0642 08/23/21 ?4656  ?WBC 7.6 6.3  ?HGB 7.6* 9.3*  ?HCT 23.4* 29.1*  ?PLT 456* 261  ? ?Recent Labs  ?  08/22/21 ?0642 08/23/21 ?8127  ?NA 132* 132*  ?K 3.9 4.4  ?CL 104 96*  ?CO2 23 29  ?GLUCOSE 91 98  ?BUN 10 40*  ?CREATININE 1.18 2.49*  ?CALCIUM 7.5* 8.8*  ? ? ? ?Intake/Output Summary (Last 24 hours) at 08/24/2021 1337 ?Last data filed at 08/24/2021 0500 ?Gross per 24 hour  ?Intake 0 ml  ?Output 300 ml  ?Net -300 ml  ?  ? ?  ? ?Physical Exam: ?Vital Signs ?Blood pressure (!) 108/54, pulse 70, temperature 98.8 ?F (37.1 ?C), resp. rate 16, height 5\' 8"  (1.727 m), weight 78.8 kg, SpO2 98 %. ?Gen: no distress, normal appearing ?HEENT: oral mucosa pink and moist, NCAT ?Cardio: Reg rate ?Chest: normal effort, normal rate of breathing ?Abd: soft, non-distended ?Ext: no edema ?Psychiatric: appropriate- flat ?Neurological: vague- alert- asking to go outside ? Skin: RUE less swelling trace to 1+- hand - esp hand better, but possible tissue necrosis- looks more erythematous- not raised- much better- swelling resolved of RUE ?LUE 5/5 ?RUE 4+/5 delt bi, tri grip ?LLE- 5/5 ?RLE- 5-/5- weaker on R side  ?Ext  ?2+ LE edema to mid calf and 1-2+ RUE swelling  ?Neurological:  ?   Mental Status: He is oriented to person, place, and time.  ?   Comments: Left facial weakness. Lethargic and tended to drift off. Internally distracted and needed cues to follow simple motor commands. He was able to use calender independently to recall date. Able to recall age and DOB.   ?Sleepy, but Ox3- knew at [facility] cone and why; and month/year ?Sensation intact in all 4 extremities  ?Assessment/Plan: ?1. Functional deficits  which require 3+ hours per day of interdisciplinary therapy in a comprehensive inpatient rehab setting. ?Physiatrist is providing close team supervision and 24 hour management of active medical problems listed below. ?Physiatrist and rehab team continue to assess barriers to discharge/monitor patient progress toward functional and medical goals ? ?Care Tool: ? ?Bathing ?   ?Body parts bathed by patient: Chest, Abdomen  ? Body parts bathed by helper: Buttocks, Right upper leg, Left upper leg, Left lower leg, Right lower leg ?  ?  ?Bathing assist Assist Level: Moderate Assistance - Patient 50 - 74% ?  ?  ?Upper Body Dressing/Undressing ?Upper body dressing   ?What is the patient wearing?: Pull over shirt ?   ?Upper body assist Assist Level: Dependent - Patient 0% ?   ?Lower Body Dressing/Undressing ?Lower body dressing ? ? ?   ?What is the patient wearing?: Incontinence brief ? ?  ? ?Lower body assist Assist for lower body dressing: Minimal Assistance - Patient > 75% ?   ? ?Toileting ?Toileting    ?Toileting assist Assist for toileting: 2 Helpers ?  ?  ?Transfers ?  Chair/bed transfer ? ?Transfers assist ?   ? ?Chair/bed transfer assist level: Contact Guard/Touching assist (stand pivot) ?Chair/bed transfer assistive device: Walker ?  ?Locomotion ?Ambulation ? ? ?Ambulation assist ? ? Ambulation activity did not occur: Safety/medical concerns ? ?  ?  ?   ? ?Walk 10 feet activity ? ? ?Assist ? Walk 10 feet activity did not occur: Safety/medical concerns ? ?  ?   ? ?Walk 50 feet activity ? ? ?Assist Walk 50 feet with 2 turns activity did not occur: Safety/medical concerns ? ?  ?   ? ? ?Walk 150 feet activity ? ? ?Assist Walk 150 feet activity did not occur: Safety/medical concerns ? ?  ?  ?  ? ?Walk 10 feet on uneven surface  ?activity ? ? ?Assist Walk 10 feet on uneven surfaces activity did not occur: Safety/medical concerns ? ? ?  ?   ? ?Wheelchair ? ? ? ? ?Assist Is the patient using a wheelchair?: Yes ?Type of  Wheelchair: Manual ?  ? ?Wheelchair assist level: Dependent - Patient 0% ?   ? ? ?Wheelchair 50 feet with 2 turns activity ? ? ? ?Assist ? ?  ?Wheelchair 50 feet with 2 turns activity did not occur: Safety/medical concerns ? ? ?   ? ?Wheelchair 150 feet activity  ? ? ? ?Assist ? Wheelchair 150 feet activity did not occur: Safety/medical concerns ? ? ?   ? ?Blood pressure (!) 108/54, pulse 70, temperature 98.8 ?F (37.1 ?C), resp. rate 16, height 5\' 8"  (1.727 m), weight 78.8 kg, SpO2 98 %. ? ?Medical Problem List and Plan: ?1. Functional deficits secondary to debility from  thoracic dissection of aneurysm  ?            -patient may  shower ?            -ELOS/Goals: 12-14 days min A to supervision ? D/c date 4/19 ? Continue CIR- PT, OT and SLP ?2.  Antithrombotics: ?-DVT/anticoagulation:  Pharmaceutical: Lovenox. Will order dopplers of RUE.  ?            -antiplatelet therapy: N/A ?3. Pain Management:  oxycodone prn  ? 4/4- having a lot of lower chest and back pain- con't pain meds- will add lidocaine patches for night time for back pain.  ? 4/11- pain usually controlled with meds ?4. Mood: LCSW to follow for evaluation and support.  ?            -antipsychotic agents: N/A ?5. Neuropsych: This patient may be  capable of making decisions on his own behalf. ?6. Skin/Wound Care: Routine pressure relief measures.  Monitor areas on right forearm/wrist for demarcation ?            -will add Vitamin C and Zinc to promote healing.  ?7. Fluids/Electrolytes/Nutrition: Monitor I/O. Check lytes in am.  ?8. Ruptured  descending thoracic aneurysm s/p repair: BP management.  ?            --will need repeat CTA 4 weeks (monitoring SCr  for improvement) ?9. Acute on chronic renal failure: with new CKD Stage IV Improvement in BUN/SCr- 39/2.38 ?            --monitor with serial checks. Avoid nephrotoxic medications.  ? 3/31- Cr and BUN stable con't to monitor weekly.  ? 4/3- Cr still 2.3-2.4- is stable- per Vascular wants pt kidney  function to improve some, before CTA< however need to check for PE/so will do both ? 4/6- labs in  AM ? 4/7- Cr 2.46- very slightly increased- will con't regimen ? 4/11- Cr 2.49 and BUN 40-  ?10. Acute blood loss anemia: Hgb reviewed and improving, monitor weekly.  ?11. Hypoxia: Oxygen weaned.  ?            --Off diuresis. Resp rate increase with minimal movement- will monitor O2 sats.  ? 3/31- monitor closely, since  SOB with minimal exertion. ?4/3- still requiring O2- will check for PE this evening- since pt's still has DOE.  ?4/4- Couldn't do CTA- so ordered VQ scabn- pending.   ?4/5- has PE- but no DVT- has filter from 1995- d/w pulmonary- risk of AC is worse than no AC- will hold off- due to high risk of bleeding profusely per vascular-  ?Asking palliative care to help with goals.  ? 4/6- pt wants to be full code right now palliative care ot see again today- family coming today.  ?4/7- O2 sats lower this AM- on RA- needed to be put back on O2 ?4/10- does well when wears O2.  ?12. ABLA: Improving with Epogen and thrombocytopenia has resolved.   ? 3/31- Hb 8.7- stable to slightly down- was 9.1 a few days ago- ? 4/3- Hb 8.5- overall stable ? ?4/11- Hb 9.3- improving.  ?13. Polysubstance abuse: Educated on importance of cessation ?            --smokes > 1 PPD and marijuana/cocaine-->Has planned to quit.  ?14. Obesity: BMI 30.- initially documented as BMI of 68- was wrong- Educate on diet and importance of weight loss to help promote mobility and overall health.  ?15. Ileus: Has been refusing daily suppository--last BM on 03/25.  ?--Intake poor--0-50% in the past 24 hours with abdominal distension. ?--Will increase Miralax to bid.Check KUB for follow up.  ?3/31- KUB looks better, but still distended loops of bowel- not eating much- will start Reglan 5 mg TID for the moment and see if things improve-KUB improving today , add dulc supp, mylicon ?4/10- BM Friday- needs more sorbitol and Fleet's enema ?4/11- large BM  this AM- but feels like still needs to go- will try to use smaller dose of sorbitol if needed next time.  ?16. R hemiparesis with R facial droop- head CT already done- will assess if can get brain MRI ? 3/31- will ask

## 2021-08-25 MED ORDER — MELATONIN 3 MG PO TABS
3.0000 mg | ORAL_TABLET | Freq: Every day | ORAL | Status: DC
  Administered 2021-08-25 – 2021-09-06 (×13): 3 mg via ORAL
  Filled 2021-08-25 (×13): qty 1

## 2021-08-25 MED ORDER — ACETAMINOPHEN 325 MG PO TABS: 325.0000 mg | ORAL_TABLET | ORAL | Status: DC | PRN

## 2021-08-25 NOTE — Progress Notes (Signed)
Occupational Therapy Note ? ?Patient Details  ?Name: Edward Hopkins ?MRN: [id number] ?Date of Birth: 1951/04/14 ? ?Today's Date: 08/25/2021 ?OT Missed Time: 60 Minutes ?Missed Time Reason: Patient fatigue ? ?Pt sleeping in bed upon arrival with BLE off EOB. When aroused, pt attempted to roll over with BLE off EOB. Assistance provided to prevent pt from rolling off EOB. Pt very groggy and stated he had a rough night and needed to rest. Pt unable to participate. Pt missed 60 mins skilled OT services. Will check back as time allows.   ? ?[name] ?08/25/2021, 1:16 PM ?

## 2021-08-25 NOTE — Progress Notes (Signed)
?                                                       PROGRESS NOTE ? ? ?Subjective/Complaints: ?Sleepy this morning and said he had a rough night last night. Melatonin ordered for tonight.  ? ? ?ROS- ? ?Pt denies SOB, (+) abd pain, CP, N/V/C/D, and vision changes, +insomnia ? ? ?Objective: ?  ?No results found. ?Recent Labs  ?  08/23/21 ?1219  ?WBC 6.3  ?HGB 9.3*  ?HCT 29.1*  ?PLT 261  ? ?Recent Labs  ?  08/23/21 ?7588  ?NA 132*  ?K 4.4  ?CL 96*  ?CO2 29  ?GLUCOSE 98  ?BUN 40*  ?CREATININE 2.49*  ?CALCIUM 8.8*  ? ? ? ?Intake/Output Summary (Last 24 hours) at 08/25/2021 1329 ?Last data filed at 08/24/2021 1858 ?Gross per 24 hour  ?Intake 220 ml  ?Output 600 ml  ?Net -380 ml  ?  ? ?  ? ?Physical Exam: ?Vital Signs ?Blood pressure 112/60, pulse 75, temperature 98.2 ?F (36.8 ?C), resp. rate 18, height 5\' 8"  (1.727 m), weight 78.8 kg, SpO2 98 %. ?Gen: no distress, normal appearing, BMI 26.43 ?HEENT: oral mucosa pink and moist, NCAT ?Cardio: Reg rate ?Chest: normal effort, normal rate of breathing ?Abd: soft, non-distended ?Ext: no edema ?Psychiatric: appropriate- flat ?Neurological: vague- alert- asking to go outside ? Skin: RUE less swelling trace to 1+- hand - esp hand better, but possible tissue necrosis- looks more erythematous- not raised- much better- swelling resolved of RUE ?LUE 5/5 ?RUE 4+/5 delt bi, tri grip ?LLE- 5/5 ?RLE- 5-/5- weaker on R side  ?Ext  ?2+ LE edema to mid calf and 1-2+ RUE swelling  ?Neurological:  ?   Mental Status: He is oriented to person, place, and time.  ?   Comments: Left facial weakness. Lethargic and tended to drift off. Internally distracted and needed cues to follow simple motor commands. He was able to use calender independently to recall date. Able to recall age and DOB.   ?Sleepy, but Ox3- knew at [facility] cone and why; and month/year ?Sensation intact in all 4 extremities  ?Assessment/Plan: ?1. Functional deficits which require 3+ hours per day of interdisciplinary therapy in a  comprehensive inpatient rehab setting. ?Physiatrist is providing close team supervision and 24 hour management of active medical problems listed below. ?Physiatrist and rehab team continue to assess barriers to discharge/monitor patient progress toward functional and medical goals ? ?Care Tool: ? ?Bathing ?   ?Body parts bathed by patient: Chest, Abdomen  ? Body parts bathed by helper: Buttocks, Right upper leg, Left upper leg, Left lower leg, Right lower leg ?  ?  ?Bathing assist Assist Level: Moderate Assistance - Patient 50 - 74% ?  ?  ?Upper Body Dressing/Undressing ?Upper body dressing   ?What is the patient wearing?: Pull over shirt ?   ?Upper body assist Assist Level: Dependent - Patient 0% ?   ?Lower Body Dressing/Undressing ?Lower body dressing ? ? ?   ?What is the patient wearing?: Incontinence brief ? ?  ? ?Lower body assist Assist for lower body dressing: Minimal Assistance - Patient > 75% ?   ? ?Toileting ?Toileting    ?Toileting assist Assist for toileting: 2 Helpers ?  ?  ?Transfers ?Chair/bed transfer ? ?Transfers assist ?   ? ?Chair/bed  transfer assist level: Minimal Assistance - Patient > 75% ?Chair/bed transfer assistive device: Walker ?  ?Locomotion ?Ambulation ? ? ?Ambulation assist ? ? Ambulation activity did not occur: Safety/medical concerns ? ?Assist level: Minimal Assistance - Patient > 75% ?Assistive device: Walker-rolling ?Max distance: 68'  ? ?Walk 10 feet activity ? ? ?Assist ? Walk 10 feet activity did not occur: Safety/medical concerns ? ?Assist level: Minimal Assistance - Patient > 75% ?Assistive device: Walker-rolling  ? ?Walk 50 feet activity ? ? ?Assist Walk 50 feet with 2 turns activity did not occur: Safety/medical concerns ? ?  ?   ? ? ?Walk 150 feet activity ? ? ?Assist Walk 150 feet activity did not occur: Safety/medical concerns ? ?  ?  ?  ? ?Walk 10 feet on uneven surface  ?activity ? ? ?Assist Walk 10 feet on uneven surfaces activity did not occur: Safety/medical  concerns ? ? ?  ?   ? ?Wheelchair ? ? ? ? ?Assist Is the patient using a wheelchair?: Yes ?Type of Wheelchair: Manual ?  ? ?Wheelchair assist level: Dependent - Patient 0% ?   ? ? ?Wheelchair 50 feet with 2 turns activity ? ? ? ?Assist ? ?  ?Wheelchair 50 feet with 2 turns activity did not occur: Safety/medical concerns ? ? ?   ? ?Wheelchair 150 feet activity  ? ? ? ?Assist ? Wheelchair 150 feet activity did not occur: Safety/medical concerns ? ? ?   ? ?Blood pressure 112/60, pulse 75, temperature 98.2 ?F (36.8 ?C), resp. rate 18, height 5\' 8"  (1.727 m), weight 78.8 kg, SpO2 98 %. ? ?Medical Problem List and Plan: ?1. Functional deficits secondary to debility from  thoracic dissection of aneurysm  ?            -patient may  shower ?            -ELOS/Goals: 12-14 days min A to supervision ? D/c date 4/19 ? Continue CIR- PT, OT and SLP ?2.  Antithrombotics: ?-DVT/anticoagulation:  Pharmaceutical: Lovenox. Will order dopplers of RUE.  ?            -antiplatelet therapy: N/A ?3. Pain: continue oxycodone prn  ? 4/4- having a lot of lower chest and back pain- con't pain meds- will add lidocaine patches for night time for back pain.  ? 4/11- pain usually controlled with meds ?4. Mood: LCSW to follow for evaluation and support.  ?            -antipsychotic agents: N/A ?5. Neuropsych: This patient may be  capable of making decisions on his own behalf. ?6. Skin/Wound Care: Routine pressure relief measures.  Monitor areas on right forearm/wrist for demarcation ?            -will add Vitamin C and Zinc to promote healing.  ?7. Fluids/Electrolytes/Nutrition: Monitor I/O. Check lytes in am.  ?8. Ruptured  descending thoracic aneurysm s/p repair: BP management.  ?            --will need repeat CTA 4 weeks (monitoring SCr  for improvement) ?9. Acute on chronic renal failure: with new CKD Stage IV Improvement in BUN/SCr- 39/2.38 ?            --monitor with serial checks. Avoid nephrotoxic medications.  ? 3/31- Cr and BUN stable  con't to monitor weekly.  ? 4/3- Cr still 2.3-2.4- is stable- per Vascular wants pt kidney function to improve some, before CTA< however need to check for PE/so will do both ?  4/6- labs in AM ? 4/7- Cr 2.46- very slightly increased- will con't regimen ? Cr 2.49 and BUN 40 on 4/11, continue to monitor ?10. Acute blood loss anemia: Hgb reviewed and improving, monitor weekly.  ?11. Hypoxia: Oxygen weaned.  ?            --Off diuresis. Resp rate increase with minimal movement- will monitor O2 sats.  ? 3/31- monitor closely, since  SOB with minimal exertion. ?4/3- still requiring O2- will check for PE this evening- since pt's still has DOE.  ?4/4- Couldn't do CTA- so ordered VQ scabn- pending.   ?4/5- has PE- but no DVT- has filter from 1995- d/w pulmonary- risk of AC is worse than no AC- will hold off- due to high risk of bleeding profusely per vascular-  ?Asking palliative care to help with goals.  ? 4/6- pt wants to be full code right now palliative care ot see again today- family coming today.  ?4/7- O2 sats lower this AM- on RA- needed to be put back on O2 ?4/10- does well when wears O2.  ?12. ABLA: Improving with Epogen and thrombocytopenia has resolved.  Continue to monitor as needed.  ?13. Polysubstance abuse: Educated on importance of cessation ?            --smokes > 1 PPD and marijuana/cocaine-->Has planned to quit.  ?14. Obesity: BMI 30.- initially documented as BMI of 68- was wrong- Educate on diet and importance of weight loss to help promote mobility and overall health. Now BMI down to 26.43. ?15. Ileus: Has been refusing daily suppository--last BM on 03/25.  ?--Intake poor--0-50% in the past 24 hours with abdominal distension. ?--Will increase Miralax to bid.Check KUB for follow up.  ?3/31- KUB looks better, but still distended loops of bowel- not eating much- will start Reglan 5 mg TID for the moment and see if things improve-KUB improving today , add dulc supp, mylicon ?4/10- BM Friday- needs more  sorbitol and Fleet's enema ?4/11- large BM this AM- but feels like still needs to go- will try to use smaller dose of sorbitol if needed next time.  ?16. R hemiparesis with R facial droop- head CT already done- will ass

## 2021-08-25 NOTE — Progress Notes (Signed)
Physical Therapy Session Note ? ?Patient Details  ?Name: Edward Hopkins ?MRN: [id number] ?Date of Birth: [birth year] ? ?Today's Date: 08/25/2021 ?PT Individual Time: 2248-2500; 1000-1030 ?PT Individual Time Calculation (min): 30 min and 30 min ?PT Amount of Missed Time (min): 15 Minutes; 30 min ?PT Missed Treatment Reason: Patient fatigue; Patient fatigue ? ?Short Term Goals: ?Week 2:  PT Short Term Goal 1 (Week 2): =LTG due to ELOS ? ?Skilled Therapeutic Interventions/Progress Updates:  ?  Session 1: ?Pt received seated in bed asleep, arousable and agreeable to PT session. Pt reports ongoing chest pain, nursing work on pain medication for patient this AM. Pt on 2L O2 via [place], SpO2 remains at 93% (+) during session even with activity. Supine to sit with min A for trunk elevation, HOB elevated and use of bedrail. Pt again initially dizzy upon sitting up but resolves with seated rest break. Educated pt on [place] and importance of spending time sitting up OOB. Pt agreeable to transfer to w/c. Sit to stand with CGA to RW. Stand pivot transfer bed to w/c with RW and CGA. Pt agreeable to remain seated until next scheduled therapy session this AM. Pt left seated in high-back reclining w/c with needs in reach, quick release belt and chair alarm in place. NT notified of pt's location and that pt declining to eat breakfast at this time. Pt missed 15 min of scheduled therapy session due to fatigue. ? ?Session 2: ?Pt received seated in high-back w/c in room, reports whole body soreness but especially in his back from sitting up x 1 hour 15 min. Pt reports he has been premedicated for pain prior to start of session. Pt requesting to return to bed due to pain and fatigue. Sit to stand with min A to RW, stand pivot transfer to bed with RW and min A. Pt requires increased time and assist this session due to pain and fatigue. Sit to supine mod A needed for BLE management due to fatigue. Pt left in L sidelying in bed with needs in reach, bed  alarm in place. Obtained TIS chair for further attempts at OOB tolerance for improved pt comfort. Pt on 2L O2 via [place] throughout session, SpO2 drops to 87% with transfer but returns to 90% (+) with rest break by end of session. Pt missed 30 min of scheduled therapy session due to fatigue this AM. ? ?Therapy Documentation ?Precautions:  ?Precautions ?Precautions: Fall ?Precaution Comments: mild R hemipareisis, 2L O2, slow to initiate ?Restrictions ?Weight Bearing Restrictions: No ? ? ? ? ? ?Therapy/Group: Individual Therapy ? ? ?Excell Seltzer, PT, DPT, CSRS ?08/25/2021, 8:37 AM  ?

## 2021-08-26 NOTE — Progress Notes (Signed)
Occupational Therapy Weekly Progress Note ? ?Patient Details  ?Name: Edward Hopkins ?MRN: [id number] ?Date of Birth: October 28, 1950 ? ?Beginning of progress report period: August 18, 2021 ?End of progress report period: August 26, 2021 ? ? ?Patient has met 3 of 4 short term goals.  Pt improving with transitional movements, sitting balance, standing balance, functional transfers, and BADLs over the past week. Supine<>sit EOB with min A/mod A. Sit<>stand with min A and stand pivot transfers with min A. Pt declines donning clothing and prefers hospital gown. Bathing with mod A seated EOB. Pt tolerating sitting up in w/c approx 1.5-2 hours/day. Sitting balance EOB with min A. Pt fatigues quickly, requiring multiple extended rest breaks during sessions.  ? ?Patient continues to demonstrate the following deficits: muscle weakness and muscle joint tightness, decreased cardiorespiratoy endurance and decreased oxygen support, and decreased standing balance, decreased postural control, and decreased balance strategies and therefore will continue to benefit from skilled OT intervention to enhance overall performance with BADL. ? ?Patient progressing toward long term goals..  Continue plan of care. ? ?OT Short Term Goals ?Week 2:  OT Short Term Goal 1 (Week 2): Pt will be able to sit to EOB with min A within 2 minutes demonstrating improved initiation and motor skills. ?OT Short Term Goal 1 - Progress (Week 2): Met ?OT Short Term Goal 2 (Week 2): sitting EOB, pt will don tshirt with min A. ?OT Short Term Goal 2 - Progress (Week 2): Progressing toward goal ?OT Short Term Goal 3 (Week 2): Pt will be able to stand with min A to RW within 2 minutes to prep for LB dressing. ?OT Short Term Goal 3 - Progress (Week 2): Met ?OT Short Term Goal 4 (Week 2): Pt will be able to transfer to [facility] with mod A. ?OT Short Term Goal 4 - Progress (Week 2): Met ?Week 3:  OT Short Term Goal 1 (Week 3): sitting EOB, pt will don tshirt with min A. ?OT Short Term  Goal 2 (Week 3): Pt will transfer to [facility] with min A using LRAD ?OT Short Term Goal 3 (Week 3): Pt will perform toileting tasks with mod A ?OT Short Term Goal 4 (Week 3): Pt will complete bathing tasks with mod A seated EOB or in w/c ? ? ? ?[name] ?08/26/2021, 6:32 AM  ?

## 2021-08-26 NOTE — Progress Notes (Signed)
?                                                       PROGRESS NOTE ? ? ?Subjective/Complaints: ?No new complaints this morning ?Would like to get back into bed ?Ate some of lunch ?Having some back pain, offered kpad and he would like to try ? ? ?ROS- ? ?Pt denies SOB, (+) abd pain, CP, N/V/C/D, and vision changes, +insomnia, +back pain ? ? ?Objective: ?  ?No results found. ?No results for input(s): WBC, HGB, HCT, PLT in the last 72 hours. ? ?No results for input(s): NA, K, CL, CO2, GLUCOSE, BUN, CREATININE, CALCIUM in the last 72 hours. ? ? ? ?Intake/Output Summary (Last 24 hours) at 08/26/2021 1603 ?Last data filed at 08/26/2021 1200 ?Gross per 24 hour  ?Intake 415 ml  ?Output 1100 ml  ?Net -685 ml  ?  ? ?  ? ?Physical Exam: ?Vital Signs ?Blood pressure (!) 102/56, pulse 74, temperature 98.1 ?F (36.7 ?C), temperature source Oral, resp. rate 17, height 5\' 8"  (1.727 m), weight 78.9 kg, SpO2 100 %. ?Gen: no distress, normal appearing, BMI 26.43 ?HEENT: oral mucosa pink and moist, NCAT ?Cardio: Reg rate ?Chest: normal effort, normal rate of breathing, [place] in place ?Abd: soft, non-distended ?Ext: no edema ?Psychiatric: appropriate- flat ?Neurological: vague- alert- asking to go outside ? Skin: RUE less swelling trace to 1+- hand - esp hand better, but possible tissue necrosis- looks more erythematous- not raised- much better- swelling resolved of RUE ?LUE 5/5 ?RUE 4+/5 delt bi, tri grip ?LLE- 5/5 ?RLE- 5-/5- weaker on R side  ?Ext  ?2+ LE edema to mid calf and 1-2+ RUE swelling  ?Neurological:  ?   Mental Status: He is oriented to person, place, and time.  ?   Comments: Left facial weakness. Lethargic and tended to drift off. Internally distracted and needed cues to follow simple motor commands. He was able to use calender independently to recall date. Able to recall age and DOB.   ?Sleepy, but Ox3- knew at [facility] cone and why; and month/year ?Sensation intact in all 4 extremities  ?Assessment/Plan: ?1. Functional  deficits which require 3+ hours per day of interdisciplinary therapy in a comprehensive inpatient rehab setting. ?Physiatrist is providing close team supervision and 24 hour management of active medical problems listed below. ?Physiatrist and rehab team continue to assess barriers to discharge/monitor patient progress toward functional and medical goals ? ?Care Tool: ? ?Bathing ?   ?Body parts bathed by patient: Chest, Abdomen  ? Body parts bathed by helper: Buttocks, Right upper leg, Left upper leg, Left lower leg, Right lower leg ?  ?  ?Bathing assist Assist Level: Moderate Assistance - Patient 50 - 74% ?  ?  ?Upper Body Dressing/Undressing ?Upper body dressing   ?What is the patient wearing?: Pull over shirt ?   ?Upper body assist Assist Level: Minimal Assistance - Patient > 75% ?   ?Lower Body Dressing/Undressing ?Lower body dressing ? ? ?   ?What is the patient wearing?: Incontinence brief, Pants ? ?  ? ?Lower body assist Assist for lower body dressing: Moderate Assistance - Patient 50 - 74% ?   ? ?Toileting ?Toileting    ?Toileting assist Assist for toileting: 2 Helpers ?  ?  ?Transfers ?Chair/bed transfer ? ?Transfers assist ?   ? ?  Chair/bed transfer assist level: Minimal Assistance - Patient > 75% ?Chair/bed transfer assistive device: Walker ?  ?Locomotion ?Ambulation ? ? ?Ambulation assist ? ? Ambulation activity did not occur: Safety/medical concerns ? ?Assist level: Minimal Assistance - Patient > 75% ?Assistive device: Walker-rolling ?Max distance: 72'  ? ?Walk 10 feet activity ? ? ?Assist ? Walk 10 feet activity did not occur: Safety/medical concerns ? ?Assist level: Minimal Assistance - Patient > 75% ?Assistive device: Walker-rolling  ? ?Walk 50 feet activity ? ? ?Assist Walk 50 feet with 2 turns activity did not occur: Safety/medical concerns ? ?  ?   ? ? ?Walk 150 feet activity ? ? ?Assist Walk 150 feet activity did not occur: Safety/medical concerns ? ?  ?  ?  ? ?Walk 10 feet on uneven surface   ?activity ? ? ?Assist Walk 10 feet on uneven surfaces activity did not occur: Safety/medical concerns ? ? ?  ?   ? ?Wheelchair ? ? ? ? ?Assist Is the patient using a wheelchair?: Yes ?Type of Wheelchair: Manual ?  ? ?Wheelchair assist level: Dependent - Patient 0% ?   ? ? ?Wheelchair 50 feet with 2 turns activity ? ? ? ?Assist ? ?  ?Wheelchair 50 feet with 2 turns activity did not occur: Safety/medical concerns ? ? ?   ? ?Wheelchair 150 feet activity  ? ? ? ?Assist ? Wheelchair 150 feet activity did not occur: Safety/medical concerns ? ? ?   ? ?Blood pressure (!) 102/56, pulse 74, temperature 98.1 ?F (36.7 ?C), temperature source Oral, resp. rate 17, height 5\' 8"  (1.727 m), weight 78.9 kg, SpO2 100 %. ? ?Medical Problem List and Plan: ?1. Functional deficits secondary to debility from  thoracic dissection of aneurysm  ?            -patient may  shower ?            -ELOS/Goals: 12-14 days min A to supervision ? D/c date 4/19 ? Continue CIR- PT, OT and SLP ?2.  Antithrombotics: ?-DVT/anticoagulation:  Pharmaceutical: Lovenox. Will order dopplers of RUE.  ?            -antiplatelet therapy: N/A ?3. Back pain: add kpad. continue oxycodone prn  ? 4/4- having a lot of lower chest and back pain- con't pain meds- will add lidocaine patches for night time for back pain.  ? 4/11- pain usually controlled with meds ?4. Mood: LCSW to follow for evaluation and support.  ?            -antipsychotic agents: N/A ?5. Neuropsych: This patient may be  capable of making decisions on his own behalf. ?6. Skin/Wound Care: Routine pressure relief measures.  Monitor areas on right forearm/wrist for demarcation ?            -will add Vitamin C and Zinc to promote healing.  ?7. Fluids/Electrolytes/Nutrition: Monitor I/O. Check lytes in am.  ?8. Ruptured  descending thoracic aneurysm s/p repair: BP management.  ?            --will need repeat CTA 4 weeks (monitoring SCr  for improvement) ?9. Acute on chronic renal failure: with new CKD Stage  IV Improvement in BUN/SCr- 39/2.38 ?            --monitor with serial checks. Avoid nephrotoxic medications.  ? 3/31- Cr and BUN stable con't to monitor weekly.  ? 4/3- Cr still 2.3-2.4- is stable- per Vascular wants pt kidney function to improve some, before CTA< however  need to check for PE/so will do both ? 4/6- labs in AM ? 4/7- Cr 2.46- very slightly increased- will con't regimen ? Cr 2.49 and BUN 40 on 4/11, continue to monitor ?10. Acute blood loss anemia: Hgb reviewed and improving, monitor weekly.  ?11. Hypoxia: continue oxygen via nasal cannula as needed  ?            --Off diuresis. Resp rate increase with minimal movement- will monitor O2 sats.  ? 3/31- monitor closely, since  SOB with minimal exertion. ?4/3- still requiring O2- will check for PE this evening- since pt's still has DOE.  ?4/4- Couldn't do CTA- so ordered VQ scabn- pending.   ?4/5- has PE- but no DVT- has filter from 1995- d/w pulmonary- risk of AC is worse than no AC- will hold off- due to high risk of bleeding profusely per vascular-  ?Asking palliative care to help with goals.  ? 4/6- pt wants to be full code right now palliative care ot see again today- family coming today.  ?4/7- O2 sats lower this AM- on RA- needed to be put back on O2 ?4/10- does well when wears O2.  ?12. ABLA: Improving with Epogen and thrombocytopenia has resolved.  Continue to monitor as needed.  ?13. Polysubstance abuse: Educated on importance of cessation ?            --smokes > 1 PPD and marijuana/cocaine-->Has planned to quit.  ?14. Obesity: BMI 30.- initially documented as BMI of 68- was wrong- Educate on diet and importance of weight loss to help promote mobility and overall health. Now BMI down to 26.43. ?15. Ileus: Has been refusing daily suppository--last BM on 03/25.  ?--Intake poor--0-50% in the past 24 hours with abdominal distension. ?--Will increase Miralax to bid.Check KUB for follow up.  ?3/31- KUB looks better, but still distended loops of bowel-  not eating much- will start Reglan 5 mg TID for the moment and see if things improve-KUB improving today , add dulc supp, mylicon ?4/10- BM Friday- needs more sorbitol and Fleet's enema ?4/11- large BM this AM-

## 2021-08-26 NOTE — Progress Notes (Signed)
Physical Therapy Session Note ? ?Patient Details  ?Name: Edward Hopkins ?MRN: [id number] ?Date of Birth: [birth year] ? ?Today's Date: 08/26/2021 ?PT Individual Time: 3112-1624 ?PT Individual Time Calculation (min): 53 min  ? ?Short Term Goals: ?Week 2:  PT Short Term Goal 1 (Week 2): =LTG due to ELOS ? ?Skilled Therapeutic Interventions/Progress Updates:  ?BED MOBILITY: ?Supine to edge of bed min guard assist to min assist to fully elevate trunk into sitting with HOB 35 degrees and rail used. Once sitting up pt able to self scoot hips closer to edge of bed with trunk leaned back on UE's supporting pt with hands placed posterior to hips on bed and LE's out in full extension. With time and cues pt able to flex knees to get feet flat on floor and bring trunk forward into improved sitting position at edge of bed.  ? ?TRANSFERS: ?Min assist with cues on weight shifting and hand placement to stand from bed surface to walker. With walker min guard assist for 5 pivot steps bed>wheelchair. Once in front of wheelchair min guard assist for controlled sitting into wheelchair.  ? ?STRENGTHENING:  ?Supine in bed prior to getting up: bil LE's- ankle pumps with manual resistance, quad sets with 5 second holds, heel slides with manual resistance, SLR's and hip abd/add x 10 reps each. Limited range of motion noted at bil hips with heel slides, SLR's and hip abd/add ex's, left>right side.  ?Seated in wheelchair with feet on floor: bil LE': heel raises  x 5 reps. Long arc quads for 10 reps each. With yellow band resistance for HS curls x 10 reps.  ? ?END OF SESSION: ?Session continued to focus on strengthening and general mobility at pt was able to tolerate with rest breaks as needed due to shortness of breath. No significant issues noted with VSS with session. Pt left in wheelchair with lap alarm belt in place and all needs in reach.  ? ? ?Therapy Documentation ?Precautions:  ?Precautions ?Precautions: Fall ?Precaution Comments: mild R  hemipareisis, 2L O2, slow to initiate ?Restrictions ?Weight Bearing Restrictions: No ?RLE Weight Bearing: Partial weight bearing ? ?Vital Signs: ?At start of session: BP 119/73, HR 72, SaO2 99% on 2 lpm ?At end of session: BP 13/61, HR 78, SaO2 100% on 2 lpm ?With spot checks throughout session SaO2 >/=98% on 2 lpm ? ?Pain: ?Pain Assessment ?Pain Scale: 0-10 ?Pain Score: 0-No pain ? ?  ? ?Therapy/Group: Individual Therapy ? ?Willow Ora, PTA, CLT ?[place] ?08/26/21, 12:11 PM   ?

## 2021-08-26 NOTE — Progress Notes (Signed)
Occupational Therapy Session Note ? ?Patient Details  ?Name: OREL COOLER ?MRN: [id number] ?Date of Birth: November 27, 1950 ? ?Today's Date: 08/26/2021 ?OT Individual Time: 7793-9030 ?OT Individual Time Calculation (min): 55 min  ? ? ?Short Term Goals: ?Week 3:  OT Short Term Goal 1 (Week 3): sitting EOB, pt will don tshirt with min A. ?OT Short Term Goal 2 (Week 3): Pt will transfer to [facility] with min A using LRAD ?OT Short Term Goal 3 (Week 3): Pt will perform toileting tasks with mod A ?OT Short Term Goal 4 (Week 3): Pt will complete bathing tasks with mod A seated EOB or in w/c ? ?Skilled Therapeutic Interventions/Progress Updates:  ?  Pt sleeping in bed upon arrival but easily aroused. Pt had eaten approx 80% of lunch. Pt pleased that he felt like eating. Pt declined OOB activities but agreeable to sitting EOB. Supine>sit EOB with mod A. Sitting balance with min A. Sit<>stand from EOB X 4 with min A. Sit>supine with mod A. BUE therex with HOB elevated: overhead presses 3x15, chest presses 3x15, biceps curls 3x15 with 4# bar. Rest breaks between each set. Pt remained in bed with all needs within reach. Bed alarm activated.  ? ?Therapy Documentation ?Precautions:  ?Precautions ?Precautions: Fall ?Precaution Comments: mild R hemipareisis, 2L O2, slow to initiate ?Restrictions ?Weight Bearing Restrictions: No ?RLE Weight Bearing: Partial weight bearing ?  ?Pain: ? Pt denies pain this afternoon ? ? ?Therapy/Group: Individual Therapy ? ?[name] ?08/26/2021, 2:27 PM ?

## 2021-08-26 NOTE — Progress Notes (Signed)
Occupational Therapy Session Note ? ?Patient Details  ?Name: Edward Hopkins ?MRN: [id number] ?Date of Birth: 1951-02-18 ? ?Today's Date: 08/26/2021 ?OT Individual Time: 4098-1191 ?OT Individual Time Calculation (min): 25 min  ? ? ?Short Term Goals: ?Week 2:  OT Short Term Goal 1 (Week 2): Pt will be able to sit to EOB with min A within 2 minutes demonstrating improved initiation and motor skills. ?OT Short Term Goal 1 - Progress (Week 2): Met ?OT Short Term Goal 2 (Week 2): sitting EOB, pt will don tshirt with min A. ?OT Short Term Goal 2 - Progress (Week 2): Progressing toward goal ?OT Short Term Goal 3 (Week 2): Pt will be able to stand with min A to RW within 2 minutes to prep for LB dressing. ?OT Short Term Goal 3 - Progress (Week 2): Met ?OT Short Term Goal 4 (Week 2): Pt will be able to transfer to [facility] with mod A. ?OT Short Term Goal 4 - Progress (Week 2): Met ?Week 3:  OT Short Term Goal 1 (Week 3): sitting EOB, pt will don tshirt with min A. ?OT Short Term Goal 2 (Week 3): Pt will transfer to [facility] with min A using LRAD ?OT Short Term Goal 3 (Week 3): Pt will perform toileting tasks with mod A ?OT Short Term Goal 4 (Week 3): Pt will complete bathing tasks with mod A seated EOB or in w/c ? ?Skilled Therapeutic Interventions/Progress Updates:  ?  Patient received sidelying in bed.  Patient introducing himself as "[name]"  in good spirits.  Patient reports not wanting to get out of bed because of dizziness.  Not experiencing dizziness - but afraid movement will "set it off."  Encouraged patient to try, and explained he could move slowly to allow himself to adapt.  Patient very quickly transitioned up to sitting using bed rail ([place] slightly elevated.)  At edge of bed - patient without report of dizziness, nausea, etc.  Patient indicates feeling cold.  In unsupported sitting at edge of bed, patient shifted hips well forward with extended legs - cued to scoot back on bed to not slide off edge of bed.  Patient able  to don sweat pants and tshirt with mod assist (pants) and set up / min tshirt.  Patient assisted back to bed and bed alarm turned on.  Nursing call bell in hand.   ? ?Therapy Documentation ?Precautions:  ?Precautions ?Precautions: Fall ?Precaution Comments: mild R hemipareisis, 2L O2, slow to initiate ?Restrictions ?Weight Bearing Restrictions: No ?RLE Weight Bearing: Partial weight bearing ? ?  ?Pain: ?Patient seemingly unable to score pain.   ?"Hurting all over" as he was dozing off to sleep.  ? ?  ?  ? ? ?Therapy/Group: Individual Therapy ? ?Mariah Milling ?08/26/2021, 11:55 AM ?

## 2021-08-27 DIAGNOSIS — Z9981 Dependence on supplemental oxygen: Secondary | ICD-10-CM

## 2021-08-27 NOTE — Progress Notes (Signed)
Occupational Therapy Session Note ? ?Patient Details  ?Name: Edward Hopkins ?MRN: [id number] ?Date of Birth: 30-May-1950 ? ?Today's Date: 08/27/2021 ?OT Individual Time: 1005-1100 ?OT Individual Time Calculation (min): 55 min  ? ? ?Short Term Goals: ?  Week 3:  OT Short Term Goal 1 (Week 3): sitting EOB, pt will don tshirt with min A. ?OT Short Term Goal 2 (Week 3): Pt will transfer to [facility] with min A using LRAD ?OT Short Term Goal 3 (Week 3): Pt will perform toileting tasks with mod A ?OT Short Term Goal 4 (Week 3): Pt will complete bathing tasks with mod A seated EOB or in w/c ?  ? ?Skilled Therapeutic Interventions/Progress Updates:  ?  Pain: pt c/o back pain throughout the session, his sit and stand tolerance was low. ? ?Pt received in bed and with persuasion, agreed to basic self care.  Pt able to sit to EOB with Supervision, stood to RW with CGA, stand pivot to wc with CGA/min A.  He tends to sit very close to EOB with feet close together prior to standing which does not look safe, but pt states "this is the only way I can get up".  Pt sat in wc to bathe UB with set up. LB mod A.  Donned shirt with set up but mod-max LB for pants.   ?Pt brushed teeth.  Tried to have pt sit up for 30 min prior to next session, but pt insisted on laying down stating his back was in too much pain. ? ?Pt in bed with all needs met and alarm set.  ? ?Therapy Documentation ?Precautions:  ?Precautions ?Precautions: Fall ?Precaution Comments: mild R hemipareisis, 2L O2, slow to initiate ?Restrictions ?Weight Bearing Restrictions: No ?RLE Weight Bearing: Partial weight bearing ? ? Therapy/Group: Individual Therapy ? ?[place] ?08/27/2021, 8:02 AM ?

## 2021-08-27 NOTE — Progress Notes (Addendum)
Physical Therapy Session Note ? ?Patient Details  ?Name: Edward Hopkins ?MRN: [id number] ?Date of Birth: 01/20/1951 ? ?Today's Date: 08/27/2021 ?PT Individual Time: 1017-5102 ?PT Individual Time Calculation (min): 25 min  ? ?Short Term Goals: ?Week 1:  PT Short Term Goal 1 (Week 1): Pt will complete least restrictive transfer with min A consistently ?PT Short Term Goal 1 - Progress (Week 1): Not met ?PT Short Term Goal 2 (Week 1): Pt will ambulate x 25 ft with LRAD and min A ?PT Short Term Goal 2 - Progress (Week 1): Not met ?PT Short Term Goal 3 (Week 1): Pt will initiate stair training as safe and able ?PT Short Term Goal 3 - Progress (Week 1): Not met ?Week 2:  PT Short Term Goal 1 (Week 2): =LTG due to ELOS ?   ? ?Skilled Therapeutic Interventions/Progress Updates:  ?Tx 1: ? ?Pt resting in bed, stating that he was "freezing" but he had made no effort to get the blanket at the foot of his bed.  Pt reported pain in LB, rated 7/10.  Pt received pain meds after PT called RN.  He declined getting OOB, stating "I just got in the bed." Pt on 2L O2 via [place]. ? ?Pt in L side lying, but unable to tolerate attempting any exercises in this position.  Pt rolled into supine slowly iwht supervision.   ? ?In supine, with HOB raised, pt performed 10 x 1 each: bil adductor squeezes against pillow, R/L short arc quad knee extension, bil ankle pumps.  ? ?At end of session, pt resting in bed with needs at hand and bed alarm set. ? ?Tx 2; ? ?Pt dozing in bed, on 2L O2 via [place].   He reported pain 7/10 , low back and sternum areas.  He politely declined participating in tx.  Pt suggested bedside tx, but pt also declined. PT informed [place], Therapist, sports. ?   ? ?Therapy Documentation ?Precautions:  ?Precautions ?Precautions: Fall ?Precaution Comments: mild R hemipareisis, 2L O2, slow to initiate ?Restrictions ?Weight Bearing Restrictions: No ?RLE Weight Bearing: Weight bearing as tolerated ?   ? ?Pt missed 45 min tx. ? ?Therapy/Group: Individual  Therapy ? ?Arletha Marschke ?08/27/2021, 2:46 PM  ?

## 2021-08-27 NOTE — Progress Notes (Signed)
?                                                       PROGRESS NOTE ? ? ?Subjective/Complaints: ?Patient seen laying in bed this morning.  States he slept well overnight.  Sleep chart not updated. ? ?ROS: Denies CP, SOB, N/V/D ? ?Objective: ?  ?No results found. ?No results for input(s): WBC, HGB, HCT, PLT in the last 72 hours. ? ?No results for input(s): NA, K, CL, CO2, GLUCOSE, BUN, CREATININE, CALCIUM in the last 72 hours. ? ? ? ?Intake/Output Summary (Last 24 hours) at 08/27/2021 1029 ?Last data filed at 08/27/2021 0900 ?Gross per 24 hour  ?Intake 294 ml  ?Output --  ?Net 294 ml  ? ?  ? ?  ? ?Physical Exam: ?Vital Signs ?Blood pressure 124/71, pulse 78, temperature 98.2 ?F (36.8 ?C), resp. rate 14, height 5\' 8"  (1.727 m), weight 78.9 kg, SpO2 100 %. ?Gen: NAD ?HEENT: oral mucosa pink and moist, NCAT ?Cardio: Reg rate ?Chest: normal effort, normal rate of breathing, + [place] ?Abd: soft, non-distended ?Ext: no edema ?Psychiatric: appropriate- flat ?Neurological: vague- alert- asking to go outside ?Motor: LUE 5/5 ?RUE 4+/5 delt bi, tri grip ?LLE- 5/5 ?RLE- 5-/5- weaker on R side, stable ? ?Assessment/Plan: ?1. Functional deficits which require 3+ hours per day of interdisciplinary therapy in a comprehensive inpatient rehab setting. ?Physiatrist is providing close team supervision and 24 hour management of active medical problems listed below. ?Physiatrist and rehab team continue to assess barriers to discharge/monitor patient progress toward functional and medical goals ? ?Care Tool: ? ?Bathing ?   ?Body parts bathed by patient: Chest, Abdomen  ? Body parts bathed by helper: Buttocks, Right upper leg, Left upper leg, Left lower leg, Right lower leg ?  ?  ?Bathing assist Assist Level: Moderate Assistance - Patient 50 - 74% ?  ?  ?Upper Body Dressing/Undressing ?Upper body dressing   ?What is the patient wearing?: Pull over shirt ?   ?Upper body assist Assist Level: Minimal Assistance - Patient > 75% ?   ?Lower Body  Dressing/Undressing ?Lower body dressing ? ? ?   ?What is the patient wearing?: Incontinence brief, Pants ? ?  ? ?Lower body assist Assist for lower body dressing: Moderate Assistance - Patient 50 - 74% ?   ? ?Toileting ?Toileting    ?Toileting assist Assist for toileting: 2 Helpers ?  ?  ?Transfers ?Chair/bed transfer ? ?Transfers assist ?   ? ?Chair/bed transfer assist level: Minimal Assistance - Patient > 75% ?Chair/bed transfer assistive device: Walker ?  ?Locomotion ?Ambulation ? ? ?Ambulation assist ? ? Ambulation activity did not occur: Safety/medical concerns ? ?Assist level: Minimal Assistance - Patient > 75% ?Assistive device: Walker-rolling ?Max distance: 73'  ? ?Walk 10 feet activity ? ? ?Assist ? Walk 10 feet activity did not occur: Safety/medical concerns ? ?Assist level: Minimal Assistance - Patient > 75% ?Assistive device: Walker-rolling  ? ?Walk 50 feet activity ? ? ?Assist Walk 50 feet with 2 turns activity did not occur: Safety/medical concerns ? ?  ?   ? ? ?Walk 150 feet activity ? ? ?Assist Walk 150 feet activity did not occur: Safety/medical concerns ? ?  ?  ?  ? ?Walk 10 feet on uneven surface  ?activity ? ? ?Assist Walk 10 feet on  uneven surfaces activity did not occur: Safety/medical concerns ? ? ?  ?   ? ?Wheelchair ? ? ? ? ?Assist Is the patient using a wheelchair?: Yes ?Type of Wheelchair: Manual ?  ? ?Wheelchair assist level: Dependent - Patient 0% ?   ? ? ?Wheelchair 50 feet with 2 turns activity ? ? ? ?Assist ? ?  ?Wheelchair 50 feet with 2 turns activity did not occur: Safety/medical concerns ? ? ?   ? ?Wheelchair 150 feet activity  ? ? ? ?Assist ? Wheelchair 150 feet activity did not occur: Safety/medical concerns ? ? ?   ? ?Blood pressure 124/71, pulse 78, temperature 98.2 ?F (36.8 ?C), resp. rate 14, height 5\' 8"  (1.727 m), weight 78.9 kg, SpO2 100 %. ? ?Medical Problem List and Plan: ?1. Functional deficits secondary to debility from  thoracic dissection of aneurysm  ?             Continue CIR ?2.  Antithrombotics: ?-DVT/anticoagulation:  Pharmaceutical: Lovenox.  ?            -antiplatelet therapy: N/A ?3. Back pain: add kpad. continue oxycodone prn  ? 4/4- having a lot of lower chest and back pain- con't pain meds- will add lidocaine patches for night time for back pain.  ? 4/11- pain usually controlled with meds ? Controlled with meds on 4/15 ?4. Mood: LCSW to follow for evaluation and support.  ?            -antipsychotic agents: N/A ?5. Neuropsych: This patient may be  capable of making decisions on his own behalf. ?6. Skin/Wound Care: Routine pressure relief measures.  Monitor areas on right forearm/wrist for demarcation ?            -will add Vitamin C and Zinc to promote healing.  ?7. Fluids/Electrolytes/Nutrition: Monitor I/Os ?8. Ruptured  descending thoracic aneurysm s/p repair: BP management.  ?            --will need repeat CTA 4 weeks (monitoring SCr  for improvement) ?9. Acute on chronic renal failure: with new CKD Stage IV Improvement in BUN/SCr- 39/2.38 ?            --monitor with serial checks. Avoid nephrotoxic medications.  ? 3/31- Cr and BUN stable con't to monitor weekly.  ? 4/3- Cr still 2.3-2.4- is stable- per Vascular wants pt kidney function to improve some, before CTA< however need to check for PE/so will do both ? 4/6- labs in AM ? 4/7- Cr 2.46- very slightly increased- will con't regimen ? Creatinine 2.49 on 4/11, stable ?10. Acute blood loss anemia: Hgb reviewed and improving, monitor weekly.  ? Hemoglobin 9.3 on 4/11 ?11. Hypoxia: continue oxygen via nasal cannula as needed  ?            --Off diuresis. Resp rate increase with minimal movement- will monitor O2 sats.  ? 3/31- monitor closely, since  SOB with minimal exertion. ?4/3- still requiring O2- will check for PE this evening- since pt's still has DOE.  ?4/4- Couldn't do CTA- so ordered VQ scabn- pending.   ?4/5- has PE- but no DVT- has filter from 1995- d/w pulmonary- risk of AC is worse than no AC- will  hold off- due to high risk of bleeding profusely per vascular-  ?Asking palliative care to help with goals.  ? 4/6- pt wants to be full code right now palliative care ot see again today- family coming today.  ?4/7- O2 sats lower this AM- on  RA- needed to be put back on O2 ?4/10- does well when wears O2. ?Continues to require supplemental oxygen on 4/15 ?12. ABLA: Improving with Epogen and thrombocytopenia has resolved.  Continue to monitor as needed.  ?13. Polysubstance abuse: Educated on importance of cessation ?            --smokes > 1 PPD and marijuana/cocaine-->Has planned to quit.  ?14. Obesity: BMI 30.- initially documented as BMI of 68- was wrong- Educate on diet and importance of weight loss to help promote mobility and overall health. Now BMI down to 26.43. ?15. Ileus: Has been refusing daily suppository--last BM on 03/25.  ?--Intake poor--0-50% in the past 24 hours with abdominal distension. ?--Will increase Miralax to bid.Check KUB for follow up.  ?3/31- KUB looks better, but still distended loops of bowel- not eating much- will start Reglan 5 mg TID for the moment and see if things improve-KUB improving today , add dulc supp, mylicon ?4/10- BM Friday- needs more sorbitol and Fleet's enema ?4/11- large BM this AM- but feels like still needs to go- will try to use smaller dose of sorbitol if needed next time.  ?Improving ?16. R hemiparesis with R facial droop- head CT already done- will assess if can get brain MRI ? 3/31- will ask vascular since had stent- and se eif can get MRI ? 4/3- appears has new stroke in L frontal and cerebellum 3 spots- c/w acute infarct/embolism from aneurysm,  ?17. RUE swelling- vascular ulcers/tissue necrosis? ? 3/31- with pain/sensitivity of RUE and tissue necrosis possibly, calling vascular to see additional w/u of RUE ? 4/9: ultrasound reviewed and negative for clots in lower extremities ?18. Intermittent chest pain-  PE ? 4/3- sometimes reproducible, but could also be a PE_  will check CTA.  ? 4/4- getting VQ scan- is pending ? 4/5- has PE- but doesn't have DVT- wait on AC as above. Per vascular and Pulmonary, but has risk of PE which is concerning.  ? 4/8: discussed result w

## 2021-08-28 NOTE — Progress Notes (Signed)
Occupational Therapy Session Note ? ?Patient Details  ?Name: Edward Hopkins ?MRN: [id number] ?Date of Birth: 05-04-1951 ? ?Today's Date: 08/28/2021 ?OT Individual Time: 5670-1410 ?OT Individual Time Calculation (min): 31 min  and Today's Date: 08/28/2021 ?OT Missed Time: 29 Minutes ?Missed Time Reason: Patient fatigue ? ? ?Short Term Goals: ?Week 3:  OT Short Term Goal 1 (Week 3): sitting EOB, pt will don tshirt with min A. ?OT Short Term Goal 2 (Week 3): Pt will transfer to [facility] with min A using LRAD ?OT Short Term Goal 3 (Week 3): Pt will perform toileting tasks with mod A ?OT Short Term Goal 4 (Week 3): Pt will complete bathing tasks with mod A seated EOB or in w/c ? ?Skilled Therapeutic Interventions/Progress Updates:  ?  Pt resting in bed upon arrival. Pt had not eaten breakfast. Pt stated he will eat some later but did request orange juice. Pt requested to use urinal. Pt able to use urinal independently but required assistance changing his brief. Rolling R/L in bed with CGA using bed rails. Pt able to raise buttocks in bed to facilitate pulling pants over hip. Pt able to reposition in bed using headboard. Pt requested to be covered up and stated he was "worn out." Pt declined sitting EOB. Pt remained in bed with all needs within reach. Bed alarm activated. ? ?Therapy Documentation ?Precautions:  ?Precautions ?Precautions: Fall ?Precaution Comments: mild R hemipareisis, 2L O2, slow to initiate ?Restrictions ?Weight Bearing Restrictions: No ?RLE Weight Bearing: Weight bearing as tolerated ?General: ?General ?OT Amount of Missed Time: 29 Minutes ?Pain: ? Pt c/o abdominal pain with activity; repositioned ? ? ?Therapy/Group: Individual Therapy ? ?[name] ?08/28/2021, 8:39 AM ?

## 2021-08-28 NOTE — Progress Notes (Addendum)
Physical Therapy Session Note ? ?Patient Details  ?Name: Edward Hopkins ?MRN: [id number] ?Date of Birth: 19-Feb-1951 ? ?Today's Date: 08/28/2021 ?PT Individual Time: 8307-3543 ?PT Individual Time Calculation (min): 30 min  ? ?Short Term Goals: ?Week 1:  PT Short Term Goal 1 (Week 1): Pt will complete least restrictive transfer with min A consistently ?PT Short Term Goal 1 - Progress (Week 1): Not met ?PT Short Term Goal 2 (Week 1): Pt will ambulate x 25 ft with LRAD and min A ?PT Short Term Goal 2 - Progress (Week 1): Not met ?PT Short Term Goal 3 (Week 1): Pt will initiate stair training as safe and able ?PT Short Term Goal 3 - Progress (Week 1): Not met ?Week 2:  PT Short Term Goal 1 (Week 2): =LTG due to ELOS ?  ? ?Skilled Therapeutic Interventions/Progress Updates:  ?Pt received lying in bed.  He had not eaten any breakfast, but proceeded to eat a Snickers bar that his nephew had brought him. He stated that he would try to eat lunch when PT urged him.  Pt on 2 L O2 via [place]. ? ?Pt rated pain 7/10 chest area.  In addition, unrated, pt c/o R ankle pain with movement.  Mild edema noted, and pt tender over lateral R malleolus.  ? ?Supine> sit with HOB raised, no rail, supervision.  Pt sat EOB with close supervision, in posterior pelvic tilt, with narrow BOS.  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  With cues, he separated his feet for better balance.  Sit> stand from raised bed, biased to L due to R ankle pain, CGA to RW.  Stand/step to wc with close supervision.  ? ?Gait training in room, x 15' with RW, CGA, biased to L as above, but appearing to bear some wt on RLE; x 5' backwards with CGA. ? ?Stand > sit with CGA to raised bed, cues for safe hand placement.  Sit> supine with supervision, ballistic movement to bring bil LEs onto bed.  Pt scooted up in bed using bil rails, after HOB lowered slightly. ? ?At end of session, pt resting in bed with needs at hand and bed alarm set.  Pt informed Loma Sousa, RN about R ankle and left a message for Dr Dagoberto Ligas to assess it.  ?   ? ?Therapy Documentation ?Precautions:  ?Precautions ?Precautions: Fall ?Precaution Comments: mild R hemipareisis, 2L O2, slow to initiate ?Restrictions ?Weight Bearing Restrictions: No ?RLE Weight Bearing: Weight bearing as tolerated ?   ? ? ? ?Therapy/Group: Individual Therapy ? ?[name] ?08/28/2021, 12:15 PM  ?

## 2021-08-28 NOTE — Progress Notes (Signed)
Physical Therapy Session Note ? ?Patient Details  ?Name: LEVELLE EDELEN ?MRN: [id number] ?Date of Birth: 1950-09-11 ? ?Today's Date: 08/28/2021 ?PT Individual Time: 6333-5456 ?PT Individual Time Calculation (min): 55 min  ? ?Short Term Goals: ?Week 2:  PT Short Term Goal 1 (Week 2): =LTG due to ELOS ? ?Skilled Therapeutic Interventions/Progress Updates:  ?  Pt received seated in bed, agreeable to PT session. Pt reports ongoing pain in his R ankle, tender to the touch on lateral aspect of malleolus and increased pain with PROM inversion>eversion and with DF. Pt also reports pain in WB on limb. Nursing notified of R ankle pain and edema. Supine to sit with CGA with use of bedrail. Sit to stand with CGA to RW. Stand pivot transfer bed to w/c with RW and CGA. Trialed use of TIS chair, pt reports increased comfort with use of chair. Pt taken outdoors for improved mood and therapy buy-in. Pt declines any standing outdoors due to ankle pain. Pt requests to return to bed at end of session. Similar transfer method as noted above. Sit to supine Supervision with use of bedrail. Pt left in L sidelying in bed with needs in reach, bed alarm in place. Offered pt ice pack for R ankle, pt declines. Pt on 2L O2 via [place] throughout session, SpO2 remains at 93% (+). ? ?Therapy Documentation ?Precautions:  ?Precautions ?Precautions: Fall ?Precaution Comments: mild R hemipareisis, 2L O2, slow to initiate ?Restrictions ?Weight Bearing Restrictions: No ?RLE Weight Bearing: Weight bearing as tolerated ? ? ? ? ?Therapy/Group: Individual Therapy ? ? ?Excell Seltzer, PT, DPT, CSRS ?08/28/2021, 3:43 PM  ?

## 2021-08-28 NOTE — Progress Notes (Signed)
Pt sleeping upon arrival. Pt was woken up by nursing to take medications. Pt refused to take medications, stating " I dont have therapy today, I don't want any medications, you can take them". Pt educated on medications that are due at 8 am and importance of taking meds. Pt continued to refuse medications. ?

## 2021-08-29 ENCOUNTER — Inpatient Hospital Stay (HOSPITAL_COMMUNITY): Payer: Medicare Other

## 2021-08-29 LAB — CBC
HCT: 31.3 % — ABNORMAL LOW (ref 39.0–52.0)
Hemoglobin: 10 g/dL — ABNORMAL LOW (ref 13.0–17.0)
MCH: 27.2 pg (ref 26.0–34.0)
MCHC: 31.9 g/dL (ref 30.0–36.0)
MCV: 85.1 fL (ref 80.0–100.0)
Platelets: 254 10*3/uL (ref 150–400)
RBC: 3.68 MIL/uL — ABNORMAL LOW (ref 4.22–5.81)
RDW: 14.8 % (ref 11.5–15.5)
WBC: 8.4 10*3/uL (ref 4.0–10.5)
nRBC: 0 % (ref 0.0–0.2)

## 2021-08-29 LAB — BASIC METABOLIC PANEL
Anion gap: 8 (ref 5–15)
BUN: 64 mg/dL — ABNORMAL HIGH (ref 8–23)
CO2: 29 mmol/L (ref 22–32)
Calcium: 9.3 mg/dL (ref 8.9–10.3)
Chloride: 98 mmol/L (ref 98–111)
Creatinine, Ser: 2.94 mg/dL — ABNORMAL HIGH (ref 0.61–1.24)
GFR, Estimated: 22 mL/min — ABNORMAL LOW (ref >60)
Glucose, Bld: 103 mg/dL — ABNORMAL HIGH (ref 70–99)
Potassium: 4.8 mmol/L (ref 3.5–5.1)
Sodium: 135 mmol/L (ref 135–145)

## 2021-08-29 MED ORDER — OXYCODONE HCL 5 MG PO TABS
2.5000 mg | ORAL_TABLET | Freq: Four times a day (QID) | ORAL | Status: DC | PRN
  Administered 2021-08-29: 2.5 mg via ORAL
  Filled 2021-08-29 (×2): qty 1

## 2021-08-29 MED ORDER — SORBITOL 70 % SOLN
15.0000 mL | Freq: Once | Status: AC
  Administered 2021-08-29: 15 mL via ORAL
  Filled 2021-08-29: qty 30

## 2021-08-29 MED ORDER — SENNA 8.6 MG PO TABS
2.0000 | ORAL_TABLET | Freq: Every day | ORAL | Status: DC
  Administered 2021-08-30 – 2021-09-05 (×5): 17.2 mg via ORAL
  Filled 2021-08-29 (×8): qty 2

## 2021-08-29 MED ORDER — SODIUM CHLORIDE 0.9 % IV SOLN
INTRAVENOUS | Status: DC
  Administered 2021-08-29: 900 mL via INTRAVENOUS

## 2021-08-29 MED ORDER — DICLOFENAC SODIUM 1 % EX GEL
2.0000 g | Freq: Four times a day (QID) | CUTANEOUS | Status: DC
  Administered 2021-08-29 – 2021-09-06 (×16): 2 g via TOPICAL
  Filled 2021-08-29: qty 100

## 2021-08-29 MED ORDER — SORBITOL 70 % SOLN
30.0000 mL | Freq: Once | Status: DC
Start: 2021-08-29 — End: 2021-08-29

## 2021-08-29 MED ORDER — SORBITOL 70 % SOLN: 15.0000 mL | Freq: Once | Status: DC | PRN

## 2021-08-29 NOTE — Progress Notes (Addendum)
Notified that patient has rt ankle swelling and difficulty flexing foot by night shift nurse, Janett Billow, LPN.  Note from PT on Sunday 4/16 also indicated similar findings.  Placed order for 2D X-ray of rt ankle, Voltaren gel, and care order for rest, ice, and elevation. ?

## 2021-08-29 NOTE — Progress Notes (Signed)
[facility] ? ?Subjective  - CC: right ankle pain ? ? ?Objective ?116/69 ?73 ?98 ?F (36.7 ?C) ?14 ?99% ? ?Intake/Output Summary (Last 24 hours) at 08/29/2021 0721 ?Last data filed at 08/29/2021 0500 ?Gross per 24 hour  ?Intake 712 ml  ?Output 1400 ml  ?Net -688 ml  ? ? ?Palpable radial and DP pulses B ?Moving all ext. ?Abdomin soft ?Lungs non labored breathing ? ? ?Assessment/Planning: ? ?08/01/21 ? ?Repair of descending thoracic dissection with rupture with stent graft without coverage of the left subclavian artery (34 mm x 34 mm x 20 cm Gore graft, 34 mm x 34 mm x 20 cm Gore graft, and 34 mm x 34 mm x 10 cm Gore graft) ?  Stent graft of infrarenal abdominal aorta x2 (23 mm x 4.5 cm Gore cuff and 26 mm x 4.5 cm Gore cuff) ? ?He has done very well with recovery over all ?We will plan for repeat CTA chest/Abdomin and Pelvis 4 weeks post op.  ? ?Roxy Horseman ?08/29/2021 ?7:21 AM ?-- ? ?Laboratory ?Lab Results: ?Recent Labs  ?  08/29/21 ?0533  ?WBC 8.4  ?HGB 10.0*  ?HCT 31.3*  ?PLT 254  ? ?BMET ?Recent Labs  ?  08/29/21 ?0533  ?NA 135  ?K 4.8  ?CL 98  ?CO2 29  ?GLUCOSE 103*  ?BUN 64*  ?CREATININE 2.94*  ?CALCIUM 9.3  ? ? ?COAG ?Lab Results  ?Component Value Date  ? INR 1.3 (H) 08/16/2021  ? INR 1.7 (H) 08/01/2021  ? INR 1.6 (H) 08/01/2021  ? ?No results found for: PTT ? ? ? ?

## 2021-08-29 NOTE — Progress Notes (Signed)
Passed along during shift change that patient had new swelling to R ankle. Upon assessment R ankle swollen and patient experiencing pain when flexing foot. Patient states "I am suppose to have an x-ray", no x-ray orders in. On call Luetta Nutting FNP notified and made aware. No new orders at this time. Have continued with plan of care. Patient in bed with call bell within reach. ?

## 2021-08-29 NOTE — Progress Notes (Signed)
Occupational Therapy Session Note ? ?Patient Details  ?Name: Edward Hopkins ?MRN: [id number] ?Date of Birth: [birth year] ? ?Today's Date: 08/29/2021 ?OT Individual Time: 3893-7342 ?OT Individual Time Calculation (min): 15 min  and Today's Date: 08/29/2021 ?OT Missed Time: 45 Minutes ?Missed Time Reason: Patient fatigue ? ? ?Short Term Goals: ?Week 3:  OT Short Term Goal 1 (Week 3): sitting EOB, pt will don tshirt with min A. ?OT Short Term Goal 2 (Week 3): Pt will transfer to [facility] with min A using LRAD ?OT Short Term Goal 3 (Week 3): Pt will perform toileting tasks with mod A ?OT Short Term Goal 4 (Week 3): Pt will complete bathing tasks with mod A seated EOB or in w/c ? ?Skilled Therapeutic Interventions/Progress Updates:  ?  Pt sleeping upon arrival. Mod verbal cues to arouse. Pt unable to keep eyes open. Encouraged pt to eat lunch but he stated he already ate some. Pt reports that he ate some hamburger patty. Pt's lunch was roasted chicken and mashed potatoes. Pt'c conversation very tangential and (?) confabulatory. Pt unable to follow commands or keep eyes open. Pt missed 45 mins skilled OT services. O2 sats 95% on 2L O2. Pt remained in bed with bed alarm activated.  ? ?Therapy Documentation ?Precautions:  ?Precautions ?Precautions: Fall ?Precaution Comments: mild R hemipareisis, 2L O2, slow to initiate ?Restrictions ?Weight Bearing Restrictions: No ?RLE Weight Bearing: Weight bearing as tolerated ?General: ?General ?OT Amount of Missed Time: 45 Minutes ?Vital Signs: ?  ?Pain: ? Pt c/o Rt ankle discomfort; emoitonal support ? ? ?Therapy/Group: Individual Therapy ? ?[name] ?08/29/2021, 1:24 PM ?

## 2021-08-29 NOTE — Progress Notes (Signed)
Physical Therapy Session Note ? ?Patient Details  ?Name: Edward Hopkins ?MRN: [id number] ?Date of Birth: 1950/10/19 ? ?Today's Date: 08/29/2021 ?PT Individual Time: 3491-7915 ?PT Individual Time Calculation (min): 45 min  ? ?Short Term Goals: ?Week 2:  PT Short Term Goal 1 (Week 2): =LTG due to ELOS ? ?Skilled Therapeutic Interventions/Progress Updates:  ?  Pt received seated in bed working on breakfast and cutting his fingernails, agreeable to PT session. Pt reports pain in his R ankle, premedicated prior to start of therapy session. Per MD xray results pending for R ankle. Deferred any standing or WB on RLE for AM session pending results. Pt agreeable to bed level exercises and declines to get up to his w/c this morning. Supine BLE AAROM hip flexion, knee flexion, hip abd, SAQ x 10 reps each. Pt exhibits significant tightness in B hips limiting flexion and abduction PROM and ability to strengthen muscles through full range. Pt agreeable to sit up to EOB. Supine to sit with min A needed for some trunk control. Pt is able to maintain sitting balance EOB with close Supervision. Seated reaching outside BOS and across midline with alt UE for balance challenge. Pt requests to return to supine due to fatigue. Sit to supine min A needed for some LE management. Pt left seated in bed with needs in reach, bed alarm in place. Pt on 2L O2 via [place] throughout session, SpO2 remains at 93% and higher. Pt missed 15 min of scheduled therapy session due to fatigue. ? ?Therapy Documentation ?Precautions:  ?Precautions ?Precautions: Fall ?Precaution Comments: mild R hemipareisis, 2L O2, slow to initiate ?Restrictions ?Weight Bearing Restrictions: No ?RLE Weight Bearing: Weight bearing as tolerated ?General: ?PT Amount of Missed Time (min): 15 Minutes ?PT Missed Treatment Reason: Patient fatigue ? ? ? ? ?Therapy/Group: Individual Therapy ? ? ?Excell Seltzer, PT, DPT, CSRS ? ?08/29/2021, 9:07 AM  ?

## 2021-08-29 NOTE — Progress Notes (Addendum)
?                                                       PROGRESS NOTE ? ? ?Subjective/Complaints: ? ?Pt reports R ankle hurting- for 7-8 days- since it got "smushed" by bed, not crushed though-  ? ?Pain 5/10 ankle- willing to take Oxycodone but wants 1/2 dose, not full pill-  ?LBM 3+ days ago. LBM documented as 4/14. Large. Incontinent.  ? ?Ate toast, and 1 bite sausage-not hungry still.  ?  ?ROS:  ?Pt denies SOB, abd pain, CP, N/V/(+) C/D, and vision changes ? ? ?Objective: ?  ?DG Ankle 2 Views Right ? ?Result Date: 08/29/2021 ?CLINICAL DATA:  Right ankle swelling. EXAM: RIGHT ANKLE - 2 VIEW COMPARISON:  None. FINDINGS: There is no evidence of fracture, dislocation, or joint effusion. There is no evidence of arthropathy or other focal bone abnormality. Mild diffuse soft tissue swelling. IMPRESSION: 1. Mild diffuse soft tissue swelling. No acute osseous abnormality. Electronically Signed   By: Titus Dubin M.D.   On: 08/29/2021 08:10   ?Recent Labs  ?  08/29/21 ?0533  ?WBC 8.4  ?HGB 10.0*  ?HCT 31.3*  ?PLT 254  ? ? ?Recent Labs  ?  08/29/21 ?0533  ?NA 135  ?K 4.8  ?CL 98  ?CO2 29  ?GLUCOSE 103*  ?BUN 64*  ?CREATININE 2.94*  ?CALCIUM 9.3  ? ? ? ? ?Intake/Output Summary (Last 24 hours) at 08/29/2021 0936 ?Last data filed at 08/29/2021 0500 ?Gross per 24 hour  ?Intake 712 ml  ?Output 1100 ml  ?Net -388 ml  ?  ? ?  ? ?Physical Exam: ?Vital Signs ?Blood pressure 116/69, pulse 73, temperature 98 ?F (36.7 ?C), resp. rate 14, height 5\' 8"  (1.727 m), weight 76.7 kg, SpO2 99 %. ? ? ?General: awake, alert, appropriate, sitting up in bed; on O2 by [place]; 2L; NAD ?HENT: conjugate gaze; oropharynx moist ?CV: regular rate; no JVD ?Pulmonary: CTA B/L; no W/R/R- very slightly decreased at bases- O2 in place ?GI: soft, NT, ND, (+)BS-  hypoactive ?Psychiatric: appropriate- flat ?Neurological: Ox3 but vague- hard to understand due to mumbling sometimes ?MS: R ankle mild swelling- TTP medial and lateral malleolus ?Motor: LUE 5/5 ?RUE 4+/5  delt bi, tri grip ?LLE- 5/5 ?RLE- 5-/5- weaker on R side, stable ? ?Assessment/Plan: ?1. Functional deficits which require 3+ hours per day of interdisciplinary therapy in a comprehensive inpatient rehab setting. ?Physiatrist is providing close team supervision and 24 hour management of active medical problems listed below. ?Physiatrist and rehab team continue to assess barriers to discharge/monitor patient progress toward functional and medical goals ? ?Care Tool: ? ?Bathing ?   ?Body parts bathed by patient: Chest, Abdomen  ? Body parts bathed by helper: Buttocks, Right upper leg, Left upper leg, Left lower leg, Right lower leg ?  ?  ?Bathing assist Assist Level: Moderate Assistance - Patient 50 - 74% ?  ?  ?Upper Body Dressing/Undressing ?Upper body dressing   ?What is the patient wearing?: Pull over shirt ?   ?Upper body assist Assist Level: Minimal Assistance - Patient > 75% ?   ?Lower Body Dressing/Undressing ?Lower body dressing ? ? ?   ?What is the patient wearing?: Incontinence brief, Pants ? ?  ? ?Lower body assist Assist for lower body dressing: Moderate Assistance -  Patient 50 - 74% ?   ? ?Toileting ?Toileting    ?Toileting assist Assist for toileting: 2 Helpers ?  ?  ?Transfers ?Chair/bed transfer ? ?Transfers assist ?   ? ?Chair/bed transfer assist level: Contact Guard/Touching assist ?Chair/bed transfer assistive device: Walker ?  ?Locomotion ?Ambulation ? ? ?Ambulation assist ? ? Ambulation activity did not occur: Safety/medical concerns ? ?Assist level: Contact Guard/Touching assist ?Assistive device: Walker-rolling ?Max distance: 15  ? ?Walk 10 feet activity ? ? ?Assist ? Walk 10 feet activity did not occur: Safety/medical concerns ? ?Assist level: Contact Guard/Touching assist ?Assistive device: Walker-rolling  ? ?Walk 50 feet activity ? ? ?Assist Walk 50 feet with 2 turns activity did not occur: Safety/medical concerns ? ?  ?   ? ? ?Walk 150 feet activity ? ? ?Assist Walk 150 feet activity did  not occur: Safety/medical concerns ? ?  ?  ?  ? ?Walk 10 feet on uneven surface  ?activity ? ? ?Assist Walk 10 feet on uneven surfaces activity did not occur: Safety/medical concerns ? ? ?  ?   ? ?Wheelchair ? ? ? ? ?Assist Is the patient using a wheelchair?: Yes ?Type of Wheelchair: Manual ?  ? ?Wheelchair assist level: Dependent - Patient 0% ?   ? ? ?Wheelchair 50 feet with 2 turns activity ? ? ? ?Assist ? ?  ?Wheelchair 50 feet with 2 turns activity did not occur: Safety/medical concerns ? ? ?   ? ?Wheelchair 150 feet activity  ? ? ? ?Assist ? Wheelchair 150 feet activity did not occur: Safety/medical concerns ? ? ?   ? ?Blood pressure 116/69, pulse 73, temperature 98 ?F (36.7 ?C), resp. rate 14, height 5\' 8"  (1.727 m), weight 76.7 kg, SpO2 99 %. ? ?Medical Problem List and Plan: ?1. Functional deficits secondary to debility from  thoracic dissection of aneurysm  ?            Continue CIR- PT, OT - vascular says pt healing well- CT of chest, abdomen and pelvis  at 4 weeks post op ?2.  Antithrombotics: ?-DVT/anticoagulation:  Pharmaceutical: Lovenox.  ?            -antiplatelet therapy: N/A ?3. Back pain: add kpad. continue oxycodone prn  ? 4/4- having a lot of lower chest and back pain- con't pain meds- will add lidocaine patches for night time for back pain.  ? 4/17- would like [place] to reduce pain meds/oxycodone- will change to 2.5 mg to 5 mg q4 hours prn esp for R ankle pain ?4. Mood: LCSW to follow for evaluation and support.  ?            -antipsychotic agents: N/A ?5. Neuropsych: This patient may be  capable of making decisions on his own behalf. ?6. Skin/Wound Care: Routine pressure relief measures.  Monitor areas on right forearm/wrist for demarcation ?            -will add Vitamin C and Zinc to promote healing.  ?7. Fluids/Electrolytes/Nutrition: Monitor I/Os ?8. Ruptured  descending thoracic aneurysm s/p repair: BP management.  ?            --will need repeat CTA 4 weeks (monitoring SCr  for  improvement) ? 4.17- will need CT of Chest/abd/pelvis at 4 weeks ?9. Acute on chronic renal failure: with new CKD Stage IV Improvement in BUN/SCr- 39/2.38 ?            --monitor with serial checks. Avoid nephrotoxic medications.  ? 3/31- Cr  and BUN stable con't to monitor weekly.  ? 4/3- Cr still 2.3-2.4- is stable- per Vascular wants pt kidney function to improve some, before CTA< however need to check for PE/so will do both ? 4/6- labs in AM ? 4/7- Cr 2.46- very slightly increased- will con't regimen ? Creatinine 2.49 on 4/11, stable ? 4/17- Cr up to 2.94- and BUN up to 60 from 40- will give IVFs NS 75cc/hour x 24 hours- pt's intake poor. Will recheck BMP in AM  ?10. Acute blood loss anemia: Hgb reviewed and improving, monitor weekly.  ? Hemoglobin 9.3 on 4/11 ?11. Hypoxia: continue oxygen via nasal cannula as needed  ?            --Off diuresis. Resp rate increase with minimal movement- will monitor O2 sats.  ? 3/31- monitor closely, since  SOB with minimal exertion. ?4/3- still requiring O2- will check for PE this evening- since pt's still has DOE.  ?4/4- Couldn't do CTA- so ordered VQ scabn- pending.   ?4/5- has PE- but no DVT- has filter from 1995- d/w pulmonary- risk of AC is worse than no AC- will hold off- due to high risk of bleeding profusely per vascular-  ?Asking palliative care to help with goals.  ? 4/6- pt wants to be full code right now palliative care ot see again today- family coming today.  ?4/7- O2 sats lower this AM- on RA- needed to be put back on O2 ?4/17- pt's sats good on 2L O2 ?12. ABLA: Improving with Epogen and thrombocytopenia has resolved.  Continue to monitor as needed.  ?13. Polysubstance abuse: Educated on importance of cessation ?            --smokes > 1 PPD and marijuana/cocaine-->Has planned to quit.  ?14. Obesity: BMI 30.- initially documented as BMI of 68- was wrong- Educate on diet and importance of weight loss to help promote mobility and overall health. Now BMI down to  26.43. ?15. Ileus: Has been refusing daily suppository--last BM on 03/25.  ?--Intake poor--0-50% in the past 24 hours with abdominal distension. ?--Will increase Miralax to bid.Check KUB for follow up.  ?3/31- KUB looks

## 2021-08-29 NOTE — Progress Notes (Signed)
Patient ID: Edward Hopkins, male   DOB: 26-Nov-1950, 71 y.o.   MRN: [id number] ? ?Covering for primary SW, Auria ? ?Sw spoke with patient to discuss SNF preferences. Patient has a preference for [facility].  ? ?Patient has been declined by this facility.  ?

## 2021-08-30 LAB — URINALYSIS, ROUTINE W REFLEX MICROSCOPIC
Bilirubin Urine: NEGATIVE
Glucose, UA: NEGATIVE mg/dL
Hgb urine dipstick: NEGATIVE
Ketones, ur: NEGATIVE mg/dL
Nitrite: POSITIVE — AB
Protein, ur: NEGATIVE mg/dL
Specific Gravity, Urine: 1.011 (ref 1.005–1.030)
WBC, UA: >50 WBC/hpf — ABNORMAL HIGH (ref 0–5)
pH: 7 (ref 5.0–8.0)

## 2021-08-30 LAB — BASIC METABOLIC PANEL
Anion gap: 9 (ref 5–15)
BUN: 68 mg/dL — ABNORMAL HIGH (ref 8–23)
CO2: 29 mmol/L (ref 22–32)
Calcium: 9 mg/dL (ref 8.9–10.3)
Chloride: 96 mmol/L — ABNORMAL LOW (ref 98–111)
Creatinine, Ser: 2.78 mg/dL — ABNORMAL HIGH (ref 0.61–1.24)
GFR, Estimated: 23 mL/min — ABNORMAL LOW (ref >60)
Glucose, Bld: 108 mg/dL — ABNORMAL HIGH (ref 70–99)
Potassium: 4.8 mmol/L (ref 3.5–5.1)
Sodium: 134 mmol/L — ABNORMAL LOW (ref 135–145)

## 2021-08-30 LAB — FOLATE: Folate: 34 ng/mL (ref >5.9)

## 2021-08-30 LAB — RETICULOCYTES
Immature Retic Fract: 22 % — ABNORMAL HIGH (ref 2.3–15.9)
RBC.: 3.71 MIL/uL — ABNORMAL LOW (ref 4.22–5.81)
Retic Count, Absolute: 91.3 10*3/uL (ref 19.0–186.0)
Retic Ct Pct: 2.5 % (ref 0.4–3.1)

## 2021-08-30 LAB — IRON AND TIBC
Iron: 31 ug/dL — ABNORMAL LOW (ref 45–182)
Saturation Ratios: 14 % — ABNORMAL LOW (ref 17.9–39.5)
TIBC: 227 ug/dL — ABNORMAL LOW (ref 250–450)
UIBC: 196 ug/dL

## 2021-08-30 LAB — VITAMIN B12: Vitamin B-12: 349 pg/mL (ref 180–914)

## 2021-08-30 LAB — FERRITIN: Ferritin: 598 ng/mL — ABNORMAL HIGH (ref 24–336)

## 2021-08-30 MED ORDER — SODIUM CHLORIDE 0.9 % IV SOLN
250.0000 mg | Freq: Every day | INTRAVENOUS | Status: AC
  Administered 2021-08-30: 250 mg via INTRAVENOUS
  Filled 2021-08-30 (×2): qty 20

## 2021-08-30 MED ORDER — TRAMADOL HCL 50 MG PO TABS
50.0000 mg | ORAL_TABLET | Freq: Two times a day (BID) | ORAL | Status: DC | PRN
  Administered 2021-08-31 – 2021-09-06 (×4): 50 mg via ORAL
  Filled 2021-08-30 (×4): qty 1

## 2021-08-30 MED ORDER — SORBITOL 70 % SOLN
30.0000 mL | Freq: Once | Status: AC
  Administered 2021-08-30: 30 mL via ORAL
  Filled 2021-08-30: qty 30

## 2021-08-30 MED ORDER — SODIUM CHLORIDE 0.9 % IV SOLN: INTRAVENOUS | Status: AC

## 2021-08-30 NOTE — Progress Notes (Signed)
Ok to replete iron with Ferrlecit 250mg  IV q24 x2 per Dr. Dagoberto Ligas. ? ?Onnie Boer, PharmD, BCIDP, AAHIVP, CPP ?Infectious Disease Pharmacist ?08/30/2021 6:52 PM ? ? ?

## 2021-08-30 NOTE — Progress Notes (Signed)
Physical Therapy Session Note ? ?Patient Details  ?Name: Edward Hopkins ?MRN: [id number] ?Date of Birth: 1951-01-08 ? ?Today's Date: 08/30/2021 ?PT Individual Time: 8938-1017 ?PT Individual Time Calculation (min): 55 min  ? ?Short Term Goals: ?Week 2:  PT Short Term Goal 1 (Week 2): =LTG due to ELOS ? ?Skilled Therapeutic Interventions/Progress Updates:  ?  Pt received seated in bed, agreeable to PT session. Pt reports ongoing pain in his R ankle, utilizing ice pack at beginning of session for pain management. Pt requests pain medication from nursing at end of session. Supine to sit with min A needed for trunk elevation. Assisted pt with donning socks and pants while seated EOB. Sit to stand with up to mod A needed at times during session due to fatigue and pain. Stand pivot transfer to w/c with RW and min A. Pt taken outdoors via [place] chair for improved mood and therapy buy-in. Once outdoors pt encouraged to stand to RW. Sit to stand with mod A. Pt has increase in R ankle pain and declines any further standing or any gait this PM. Pt taken back to his room where he requests to return to bed. Stand pivot transfer w/c to bed with RW and min A. Sit to supine min A needed for some repositioning of LE. Pt left in L sidelying in bed with needs in reach, bed alarm in place. Pt on 2-3L O2 via [place] throughout session, SpO2 drops to 87% with activity but returns to 90% (+) with rest breaks. Pt missed 35 min of scheduled therapy session due to fatigue. ? ?Therapy Documentation ?Precautions:  ?Precautions ?Precautions: Fall ?Precaution Comments: mild R hemipareisis, 2L O2, slow to initiate ?Restrictions ?Weight Bearing Restrictions: No ?RLE Weight Bearing: Weight bearing as tolerated ?General: ?PT Amount of Missed Time (min): 35 Minutes ?PT Missed Treatment Reason: Patient fatigue ? ? ? ? ? ?Therapy/Group: Individual Therapy ? ? ?Excell Seltzer, PT, DPT, CSRS ?08/30/2021, 3:33 PM  ?

## 2021-08-30 NOTE — Progress Notes (Signed)
Patient ID: Edward Hopkins, male   DOB: October 27, 1950, 71 y.o.   MRN: [id number] ? ?SW followed up with patient to inform patient that [facility] currently has beds available and is willing to accept. Sw will have primary sw follow up with AD and submit patient auth. ? ?AD Raquel Sarna) ?Cell: [phone] ? ?

## 2021-08-30 NOTE — Progress Notes (Signed)
Physical Therapy Weekly Progress Note ? ?Patient Details  ?Name: Edward Hopkins ?MRN: [id number] ?Date of Birth: [birth year] ? ?Beginning of progress report period: August 21, 2021 ?End of progress report period: August 30, 2021 ? ? ?Patient has met 0 of 1 short term goals.  STG were set to LTG due to ELOS. Plan is for pt to d/c to SNF as his family cannot provide assistance for him at home at his CLOF. Pt is currently min A overall for bed mobility, min to mod A for transfers with RW, and min A for gait up to 15 ft with RW. Pt remains limited by decreased endurance and new R ankle pain likely due to strain/sprain. Pt has exhibited improved endurance and participation in therapy sessions over the past week as compared to the week before. ? ?Patient continues to demonstrate the following deficits muscle weakness and muscle joint tightness, decreased cardiorespiratoy endurance and decreased oxygen support, decreased memory, and decreased sitting balance, decreased standing balance, decreased postural control, and decreased balance strategies and therefore will continue to benefit from skilled PT intervention to increase functional independence with mobility. ? ?Patient not progressing toward long term goals.  See goal revision..  Plan of care revisions: d/c stair goal due to decreased safety and pt will not need to complete stairs at this time with d/c to SNF. ? ?PT Short Term Goals ?Week 2:  PT Short Term Goal 1 (Week 2): =LTG due to ELOS ?PT Short Term Goal 1 - Progress (Week 2): Progressing toward goal ?Week 3:  PT Short Term Goal 1 (Week 3): =LTG due to ELOS ? ?Therapy Documentation ?Precautions:  ?Precautions ?Precautions: Fall ?Precaution Comments: mild R hemipareisis, 2L O2, slow to initiate ?Restrictions ?Weight Bearing Restrictions: No ?RLE Weight Bearing: Weight bearing as tolerated ? ? ?Therapy/Group: Individual Therapy ? ? ?Excell Seltzer, PT, DPT, CSRS ?08/30/2021, 7:48 AM  ?

## 2021-08-30 NOTE — Plan of Care (Signed)
?  Problem: RH Stairs ?Goal: LTG Patient will ambulate up and down stairs w/assist (PT) ?Description: LTG: Patient will ambulate up and down # of stairs with assistance (PT) ?Flowsheets (Taken 08/30/2021 1543) ?LTG: Pt will ambulate up/down stairs assist needed:: (d/c goal due to decreased safety and d/c to SNF) -- ?Note: D/c goal due to decreased safety and d/c to SNF ?  ?

## 2021-08-30 NOTE — Progress Notes (Signed)
Patient ID: Edward Hopkins, male   DOB: [birth year], 71 y.o.   MRN: [id number] ? ?Team Conference Report to Patient/Family ? ?Team Conference discussion was reviewed with the patient and caregiver, including goals, any changes in plan of care and target discharge date.  Patient and caregiver express understanding and are in agreement.  The patient has a target discharge date of  (SNF). ? ?Sw spoke with patient and provided team conference updates. Sw informed patient that her preference of [facility] has been declined. Sw provided the patient with other accepted bed offers. Patient would like to see if [facility] has any bed availability.  ? ?Edward Hopkins ?08/30/2021, 12:24 PM  ?

## 2021-08-30 NOTE — Plan of Care (Signed)
?  Problem: Sit to Stand ?Goal: LTG:  Patient will perform sit to stand in prep for activites of daily living with assistance level (OT) ?Description: LTG:  Patient will perform sit to stand in prep for activites of daily living with assistance level (OT) ?Flowsheets (Taken 08/30/2021 0931) ?LTG: PT will perform sit to stand in prep for activites of daily living with assistance level: Minimal Assistance - Patient > 75% ?  ?Problem: RH Bathing ?Goal: LTG Patient will bathe all body parts with assist levels (OT) ?Description: LTG: Patient will bathe all body parts with assist levels (OT) ?Flowsheets (Taken 08/30/2021 0931) ?LTG: Pt will perform bathing with assistance level/cueing: (downgraded JLS) Moderate Assistance - Patient 50 - 74% ?Note: downgraded JLS ?  ?Problem: RH Dressing ?Goal: LTG Patient will perform lower body dressing w/assist (OT) ?Description: LTG: Patient will perform lower body dressing with assist, with/without cues in positioning using equipment (OT) ?Flowsheets (Taken 08/30/2021 0931) ?LTG: Pt will perform lower body dressing with assistance level of: (downgraded JLS) Moderate Assistance - Patient 50 - 74% ?Note: downgraded JLS ?  ?Problem: RH Toileting ?Goal: LTG Patient will perform toileting task (3/3 steps) with assistance level (OT) ?Description: LTG: Patient will perform toileting task (3/3 steps) with assistance level (OT)  ?Flowsheets (Taken 08/30/2021 0931) ?LTG: Pt will perform toileting task (3/3 steps) with assistance level: (downgraded JLS) Moderate Assistance - Patient 50 - 74% ?Note: downgraded JLS ?  ?

## 2021-08-30 NOTE — Progress Notes (Signed)
Occupational Therapy Session Note ? ?Patient Details  ?Name: Edward Hopkins ?MRN: [id number] ?Date of Birth: 04-07-1951 ? ?Today's Date: 08/30/2021 ?OT Individual Time: 3546-5681 ?OT Individual Time Calculation (min): 73 min  ? ? ?Short Term Goals: ?Week 3:  OT Short Term Goal 1 (Week 3): sitting EOB, pt will don tshirt with min A. ?OT Short Term Goal 2 (Week 3): Pt will transfer to [facility] with min A using LRAD ?OT Short Term Goal 3 (Week 3): Pt will perform toileting tasks with mod A ?OT Short Term Goal 4 (Week 3): Pt will complete bathing tasks with mod A seated EOB or in w/c ? ?Skilled Therapeutic Interventions/Progress Updates:  ?  Pt resting in bed upon arrival. IV disconnected. Pt agreeable to getting OOB and bathing at shower level. Supine>sit EOB with supervision using bed rails. Sit<>stand and SPT to rolling shower chair with min A. Bathing with sit<>stand from shower chair with mod A for bathing and min A for standing balance when pt bathed buttocks. Pt returned to room and performed sit>stand, step, pivot to EOB. Sit>supine with supervision. Pt remained in bed with all needs within reach and bed alarm activated. Pt required more then a reasonable amount of time to complete tasks. O2>92% on 2L O2 throughout session.  ? ?Therapy Documentation ?Precautions:  ?Precautions ?Precautions: Fall ?Precaution Comments: mild R hemipareisis, 2L O2, slow to initiate ?Restrictions ?Weight Bearing Restrictions: No ?RLE Weight Bearing: Weight bearing as tolerated ?  ?Pain: ? Pt c/o Rt ankle tenderness to touch but able to WB when standing ? ? ?Therapy/Group: Individual Therapy ? ?[name] ?08/30/2021, 10:14 AM ?

## 2021-08-30 NOTE — Progress Notes (Addendum)
Pt request suppository. Declined enema. Pt tolerated well.   ?

## 2021-08-30 NOTE — Progress Notes (Signed)
?                                                       PROGRESS NOTE ? ? ?Subjective/Complaints: ? ?Pt reports slept all day yesterday- wasn't able to get shower- wants one today- d/w OT.  ?Decided to not take pain meds overnight and today so can stay awake better.  ?Not eating/drinking well-IVFs still hanging- will continue them.  ? ?Also urine malodorous per nursing- stronglyy.  ?No BM even with sorbitol yesterday.  ? ? ? ?  ?ROS:  ? ?Pt denies SOB, abd pain, CP, N/V/(+) C/D, and vision changes ? ? ?Objective: ?  ?DG Ankle 2 Views Right ? ?Result Date: 08/29/2021 ?CLINICAL DATA:  Right ankle swelling. EXAM: RIGHT ANKLE - 2 VIEW COMPARISON:  None. FINDINGS: There is no evidence of fracture, dislocation, or joint effusion. There is no evidence of arthropathy or other focal bone abnormality. Mild diffuse soft tissue swelling. IMPRESSION: 1. Mild diffuse soft tissue swelling. No acute osseous abnormality. Electronically Signed   By: Titus Dubin M.D.   On: 08/29/2021 08:10   ?Recent Labs  ?  08/29/21 ?0533  ?WBC 8.4  ?HGB 10.0*  ?HCT 31.3*  ?PLT 254  ? ? ?Recent Labs  ?  08/29/21 ?4315 08/30/21 ?4008  ?NA 135 134*  ?K 4.8 4.8  ?CL 98 96*  ?CO2 29 29  ?GLUCOSE 103* 108*  ?BUN 64* 68*  ?CREATININE 2.94* 2.78*  ?CALCIUM 9.3 9.0  ? ? ? ? ?Intake/Output Summary (Last 24 hours) at 08/30/2021 1007 ?Last data filed at 08/30/2021 0500 ?Gross per 24 hour  ?Intake 1572.03 ml  ?Output 1075 ml  ?Net 497.03 ml  ?  ? ?  ? ?Physical Exam: ?Vital Signs ?Blood pressure 114/69, pulse 71, temperature 98.1 ?F (36.7 ?C), resp. rate 18, height 5\' 8"  (1.727 m), weight 80.2 kg, SpO2 100 %. ? ? ? ?General: awake, alert, appropriate, wearing o2 by [place]; NAD ?HENT: conjugate gaze; oropharynx moist ?CV: regular rate; no JVD ?Pulmonary: CTA B/L; no W/R/R- good air movement ?GI: soft, NT, ND, (+)BS- hypoactive ?Psychiatric: appropriate- still mumbles some; flat ?Neurological: Ox3- more awake; interactive today ?MS: R ankle mild swelling- TTP medial  and lateral malleolus ?Motor: LUE 5/5 ?RUE 4+/5 delt bi, tri grip ?LLE- 5/5 ?RLE- 5-/5- weaker on R side, stable ? ?Assessment/Plan: ?1. Functional deficits which require 3+ hours per day of interdisciplinary therapy in a comprehensive inpatient rehab setting. ?Physiatrist is providing close team supervision and 24 hour management of active medical problems listed below. ?Physiatrist and rehab team continue to assess barriers to discharge/monitor patient progress toward functional and medical goals ? ?Care Tool: ? ?Bathing ?   ?Body parts bathed by patient: Chest, Abdomen  ? Body parts bathed by helper: Buttocks, Right upper leg, Left upper leg, Left lower leg, Right lower leg ?  ?  ?Bathing assist Assist Level: Moderate Assistance - Patient 50 - 74% ?  ?  ?Upper Body Dressing/Undressing ?Upper body dressing   ?What is the patient wearing?: Pull over shirt ?   ?Upper body assist Assist Level: Minimal Assistance - Patient > 75% ?   ?Lower Body Dressing/Undressing ?Lower body dressing ? ? ?   ?What is the patient wearing?: Incontinence brief, Pants ? ?  ? ?Lower body assist Assist for lower body dressing:  Moderate Assistance - Patient 50 - 74% ?   ? ?Toileting ?Toileting    ?Toileting assist Assist for toileting: 2 Helpers ?  ?  ?Transfers ?Chair/bed transfer ? ?Transfers assist ?   ? ?Chair/bed transfer assist level: Contact Guard/Touching assist ?Chair/bed transfer assistive device: Walker ?  ?Locomotion ?Ambulation ? ? ?Ambulation assist ? ? Ambulation activity did not occur: Safety/medical concerns ? ?Assist level: Contact Guard/Touching assist ?Assistive device: Walker-rolling ?Max distance: 15  ? ?Walk 10 feet activity ? ? ?Assist ? Walk 10 feet activity did not occur: Safety/medical concerns ? ?Assist level: Contact Guard/Touching assist ?Assistive device: Walker-rolling  ? ?Walk 50 feet activity ? ? ?Assist Walk 50 feet with 2 turns activity did not occur: Safety/medical concerns ? ?  ?   ? ? ?Walk 150 feet  activity ? ? ?Assist Walk 150 feet activity did not occur: Safety/medical concerns ? ?  ?  ?  ? ?Walk 10 feet on uneven surface  ?activity ? ? ?Assist Walk 10 feet on uneven surfaces activity did not occur: Safety/medical concerns ? ? ?  ?   ? ?Wheelchair ? ? ? ? ?Assist Is the patient using a wheelchair?: Yes ?Type of Wheelchair: Manual ?  ? ?Wheelchair assist level: Dependent - Patient 0% ?   ? ? ?Wheelchair 50 feet with 2 turns activity ? ? ? ?Assist ? ?  ?Wheelchair 50 feet with 2 turns activity did not occur: Safety/medical concerns ? ? ?   ? ?Wheelchair 150 feet activity  ? ? ? ?Assist ? Wheelchair 150 feet activity did not occur: Safety/medical concerns ? ? ?   ? ?Blood pressure 114/69, pulse 71, temperature 98.1 ?F (36.7 ?C), resp. rate 18, height 5\' 8"  (1.727 m), weight 80.2 kg, SpO2 100 %. ? ?Medical Problem List and Plan: ?1. Functional deficits secondary to debility from  thoracic dissection of aneurysm  ?           vascular says pt healing well- CT of chest, abdomen and pelvis  at 4 weeks post op ? 4/17- con't PT and OT- shower today- d/w OT- team conference today to f/u on progress ?2.  Antithrombotics: ?-DVT/anticoagulation:  Pharmaceutical: Lovenox.  ?            -antiplatelet therapy: N/A ?3. Back pain: add kpad. continue oxycodone prn  ? 4/4- having a lot of lower chest and back pain- con't pain meds- will add lidocaine patches for night time for back pain.  ? 4/17- would like [place] to reduce pain meds/oxycodone- will change to 2.5 mg to 5 mg q4 hours prn esp for R ankle pain ? 4/18- holding meds, per pt- due to sedation.  ?4. Mood: LCSW to follow for evaluation and support.  ?            -antipsychotic agents: N/A ?5. Neuropsych: This patient may be  capable of making decisions on his own behalf. ?6. Skin/Wound Care: Routine pressure relief measures.  Monitor areas on right forearm/wrist for demarcation ?            -will add Vitamin C and Zinc to promote healing.  ?7. Fluids/Electrolytes/Nutrition:  Monitor I/Os ?8. Ruptured  descending thoracic aneurysm s/p repair: BP management.  ?            --will need repeat CTA 4 weeks (monitoring SCr  for improvement) ? 4.17- will need CT of Chest/abd/pelvis at 4 weeks ?9. Acute on chronic renal failure: with new CKD Stage IV Improvement in BUN/SCr-  39/2.38 ?            --monitor with serial checks. Avoid nephrotoxic medications.  ? 3/31- Cr and BUN stable con't to monitor weekly.  ? 4/3- Cr still 2.3-2.4- is stable- per Vascular wants pt kidney function to improve some, before CTA< however need to check for PE/so will do both ? 4/6- labs in AM ? 4/7- Cr 2.46- very slightly increased- will con't regimen ? Creatinine 2.49 on 4/11, stable ? 4/17- Cr up to 2.94- and BUN up to 60 from 40- will give IVFs NS 75cc/hour x 24 hours- pt's intake poor. Will recheck BMP in AM  ? 4/18- Cr down to 2.78- but BUN up slightly- to 68- will continue IVFs today for another 24 hours and recheck in AM ?10. Acute blood loss anemia: Hgb reviewed and improving, monitor weekly.  ? Hemoglobin 9.3 on 4/11 ? 4/18- Hb up to 10 yesterday- will recheck in AM due to elevated BUN - making sure doesn't have GI bleed? Unlikely, but want to make sure not dropping ?11. Hypoxia: continue oxygen via nasal cannula as needed  ?            --Off diuresis. Resp rate increase with minimal movement- will monitor O2 sats.  ? 3/31- monitor closely, since  SOB with minimal exertion. ?4/3- still requiring O2- will check for PE this evening- since pt's still has DOE.  ?4/4- Couldn't do CTA- so ordered VQ scabn- pending.   ?4/5- has PE- but no DVT- has filter from 1995- d/w pulmonary- risk of AC is worse than no AC- will hold off- due to high risk of bleeding profusely per vascular-  ?Asking palliative care to help with goals.  ? 4/6- pt wants to be full code right now palliative care ot see again today- family coming today.  ?4/7- O2 sats lower this AM- on RA- needed to be put back on O2 ?4/17- pt's sats good on 2L  O2 ?12. ABLA: Improving with Epogen and thrombocytopenia has resolved.  Continue to monitor as needed.  ?13. Polysubstance abuse: Educated on importance of cessation ?            --smokes > 1 PPD and mariju

## 2021-08-30 NOTE — Patient Care Conference (Signed)
Inpatient RehabilitationTeam Conference and Plan of Care Update ?Date: 08/30/2021   Time: 11:04 AM  ? ? ?Patient Name: Edward Hopkins      ?Medical Record Number: [id number]  ?Date of Birth: 03/17/1951 ?Sex: Male         ?Room/Bed: 0J62E/3M62H-47 ?Payor Info: Payor: [employer] / Plan: [facility] MEDICARE / Product Type: *No Product type* /   ? ?Admit Date/Time:  08/11/2021  5:23 PM ? ?Primary Diagnosis:  Intramural aortic hematoma (HCC) ? ?Hospital Problems: Principal Problem: ?  Intramural aortic hematoma ([place]) ?Active Problems: ?  Aortic aneurysm rupture ([place]) ?  Chronic kidney disease (CKD), stage IV (severe) (HCC) ?  Debility ?  Cerebral embolism with cerebral infarction ?  Goals of care, counseling/discussion ?  Palliative care by specialist ?  Supplemental oxygen dependent ? ? ? ?Expected Discharge Date: Expected Discharge Date:  (SNF) ? ?Team Members Present: ?Physician leading conference: Dr. Courtney Heys ?Social Worker Present: Erlene Quan, BSW ?Nurse Present: Dorthula Nettles, RN ?PT Present: Excell Seltzer, PT ?OT Present: Roanna Epley, Claris Gladden, OT ?PPS Coordinator present : Gunnar Fusi, SLP ? ?   Current Status/Progress Goal Weekly Team Focus  ?Bowel/Bladder ? ? continent bladder, incontinent bowel, LBM 4/14, Sorbitol ordered with Enema ordered if no results  regain continence  toilet q 2hr and prn   ?Swallow/Nutrition/ Hydration ? ?           ?ADL's ? ? bed mobility-mod A; sitting balance-min A/mod A; funcitonal tranfsers-min A/mod A; limited participation; bathing-mod A/max A w/c and bed level  min A overall (to be downgraded)  activity tolerance, be dmobility, sitting balance, BADLs   ?Mobility ? ? Supervision to min A bed mobility, CGA to min A sit to stand and transfers with RW, gait about 15 ft with RW min A  downgraded to min A overall  sitting balance, transfers, gait, endurance   ?Communication ? ?           ?Safety/Cognition/ Behavioral Observations ?            ?Pain ? ? 5/10 right ankle pain  < 3  assess pain q 4hr and prn   ?Skin ? ? CDI  no new breakdown  assess skin q shift and prn   ? ? ?Discharge Planning:  ?D/c to home with s/o [name] who can provide supervision, and reports there is a health aide, hours vary, about 15 hours a week. SW will confirm there are no barriers to discharge.   ?Team Discussion: ?UA/CS ordered, awaiting results. Malodorous urine. BUN increased from 40 to 68, Cr increased from 2.49 to 2.94, decreased to 2.78 today. IVF ordered. Not eating well. Placed on night bath list.discontinuing Oxt IR, ordered Tramadol. Continent bladder, incontinent bowel, LBM 4/14. Sorbitol ordered, if no progress, enema ordered. Reports 5/10 right ankle pain, xray negative for fracture. Discharging to SNF. ? ?Patient on target to meet rehab goals: ?yes, min/mod assist goals. OT downgraded goals. Doing better this week. CGA transfers. Mod assist lower body. ? ?*See Care Plan and progress notes for long and short-term goals.  ? ?Revisions to Treatment Plan:  ?Adjusting medications, downgrading therapy goals. ?  ?Teaching Needs: ?Family education, medication/pain management, safety awareness, transfer/gait training, etc. ?  ?Current Barriers to Discharge: ?Decreased caregiver support, Home enviroment access/layout, Incontinence, Lack of/limited family support, and Weight ? ?Possible Resolutions to Barriers: ?Family education ?SNF placement ?Order recommended DME ?  ? ? Medical Summary ?Current Status: no BM in 4 days- R  ankle xray only (+) for swelling- likely sprain- incontinent of bowel- conitnent of bladder- pain 5/10 in R ankle-sedation better with holding pain meds ? Barriers to Discharge: Behavior;Decreased family/caregiver support;Home enviroment access/layout;Incontinence;Neurogenic Bowel & Bladder;Medical stability;Weight;Nutrition means ?  ?Possible Resolutions to [employer] Focus: transfers CGA- min A-sorbitol for constipation- and enema if needed-  holding pain meds for R ankle- will change to Tramadol - d/c SNF ? ? ?Continued Need for Acute Rehabilitation Level of Care: The patient requires daily medical management by a physician with specialized training in physical medicine and rehabilitation for the following reasons: ?Direction of a multidisciplinary physical rehabilitation program to maximize functional independence : Yes ?Medical management of patient stability for increased activity during participation in an intensive rehabilitation regime.: Yes ?Analysis of laboratory values and/or radiology reports with any subsequent need for medication adjustment and/or medical intervention. : Yes ? ? ?I attest that I was present, lead the team conference, and concur with the assessment and plan of the team. ? ? ?Dorthula Nettles G ?08/30/2021, 2:55 PM  ? ? ? ? ? ? ?

## 2021-08-31 ENCOUNTER — Inpatient Hospital Stay (HOSPITAL_COMMUNITY): Payer: Medicare Other

## 2021-08-31 DIAGNOSIS — N390 Urinary tract infection, site not specified: Secondary | ICD-10-CM

## 2021-08-31 DIAGNOSIS — K5900 Constipation, unspecified: Secondary | ICD-10-CM

## 2021-08-31 DIAGNOSIS — R0989 Other specified symptoms and signs involving the circulatory and respiratory systems: Secondary | ICD-10-CM

## 2021-08-31 LAB — CBC WITH DIFFERENTIAL/PLATELET
Abs Immature Granulocytes: 0.03 10*3/uL (ref 0.00–0.07)
Basophils Absolute: 0 10*3/uL (ref 0.0–0.1)
Basophils Relative: 0 %
Eosinophils Absolute: 0.5 10*3/uL (ref 0.0–0.5)
Eosinophils Relative: 7 %
HCT: 30.4 % — ABNORMAL LOW (ref 39.0–52.0)
Hemoglobin: 9.5 g/dL — ABNORMAL LOW (ref 13.0–17.0)
Immature Granulocytes: 0 %
Lymphocytes Relative: 39 %
Lymphs Abs: 3.1 10*3/uL (ref 0.7–4.0)
MCH: 26.8 pg (ref 26.0–34.0)
MCHC: 31.3 g/dL (ref 30.0–36.0)
MCV: 85.6 fL (ref 80.0–100.0)
Monocytes Absolute: 0.8 10*3/uL (ref 0.1–1.0)
Monocytes Relative: 10 %
Neutro Abs: 3.4 10*3/uL (ref 1.7–7.7)
Neutrophils Relative %: 44 %
Platelets: 222 10*3/uL (ref 150–400)
RBC: 3.55 MIL/uL — ABNORMAL LOW (ref 4.22–5.81)
RDW: 14.9 % (ref 11.5–15.5)
WBC: 7.9 10*3/uL (ref 4.0–10.5)
nRBC: 0 % (ref 0.0–0.2)

## 2021-08-31 LAB — BASIC METABOLIC PANEL
Anion gap: 8 (ref 5–15)
BUN: 63 mg/dL — ABNORMAL HIGH (ref 8–23)
CO2: 25 mmol/L (ref 22–32)
Calcium: 8.8 mg/dL — ABNORMAL LOW (ref 8.9–10.3)
Chloride: 100 mmol/L (ref 98–111)
Creatinine, Ser: 2.33 mg/dL — ABNORMAL HIGH (ref 0.61–1.24)
GFR, Estimated: 29 mL/min — ABNORMAL LOW (ref >60)
Glucose, Bld: 103 mg/dL — ABNORMAL HIGH (ref 70–99)
Potassium: 4.6 mmol/L (ref 3.5–5.1)
Sodium: 133 mmol/L — ABNORMAL LOW (ref 135–145)

## 2021-08-31 LAB — GLUCOSE, CAPILLARY: Glucose-Capillary: 103 mg/dL — ABNORMAL HIGH (ref 70–99)

## 2021-08-31 MED ORDER — AMOXICILLIN 250 MG PO CAPS
500.0000 mg | ORAL_CAPSULE | Freq: Three times a day (TID) | ORAL | Status: DC
  Administered 2021-08-31 – 2021-09-05 (×16): 500 mg via ORAL
  Filled 2021-08-31 (×16): qty 2

## 2021-08-31 MED ORDER — MELATONIN 3 MG PO TABS: 3.0000 mg | ORAL_TABLET | Freq: Every day | ORAL | 0 refills | Status: DC

## 2021-08-31 MED ORDER — MIRTAZAPINE 15 MG PO TABS
15.0000 mg | ORAL_TABLET | Freq: Every day | ORAL | Status: DC
Start: 2021-08-31 — End: 2022-06-02

## 2021-08-31 MED ORDER — JUVEN PO PACK: 1.0000 | PACK | Freq: Two times a day (BID) | ORAL | 0 refills | Status: DC

## 2021-08-31 MED ORDER — SENNA 8.6 MG PO TABS
2.0000 | ORAL_TABLET | Freq: Every day | ORAL | 0 refills | Status: DC
Start: 2021-09-01 — End: 2022-06-02

## 2021-08-31 MED ORDER — ZINC SULFATE 220 (50 ZN) MG PO CAPS: 220.0000 mg | ORAL_CAPSULE | Freq: Every day | ORAL | Status: DC

## 2021-08-31 MED ORDER — SIMETHICONE 80 MG PO CHEW: 80.0000 mg | CHEWABLE_TABLET | Freq: Four times a day (QID) | ORAL | 0 refills | Status: DC

## 2021-08-31 MED ORDER — IPRATROPIUM-ALBUTEROL 0.5-2.5 (3) MG/3ML IN SOLN: 3.0000 mL | RESPIRATORY_TRACT | Status: DC | PRN

## 2021-08-31 MED ORDER — METOCLOPRAMIDE HCL 5 MG PO TABS: 5.0000 mg | ORAL_TABLET | Freq: Two times a day (BID) | ORAL | Status: DC

## 2021-08-31 MED ORDER — HYDROCERIN EX CREA: 1.0000 "application " | TOPICAL_CREAM | Freq: Two times a day (BID) | CUTANEOUS | 0 refills | Status: DC

## 2021-08-31 MED ORDER — BISACODYL 10 MG RE SUPP
10.0000 mg | Freq: Every day | RECTAL | 0 refills | Status: DC
Start: 2021-09-01 — End: 2022-06-02

## 2021-08-31 MED ORDER — ASCORBIC ACID 500 MG PO TABS: 500.0000 mg | ORAL_TABLET | Freq: Two times a day (BID) | ORAL | Status: DC

## 2021-08-31 MED ORDER — SORBITOL 70 % SOLN
45.0000 mL | Freq: Once | Status: AC
  Administered 2021-08-31: 45 mL via ORAL
  Filled 2021-08-31: qty 60

## 2021-08-31 MED ORDER — TRAZODONE HCL 50 MG PO TABS: 25.0000 mg | ORAL_TABLET | Freq: Every evening | ORAL | Status: DC | PRN

## 2021-08-31 MED ORDER — ASPIRIN 81 MG PO TBEC: 81.0000 mg | DELAYED_RELEASE_TABLET | Freq: Every day | ORAL | 11 refills | Status: AC

## 2021-08-31 NOTE — Progress Notes (Signed)
?                                                       PROGRESS NOTE ? ? ?Subjective/Complaints: ? ?No BM since 4/14- KUB shows moderate stool burden- but no obstruction or ileus.  ? ?Said he received enema yesterday-  ?Per nursing, not drinking well.  ? ?  ?ROS:  ? ? ?Pt denies SOB, abd pain, CP, N/V/(+) C/D, and vision changes ? ? ?Objective: ?  ?DG Abd 1 View ? ?Result Date: 08/31/2021 ?CLINICAL DATA:  Constipation EXAM: ABDOMEN - 1 VIEW COMPARISON:  Abdominal radiograph dated August 13, 2021 FINDINGS: Nonobstructive bowel gas pattern with moderate stool burden. Aortic vascular stents. IVC filter. Advanced degenerative changes of the bilateral hips. IMPRESSION: Nonobstructive bowel gas pattern with moderate stool burden. Electronically Signed   By: Yetta Glassman M.D.   On: 08/31/2021 14:58   ?Recent Labs  ?  08/29/21 ?5009 08/31/21 ?0532  ?WBC 8.4 7.9  ?HGB 10.0* 9.5*  ?HCT 31.3* 30.4*  ?PLT 254 222  ? ? ?Recent Labs  ?  08/30/21 ?3818 08/31/21 ?0532  ?NA 134* 133*  ?K 4.8 4.6  ?CL 96* 100  ?CO2 29 25  ?GLUCOSE 108* 103*  ?BUN 68* 63*  ?CREATININE 2.78* 2.33*  ?CALCIUM 9.0 8.8*  ? ? ? ? ?Intake/Output Summary (Last 24 hours) at 08/31/2021 1835 ?Last data filed at 08/31/2021 1318 ?Gross per 24 hour  ?Intake 900 ml  ?Output 0 ml  ?Net 900 ml  ?  ? ?  ? ?Physical Exam: ?Vital Signs ?Blood pressure 131/76, pulse 66, temperature 97.8 ?F (36.6 ?C), resp. rate 18, height 5\' 8"  (1.727 m), weight 80.2 kg, SpO2 100 %. ? ? ? ?General: awake, alert, appropriate, supine in bed; weight back up 2+ kg; NAD ?HENT: conjugate gaze; oropharynx dry but asked pt to drink; ?CV: regular rate; no JVD ?Pulmonary: CTA B/L; no W/R/R- good air movement ?GI: soft, NT, ND, (+)BS ?Psychiatric: appropriate but very depressed- kept saying "you're kicking me out today".  ?Neurological: more sleepy- but overall alert ?MS: R ankle mild swelling- TTP medial and lateral malleolus ?Motor: LUE 5/5 ?RUE 4+/5 delt bi, tri grip ?LLE- 5/5 ?RLE- 5-/5- weaker  on R side, stable ? ?Assessment/Plan: ?1. Functional deficits which require 3+ hours per day of interdisciplinary therapy in a comprehensive inpatient rehab setting. ?Physiatrist is providing close team supervision and 24 hour management of active medical problems listed below. ?Physiatrist and rehab team continue to assess barriers to discharge/monitor patient progress toward functional and medical goals ? ?Care Tool: ? ?Bathing ?   ?Body parts bathed by patient: Right arm, Left arm, Chest, Abdomen, Front perineal area, Buttocks, Right upper leg, Left upper leg, Face  ? Body parts bathed by helper: Right lower leg, Left lower leg ?  ?  ?Bathing assist Assist Level: Moderate Assistance - Patient 50 - 74% ?  ?  ?Upper Body Dressing/Undressing ?Upper body dressing   ?What is the patient wearing?: [place] only ?   ?Upper body assist Assist Level: Total Assistance - Patient < 25% ?   ?Lower Body Dressing/Undressing ?Lower body dressing ? ? ?   ?What is the patient wearing?: Incontinence brief ? ?  ? ?Lower body assist Assist for lower body dressing: Total Assistance - Patient < 25% ?   ? ?  Toileting ?Toileting    ?Toileting assist Assist for toileting: 2 Helpers ?  ?  ?Transfers ?Chair/bed transfer ? ?Transfers assist ?   ? ?Chair/bed transfer assist level: Minimal Assistance - Patient > 75% ?Chair/bed transfer assistive device: Walker ?  ?Locomotion ?Ambulation ? ? ?Ambulation assist ? ? Ambulation activity did not occur: Safety/medical concerns ? ?Assist level: Minimal Assistance - Patient > 75% ?Assistive device: Walker-rolling ?Max distance: 5'  ? ?Walk 10 feet activity ? ? ?Assist ? Walk 10 feet activity did not occur: Safety/medical concerns ? ?Assist level: Contact Guard/Touching assist ?Assistive device: Walker-rolling  ? ?Walk 50 feet activity ? ? ?Assist Walk 50 feet with 2 turns activity did not occur: Safety/medical concerns ? ?  ?   ? ? ?Walk 150 feet activity ? ? ?Assist Walk 150 feet activity did not  occur: Safety/medical concerns ? ?  ?  ?  ? ?Walk 10 feet on uneven surface  ?activity ? ? ?Assist Walk 10 feet on uneven surfaces activity did not occur: Safety/medical concerns ? ? ?  ?   ? ?Wheelchair ? ? ? ? ?Assist Is the patient using a wheelchair?: Yes ?Type of Wheelchair: Manual ?  ? ?Wheelchair assist level: Dependent - Patient 0% ?   ? ? ?Wheelchair 50 feet with 2 turns activity ? ? ? ?Assist ? ?  ?Wheelchair 50 feet with 2 turns activity did not occur: Safety/medical concerns ? ? ?   ? ?Wheelchair 150 feet activity  ? ? ? ?Assist ? Wheelchair 150 feet activity did not occur: Safety/medical concerns ? ? ?   ? ?Blood pressure 131/76, pulse 66, temperature 97.8 ?F (36.6 ?C), resp. rate 18, height 5\' 8"  (1.727 m), weight 80.2 kg, SpO2 100 %. ? ?Medical Problem List and Plan: ?1. Functional deficits secondary to debility from  thoracic dissection of aneurysm  ?           vascular says pt healing well- CT of chest, abdomen and pelvis  at 4 weeks post op ? 4/19- con't PT and OT- CIR- d/c to SNF when medically appropriate- getting there ?2.  Antithrombotics: ?-DVT/anticoagulation:  Pharmaceutical: Lovenox.  ?            -antiplatelet therapy: N/A ?3. Back pain: add kpad. continue oxycodone prn  ? 4/4- having a lot of lower chest and back pain- con't pain meds- will add lidocaine patches for night time for back pain.  ? 4/17- would like [place] to reduce pain meds/oxycodone- will change to 2.5 mg to 5 mg q4 hours prn esp for R ankle pain ? 4/18- holding meds, per pt- due to sedation. ?4/19- changed oxycodone to tramadol 50 mg BID prn  ?4. Mood: LCSW to follow for evaluation and support.  ?            -antipsychotic agents: N/A ?5. Neuropsych: This patient may be  capable of making decisions on his own behalf. ?6. Skin/Wound Care: Routine pressure relief measures.  Monitor areas on right forearm/wrist for demarcation ?            -will add Vitamin C and Zinc to promote healing.  ?7. Fluids/Electrolytes/Nutrition:  Monitor I/Os ?8. Ruptured  descending thoracic aneurysm s/p repair: BP management.  ?            --will need repeat CTA 4 weeks (monitoring SCr  for improvement) ? 4.17- will need CT of Chest/abd/pelvis at 4 weeks ?9. Acute on chronic renal failure: with new CKD Stage IV Improvement  in BUN/SCr- 39/2.38 ?            --monitor with serial checks. Avoid nephrotoxic medications.  ? 3/31- Cr and BUN stable con't to monitor weekly.  ? 4/3- Cr still 2.3-2.4- is stable- per Vascular wants pt kidney function to improve some, before CTA< however need to check for PE/so will do both ? 4/6- labs in AM ? 4/7- Cr 2.46- very slightly increased- will con't regimen ? Creatinine 2.49 on 4/11, stable ? 4/17- Cr up to 2.94- and BUN up to 60 from 40- will give IVFs NS 75cc/hour x 24 hours- pt's intake poor. Will recheck BMP in AM  ? 4/18- Cr down to 2.78- but BUN up slightly- to 68- will continue IVFs today for another 24 hours and recheck in AM ? 4/19- Cr down to 2.33 and BUN 63- slightly improved- stop IVFs and recheck this weekend.  ?10. Acute blood loss anemia: Hgb reviewed and improving, monitor weekly.  ? Hemoglobin 9.3 on 4/11 ? 4/18- Hb up to 10 yesterday- will recheck in AM due to elevated BUN - making sure doesn't have GI bleed? Unlikely, but want to make sure not dropping ? 4/19- Hb 9.5- stable and even with hydration ?11. Hypoxia: continue oxygen via nasal cannula as needed  ?            --Off diuresis. Resp rate increase with minimal movement- will monitor O2 sats.  ? 3/31- monitor closely, since  SOB with minimal exertion. ?4/3- still requiring O2- will check for PE this evening- since pt's still has DOE.  ?4/4- Couldn't do CTA- so ordered VQ scabn- pending.   ?4/5- has PE- but no DVT- has filter from 1995- d/w pulmonary- risk of AC is worse than no AC- will hold off- due to high risk of bleeding profusely per vascular-  ?Asking palliative care to help with goals.  ? 4/6- pt wants to be full code right now palliative care  ot see again today- family coming today.  ?4/7- O2 sats lower this AM- on RA- needed to be put back on O2 ?4/17- pt's sats good on 2L O2 ?12. ABLA: Improving with Epogen and thrombocytopenia has resolved.

## 2021-08-31 NOTE — Discharge Summary (Signed)
Physician Discharge Summary  ?Patient ID: ?Edward Hopkins ?MRN: [id number] ?DOB/AGE: 09/21/1950 71 y.o. ? ?Admit date: 08/11/2021 ?Discharge date: 09/07/2021 ? ?Discharge Diagnoses:  ?Principal Problem: ?  Intramural aortic hematoma (HCC) ?Active Problems: ?  Aortic aneurysm rupture ([place]) ?  Chronic kidney disease (CKD), stage IV (severe) (HCC) ?  Debility ?  Cerebral embolism with cerebral infarction ?  Goals of care, counseling/discussion ?  Palliative care by specialist ?  Supplemental oxygen dependent ?  Constipation ?  E. coli UTI ?  Suspected pulmonary embolism ? ? ?Discharged Condition: stable ? ?Significant Diagnostic Studies: ?DG Ankle 2 Views Right ? ?Result Date: 08/29/2021 ?CLINICAL DATA:  Right ankle swelling. EXAM: RIGHT ANKLE - 2 VIEW COMPARISON:  None. FINDINGS: There is no evidence of fracture, dislocation, or joint effusion. There is no evidence of arthropathy or other focal bone abnormality. Mild diffuse soft tissue swelling. IMPRESSION: 1. Mild diffuse soft tissue swelling. No acute osseous abnormality. Electronically Signed   By: Titus Dubin M.D.   On: 08/29/2021 08:10  ? ?DG Abd 1 View ? ?Result Date: 08/31/2021 ?CLINICAL DATA:  Constipation EXAM: ABDOMEN - 1 VIEW COMPARISON:  Abdominal radiograph dated August 13, 2021 FINDINGS: Nonobstructive bowel gas pattern with moderate stool burden. Aortic vascular stents. IVC filter. Advanced degenerative changes of the bilateral hips. IMPRESSION: Nonobstructive bowel gas pattern with moderate stool burden. Electronically Signed   By: Yetta Glassman M.D.   On: 08/31/2021 14:58  ? ?DG Abd 1 View ? ?Result Date: 08/11/2021 ?CLINICAL DATA:  Left lower quadrant pain/ileus EXAM: ABDOMEN - 1 VIEW COMPARISON:  X-ray abdomen 08/05/2021 FINDINGS: Likely improved gaseous abdomen with loops of small and large bowel distended with gas. Thoracic and abdominal aortic stent. Inferior vena cava filter. No radio-opaque calculi or other significant radiographic abnormality  are seen. Severe degenerative changes of bilateral hips. IMPRESSION: Likely improved gaseous abdomen with loops of small and large bowel distended with gas. Electronically Signed   By: Iven Finn M.D.   On: 08/11/2021 20:20  ? ?MR BRAIN WO CONTRAST ? ?Result Date: 08/12/2021 ?CLINICAL DATA:  Neuro deficit, acute, stroke suspected. EXAM: MRI HEAD WITHOUT CONTRAST TECHNIQUE: Multiplanar, multiecho pulse sequences of the brain and surrounding structures were obtained without intravenous contrast. COMPARISON:  Head CT August 05, 2021. FINDINGS: Brain: One small focus of restricted diffusion in the inferior left cerebellar hemisphere and 2 in the deep white matter of the left frontal lobe, consistent with acute/subacute infarcts remote lacunar infarct in the left basal ganglia. Scattered and confluent foci of T2 hyperintensity are seen within the white matter of the cerebral hemispheres, nonspecific, most likely related to chronic small vessel ischemia. No hemorrhage, hydrocephalus, extra-axial collection or mass lesion. Vascular: Normal flow voids. Skull and upper cervical spine: Normal marrow signal. Sinuses/Orbits: No acute findings. Other: None. IMPRESSION: 1. Three small foci of restricted diffusion consistent with acute/subacute infarcts, 2 in the left frontal lobe and 1 in the cerebellum, suggesting embolic event. 2. Moderate chronic microvascular ischemic changes of the white matter. Electronically Signed   By: Pedro Earls M.D.   On: 08/12/2021 15:28  ? ?NM Pulmonary Perfusion ? ?Result Date: 08/16/2021 ?CLINICAL DATA:  Chest pain. Short of breath. Concern for pulmonary embolism. EXAM: NUCLEAR MEDICINE PERFUSION LUNG SCAN TECHNIQUE: Perfusion images were obtained in multiple projections after intravenous injection of radiopharmaceutical. RADIOPHARMACEUTICALS:  5.1 mCi Tc-58m MAA COMPARISON:  Chest radiograph 08/15/2021 FINDINGS: There is a wedge-shaped peripheral perfusion defect within the  middle segment of the RIGHT  middle lobe. No corresponding abnormality on comparison chest radiograph. Regional decreased perfusion in the LEFT lower lobe is favored related to effusion and cardiomegaly on radiograph. IMPRESSION: Concern for acute pulmonary embolism to the RIGHT middle lobe. Electronically Signed   By: Suzy Bouchard M.D.   On: 08/16/2021 12:31  ? ? ?DG Abd Portable 1V ? ?Result Date: 08/13/2021 ?CLINICAL DATA:  Lower abdominal pain and distension. EXAM: PORTABLE ABDOMEN - 1 VIEW COMPARISON:  08/11/2021 FINDINGS: Signs of previous stent graft repair of the thoracic and proximal abdominal aorta. IVC filter is identified. Stent is noted within the infrarenal abdominal aorta. Bowel gas pattern is nonobstructed. No dilated loops of small bowel. Colonic distension appears improved from previous exam. IMPRESSION: Improving gaseous distension of the colon. Electronically Signed   By: Kerby Moors M.D.   On: 08/13/2021 10:14  ? ?US BREAST LTD UNI LEFT INC AXILLA ? ?Result Date: 08/17/2021 ?CLINICAL DATA:  LEFT breast pain. EXAM: ULTRASOUND OF THE LEFT BREAST COMPARISON:  None. FINDINGS: Targeted ultrasound is performed, showing normal appearing fibroglandular tissue in the 2 o'clock, 3 o'clock, and 4 o'clock locations of the LEFT breast. No suspicious mass, distortion, or acoustic shadowing is demonstrated with ultrasound. IMPRESSION: There is no sonographic evidence for malignancy. RECOMMENDATION: Recommend correlation with physical exam. If there is a palpable mass, recommend mammogram and ultrasound at a dedicated breast imaging facility. I have discussed the findings and recommendations with the patient. If applicable, a reminder letter will be sent to the patient regarding the next appointment. BI-RADS CATEGORY  1: Negative. Electronically Signed   By: Nolon Nations M.D.   On: 08/17/2021 13:14  ? ?ECHOCARDIOGRAM COMPLETE ? ?Result Date: 08/16/2021 ?   ECHOCARDIOGRAM REPORT   Patient Name:   Edward Hopkins Date of Exam: 08/16/2021 Medical Rec #:  [id number]     Height:       68.0 in Accession #:    [id number]    Weight:       191.4 lb Date of Birth:  22-May-1950     BSA:          2.006 m? Patient Age:    60 years      BP:           121/63 mmHg Patient Gender: M             HR:           68 bpm. Exam Location:  Inpatient Procedure: 2D Echo STAT ECHO Indications:    pulmonary embolus  History:        Patient has prior history of Echocardiogram examinations, most                 recent 08/02/2021. Ascending aortic root replacement, chronic                 kidney disease; Risk Factors:Hypertension.  Sonographer:    Johny Chess RDCS Referring Phys: [id number] [place]  1. Left ventricular ejection fraction, by estimation, is 60 to 65%. The left ventricle has normal function. The left ventricle has no regional wall motion abnormalities. There is mild concentric left ventricular hypertrophy. Left ventricular diastolic parameters are consistent with Grade II diastolic dysfunction (pseudonormalization). Elevated left atrial pressure.  2. Right ventricular systolic function is normal. The right ventricular size is normal. There is normal pulmonary artery systolic pressure.  3. Left atrial size was mildly dilated.  4. The mitral valve is normal in structure. Mild mitral valve regurgitation.  5. The aortic valve is tricuspid. There is mild calcification of the aortic valve. There is mild thickening of the aortic valve. Aortic valve regurgitation is mild. Aortic valve sclerosis is present, with no evidence of aortic valve stenosis.  6. Ascending aorta appears to be s/p graft repair, with normal caliber. Aortic dilatation noted. There is borderline dilatation of the aortic root, measuring 39 mm.  7. The inferior vena cava is normal in size with greater than 50% respiratory variability, suggesting right atrial pressure of 3 mmHg. Comparison(s): No significant change from prior study. Prior images reviewed side  by side. FINDINGS  Left Ventricle: Left ventricular ejection fraction, by estimation, is 60 to 65%. The left ventricle has normal function. The left ventricle has no regional wall motion abnormalities. The le

## 2021-08-31 NOTE — Progress Notes (Signed)
Occupational Therapy Session Note ? ?Patient Details  ?Name: Edward Hopkins ?MRN: [id number] ?Date of Birth: Jul 03, 1950 ? ?Today's Date: 08/31/2021 ?OT Individual Time: 5051-8335 ?OT Individual Time Calculation (min): 58 min  ? ? ?Short Term Goals: ?Week 3:  OT Short Term Goal 1 (Week 3): sitting EOB, pt will don tshirt with min A. ?OT Short Term Goal 2 (Week 3): Pt will transfer to [facility] with min A using LRAD ?OT Short Term Goal 3 (Week 3): Pt will perform toileting tasks with mod A ?OT Short Term Goal 4 (Week 3): Pt will complete bathing tasks with mod A seated EOB or in w/c ? ?Skilled Therapeutic Interventions/Progress Updates:  ?  Pt resting in bed upon arrival. Agreeable to therapy but declined OOB activities. Supine>sit EOB with min A. Sitting balance with CGA. Pt declined bathing tasks this morning and returned to supine for BUE therex. Chest presses with 6# bar bell 3x15, bicep curls 3x20 with 6# bar bell, overhead raises in supine with 5# bar 3x15. Pt remained in bed with all needs wihtin reach and bed alarm activated.  ? ?Therapy Documentation ?Precautions:  ?Precautions ?Precautions: Fall ?Precaution Comments: mild R hemipareisis, 2L O2, slow to initiate ?Restrictions ?Weight Bearing Restrictions: No ?RLE Weight Bearing: Weight bearing as tolerated ?  ?Pain: ?Pain Assessment ?Pain Scale: 0-10 ?Pain Score: 6  ?Pain Location: Ankle ?Pain Intervention(s): premedicated and repositioned ? ? ?Therapy/Group: Individual Therapy ? ?[name] ?08/31/2021, 10:29 AM ?

## 2021-08-31 NOTE — Progress Notes (Signed)
Physical Therapy Session Note ? ?Patient Details  ?Name: Edward Hopkins ?MRN: [id number] ?Date of Birth: 09-Nov-1950 ? ?Today's Date: 08/31/2021 ?PT Individual Time: 0347-4259; 5638-7564 ?PT Individual Time Calculation (min): 75 min and 15 min ?PT Missed Time: 30 minutes ?Missed Time Reason: xray ? ?Short Term Goals: ?Week 3:  PT Short Term Goal 1 (Week 3): =LTG due to ELOS ? ?Skilled Therapeutic Interventions/Progress Updates:  ?  Session 1: ?Pt received seated in bed, agreeable to PT session. Intermittent complaints of R ankle pain during session. Utilized repositioning, rest breaks, and ACE wrap for compression and stability of joint for pain management. Pt declines use of ice despite education regarding benefits of ice for edema and pain management. Supine to sit with min A needed for trunk elevation, HOB elevated and use of bedrail. Sit to stand with mod A needed from elevated bed due to pain and fatigue this AM. Stand pivot transfer bed to w/c with RW and min A. Pt taken via [place] chair to therapy gym. Sit to stand with mod A to RW. Pt able to take a few steps forward with RW and min A in order to look out the windows. Pt does exhibit some offloading of RLE with BUE support on RW. Pt then requests to sit back down due to fatigue. Pt declines any further standing or gait due to fatigue and R ankle pain. Pt requests to return to bed at end of session. Stand pivot transfer w/c to bed with RW and min A. Sit to supine mod A needed for BLE management. Pt on 2L O2 via [place] throughout session, SpO2 remains at 94% and higher with activity. Pt left seated in bed with needs in reach, bed alarm in place. ? ?Session 2: ?Pt received seated in bed. Pt reports feeling very frustrated due to his significant other having his cell phone and not visiting him in the hospital to bring him his phone. Pt reports feeling isolated and disconnected from the outside world. Assisted pt with calling his significant other, she reports she is unable  to get a ride to the hospital to bring him the phone. Provided emotional support to patient. Pt reports urge to urinate. Pt able to continently void urine in urinal with some spillage onto his brief. Assisted pt with changing his brief. Transport arrives to take patient to xray. NT notified that pt needs linens changed when he gets back from xray. Pt missed 30 min of scheduled therapy session due to being taken off floor for xray. ? ?Therapy Documentation ?Precautions:  ?Precautions ?Precautions: Fall ?Precaution Comments: mild R hemipareisis, 2L O2, slow to initiate ?Restrictions ?Weight Bearing Restrictions: No ?RLE Weight Bearing: Weight bearing as tolerated ? ? ? ? ?Therapy/Group: Individual Therapy ? ? ?Excell Seltzer, PT, DPT, CSRS ?08/31/2021, 12:06 PM  ?

## 2021-08-31 NOTE — Progress Notes (Signed)
Patient ID: LATONYA KNIGHT, male   DOB: [birth year], 71 y.o.   MRN: [id number] ? ?SW returned phone call to pt s/o [name] to discuss bed offers extended to pt and accepted at [facility]. She does not want to travel far to visit him. SW shared some of the preferred locations have declined. SW will resend referral to [facility] as requested, and will explore [facility]. SW reiterated that it is up to insurance to decide if they will accept and how they will determine how long he will be at the nursing home. SW will f/u once there is more information.  ? ?SW spoke with Carol/[facility] to discuss referral. Reports will review and will follow-up.  ? ?[name], MSW, LCSWA ?Office: [phone] ?Cell: [phone] ?Fax: [phone]  ?

## 2021-09-01 LAB — URINE CULTURE: Culture: >=100000 — AB

## 2021-09-01 MED ORDER — DARBEPOETIN ALFA 100 MCG/0.5ML IJ SOSY
100.0000 ug | PREFILLED_SYRINGE | INTRAMUSCULAR | Status: DC
  Administered 2021-09-01: 100 ug via SUBCUTANEOUS
  Filled 2021-09-01: qty 0.5

## 2021-09-01 NOTE — Progress Notes (Signed)
Occupational Therapy Discharge Summary ? ?Patient Details  ?Name: Edward Hopkins ?MRN: [id number] ?Date of Birth: 04/06/1951 ? ? ?Patient has met 9 of 11 long term goals due to improved activity tolerance, improved balance, postural control, and ability to compensate for deficits.  Pt made slow and inconsistent progress with BADLs and functional transfers during this admission. Pt requires min A for bathing tasks at shower level. Pt requires max A for LB dressing and toileting tasks. Functional transfers with min A using RW. Pt discharging to SNF for continued rehab. Pt Pt progress inconsistent during this admission. OOB activities have been limited 2/2 pt fatigue, pain, and unwillingness. Pt completed bathing at shower level with mod A but has declined wearing clothiing, preferring hospital gown. Functional transfers have fluctuated between min A/mod A. Patient to discharge at overall Mod Assist level.  Patient's care partner unavailable to provide the necessary physical assistance at discharge and would benefit from further rehab ? ?Reasons goals not met: Pt requires max A for LB dressing and toileting tasks (LTG=mod A) ? ?Recommendation:  ?Patient will benefit from ongoing skilled OT services in skilled nursing facility setting to continue to advance functional skills in the area of BADL and Reduce care partner burden. ? ?Equipment: ?No equipment provided ? ?Reasons for discharge: discharge from hospital ? ?Patient/family agrees with progress made and goals achieved: Yes ? ?OT Discharge ?Precautions/Restrictions  ?  ?General ?OT Amount of Missed Time: 30 Minutes ?Vital Signs ?Therapy Vitals ?Temp: 97.8 ?F (36.6 ?C) ?Pulse Rate: 72 ?Resp: 16 ?BP: 110/63 ?Patient Position (if appropriate): Lying ?Oxygen Therapy ?SpO2: 100 % ?O2 Device: Nasal Cannula ?Pain ?Pain Assessment ?Pain Scale: 0-10 ?Pain Score: 0-No pain ?ADL ?ADL ?Eating: Supervision/safety ?Where Assessed-Eating: Bed level ?Grooming:  Supervision/safety ?Where Assessed-Grooming: Sitting at sink ?Upper Body Bathing: Minimal assistance ?Where Assessed-Upper Body Bathing: Shower ?Lower Body Bathing: Maximal assistance ?Where Assessed-Lower Body Bathing: Shower ?Upper Body Dressing: Minimal assistance ?Where Assessed-Upper Body Dressing: Wheelchair ?Lower Body Dressing: Maximal assistance ?Where Assessed-Lower Body Dressing: Standing at sink ?Toileting: Dependent ?Where Assessed-Toileting: Bedside Commode ?Toilet Transfer: Minimal assistance ?Toilet Transfer Method: Stand pivot ?Toilet Transfer Equipment: Bedside commode ?Walk-In Shower Transfer: Moderate assistance ?Walk-In Shower Transfer Method: Stand pivot ?Walk-In Shower Equipment: Radio broadcast assistant ?Vision ?Baseline Vision/History: 1 Wears glasses ?Patient Visual Report: No change from baseline ?Perception  ?Perception: Within Functional Limits ?Praxis ?Praxis: Intact ?Cognition ?Cognition ?Overall Cognitive Status: Within Functional Limits for tasks assessed ?Arousal/Alertness: Awake/alert ?Orientation Level: Person;Place;Situation ?Person: Oriented ?Place: Oriented ?Situation: Oriented ?Attention: Focused ?Focused Attention: Appears intact ?Awareness: Impaired ?Problem Solving: Impaired ?Safety/Judgment: Appears intact ?Brief Interview for Mental Status (BIMS) ?Repetition of Three Words (First Attempt): 3 ?Temporal Orientation: Year: Correct ?Temporal Orientation: Month: Accurate within 5 days ?Temporal Orientation: Day: Correct ?Recall: "Sock": Yes, no cue required ?Recall: "Blue": Yes, no cue required ?Recall: "Bed": Yes, no cue required ?BIMS Summary Score: 15 ?Sensation ?Sensation ?Light Touch: Appears Intact ?Hot/Cold: Appears Intact ?Proprioception: Impaired by gross assessment ?Stereognosis: Not tested ?Coordination ?Gross Motor Movements are Fluid and Coordinated: No ?Fine Motor Movements are Fluid and Coordinated: No ?Coordination and Movement Description: slow to plan and initiate;  crosses LE at ankles with attempt to move LE's into position to stand ?Finger Nose Finger Test: very slow and labored reaching his arm out and back to nose with dysmetria bilaterally ?Motor  ?Motor ?Motor: Other (comment) ?Motor - Skilled Clinical Observations: slow to plan and initiate; limited hip ROM bilaterally limits seated mobility and balance ?Mobility  ?  Transfers with overall  min to mod A  ?Trunk/Postural Assessment  ?Cervical Assessment ?Cervical Assessment: Exceptions to [facility] (forward head) ?Thoracic Assessment ?Thoracic Assessment: Exceptions to [facility] (rounded shoulders) ?Lumbar Assessment ?Lumbar Assessment: Exceptions to [facility] (posterior pelvic tilt)  ?Balance ?Static Sitting Balance ?Static Sitting - Balance Support: Feet supported ?Static Sitting - Level of Assistance: 5: Stand by assistance ?Dynamic Sitting Balance ?Dynamic Sitting - Balance Support: During functional activity ?Dynamic Sitting - Level of Assistance: 4: Min assist ?Extremity/Trunk Assessment ?RUE Assessment ?RUE Assessment: Within Functional Limits ?General Strength Comments: 4/5 ?LUE Assessment ?LUE Assessment: Within Functional Limits ?General Strength Comments: 4/5 ? ? ?[name] ?09/01/2021, 1:59 PM ?

## 2021-09-01 NOTE — Progress Notes (Signed)
Patient ID: Edward Hopkins, male   DOB: 08/12/1950, 71 y.o.   MRN: [id number] ? ?SW met with pt in room to discuss new updates with regard to SNF locations and conversation with [name] yesterday. He is amenable to SW exploring other SNF options.  ? ?SW left message for partner [name] to inform on possible bed offers,and SW will f/u once more information. ? ?SW spoke with Kim Bailey/liaison over [facility] SNF locations. Reports no long term beds. Will review referral again and determine if able to extend a bed.  ?*SW received message reporting unable to accept due to hx of drug abuse.  ? ?SW left message for Carol/Admissions with [facility] to see if bed offer remains and waiting on follow-up.  ? ? , MSW, LCSWA ?Office: [phone] ?Cell: [phone] ?Fax: [phone]  ?

## 2021-09-01 NOTE — Progress Notes (Signed)
Occupational Therapy Weekly Progress Note ? ?Patient Details  ?Name: Edward Hopkins ?MRN: [id number] ?Date of Birth: 1951/05/06 ? ?Beginning of progress report period: August 26, 2021 ?End of progress report period: September 01, 2021 ? ? ?Patient has met 3 of 4 short term goals.  Pt progress has been inconsistent this past week although overall has demonstrated improvement in BADLs and functional transfers. Pt completed bathing at shower level with mod A sit<>stand to bathe buttocks. Pt continues to declined clothing, preferring to wear hospital gowns. Pt did don tshirt X 1 with min A. Pt often declines OOB activities or sitting EOB 2/2 fatigue/pain. Discharge plans are currently to SNF. ? ?Patient continues to demonstrate the following deficits: muscle weakness, decreased cardiorespiratoy endurance, decreased coordination and decreased motor planning, and decreased sitting balance, decreased standing balance, decreased postural control, and decreased balance strategies and therefore will continue to benefit from skilled OT intervention to enhance overall performance with BADL and Reduce care partner burden. ? ?Patient progressing toward long term goals..  Continue plan of care. ? ?OT Short Term Goals ?Week 3:  OT Short Term Goal 1 (Week 3): sitting EOB, pt will don tshirt with min A. ?OT Short Term Goal 1 - Progress (Week 3): Met ?OT Short Term Goal 2 (Week 3): Pt will transfer to [facility] with min A using LRAD ?OT Short Term Goal 2 - Progress (Week 3): Met ?OT Short Term Goal 3 (Week 3): Pt will perform toileting tasks with mod A ?OT Short Term Goal 3 - Progress (Week 3): Progressing toward goal ?OT Short Term Goal 4 (Week 3): Pt will complete bathing tasks with mod A seated EOB or in w/c ?OT Short Term Goal 4 - Progress (Week 3): Met ?Week 4:  OT Short Term Goal 1 (Week 4): STG=LTG 2/2 ELOS ? ? ?[name] ?09/01/2021, 2:58 PM  ?

## 2021-09-01 NOTE — Progress Notes (Signed)
?                                                       PROGRESS NOTE ? ? ?Subjective/Complaints: ? ?Pt reports had large BM this AM- was incontinent.  ?Slept OK ?Feeling a little better- was started on PO ABX for UTI.  ?Is >100k E Coli.  ? ?  ?ROS:  ? ?Pt denies SOB, abd pain, CP, N/V/C/D, and vision changes ? ? ?Objective: ?  ?DG Abd 1 View ? ?Result Date: 08/31/2021 ?CLINICAL DATA:  Constipation EXAM: ABDOMEN - 1 VIEW COMPARISON:  Abdominal radiograph dated August 13, 2021 FINDINGS: Nonobstructive bowel gas pattern with moderate stool burden. Aortic vascular stents. IVC filter. Advanced degenerative changes of the bilateral hips. IMPRESSION: Nonobstructive bowel gas pattern with moderate stool burden. Electronically Signed   By: Yetta Glassman M.D.   On: 08/31/2021 14:58   ?Recent Labs  ?  08/31/21 ?0532  ?WBC 7.9  ?HGB 9.5*  ?HCT 30.4*  ?PLT 222  ? ? ?Recent Labs  ?  08/30/21 ?7253 08/31/21 ?0532  ?NA 134* 133*  ?K 4.8 4.6  ?CL 96* 100  ?CO2 29 25  ?GLUCOSE 108* 103*  ?BUN 68* 63*  ?CREATININE 2.78* 2.33*  ?CALCIUM 9.0 8.8*  ? ? ? ? ?Intake/Output Summary (Last 24 hours) at 09/01/2021 0901 ?Last data filed at 09/01/2021 0719 ?Gross per 24 hour  ?Intake 236 ml  ?Output 1300 ml  ?Net -1064 ml  ?  ? ?  ? ?Physical Exam: ?Vital Signs ?Blood pressure 117/68, pulse 71, temperature 98.6 ?F (37 ?C), resp. rate 15, height 5\' 8"  (1.727 m), weight 78.9 kg, SpO2 100 %. ? ? ? ? ?General: awake, alert,mumbling a lot; supine in bed- getting cleaned up by NT:  NAD ?HENT: conjugate gaze; oropharynx moist- O2 in place by [place]-  ?CV: regular rate; no JVD ?Pulmonary: CTA B/L; no W/R/R- good air movement ?GI: soft, NT, ND, (+)BS ?Psychiatric: appropriate; flat; mumbling ?Neurological: sleepy- and not as interactive today- but awake ?MS: R ankle mild swelling- TTP medial and lateral malleolus ?Motor: LUE 5/5 ?RUE 4+/5 delt bi, tri grip ?LLE- 5/5 ?RLE- 5-/5- weaker on R side, stable ? ?Assessment/Plan: ?1. Functional deficits which  require 3+ hours per day of interdisciplinary therapy in a comprehensive inpatient rehab setting. ?Physiatrist is providing close team supervision and 24 hour management of active medical problems listed below. ?Physiatrist and rehab team continue to assess barriers to discharge/monitor patient progress toward functional and medical goals ? ?Care Tool: ? ?Bathing ?   ?Body parts bathed by patient: Right arm, Left arm, Chest, Abdomen, Front perineal area, Buttocks, Right upper leg, Left upper leg, Face  ? Body parts bathed by helper: Right lower leg, Left lower leg ?  ?  ?Bathing assist Assist Level: Moderate Assistance - Patient 50 - 74% ?  ?  ?Upper Body Dressing/Undressing ?Upper body dressing   ?What is the patient wearing?: [place] only ?   ?Upper body assist Assist Level: Total Assistance - Patient < 25% ?   ?Lower Body Dressing/Undressing ?Lower body dressing ? ? ?   ?What is the patient wearing?: Incontinence brief ? ?  ? ?Lower body assist Assist for lower body dressing: Total Assistance - Patient < 25% ?   ? ?Toileting ?Toileting    ?Toileting assist  Assist for toileting: 2 Helpers ?  ?  ?Transfers ?Chair/bed transfer ? ?Transfers assist ?   ? ?Chair/bed transfer assist level: Minimal Assistance - Patient > 75% ?Chair/bed transfer assistive device: Walker ?  ?Locomotion ?Ambulation ? ? ?Ambulation assist ? ? Ambulation activity did not occur: Safety/medical concerns ? ?Assist level: Minimal Assistance - Patient > 75% ?Assistive device: Walker-rolling ?Max distance: 5'  ? ?Walk 10 feet activity ? ? ?Assist ? Walk 10 feet activity did not occur: Safety/medical concerns ? ?Assist level: Contact Guard/Touching assist ?Assistive device: Walker-rolling  ? ?Walk 50 feet activity ? ? ?Assist Walk 50 feet with 2 turns activity did not occur: Safety/medical concerns ? ?  ?   ? ? ?Walk 150 feet activity ? ? ?Assist Walk 150 feet activity did not occur: Safety/medical concerns ? ?  ?  ?  ? ?Walk 10 feet on uneven  surface  ?activity ? ? ?Assist Walk 10 feet on uneven surfaces activity did not occur: Safety/medical concerns ? ? ?  ?   ? ?Wheelchair ? ? ? ? ?Assist Is the patient using a wheelchair?: Yes ?Type of Wheelchair: Manual ?  ? ?Wheelchair assist level: Dependent - Patient 0% ?   ? ? ?Wheelchair 50 feet with 2 turns activity ? ? ? ?Assist ? ?  ?Wheelchair 50 feet with 2 turns activity did not occur: Safety/medical concerns ? ? ?   ? ?Wheelchair 150 feet activity  ? ? ? ?Assist ? Wheelchair 150 feet activity did not occur: Safety/medical concerns ? ? ?   ? ?Blood pressure 117/68, pulse 71, temperature 98.6 ?F (37 ?C), resp. rate 15, height 5\' 8"  (1.727 m), weight 78.9 kg, SpO2 100 %. ? ?Medical Problem List and Plan: ?1. Functional deficits secondary to debility from  thoracic dissection of aneurysm  ?           vascular says pt healing well- CT of chest, abdomen and pelvis  at 4 weeks post op ? 4/20- goal SNF- Continue CIR- PT, OT and SLP ? ?2.  Antithrombotics: ?-DVT/anticoagulation:  Pharmaceutical: Lovenox.  ?            -antiplatelet therapy: N/A ?3. Back pain: add kpad. continue oxycodone prn  ? 4/4- having a lot of lower chest and back pain- con't pain meds- will add lidocaine patches for night time for back pain.  ? 4/17- would like [place] to reduce pain meds/oxycodone- will change to 2.5 mg to 5 mg q4 hours prn esp for R ankle pain ? 4/18- holding meds, per pt- due to sedation. ?4/19- changed oxycodone to tramadol 50 mg BID prn  ?4. Mood: LCSW to follow for evaluation and support.  ?            -antipsychotic agents: N/A ?5. Neuropsych: This patient may be  capable of making decisions on his own behalf. ?6. Skin/Wound Care: Routine pressure relief measures.  Monitor areas on right forearm/wrist for demarcation ?            -will add Vitamin C and Zinc to promote healing.  ?7. Fluids/Electrolytes/Nutrition: Monitor I/Os ?8. Ruptured  descending thoracic aneurysm s/p repair: BP management.  ?            --will need  repeat CTA 4 weeks (monitoring SCr  for improvement) ? 4.17- will need CT of Chest/abd/pelvis at 4 weeks ?9. Acute on chronic renal failure: with new CKD Stage IV Improvement in BUN/SCr- 39/2.38 ?            --  monitor with serial checks. Avoid nephrotoxic medications.  ? 3/31- Cr and BUN stable con't to monitor weekly.  ? 4/3- Cr still 2.3-2.4- is stable- per Vascular wants pt kidney function to improve some, before CTA< however need to check for PE/so will do both ? 4/6- labs in AM ? 4/7- Cr 2.46- very slightly increased- will con't regimen ? Creatinine 2.49 on 4/11, stable ? 4/17- Cr up to 2.94- and BUN up to 60 from 40- will give IVFs NS 75cc/hour x 24 hours- pt's intake poor. Will recheck BMP in AM  ? 4/18- Cr down to 2.78- but BUN up slightly- to 68- will continue IVFs today for another 24 hours and recheck in AM ? 4/19- Cr down to 2.33 and BUN 63- slightly improved- stop IVFs and recheck  ? 4/20- will recheck BMP in AM ?10. Acute blood loss anemia: Hgb reviewed and improving, monitor weekly.  ? Hemoglobin 9.3 on 4/11 ? 4/18- Hb up to 10 yesterday- will recheck in AM due to elevated BUN - making sure doesn't have GI bleed? Unlikely, but want to make sure not dropping ? 4/19- Hb 9.5- stable and even with hydration ?11. Hypoxia: continue oxygen via nasal cannula as needed  ?            --Off diuresis. Resp rate increase with minimal movement- will monitor O2 sats.  ? 3/31- monitor closely, since  SOB with minimal exertion. ?4/3- still requiring O2- will check for PE this evening- since pt's still has DOE.  ?4/4- Couldn't do CTA- so ordered VQ scabn- pending.   ?4/5- has PE- but no DVT- has filter from 1995- d/w pulmonary- risk of AC is worse than no AC- will hold off- due to high risk of bleeding profusely per vascular-  ?Asking palliative care to help with goals.  ? 4/6- pt wants to be full code right now palliative care ot see again today- family coming today.  ?4/7- O2 sats lower this AM- on RA- needed to be  put back on O2 ?4/17- pt's sats good on 2L O2 ?12. ABLA: Improving with Epogen and thrombocytopenia has resolved.  Continue to monitor as needed.  ?13. Polysubstance abuse: Educated on importance of cessatio

## 2021-09-01 NOTE — Progress Notes (Signed)
Occupational Therapy Session Note ? ?Patient Details  ?Name: Edward Hopkins ?MRN: [id number] ?Date of Birth: [birth year] ? ?Today's Date: 09/01/2021 ?OT Individual Time: 0815-0900 ?OT Individual Time Calculation (min): 45 min  ? ? ?Short Term Goals: ?Week 3:  OT Short Term Goal 1 (Week 3): sitting EOB, pt will don tshirt with min A. ?OT Short Term Goal 2 (Week 3): Pt will transfer to [facility] with min A using LRAD ?OT Short Term Goal 3 (Week 3): Pt will perform toileting tasks with mod A ?OT Short Term Goal 4 (Week 3): Pt will complete bathing tasks with mod A seated EOB or in w/c ? ?Skilled Therapeutic Interventions/Progress Updates:  ?  Pt in bed upon arrival with NT. Pt incontinent pf bowel. Min A for rolling R/L in bed for hygiene and donning brief at bed level. Pt declined donning clothing and requested hosptial gown. OT intervention with focus on bed mobility. Rolling and repositioning with min A. Pt declined sitting EOB, stating that he was too cold and just wanted to cover up. Pt missed 30 mins skilled OT services. Will check back later as schedule allows.  ? ?Therapy Documentation ?Precautions:  ?Precautions ?Precautions: Fall ?Precaution Comments: mild R hemipareisis, 2L O2, slow to initiate ?Restrictions ?Weight Bearing Restrictions: No ?RLE Weight Bearing: Weight bearing as tolerated ?General: ?General ?OT Amount of Missed Time: 30 Minutes ?  ?Pain: ? Pt reports Rt ankle tenderness but improved ? ? ?Therapy/Group: Individual Therapy ? ?[name] ?09/01/2021, 10:43 AM ?

## 2021-09-01 NOTE — Progress Notes (Signed)
Physical Therapy Session Note ? ?Patient Details  ?Name: Edward Hopkins ?MRN: [id number] ?Date of Birth: [birth year] ? ?Today's Date: 09/01/2021 ?PT Individual Time: 8768-1157 ?PT Individual Time Calculation (min): 68 min  ? ?Short Term Goals: ?Week 3:  PT Short Term Goal 1 (Week 3): =LTG due to ELOS ? ?Skilled Therapeutic Interventions/Progress Updates: Pt presented in bed with SO and cousin present agreeable to therapy. Pt requesting to go outside for short time. Pt performed supine to sit at EOB with supervision and increased time. Pt continues to demonstrate moderate posterior lean upon sitting EOB with BLE extended. Pt requesting to don shirt and pants to go outside. Pt able to don gown with set up and PTA donned pants total A for energy conservation. Performed ambulatory transfer to TIS with modA for Sit to stand due to posterior lean and minA for ambulation. Pt then indicating need for urinary void. Pt wishing to attempt in standing. Performed Sit to stand with CGA but posterior lean noted pushing against TIS. Once balance stabilized pt able to mange LB clothing and attempting to manage urinal. Due to poor placement pt urinated on leg/floor vs urinal with PTA assisting managing urinal to complete void and minimize spillage. Once pt in sitting, PTA doffed LB clothing total A for time management and energy conservation and washed BLE to remove urine. PTA then donned socks, brief, and pants total A. Pt performed Sit to stand in same manner as prior and pt was able to pull pants over hips with minA. Pt then transported outside ~10 min with pt discussing with PTA anticipation of d/c to SNF. Pt transported back to room and performed ambulatory transfer back to bed with pt ambulating ~55f with CGA. Pt required minA for sit to supine with use of bed features. Pt left in bed at end of session with bed alarm on, call bell within reach and needs met.  ?   ? ?Therapy Documentation ?Precautions:  ?Precautions ?Precautions:  Fall ?Precaution Comments: mild R hemipareisis, 2L O2, slow to initiate ?Restrictions ?Weight Bearing Restrictions: No ?RLE Weight Bearing: Weight bearing as tolerated ?General: ?  ?Vital Signs: ?Therapy Vitals ?Temp: 97.8 ?F (36.6 ?C) ?Pulse Rate: 72 ?Resp: 16 ?BP: 110/63 ?Patient Position (if appropriate): Lying ?Oxygen Therapy ?SpO2: 100 % ?O2 Device: Nasal Cannula ?Pain: ?  ?Mobility: ?  ?Locomotion : ?   ?Trunk/Postural Assessment : ?Cervical Assessment ?[facility]: [facility] head) ?Thoracic Assessment ?[facility] Assessment: [facility] to [facility]) ?Lumbar Assessment ?Lumbar Assessment: [facility](posterior pelvic tilt)  ?Balance: ?Static Sitting Balance ?Static Sitting - Balance Support: Feet supported ?Static Sitting - Level of Assistance: 5: Stand by assistance ?Dynamic Sitting Balance ?Dynamic Sitting - Balance Support: During functional activity ?Dynamic Sitting - Level of Assistance: 4: Min assist ?Exercises: ?  ?Other Treatments:   ? ? ? ?Therapy/Group: Individual Therapy ? ?Leather Estis ?09/01/2021, 4:09 PM  ?

## 2021-09-02 ENCOUNTER — Other Ambulatory Visit: Payer: Self-pay

## 2021-09-02 DIAGNOSIS — I7113 Aneurysm of the descending thoracic aorta, ruptured: Secondary | ICD-10-CM

## 2021-09-02 LAB — BASIC METABOLIC PANEL
Anion gap: 9 (ref 5–15)
BUN: 54 mg/dL — ABNORMAL HIGH (ref 8–23)
CO2: 24 mmol/L (ref 22–32)
Calcium: 9.5 mg/dL (ref 8.9–10.3)
Chloride: 100 mmol/L (ref 98–111)
Creatinine, Ser: 2.23 mg/dL — ABNORMAL HIGH (ref 0.61–1.24)
GFR, Estimated: 30 mL/min — ABNORMAL LOW (ref >60)
Glucose, Bld: 99 mg/dL (ref 70–99)
Potassium: 4.4 mmol/L (ref 3.5–5.1)
Sodium: 133 mmol/L — ABNORMAL LOW (ref 135–145)

## 2021-09-02 NOTE — Progress Notes (Signed)
Physical Therapy Session Note ? ?Patient Details  ?Name: Edward Hopkins ?MRN: [id number] ?Date of Birth: 01/01/1951 ? ?Today's Date: 09/02/2021 ?PT Individual Time: 1030-1100 ?PT Individual Time Calculation (min): 30 min  ? ?Short Term Goals: ?Week 1:  PT Short Term Goal 1 (Week 1): Pt will complete least restrictive transfer with min A consistently ?PT Short Term Goal 1 - Progress (Week 1): Not met ?PT Short Term Goal 2 (Week 1): Pt will ambulate x 25 ft with LRAD and min A ?PT Short Term Goal 2 - Progress (Week 1): Not met ?PT Short Term Goal 3 (Week 1): Pt will initiate stair training as safe and able ?PT Short Term Goal 3 - Progress (Week 1): Not met ?Week 2:  PT Short Term Goal 1 (Week 2): =LTG due to ELOS ?PT Short Term Goal 1 - Progress (Week 2): Progressing toward goal ?Week 3:  PT Short Term Goal 1 (Week 3): =LTG due to ELOS ? ?Skilled Therapeutic Interventions/Progress Updates:  ?  pt received in bed and agreeable to therapy. Pt without O2 throughout session, maintained >97 when checked. Pt reports back pain and ankle pain, unrated and premedicated. Rest and positioning provided as needed. Supine<>sit with supervision and cueing for technique. Pt required increased time for problem solving and processing instructions. Pt with mild dizziness on sitting/standing that improved with time. Sit to stand x 2 from elevated bed, limited by ankle pain/ROM, min A to RW with cueing for hand placement. Pt ambulated 2 x ~10 ft to./from w/c with min A. Pt declined to remain in w/c, even with encouragement, and opted to return to bed. Pt returned to bed in the manner written above and was left with all needs in reach and alarm active.  ? ?Therapy Documentation ?Precautions:  ?Precautions ?Precautions: Fall ?Precaution Comments: mild R hemipareisis, 2L O2, slow to initiate ?Restrictions ?Weight Bearing Restrictions: No ?RLE Weight Bearing: Weight bearing as tolerated ?General: ?  ? ? ? ?Therapy/Group: Individual  Therapy ? ? C  ?09/02/2021, 11:35 AM  ?

## 2021-09-02 NOTE — Progress Notes (Signed)
Patient ID: Edward Hopkins, male   DOB: 22-Dec-1950, 71 y.o.   MRN: [id number] ? ?SW left message for Carol/Admissions with [facility] to see if bed offer remains and waiting on follow-up.  ?*SW spoke with Arbie Cookey who confirms they only have a short term rehab bed and no long term care beds. Able to accept pt on Monday pending auth.  ? ?SW spoke with pt s/o [name] to inform on above. SW reiterated that if pt does not meet a level that is manageable, he is unable to remain at this SNF. She was in agreement with SW pursuing. ? ?SW submitted SNF auth to [facility] ([phone]/f:[phone]/ ref [phone]). SW faxed requested clinicals.  ? ?SW met with pt to inform on above. ? ?[name], MSW, LCSWA ?Office: [phone] ?Cell: [phone] ?Fax: [phone]  ?

## 2021-09-02 NOTE — Progress Notes (Signed)
Physical Therapy Session Note ? ?Patient Details  ?Name: Edward Hopkins ?MRN: [id number] ?Date of Birth: 09-14-1950 ? ?Today's Date: 09/02/2021 ?PT Individual Time: 1303-1400 ?PT Individual Time Calculation (min): 57 min  ? ?Short Term Goals: ?Week 3:  PT Short Term Goal 1 (Week 3): =LTG due to ELOS ? ?Skilled Therapeutic Interventions/Progress Updates: Pt presented in bed sleeping but easily aroused and agreeable to therapy. Pt denies pain at rest but some soreness at ankle during standing and ambulation. Rest breaks provided as needed. Session focused on functional mobility and endurance. Pt noted to be on RA upon entry with pt in no acute distress. SpO2 checked 93%. NT arrived for vitals check. Performed bed mobility with supervision and increased time. Pt requesting to urinate upon sitting. Performed Sit to stand with minA and pt required minA for clothing management for continent urinary void. Pt then performed ambulatory transfer to TIS ~42f with CGA. SpO2 checked after transfer at 86% therefore supplemental O2 provided during activity at 1L. Pt transported to day room and pt requesting to ambulate. Pt ambulated 32fx 2 with RW and CGA for both transfers and ambulation. Pt demonstrated fairly erect posture, shortened but equal step length and fair safety with RW. On first bout SpO2 >90% however during second bout 87% upon sitting with pt stating mild SOB. Pt transported back to room and performed ambulatory transfer to bed CGA with RW. Pt then performed sit to sidelying with supervision and use of bed features. SpO2 checked once repositioned in bed at 96% but continued to c/o mild SOB. Pt left in bed at end of session with bed alarm on, call bell within reach and needs met. Nsg notified of pt's O2 status with pt left with 1L supplemental O2.  ?   ? ?Therapy Documentation ?Precautions:  ?Precautions ?Precautions: Fall ?Precaution Comments: mild R hemipareisis, 2L O2, slow to initiate ?Restrictions ?Weight Bearing  Restrictions: No ?RLE Weight Bearing: Weight bearing as tolerated ?General: ?  ?Vital Signs: ?Therapy Vitals ?Temp: 98.2 ?F (36.8 ?C) ?Temp Source: Oral ?Pulse Rate: 91 ?Resp: 18 ?BP: 121/83 ?Patient Position (if appropriate): Sitting ?Oxygen Therapy ?SpO2: 99 % ?O2 Device: Room Air ?Pain: ?  ?Mobility: ?  ?Locomotion : ?   ?Trunk/Postural Assessment : ?   ?Balance: ?  ?Exercises: ?  ?Other Treatments:   ? ? ? ?Therapy/Group: Individual Therapy ? ?[name] ?09/02/2021, 3:43 PM  ?

## 2021-09-02 NOTE — Progress Notes (Signed)
?                                                       PROGRESS NOTE ? ? ?Subjective/Complaints: ? ?Pt rpeorts starting to hurt again- asking if has pain meds- explained has to ask for Tramadol up to 2x/day.  ?Mainly R ankle.  ? ?Ate ~25% of breakfast, which is better.  ? ? ?ROS:  ? ?Pt denies SOB, abd pain, CP, N/V/C/D, and vision changes ? ? ?Objective: ?  ?No results found. ?Recent Labs  ?  08/31/21 ?0532  ?WBC 7.9  ?HGB 9.5*  ?HCT 30.4*  ?PLT 222  ? ? ?Recent Labs  ?  08/31/21 ?0532 09/02/21 ?2202  ?NA 133* 133*  ?K 4.6 4.4  ?CL 100 100  ?CO2 25 24  ?GLUCOSE 103* 99  ?BUN 63* 54*  ?CREATININE 2.33* 2.23*  ?CALCIUM 8.8* 9.5  ? ? ? ? ?Intake/Output Summary (Last 24 hours) at 09/02/2021 1506 ?Last data filed at 09/02/2021 0700 ?Gross per 24 hour  ?Intake 280 ml  ?Output 900 ml  ?Net -620 ml  ?  ? ?  ? ?Physical Exam: ?Vital Signs ?Blood pressure 121/83, pulse 91, temperature 98.2 ?F (36.8 ?C), temperature source Oral, resp. rate 18, height 5\' 8"  (1.727 m), weight 74.7 kg, SpO2 99 %. ? ? ? ? ? ?General: awake, alert, appropriate, laying supine in bed; woke to stimuli; NAD ?HENT: conjugate gaze; oropharynx moist- O2 by [place] ?CV: regular rate; no JVD ?Pulmonary: CTA B/L; no W/R/R- good air movement ?GI: soft, NT, ND, (+)BS ?Psychiatric: appropriate ?Neurological: sleepy- but more interactive ?MS: R ankle mild swelling- TTP medial and lateral malleolus ?Motor: LUE 5/5 ?RUE 4+/5 delt bi, tri grip ?LLE- 5/5 ?RLE- 5-/5- weaker on R side, stable ? ?Assessment/Plan: ?1. Functional deficits which require 3+ hours per day of interdisciplinary therapy in a comprehensive inpatient rehab setting. ?Physiatrist is providing close team supervision and 24 hour management of active medical problems listed below. ?Physiatrist and rehab team continue to assess barriers to discharge/monitor patient progress toward functional and medical goals ? ?Care Tool: ? ?Bathing ?   ?Body parts bathed by patient: Right arm, Left arm, Chest, Abdomen,  Front perineal area, Buttocks, Right upper leg, Left upper leg, Face  ? Body parts bathed by helper: Right lower leg, Left lower leg ?  ?  ?Bathing assist Assist Level: Moderate Assistance - Patient 50 - 74% ?  ?  ?Upper Body Dressing/Undressing ?Upper body dressing   ?What is the patient wearing?: [place] only ?   ?Upper body assist Assist Level: Total Assistance - Patient < 25% ?   ?Lower Body Dressing/Undressing ?Lower body dressing ? ? ?   ?What is the patient wearing?: Incontinence brief ? ?  ? ?Lower body assist Assist for lower body dressing: Total Assistance - Patient < 25% ?   ? ?Toileting ?Toileting    ?Toileting assist Assist for toileting: Total Assistance - Patient < 25% ?  ?  ?Transfers ?Chair/bed transfer ? ?Transfers assist ?   ? ?Chair/bed transfer assist level: Minimal Assistance - Patient > 75% ?Chair/bed transfer assistive device: Walker ?  ?Locomotion ?Ambulation ? ? ?Ambulation assist ? ? Ambulation activity did not occur: Safety/medical concerns ? ?Assist level: Minimal Assistance - Patient > 75% ?Assistive device: Walker-rolling ?Max distance: 5'  ? ?  Walk 10 feet activity ? ? ?Assist ? Walk 10 feet activity did not occur: Safety/medical concerns ? ?Assist level: Contact Guard/Touching assist ?Assistive device: Walker-rolling  ? ?Walk 50 feet activity ? ? ?Assist Walk 50 feet with 2 turns activity did not occur: Safety/medical concerns ? ?  ?   ? ? ?Walk 150 feet activity ? ? ?Assist Walk 150 feet activity did not occur: Safety/medical concerns ? ?  ?  ?  ? ?Walk 10 feet on uneven surface  ?activity ? ? ?Assist Walk 10 feet on uneven surfaces activity did not occur: Safety/medical concerns ? ? ?  ?   ? ?Wheelchair ? ? ? ? ?Assist Is the patient using a wheelchair?: Yes ?Type of Wheelchair: Manual ?  ? ?Wheelchair assist level: Dependent - Patient 0% ?   ? ? ?Wheelchair 50 feet with 2 turns activity ? ? ? ?Assist ? ?  ?Wheelchair 50 feet with 2 turns activity did not occur: Safety/medical  concerns ? ? ?   ? ?Wheelchair 150 feet activity  ? ? ? ?Assist ? Wheelchair 150 feet activity did not occur: Safety/medical concerns ? ? ?   ? ?Blood pressure 121/83, pulse 91, temperature 98.2 ?F (36.8 ?C), temperature source Oral, resp. rate 18, height 5\' 8"  (1.727 m), weight 74.7 kg, SpO2 99 %. ? ?Medical Problem List and Plan: ?1. Functional deficits secondary to debility from  thoracic dissection of aneurysm  ?           vascular says pt healing well- CT of chest, abdomen and pelvis  at 4 weeks post op ? 4/21- goal SNF- Continue CIR- PT, OT and SLP ?2.  Antithrombotics: ?-DVT/anticoagulation:  Pharmaceutical: Lovenox.  ?            -antiplatelet therapy: N/A ?3. Back pain: add kpad. continue oxycodone prn  ? 4/4- having a lot of lower chest and back pain- con't pain meds- will add lidocaine patches for night time for back pain.  ? 4/17- would like [place] to reduce pain meds/oxycodone- will change to 2.5 mg to 5 mg q4 hours prn esp for R ankle pain ? 4/18- holding meds, per pt- due to sedation. ?4/19- changed oxycodone to tramadol 50 mg BID prn ?4/21- pt asking for pain meds- encouraged ot ask nursing- called nursing station for pt  ?4. Mood: LCSW to follow for evaluation and support.  ?            -antipsychotic agents: N/A ?5. Neuropsych: This patient may be  capable of making decisions on his own behalf. ?6. Skin/Wound Care: Routine pressure relief measures.  Monitor areas on right forearm/wrist for demarcation ?            -will add Vitamin C and Zinc to promote healing.  ?7. Fluids/Electrolytes/Nutrition: Monitor I/Os ?8. Ruptured  descending thoracic aneurysm s/p repair: BP management.  ?            --will need repeat CTA 4 weeks (monitoring SCr  for improvement) ? 4.17- will need CT of Chest/abd/pelvis at 4 weeks ?9. Acute on chronic renal failure: with new CKD Stage IV Improvement in BUN/SCr- 39/2.38 ?            --monitor with serial checks. Avoid nephrotoxic medications.  ? 3/31- Cr and BUN stable con't  to monitor weekly.  ? 4/3- Cr still 2.3-2.4- is stable- per Vascular wants pt kidney function to improve some, before CTA< however need to check for PE/so will do both ?  4/6- labs in AM ? 4/7- Cr 2.46- very slightly increased- will con't regimen ? Creatinine 2.49 on 4/11, stable ? 4/17- Cr up to 2.94- and BUN up to 60 from 40- will give IVFs NS 75cc/hour x 24 hours- pt's intake poor. Will recheck BMP in AM  ? 4/18- Cr down to 2.78- but BUN up slightly- to 68- will continue IVFs today for another 24 hours and recheck in AM ? 4/19- Cr down to 2.33 and BUN 63- slightly improved- stop IVFs and recheck  ? 4/21- Cr down to 2.23- and BUN down to 54- will recheck Monday and give IVF if needed- con't to push fluids ?10. Acute blood loss anemia: Hgb reviewed and improving, monitor weekly.  ? Hemoglobin 9.3 on 4/11 ? 4/18- Hb up to 10 yesterday- will recheck in AM due to elevated BUN - making sure doesn't have GI bleed? Unlikely, but want to make sure not dropping ? 4/19- Hb 9.5- stable and even with hydration ?11. Hypoxia: continue oxygen via nasal cannula as needed  ?            --Off diuresis. Resp rate increase with minimal movement- will monitor O2 sats.  ? 3/31- monitor closely, since  SOB with minimal exertion. ?4/3- still requiring O2- will check for PE this evening- since pt's still has DOE.  ?4/4- Couldn't do CTA- so ordered VQ scabn- pending.   ?4/5- has PE- but no DVT- has filter from 1995- d/w pulmonary- risk of AC is worse than no AC- will hold off- due to high risk of bleeding profusely per vascular-  ?Asking palliative care to help with goals.  ? 4/6- pt wants to be full code right now palliative care ot see again today- family coming today.  ?4/7- O2 sats lower this AM- on RA- needed to be put back on O2 ?4/17- pt's sats good on 2L O2 ?12. ABLA: Improving with Epogen and thrombocytopenia has resolved.  Continue to monitor as needed.  ?13. Polysubstance abuse: Educated on importance of cessation ?             --smokes > 1 PPD and marijuana/cocaine-->Has planned to quit.  ?14. Obesity: BMI 30.- initially documented as BMI of 68- was wrong- Educate on diet and importance of weight loss to help promote mobility

## 2021-09-02 NOTE — Progress Notes (Signed)
Occupational Therapy Session Note ? ?Patient Details  ?Name: Edward Hopkins ?MRN: [id number] ?Date of Birth: 1950-06-14 ? ?Today's Date: 09/02/2021 ?OT Individual Time: 0156-1537 ?OT Individual Time Calculation (min): 77 min  ? ? ?Short Term Goals: ?Week 3:  OT Short Term Goal 1 (Week 3): sitting EOB, pt will don tshirt with min A. ?OT Short Term Goal 1 - Progress (Week 3): Met ?OT Short Term Goal 2 (Week 3): Pt will transfer to [facility] with min A using LRAD ?OT Short Term Goal 2 - Progress (Week 3): Met ?OT Short Term Goal 3 (Week 3): Pt will perform toileting tasks with mod A ?OT Short Term Goal 3 - Progress (Week 3): Progressing toward goal ?OT Short Term Goal 4 (Week 3): Pt will complete bathing tasks with mod A seated EOB or in w/c ?OT Short Term Goal 4 - Progress (Week 3): Met ? ?Skilled Therapeutic Interventions/Progress Updates:  ?  Pt received supine with no c/o pain, agreeable to OT session. He was initially agreeable to EOB ADLs but then once EOB he requested to take a shower. He demonstrated a posterior bias with BLE extension stiting EOB- having difficulty following cueing to break up this posture. He completed a sit > stand from EOB with min A, then pivoting to the reclining shower chair with min A using the RW. Pt on 2L O2 throughout session, all VSS- eventually trialed removing the O2 and he remained >95% spo2 on RA. He bathed in the shower with min A for LB bathing, feet and buttocks. He was wheeled out to the sink where he completed grooming tasks with set up assist. Significant oral bleeding from several teeth that appeared to be loose. He reports this is a chronic issue. He completed UB dressing with (S). He ambulated from the shower chair to the TIS w/c with min A using the RW. He donned pants with mod A. He was able to stand from the TIS and pull up pants with CGA. Slow processing and rest breaks required throughout session. Pt was left supine with all needs met, chair alarm set, and call bell within  reach.  ? ?Bulge observed in pt's L inguinal region in shower. He reports he has had a hernia in the past in this region. LPN notified of this, pt now being on RA, and of oral bleeding.  ? ? ? ?Therapy Documentation ?Precautions:  ?Precautions ?Precautions: Fall ?Precaution Comments: mild R hemipareisis, 2L O2, slow to initiate ?Restrictions ?Weight Bearing Restrictions: No ?RLE Weight Bearing: Weight bearing as tolerated ? ?Therapy/Group: Individual Therapy ? ?Curtis Sites ?09/02/2021, 6:20 AM ?

## 2021-09-03 NOTE — Progress Notes (Signed)
Physical Therapy Session Note ? ?Patient Details  ?Name: Edward Hopkins ?MRN: [id number] ?Date of Birth: October 18, 1950 ? ?Today's Date: 09/03/2021 ?PT Individual Time: 8127-5170 ?PT Individual Time Calculation (min): 58 min  ? ?Short Term Goals: ?Week 1:  PT Short Term Goal 1 (Week 1): Pt will complete least restrictive transfer with min A consistently ?PT Short Term Goal 1 - Progress (Week 1): Not met ?PT Short Term Goal 2 (Week 1): Pt will ambulate x 25 ft with LRAD and min A ?PT Short Term Goal 2 - Progress (Week 1): Not met ?PT Short Term Goal 3 (Week 1): Pt will initiate stair training as safe and able ?PT Short Term Goal 3 - Progress (Week 1): Not met ? ?Skilled Therapeutic Interventions/Progress Updates:  ?Pt was seen bedside in the am. Pt transferred supine to edge of bed and edge of bed to supine with side rail and S with increased time. Pt transferred from edge of bed to standing with rolling walker and c/g to min A with verbal cues. Pt ambulated to w/c about 10 feet with c/g and verbal cues. Pt transported to rehab gym. Pt ambulated 50 feet and 71 feet x 2 with rolling walker and c/g with verbal cues. Pt performed all transfers from the w/c and chair in gym with c/g and verbal cues. Pt ambulated with decreased step length B and decreased cadence. Pt returned to room following treatment and left sitting up in bed with all needs within reach.  ? ?Therapy Documentation ?Precautions:  ?Precautions ?Precautions: Fall ?Precaution Comments: mild R hemipareisis, 2L O2, slow to initiate ?Restrictions ?Weight Bearing Restrictions: No ?RLE Weight Bearing: Weight bearing as tolerated ?General: ?  ?Pain: ?Generalized c/o discomfort R ankle.  ? ?Therapy/Group: Individual Therapy ? ?K[place], [place] ?09/03/2021, 12:01 PM  ?

## 2021-09-03 NOTE — Progress Notes (Signed)
?                                                       PROGRESS NOTE ? ? ?Subjective/Complaints: ? ?Pt reports feeling better today.  ?Per PT< walked 70 ft RW CGA today- doing MUCH better.  ? ?Asking for strawberry ice cream or Ensure.  ? ?Also discussed with pt drinking more water.  ?ROS:  ? ? ?Pt denies SOB, abd pain, CP, N/V/C/D, and vision changes ? ? ?Objective: ?  ?No results found. ?No results for input(s): WBC, HGB, HCT, PLT in the last 72 hours. ? ? ?Recent Labs  ?  09/02/21 ?8841  ?NA 133*  ?K 4.4  ?CL 100  ?CO2 24  ?GLUCOSE 99  ?BUN 54*  ?CREATININE 2.23*  ?CALCIUM 9.5  ? ? ? ? ?Intake/Output Summary (Last 24 hours) at 09/03/2021 1613 ?Last data filed at 09/03/2021 1457 ?Gross per 24 hour  ?Intake 160 ml  ?Output 600 ml  ?Net -440 ml  ?  ? ?  ? ?Physical Exam: ?Vital Signs ?Blood pressure 122/74, pulse 73, temperature 98.3 ?F (36.8 ?C), resp. rate 17, height 5\' 8"  (1.727 m), weight 75.7 kg, SpO2 98 %. ? ? ? ? ? ? ?General: awake, alert, appropriate, sitting EOB and then seen In hallway with PT; NAD ?HENT: conjugate gaze; oropharynx moist- On O2 by [place];  ?CV: regular rate; no JVD ?Pulmonary: CTA B/L; no W/R/R- good air movement ?GI: soft, NT, ND, (+)BS ?Psychiatric: appropriate- brighter ?Neurological: alert- MUCH more awake, interactive ?MS: R ankle mild swelling- TTP medial and lateral malleolus ?Motor: LUE 5/5 ?RUE 4+/5 delt bi, tri grip ?LLE- 5/5 ?RLE- 5-/5- weaker on R side, stable ? ?Assessment/Plan: ?1. Functional deficits which require 3+ hours per day of interdisciplinary therapy in a comprehensive inpatient rehab setting. ?Physiatrist is providing close team supervision and 24 hour management of active medical problems listed below. ?Physiatrist and rehab team continue to assess barriers to discharge/monitor patient progress toward functional and medical goals ? ?Care Tool: ? ?Bathing ?   ?Body parts bathed by patient: Right arm, Left arm, Chest, Abdomen, Front perineal area, Buttocks, Right upper  leg, Left upper leg, Face  ? Body parts bathed by helper: Right lower leg, Left lower leg ?  ?  ?Bathing assist Assist Level: Moderate Assistance - Patient 50 - 74% ?  ?  ?Upper Body Dressing/Undressing ?Upper body dressing   ?What is the patient wearing?: [place] only ?   ?Upper body assist Assist Level: Total Assistance - Patient < 25% ?   ?Lower Body Dressing/Undressing ?Lower body dressing ? ? ?   ?What is the patient wearing?: Incontinence brief ? ?  ? ?Lower body assist Assist for lower body dressing: Total Assistance - Patient < 25% ?   ? ?Toileting ?Toileting    ?Toileting assist Assist for toileting: Total Assistance - Patient < 25% ?  ?  ?Transfers ?Chair/bed transfer ? ?Transfers assist ?   ? ?Chair/bed transfer assist level: Minimal Assistance - Patient > 75% ?Chair/bed transfer assistive device: Walker ?  ?Locomotion ?Ambulation ? ? ?Ambulation assist ? ? Ambulation activity did not occur: Safety/medical concerns ? ?Assist level: Contact Guard/Touching assist ?Assistive device: Walker-rolling ?Max distance: 86  ? ?Walk 10 feet activity ? ? ?Assist ? Walk 10 feet activity did not occur:  Safety/medical concerns ? ?Assist level: Contact Guard/Touching assist ?Assistive device: Walker-rolling  ? ?Walk 50 feet activity ? ? ?Assist Walk 50 feet with 2 turns activity did not occur: Safety/medical concerns ? ?Assist level: Contact Guard/Touching assist ?Assistive device: Walker-rolling  ? ? ?Walk 150 feet activity ? ? ?Assist Walk 150 feet activity did not occur: Safety/medical concerns ? ?  ?  ?  ? ?Walk 10 feet on uneven surface  ?activity ? ? ?Assist Walk 10 feet on uneven surfaces activity did not occur: Safety/medical concerns ? ? ?  ?   ? ?Wheelchair ? ? ? ? ?Assist Is the patient using a wheelchair?: Yes ?Type of Wheelchair: Manual ?  ? ?Wheelchair assist level: Dependent - Patient 0% ?   ? ? ?Wheelchair 50 feet with 2 turns activity ? ? ? ?Assist ? ?  ?Wheelchair 50 feet with 2 turns activity did  not occur: Safety/medical concerns ? ? ?   ? ?Wheelchair 150 feet activity  ? ? ? ?Assist ? Wheelchair 150 feet activity did not occur: Safety/medical concerns ? ? ?   ? ?Blood pressure 122/74, pulse 73, temperature 98.3 ?F (36.8 ?C), resp. rate 17, height 5\' 8"  (1.727 m), weight 75.7 kg, SpO2 98 %. ? ?Medical Problem List and Plan: ?1. Functional deficits secondary to debility from  thoracic dissection of aneurysm  ?           vascular says pt healing well- CT of chest, abdomen and pelvis  at 4 weeks post op ? 4/22- goals SNF- Continue CIR- walked 57 ft RW CGA today- great improvement since treated UTI.  ?2.  Antithrombotics: ?-DVT/anticoagulation:  Pharmaceutical: Lovenox.  ?            -antiplatelet therapy: N/A ?3. Back pain: add kpad. continue oxycodone prn  ? 4/4- having a lot of lower chest and back pain- con't pain meds- will add lidocaine patches for night time for back pain.  ? 4/17- would like [place] to reduce pain meds/oxycodone- will change to 2.5 mg to 5 mg q4 hours prn esp for R ankle pain ? 4/18- holding meds, per pt- due to sedation. ?4/19- changed oxycodone to tramadol 50 mg BID prn ?4/22- pain much better today- con't tramadol prn ?4. Mood: LCSW to follow for evaluation and support.  ?            -antipsychotic agents: N/A ?5. Neuropsych: This patient may be  capable of making decisions on his own behalf. ?6. Skin/Wound Care: Routine pressure relief measures.  Monitor areas on right forearm/wrist for demarcation ?            -will add Vitamin C and Zinc to promote healing.  ?7. Fluids/Electrolytes/Nutrition: Monitor I/Os ?8. Ruptured  descending thoracic aneurysm s/p repair: BP management.  ?            --will need repeat CTA 4 weeks (monitoring SCr  for improvement) ? 4.17- will need CT of Chest/abd/pelvis at 4 weeks ?9. Acute on chronic renal failure: with new CKD Stage IV Improvement in BUN/SCr- 39/2.38 ?            --monitor with serial checks. Avoid nephrotoxic medications.  ? 3/31- Cr and BUN  stable con't to monitor weekly.  ? 4/3- Cr still 2.3-2.4- is stable- per Vascular wants pt kidney function to improve some, before CTA< however need to check for PE/so will do both ? 4/6- labs in AM ? 4/7- Cr 2.46- very slightly increased- will con't regimen ?  Creatinine 2.49 on 4/11, stable ? 4/17- Cr up to 2.94- and BUN up to 60 from 40- will give IVFs NS 75cc/hour x 24 hours- pt's intake poor. Will recheck BMP in AM  ? 4/18- Cr down to 2.78- but BUN up slightly- to 68- will continue IVFs today for another 24 hours and recheck in AM ? 4/19- Cr down to 2.33 and BUN 63- slightly improved- stop IVFs and recheck  ? 4/21- Cr down to 2.23- and BUN down to 54- will recheck Monday and give IVF if needed- con't to push fluids ?10. Acute blood loss anemia: Hgb reviewed and improving, monitor weekly.  ? Hemoglobin 9.3 on 4/11 ? 4/18- Hb up to 10 yesterday- will recheck in AM due to elevated BUN - making sure doesn't have GI bleed? Unlikely, but want to make sure not dropping ? 4/19- Hb 9.5- stable and even with hydration ?11. Hypoxia: continue oxygen via nasal cannula as needed  ?            --Off diuresis. Resp rate increase with minimal movement- will monitor O2 sats.  ? 3/31- monitor closely, since  SOB with minimal exertion. ?4/3- still requiring O2- will check for PE this evening- since pt's still has DOE.  ?4/4- Couldn't do CTA- so ordered VQ scabn- pending.   ?4/5- has PE- but no DVT- has filter from 1995- d/w pulmonary- risk of AC is worse than no AC- will hold off- due to high risk of bleeding profusely per vascular-  ?Asking palliative care to help with goals.  ? 4/6- pt wants to be full code right now palliative care ot see again today- family coming today.  ?4/7- O2 sats lower this AM- on RA- needed to be put back on O2 ?4/17- pt's sats good on 2L O2 ?12. ABLA: Improving with Epogen and thrombocytopenia has resolved.  Continue to monitor as needed.  ?13. Polysubstance abuse: Educated on importance of cessation ?             --smokes > 1 PPD and marijuana/cocaine-->Has planned to quit.  ?14. Obesity: BMI 30.- initially documented as BMI of 68- was wrong- Educate on diet and importance of weight loss to help prom

## 2021-09-03 NOTE — Progress Notes (Signed)
Occupational Therapy Session Note ? ?Patient Details  ?Name: Edward Hopkins ?MRN: [id number] ?Date of Birth: [birth year] ? ?Today's Date: 09/03/2021 ?OT Individual Time: 3557-3220 session 1  ?OT Individual Time Calculation (min): 43 min  ?Session 2: 1417-1447 ( 30 mins) ? ?Short Term Goals: ?Week 4:  OT Short Term Goal 1 (Week 4): STG=LTG 2/2 ELOS ? ?Skilled Therapeutic Interventions/Progress Updates:  ?Session 1: Pt greeted supine in bed agreeable to OT intervention. Session focus on BADL reeducation, functional mobility, increasing overall activity tolerance, BUE/BLE strengthening for higher level functional mobility tasks and decreasing overall caregiver burden.                    ?Pt completed supine>sit with supervision, CGA for sit>stand and MIN A for ambulatory transfer to w/c with Rw. Pt completed below therex with level 3 theraband:                 ?X20 BLE LAQs with level 3 theraband from sitting in w/c ?2min of seated marches; 30 secs rest break; 69min of seated marches ? ?X10 shoulder flexion to 90*  ?X10 bicep curls ?X10 shoulder horizontal ABD ?X10 shoulder extension  ?X10 alternating punches ? ?Pt ambulated to sink with MIN A with Rw for standing oral care with CGA. Pt completed functional ambulation to EOB with MIN A. Supervision for sit>supine. Pt left supine in bed with bed alarm activated and all needs within reach.  ?SpO2 >96% on RA during session  ? ?Session 2:  ?Pt greeted supine in bed initially declining session but agreeable to OT intervention with encouragement. Session focus on standing tolerance/ dynamic standing balance, and functional mobility as well as attention to task and problem solving to promote safety with ADL participation. Pt completed supine>sit with supervision, CGA for sit>stand and MIN A for ambulatory transfer to w/c. Pt transported to gym with total A for time mgmt. Worked on standing tolerance and dynamic balance via leggo therapeutic activity where pt instructed to create  leggo structure from visual aid provided. Pt needed MOD verbal cues to sequence correct placement of leggos and overall sequencing of task, pt noted to initially place leggos upside down needing cues to correct. Pt able to stand for ~ 3 min with no UE support with CGA. Pt reported fatigue after 3 mins requesting to complete task in sitting. Pt transported back to room in w/c with total A where pt completed stand pivot to [facility] with Rw and MINA. Pt left on BSC with NT present.               ?Therapy Documentation ?Precautions:  ?Precautions ?Precautions: Fall ?Precaution Comments: mild R hemipareisis, 2L O2, slow to initiate ?Restrictions ?Weight Bearing Restrictions: No ?RLE Weight Bearing: Weight bearing as tolerated ? ?Pain: ?Session 1: no pain reported  ?Session 2: unrated pain reported in RLE, rest breaks and distraction provided.  ?  ? ? ?Therapy/Group: Individual Therapy ? ?Precious Haws ?09/03/2021, 12:27 PM ?

## 2021-09-04 NOTE — Progress Notes (Signed)
Occupational Therapy Session Note ? ?Patient Details  ?Name: Edward Hopkins ?MRN: [id number] ?Date of Birth: 1950-05-28 ? ?Today's Date: 09/04/2021 ?OT Individual Time: 8527-7824 ?OT Individual Time Calculation (min): 54 min  ? ? ?Short Term Goals: ?Week 3:  OT Short Term Goal 1 (Week 3): sitting EOB, pt will don tshirt with min A. ?OT Short Term Goal 1 - Progress (Week 3): Met ?OT Short Term Goal 2 (Week 3): Pt will transfer to [facility] with min A using LRAD ?OT Short Term Goal 2 - Progress (Week 3): Met ?OT Short Term Goal 3 (Week 3): Pt will perform toileting tasks with mod A ?OT Short Term Goal 3 - Progress (Week 3): Progressing toward goal ?OT Short Term Goal 4 (Week 3): Pt will complete bathing tasks with mod A seated EOB or in w/c ?OT Short Term Goal 4 - Progress (Week 3): Met ? ?Skilled Therapeutic Interventions/Progress Updates:  ?  Pt greeted at time of session bed level resting leaning to the L, agreeable to OT session. No complaints of pain. Pt noted to have urine over flow in bed, states was not incontinent but spilled urinal. Focus of session on morning ADL routine with pt performing bed mobility CGA/MIN. Stand pivot bed > wheelchair MIN A with RW and posterior lean/extensor tone limiting during seated activity, cues for knee flexion and anterior weight shifting throughout. Set up at sink for sink level wash up as pt did not want to shower this am, wanting to wash up at sink. Pt performed oral hygiene Supervision and UB bathe/dress with CGA/Supervision and LB bathing sit > stand at sink with MOD to wash BLE past knee level. Pt standing at sink with CGA for bathing tasks. LB dressing with MOD as well to thread and sit > stand to don over hips. Pt ambulating approx 15 feet in room as pt wants to "stretch his legs" and did so with CGA and RW. Set up bed level alarm on call bell in reach.  ? ?Therapy Documentation ?Precautions:  ?Precautions ?Precautions: Fall ?Precaution Comments: mild R hemipareisis, 2L O2, slow  to initiate ?Restrictions ?Weight Bearing Restrictions: No ?RLE Weight Bearing: Weight bearing as tolerated ? ? ? ? ?Therapy/Group: Individual Therapy ? ?[name] ?09/04/2021, 7:14 AM ?

## 2021-09-04 NOTE — Progress Notes (Signed)
Palliative- ? ?Chart reviewed- patient improving, plan for SNF.  ? ?Palliative will sign off for now as goals of care have been established.  ? ?Please feel free to reconsult if further assistance is needed.  ? ?Mariana Kaufman, AGNP-C ?Palliative Medicine ? ?No charge ?

## 2021-09-04 NOTE — Progress Notes (Signed)
Physical Therapy Session Note ? ?Patient Details  ?Name: Edward Hopkins ?MRN: [id number] ?Date of Birth: 1950/09/23 ? ?Today's Date: 09/04/2021 ?PT Individual Time: 2458-0998 ?PT Individual Time Calculation (min): 30 min  ? ?Missed PT time: 45 min 2/2 fatigue, refusal to participate. ? ?Short Term Goals: ?Week 3:  PT Short Term Goal 1 (Week 3): =LTG due to ELOS ? ?Skilled Therapeutic Interventions/Progress Updates:  ? ?First session:  Pt presents supine in bed and agreeable to therapy.  Pt states pain to R ankle 7/10 but also hips 4/10.  Nursing requested for pain meds and will administer.  Pt transferred sup to sit using side rails w/ supervision.  Pt required mod A for sit to stand from lowered bed height 2/2 pain R ankle.  Pt amb to TIS w/ CGA x 10' only for LE there ex per pt request.  Ace wrap applied to R ankle.  Pt performed LAQ, hip flexion and abd/add, c/o pain to hips and LB.  Pt remained sitting in w/c w/ chair alarm on for lunch.  All needs in reach.  Pt remained on RA throughout session w/ O2 sats 99-100% throughout. ? ?Second session:  Pt presents Left sidelying in bed w/ lower legs off bed to right.  Pt unwilling to move, stating comfortable.  Pt states "you left me up" and unwilling to participate at this time.  Pt encouraged to participate for improved mobility for return to home but denies.  Pt remained in bed w/ bed alarm on and all needs in reach. ?   ? ?Therapy Documentation ?Precautions:  ?Precautions ?Precautions: Fall ?Precaution Comments: mild R hemipareisis, 2L O2, slow to initiate ?Restrictions ?Weight Bearing Restrictions: No ?RLE Weight Bearing: Weight bearing as tolerated ?General: ?PT Amount of Missed Time (min): 45 Minutes ?PT Missed Treatment Reason: Patient unwilling to participate;Patient fatigue ?Vital Signs: ?Therapy Vitals ?Temp: 98.5 ?F (36.9 ?C) ?Temp Source: Oral ?Pulse Rate: 84 ?Resp: 17 ?BP: 99/68 ?Patient Position (if appropriate): Lying ?Oxygen Therapy ?SpO2: 97 % ?O2 Device:  Room Air ?Pain: 7/10 unknown pain meds. ?  ?Mobility: ?  ? ? ? ? ?Therapy/Group: Individual Therapy ? ?[name] ?09/04/2021, 2:11 PM  ?

## 2021-09-05 DIAGNOSIS — N390 Urinary tract infection, site not specified: Secondary | ICD-10-CM

## 2021-09-05 DIAGNOSIS — B9620 Unspecified Escherichia coli [E. coli] as the cause of diseases classified elsewhere: Secondary | ICD-10-CM

## 2021-09-05 LAB — BASIC METABOLIC PANEL
Anion gap: 9 (ref 5–15)
BUN: 80 mg/dL — ABNORMAL HIGH (ref 8–23)
CO2: 27 mmol/L (ref 22–32)
Calcium: 9.4 mg/dL (ref 8.9–10.3)
Chloride: 98 mmol/L (ref 98–111)
Creatinine, Ser: 2.74 mg/dL — ABNORMAL HIGH (ref 0.61–1.24)
GFR, Estimated: 24 mL/min — ABNORMAL LOW (ref >60)
Glucose, Bld: 105 mg/dL — ABNORMAL HIGH (ref 70–99)
Potassium: 4.4 mmol/L (ref 3.5–5.1)
Sodium: 134 mmol/L — ABNORMAL LOW (ref 135–145)

## 2021-09-05 LAB — CBC
HCT: 33.1 % — ABNORMAL LOW (ref 39.0–52.0)
Hemoglobin: 10.4 g/dL — ABNORMAL LOW (ref 13.0–17.0)
MCH: 26.8 pg (ref 26.0–34.0)
MCHC: 31.4 g/dL (ref 30.0–36.0)
MCV: 85.3 fL (ref 80.0–100.0)
Platelets: 230 10*3/uL (ref 150–400)
RBC: 3.88 MIL/uL — ABNORMAL LOW (ref 4.22–5.81)
RDW: 15.7 % — ABNORMAL HIGH (ref 11.5–15.5)
WBC: 9.1 10*3/uL (ref 4.0–10.5)
nRBC: 0 % (ref 0.0–0.2)

## 2021-09-05 MED ORDER — AMOXICILLIN 500 MG PO CAPS: 500.0000 mg | ORAL_CAPSULE | Freq: Three times a day (TID) | ORAL | 0 refills | Status: DC

## 2021-09-05 MED ORDER — TRAMADOL HCL 50 MG PO TABS: 50.0000 mg | ORAL_TABLET | Freq: Two times a day (BID) | ORAL | 0 refills | Status: DC | PRN

## 2021-09-05 MED ORDER — DICLOFENAC SODIUM 1 % EX GEL
2.0000 g | Freq: Four times a day (QID) | CUTANEOUS | Status: DC
Start: 2021-09-05 — End: 2022-06-02

## 2021-09-05 MED ORDER — DARBEPOETIN ALFA 100 MCG/0.5ML IJ SOSY: 100.0000 ug | PREFILLED_SYRINGE | INTRAMUSCULAR | Status: DC

## 2021-09-05 MED ORDER — LIDOCAINE 5 % EX PTCH: 2.0000 | MEDICATED_PATCH | CUTANEOUS | 0 refills | Status: DC

## 2021-09-05 MED ORDER — AMOXICILLIN 250 MG PO CAPS
500.0000 mg | ORAL_CAPSULE | Freq: Two times a day (BID) | ORAL | Status: DC
  Administered 2021-09-06 – 2021-09-07 (×3): 500 mg via ORAL
  Filled 2021-09-05 (×3): qty 2

## 2021-09-05 MED ORDER — SODIUM CHLORIDE 0.9 % NICU IV INFUSION SIMPLE
INJECTION | INTRAVENOUS | Status: DC
  Filled 2021-09-05 (×5): qty 500

## 2021-09-05 NOTE — Progress Notes (Signed)
Occupational Therapy Session Note ? ?Patient Details  ?Name: Edward Hopkins ?MRN: [id number] ?Date of Birth: 07/12/1950 ? ?Today's Date: 09/05/2021 ?OT Individual Time: 0930-1040 ?OT Individual Time Calculation (min): 70 min  ? ? ?Short Term Goals: ?Week 4:  OT Short Term Goal 1 (Week 4): STG=LTG 2/2 ELOS ? ?Skilled Therapeutic Interventions/Progress Updates:  ?  Pt resting in bed upon arrival and agreeable to getting OOB. Supine>sit EOB with supervision using bed functions. Stand pivot transfer to w/c using RW with min A. Pt completed grooming tasks seated in w/c at sink. Pt amb with RW (min A) into hallway and returned to w/c in room-75'. On return to room pt c/o feeling "woozy." BP 144/89, HR 93, O2 99% on RA. Pt engaged in sit<>stand X 5 with CGA. Standing balance at sink with CGA. Pt engaged in BUE therex with 6# bar bell: curls 3x10, overhead presses 3x10, and chest presses 3x10. Pt remained seated in w/c for therex. Pt requested to return to bed. Pt amb with RW in room with min A to EOB. Sit>supine in bed with supervision. Pt remained in bed with all needs within reach and bed alarm activated.  ? ?Therapy Documentation ?Precautions:  ?Precautions ?Precautions: Fall ?Precaution Comments: mild R hemipareisis, 2L O2, slow to initiate ?Restrictions ?Weight Bearing Restrictions: No ?RLE Weight Bearing: Weight bearing as tolerated ?  ?Pain: ? Pt reports Rt foot tenderness but "ok" ? ? ?Therapy/Group: Individual Therapy ? ?[name] ?09/05/2021, 10:42 AM ?

## 2021-09-05 NOTE — Progress Notes (Addendum)
?                                                       PROGRESS NOTE ? ? ?Subjective/Complaints: ? ?Feels like he's progressing. Has noticed a bulge in his left inguinal area when he stands. Concerned that it's related to his aneurysm.  ? ?ROS: Patient denies fever, rash, sore throat, blurred vision, dizziness, nausea, vomiting, diarrhea, cough, shortness of breath or chest pain,   headache, or mood change.  ? ? ?Objective: ?  ?No results found. ?Recent Labs  ?  09/05/21 ?0624  ?WBC 9.1  ?HGB 10.4*  ?HCT 33.1*  ?PLT 230  ? ? ? ?Recent Labs  ?  09/05/21 ?0624  ?NA 134*  ?K 4.4  ?CL 98  ?CO2 27  ?GLUCOSE 105*  ?BUN 80*  ?CREATININE 2.74*  ?CALCIUM 9.4  ? ? ? ? ?Intake/Output Summary (Last 24 hours) at 09/05/2021 1247 ?Last data filed at 09/05/2021 7619 ?Gross per 24 hour  ?Intake 356 ml  ?Output 450 ml  ?Net -94 ml  ?  ? ?  ? ?Physical Exam: ?Vital Signs ?Blood pressure 130/76, pulse 80, temperature 98.6 ?F (37 ?C), temperature source Oral, resp. rate 18, height 5\' 8"  (1.727 m), weight 75.9 kg, SpO2 100 %. ? ? ? ? ? ? ?Constitutional: No distress . Vital signs reviewed. ?HEENT: NCAT, EOMI, oral membranes moist ?Neck: supple ?Cardiovascular: RRR without murmur. No JVD    ?Respiratory/Chest: CTA Bilaterally without wheezes or rales. Normal effort    ?GI/Abdomen: BS +, non-tender, non-distended ?Ext: no clubbing, cyanosis, or edema ?Psych: pleasant and cooperative  ?Neurological: alert- MUCH more awake, interactive ?MS: R ankle mild swelling- TTP medial and lateral malleolus, mild abdominal wall tenderness. ?Mild bulge left inguinal area, minimally tender ?Motor: LUE 5/5 ?RUE 4+/5 delt bi, tri grip ?LLE- 5/5 ?RLE- 5-/5- weaker on R side, stable ? ?Assessment/Plan: ?1. Functional deficits which require 3+ hours per day of interdisciplinary therapy in a comprehensive inpatient rehab setting. ?Physiatrist is providing close team supervision and 24 hour management of active medical problems listed below. ?Physiatrist and  rehab team continue to assess barriers to discharge/monitor patient progress toward functional and medical goals ? ?Care Tool: ? ?Bathing ?   ?Body parts bathed by patient: Right arm, Left arm, Chest, Abdomen, Front perineal area, Buttocks, Right upper leg, Left upper leg, Face  ? Body parts bathed by helper: Right lower leg, Left lower leg ?  ?  ?Bathing assist Assist Level: Moderate Assistance - Patient 50 - 74% ?  ?  ?Upper Body Dressing/Undressing ?Upper body dressing   ?What is the patient wearing?: Pull over shirt ?   ?Upper body assist Assist Level: Contact Guard/Touching assist ?   ?Lower Body Dressing/Undressing ?Lower body dressing ? ? ?   ?What is the patient wearing?: Pants ? ?  ? ?Lower body assist Assist for lower body dressing: Moderate Assistance - Patient 50 - 74% ?   ? ?Toileting ?Toileting    ?Toileting assist Assist for toileting: Total Assistance - Patient < 25% ?  ?  ?Transfers ?Chair/bed transfer ? ?Transfers assist ?   ? ?Chair/bed transfer assist level: Minimal Assistance - Patient > 75% ?Chair/bed transfer assistive device: Walker ?  ?Locomotion ?Ambulation ? ? ?Ambulation assist ? ? Ambulation activity did not occur: Safety/medical concerns ? ?  Assist level: Contact Guard/Touching assist ?Assistive device: Walker-rolling ?Max distance: 10  ? ?Walk 10 feet activity ? ? ?Assist ? Walk 10 feet activity did not occur: Safety/medical concerns ? ?Assist level: Contact Guard/Touching assist ?Assistive device: Walker-rolling  ? ?Walk 50 feet activity ? ? ?Assist Walk 50 feet with 2 turns activity did not occur: Safety/medical concerns ? ?Assist level: Contact Guard/Touching assist ?Assistive device: Walker-rolling  ? ? ?Walk 150 feet activity ? ? ?Assist Walk 150 feet activity did not occur: Safety/medical concerns ? ?  ?  ?  ? ?Walk 10 feet on uneven surface  ?activity ? ? ?Assist Walk 10 feet on uneven surfaces activity did not occur: Safety/medical concerns ? ? ?  ?    ? ?Wheelchair ? ? ? ? ?Assist Is the patient using a wheelchair?: Yes ?Type of Wheelchair: Manual ?  ? ?Wheelchair assist level: Dependent - Patient 0% ?   ? ? ?Wheelchair 50 feet with 2 turns activity ? ? ? ?Assist ? ?  ?Wheelchair 50 feet with 2 turns activity did not occur: Safety/medical concerns ? ? ?   ? ?Wheelchair 150 feet activity  ? ? ? ?Assist ? Wheelchair 150 feet activity did not occur: Safety/medical concerns ? ? ?   ? ?Blood pressure 130/76, pulse 80, temperature 98.6 ?F (37 ?C), temperature source Oral, resp. rate 18, height 5\' 8"  (1.727 m), weight 75.9 kg, SpO2 100 %. ? ?Medical Problem List and Plan: ?1. Functional deficits secondary to debility from  thoracic dissection of aneurysm  ?           vascular says pt healing well- CT of chest, abdomen and pelvis  at 4 weeks post op ? 4/24- SNF tomorrow? Continue CIR-   ?2.  Antithrombotics: ?-DVT/anticoagulation:  Pharmaceutical: Lovenox.  ?            -antiplatelet therapy: N/A ?3. Back pain: add kpad. continue oxycodone prn  ? 4/4- having a lot of lower chest and back pain- con't pain meds- will add lidocaine patches for night time for back pain.  ? 4/17- would like [place] to reduce pain meds/oxycodone- will change to 2.5 mg to 5 mg q4 hours prn esp for R ankle pain ? 4/18- holding meds, per pt- due to sedation. ?4/19- changed oxycodone to tramadol 50 mg BID prn ? 4/24- pain improved con't tramadol prn ? "Bulge" in left inguinal area may be small hernia ?4. Mood: LCSW to follow for evaluation and support.  ?            -antipsychotic agents: N/A ?5. Neuropsych: This patient may be  capable of making decisions on his own behalf. ?6. Skin/Wound Care: Routine pressure relief measures.  Monitor areas on right forearm/wrist for demarcation ?            -will add Vitamin C and Zinc to promote healing.  ?7. Fluids/Electrolytes/Nutrition: Monitor I/Os ?8. Ruptured  descending thoracic aneurysm s/p repair: BP management.  ?            --will need repeat CTA 4  weeks (monitoring SCr  for improvement) ? 4.17- will need CT of Chest/abd/pelvis at 4 weeks ?9. Acute on chronic renal failure: with new CKD Stage IV Improvement in BUN/SCr- 39/2.38 ?            --monitor with serial checks. Avoid nephrotoxic medications.  ? 3/31- Cr and BUN stable con't to monitor weekly.  ? 4/3- Cr still 2.3-2.4- is stable- per Vascular wants pt kidney  function to improve some, before CTA< however need to check for PE/so will do both ? 4/6- labs in AM ? 4/7- Cr 2.46- very slightly increased- will con't regimen ? Creatinine 2.49 on 4/11, stable ? 4/17- Cr up to 2.94- and BUN up to 60 from 40- will give IVFs NS 75cc/hour x 24 hours- pt's intake poor. Will recheck BMP in AM  ? 4/18- Cr down to 2.78- but BUN up slightly- to 68- will continue IVFs today for another 24 hours and recheck in AM ? 4/19- Cr down to 2.33 and BUN 63- slightly improved- stop IVFs and recheck  ? 4/21- Cr down to 2.23- and BUN down to 54- will recheck Monday and give IVF if needed- con't to push fluids ? 4/24 BUN/Cr up to 80/2.74---begin IV NS today 75cc/hr, EF 60% ?10. Acute blood loss anemia: Hgb reviewed and improving, monitor weekly.  ? Hemoglobin 9.3 on 4/11 ? 4/18- Hb up to 10 yesterday- will recheck in AM due to elevated BUN - making sure doesn't have GI bleed? Unlikely, but want to make sure not dropping ? 4/24 hgb 10.4.  may be partly due to dehydration ?11. Hypoxia: continue oxygen via nasal cannula as needed  ?            --Off diuresis. Resp rate increase with minimal movement- will monitor O2 sats.  ? 3/31- monitor closely, since  SOB with minimal exertion. ?4/3- still requiring O2- will check for PE this evening- since pt's still has DOE.  ?4/4- Couldn't do CTA- so ordered VQ scabn- pending.   ?4/5- has PE- but no DVT- has filter from 1995- d/w pulmonary- risk of AC is worse than no AC- will hold off- due to high risk of bleeding profusely per vascular-  ?Asking palliative care to help with goals.  ? 4/6- pt wants  to be full code right now palliative care ot see again today- family coming today.  ?4/7- O2 sats lower this AM- on RA- needed to be put back on O2 ?4/17- pt's sats good on 2L O2 ?12. ABLA: Improving with Epogen and thrombocytopenia h

## 2021-09-05 NOTE — Progress Notes (Signed)
PHARMACY NOTE:  ANTIMICROBIAL RENAL DOSAGE ADJUSTMENT ? ?Current antimicrobial regimen includes a mismatch between antimicrobial dosage and estimated renal function.  As per policy approved by the Pharmacy & Therapeutics and Medical Executive Committees, the antimicrobial dosage will be adjusted accordingly. ? ?Current antimicrobial dosage:  amoxicillin 500mg  PO q8h until 4/26 ? ?Indication: UTI ? ?Renal Function: ? ?Estimated Creatinine Clearance: 23.2 mL/min (A) (by C-G formula based on SCr of 2.74 mg/dL (H)). ?[]      On intermittent HD, scheduled: ?[]      On CRRT ?   ?Antimicrobial dosage has been changed to:  amoxicillin 500mg  PO q12h until 4/26 ? ?Additional comments: ? ?Edward Hopkins, PharmD, BCPS, FNKF ?Clinical Pharmacist ?[place] ?Please utilize Amion for appropriate phone number to reach the unit pharmacist ([place]) ? ?09/05/2021 6:40 PM ?

## 2021-09-05 NOTE — Progress Notes (Signed)
Physical Therapy Discharge Summary ? ?Patient Details  ?Name: Edward Hopkins ?MRN: [id number] ?Date of Birth: Jun 11, 1950 ? ?Today's Date: 09/05/2021 ?PT Individual Time: 2440-1027 ?PT Individual Time Calculation (min): 50 min  ? ? ?Patient has met 7 of 10 long term goals due to improved activity tolerance, improved balance, increased strength, ability to compensate for deficits, and functional use of  right upper extremity and right lower extremity.  Patient to discharge at an ambulatory level [place].   Patient's care partner unavailable to provide the necessary physical assistance at discharge. Pt dc'ing to SNF ? ?Reasons goals not met: pt missed minutes due to pain and fatigue, throughout his stay; car transfer not performed. ? ?Recommendation:  ?Patient will benefit from ongoing skilled PT services in skilled nursing facility setting to continue to advance safe functional mobility, address ongoing impairments in AROM, PROM, strength, motor control, endurance, mobility and locomotion, cognition, and minimize fall risk. ? ?Equipment: ?No equipment provided; has ACE for R ankle ? ?Reasons for discharge: treatment goals met and discharge from hospital ? ?Patient/family agrees with progress made and goals achieved: Yes ? ?PT Discharge ?Pt received resting in bed.  He reported that he is discharging tomorrow to SNF.  PT wrapped his R ankle with ACE, which he stated helps with pain, especially when bearing weight. Pt participated in d/c assessment until LB pain increased.  Pt has limited endurance for mobility due to long-standing LB and bil hip pain; he recognizes that his performance changes day to day. At end of session, pt resting on R side in bed, with pillow placed between knees.  He declined ice pack to R ankle. Bed alarm set and needs left at hand ?Precautions/Restrictions ?Restrictions ?Weight Bearing Restrictions: No ?RLE Weight Bearing: Weight bearing as tolerated ?Other Position/Activity Restrictions: R  ankle soft tissue injury ?Vital Signs- pt on room air throughout session.  Mild DOE by end of gait training.  ?Therapy Vitals ?Temp: 98.3 ?F (36.8 ?C) ?Temp Source: Oral ?Pulse Rate: 84 ?Resp: 18 ?BP: 118/77 ?Patient Position (if appropriate): Lying ?Oxygen Therapy ?SpO2: 96 % ?O2 Device: Room Air ?Pain ?Pain Assessment ?Pain Scale: 0-10 ?Pain Score: 0-No pain ?Faces Pain Scale: Hurts a little bit ?Pain Location: Ankle ?Pain Orientation: Right (also LB and bil hips, chronic) ?Pain Intervention(s): Medication (See eMAR) ?Pain Interference ?Pain Interference ?Pain Effect on Sleep: 1. Rarely or not at all ?Pain Interference with Therapy Activities: 3. Frequently ?Pain Interference with Day-to-Day Activities: 2. Occasionally ?Vision/Perception  ?Vision - History ?Ability to See in Adequate Light: 0 Adequate  ?No change from baseline per pt ?Cognition ?Arousal/Alertness: Lethargic ?Orientation Level: Oriented X4 ?Year: 2023 ?Month: April ?Focused Attention: Impaired ?Memory: Impaired ?Memory Impairment: Decreased recall of new information (pt unable to recall which side was affected by CVA) ?Problem Solving: Impaired ?Sensation ?Sensation ?Light Touch: Appears Intact ?Proprioception: Appears Intact ?Coordination ?Gross Motor Movements are Fluid and Coordinated: No ?Fine Motor Movements are Fluid and Coordinated: No ?Heel Shin Test: refused RLE; very limited excursion LLE ?Motor  ?Motor ?Motor: Hemiplegia ?Motor - Discharge Observations: pt has pre-morbid gait deviations and abnormal postural alignment  ?Mobility ?Bed Mobility ?Rolling Right: Independent (partially sits up) ?Supine to Sit: Supervision/Verbal cueing ?Sit to Supine: Minimal Assistance - Patient > 75% ?Transfers ?Transfers: Sit to Stand;Stand to [employer] Transfers ?Sit to Stand: Contact Guard/Touching assist (from raised surface) ?Stand to Sit: Contact Guard/Touching assist ?Stand Pivot Transfer Details: Verbal cues for precautions/safety ?Transfer  (Assistive device): Rolling walker ?Locomotion  ?Gait ?Ambulation: Yes ?  Gait Assistance: Contact Guard/Touching assist ?Gait Distance (Feet): 80 Feet ?Assistive device: Rolling walker ?Gait ?Gait: Yes ?Gait Pattern: Impaired ?Gait Pattern: Decreased weight shift to right;Decreased hip/knee flexion - right;Decreased dorsiflexion - right;Narrow base of support;Decreased trunk rotation;Step-through pattern;Antalgic ?Gait velocity: significantly slowed for age ?Stairs / Additional Locomotion ?Stairs: No ?Pick up small object from the floor (from standing position) activity did not occur: Refused ?Wheelchair Mobility ?Wheelchair Mobility: Yes ?Wheelchair Assistance: Supervision/Verbal cueing ?Wheelchair Propulsion: Both upper extremities ?Wheelchair Parts Management: Needs assistance ?Distance: 25  ?Trunk/Postural Assessment  ?Cervical Assessment ?Cervical Assessment: Exceptions to [facility] (forward head) ?Thoracic Assessment ?Thoracic Assessment: Exceptions to [facility] (rounded shoulders) ?Lumbar Assessment ?Lumbar Assessment: Exceptions to [facility] (posterior pelvic tilt) ?Postural Control ?Postural Control: Within Functional Limits  ?Balance ?Balance ?Balance Assessed: Yes ?Static Sitting Balance ?Static Sitting - Balance Support: Feet supported;No upper extremity supported ?Static Sitting - Level of Assistance: 3: Mod assist;5: Stand by assistance (posterior bias without BUE to support behind him) ?Dynamic Sitting Balance ?Dynamic Sitting - Balance Support: Feet supported;No upper extremity supported ?Dynamic Sitting - Level of Assistance: 3: Mod assist;5: Stand by assistance ?Static Standing Balance ?Static Standing - Balance Support: Bilateral upper extremity supported;During functional activity ?Static Standing - Level of Assistance: 4: Min assist;5: Stand by assistance ?Dynamic Standing Balance ?Dynamic Standing - Balance Support: Bilateral upper extremity supported;During functional activity ?Dynamic Standing - Level of  Assistance: 3: Mod assist;5: Stand by assistance ?Extremity Assessment  ?  ?  ?RLE Assessment ?RLE Assessment: Exceptions to [facility] ?Passive Range of Motion (PROM) Comments: NT, but limited in all planes due to hx septic arthritis 1995 ?RLE Strength ?RLE Overall Strength Comments: grossly in sitting: hip flexion, knee extension, knee flexion 4-/5 , toe extension 2-/5; unable to perform ankle DF due to painful R ankle ?LLE Assessment ?LLE Assessment: Exceptions to [facility] ?Passive Range of Motion (PROM) Comments: NT, but limited in all planes in hips due hx septic arthritis 1995 ?LLE Strength ?LLE Overall Strength Comments: grossly in sitting (sacral, due to limited bil hip PRROM) hip flexion 4-/5, hip abduction 4/5, knee extension, knee flexion, ankle DF 4+/5 ? ? ? ?Renada Cronin ?09/05/2021, 5:09 PM ?

## 2021-09-05 NOTE — Progress Notes (Signed)
Physical Therapy Session Note ? ?Patient Details  ?Name: Edward Hopkins ?MRN: [id number] ?Date of Birth: 1951-05-06 ? ?Today's Date: 09/05/2021 ?PT Individual Time: 2595-6387 ?PT Individual Time Calculation (min): 50 min  ? ?Short Term Goals: ?Week 2:  PT Short Term Goal 1 (Week 2): =LTG due to ELOS ?PT Short Term Goal 1 - Progress (Week 2): Progressing toward goal ?Week 3:  PT Short Term Goal 1 (Week 3): =LTG due to ELOS ?   ? ?Skilled Therapeutic Interventions/Progress Updates:  ?Pt received resting in bed.  He reported that he is discharging tomorrow to SNF.  PT wrapped his R ankle with ACE, which he stated helps with pain, especially when bearing weight. Pt participated in d/c assessment until LB pain increased.  Pt has limited endurance for mobility due to long-standing LB and bil hip pain; he recognizes that his performance changes day to day. At end of session, pt resting on R side in bed, with pillow placed between knees.  He declined ice pack to R ankle. Bed alarm set and needs left at hand. ?   ? ?Therapy Documentation ?Precautions:  ?Precautions ?Precautions: Fall ?Precaution Comments: mild R hemipareisis,  slow to initiate ?Restrictions ?Weight Bearing Restrictions: No ?RLE Weight Bearing: Weight bearing as tolerated ?Other Position/Activity Restrictions: R ankle soft tissue injury ? ?PT Amount of Missed Time (min): 25 Minutes ?PT Missed Treatment Reason: Patient unwilling to participate;Pain ?Vital Signs: on room air, slightly DOE at 97' distance during gait training ?  ?Pain: ?Pain Assessment ?Pain Scale: 0-10 ?Pain Score: 0-No pain ?Faces Pain Scale: Hurts a little bit ?Pain Location: Ankle ?Pain Orientation: Right (also LB and bil hips, chronic) ?Pain Intervention(s): Medication (See eMAR) ?Mobility: ?Bed Mobility ?Rolling Right: Independent (partially sits up) ?Supine to Sit: Supervision/Verbal cueing ?Sit to Supine: Minimal Assistance - Patient > 75% ?Transfers ?Transfers: Sit to Stand;Stand to  [employer] Transfers ?Sit to Stand: Contact Guard/Touching assist (from raised surface) ?Stand to Sit: Contact Guard/Touching assist ?Stand Pivot Transfer Details: Verbal cues for precautions/safety ?Transfer (Assistive device): Rolling walker ?Locomotion : ?Gait ?Ambulation: Yes ?Gait Assistance: Contact Guard/Touching assist ?Gait Distance (Feet): 80 Feet ?Assistive device: Rolling walker ?Gait ?Gait: Yes ?Gait Pattern: Impaired ?Gait Pattern: Decreased weight shift to right;Decreased hip/knee flexion - right;Decreased dorsiflexion - right;Narrow base of support;Decreased trunk rotation;Step-through pattern;Antalgic ?Gait velocity: significantly slowed for age ?Stairs / Additional Locomotion ?Stairs: No ?Wheelchair Mobility ?Wheelchair Mobility: Yes ?Wheelchair Assistance: Supervision/Verbal cueing ?Wheelchair Propulsion: Both upper extremities ?Wheelchair Parts Management: Needs assistance ?Distance: 25  ?Trunk/Postural Assessment : ?Cervical Assessment ?Cervical Assessment: Exceptions to [facility] (forward head) ?Thoracic Assessment ?Thoracic Assessment: Exceptions to [facility] (rounded shoulders) ?Lumbar Assessment ?Lumbar Assessment: Exceptions to [facility] (posterior pelvic tilt) ?Postural Control ?Postural Control: Within Functional Limits  ?Balance: ?Balance ?Balance Assessed: Yes ?Static Sitting Balance ?Static Sitting - Balance Support: Feet supported;No upper extremity supported ?Static Sitting - Level of Assistance: 3: Mod assist;5: Stand by assistance (posterior bias without BUE to support behind him) ?Dynamic Sitting Balance ?Dynamic Sitting - Balance Support: Feet supported;No upper extremity supported ?Dynamic Sitting - Level of Assistance: 3: Mod assist;5: Stand by assistance ?Static Standing Balance ?Static Standing - Balance Support: Bilateral upper extremity supported;During functional activity ?Static Standing - Level of Assistance: 4: Min assist;5: Stand by assistance ?Dynamic Standing Balance ?Dynamic  Standing - Balance Support: Bilateral upper extremity supported;During functional activity ?Dynamic Standing - Level of Assistance: 3: Mod assist;5: Stand by assistance ?  ? ? ? ?Therapy/Group: Individual Therapy ? ?Edward Hopkins ?09/05/2021, 2:51 PM  ?

## 2021-09-05 NOTE — Progress Notes (Addendum)
Patient ID: Edward Hopkins, male   DOB: 1950/10/18, 71 y.o.   MRN: [id number] ? ?SW spoke with Emily/Admissions to inform on change in SNF location due to distance.  ? ?SW spoke with [facility] [phone]) reporting pt approved for SNF placement with auth beginning on 4/24-4/26 and next review date due on 4/25. Reports auth # for billing has not generated yet. Assigned CM is Darnelle Bos and fax [phone]) ? ?SW spoke for [facility] to inform on above. SW will confirm pt d/c tomorrow. Pt will need COVID test. Transportation to be scheduled for 10am.  ? ?SW schduled ambulance transport for 10am p/u with [facility]. SW updated medical team. SW left message for pt s/o [name] informing in SNF location and d/c time tomorrow. SW met with pt and s/o [name] in room to inform on his discharge tomorrow. She has clothes in the room for pt to take upon transport.    ? ?SW spoke with [name]/[facility] [phone]) to inform on above.  ? ?[name], MSW, LCSWA ?Office: [phone] ?Cell: [phone] ?Fax: [phone]  ?

## 2021-09-06 LAB — BASIC METABOLIC PANEL
Anion gap: 9 (ref 5–15)
BUN: 74 mg/dL — ABNORMAL HIGH (ref 8–23)
CO2: 26 mmol/L (ref 22–32)
Calcium: 9.2 mg/dL (ref 8.9–10.3)
Chloride: 100 mmol/L (ref 98–111)
Creatinine, Ser: 2.42 mg/dL — ABNORMAL HIGH (ref 0.61–1.24)
GFR, Estimated: 28 mL/min — ABNORMAL LOW (ref >60)
Glucose, Bld: 110 mg/dL — ABNORMAL HIGH (ref 70–99)
Potassium: 4.5 mmol/L (ref 3.5–5.1)
Sodium: 135 mmol/L (ref 135–145)

## 2021-09-06 LAB — SARS CORONAVIRUS 2 (TAT 6-24 HRS): SARS Coronavirus 2: NEGATIVE

## 2021-09-06 MED ORDER — AMOXICILLIN 500 MG PO CAPS: 500.0000 mg | ORAL_CAPSULE | Freq: Two times a day (BID) | ORAL | Status: AC

## 2021-09-06 MED ORDER — SODIUM CHLORIDE 0.9 % IV SOLN: INTRAVENOUS | Status: AC

## 2021-09-06 MED ORDER — TRAMADOL HCL 50 MG PO TABS: 50.0000 mg | ORAL_TABLET | Freq: Two times a day (BID) | ORAL | 0 refills | Status: DC | PRN

## 2021-09-06 NOTE — Progress Notes (Addendum)
Inpatient Rehabilitation Discharge Medication Review by a Pharmacist ? ?A complete drug regimen review was completed for this patient to identify any potential clinically significant medication issues. ? ?High Risk Drug Classes Is patient taking? Indication by Medication  ?Antipsychotic No   ?Anticoagulant No   ?Antibiotic Yes Amoxil - UTI  ?Opioid Yes Ultram - acute pain  ?Antiplatelet Yes bASA - CVA ppx  ?Hypoglycemics/insulin No   ?Vasoactive Medication Yes Lasix - diuresis  ?Chemotherapy No   ?Other Yes Remeron - MDD ?Trazodone, melatonin - sleep ?Protonix - GERD ?Reglan - nausea  ?Aranesp - chronic anemia 2/2 CKD stage IV ?Folic acid, vit C, Juven, MIV, thiamine, zinc sulfate - supplementation ?Duoneb - SOB  ? ? ? ?Type of Medication Issue Identified Description of Issue Recommendation(s)  ?Drug Interaction(s) (clinically significant) ?    ?Duplicate Therapy ?    ?Allergy ?    ?No Medication Administration End Date ? Amoxil Stop Date: 09/08/2021  ?Incorrect Dose ?    ?Additional Drug Therapy Needed ?    ?Significant med changes from prior encounter (inform family/care partners about these prior to discharge).    ?Other ?    ? ? ?Clinically significant medication issues were identified that warrant physician communication and completion of prescribed/recommended actions by midnight of the next day:  No ? ?Time spent performing this drug regimen review (minutes):  30 ? ? ?Edward Hopkins ?09/06/2021 11:58 AM ? ?------------------------------------------------------------------------------------------------------------------ ?I agree with the content of this note ? ?Mairyn Lenahan BS, PharmD, BCPS ?Clinical Pharmacist ?09/06/2021 12:20 PM ? ?Contact: [phone] after 3 PM ? ?"Be curious, not judgmental..." -Jamal Maes ?

## 2021-09-06 NOTE — Progress Notes (Signed)
Patient ID: PANAYIOTIS RAINVILLE, male   DOB: 08-07-1950, 71 y.o.   MRN: [id number] ? ?SW spoke with Carol/Admissions with [facility] confirming pt can still be admitted today. Reports pt can admit, and pt will go to Rm #[id number]. SW informed pt assigned RN on above.  ? ?*Per attending, pt not able to d/c as he needs IVF. Likely to d/c tomorrow. Medical team aware.  ? ?SW updated Carol/Admissions with [facility] on changes. Pt will need new auth if he does not admit tomorrow. ? ?SW updated pt s/o [name] on changes and will update if he will still leave tomorrow. SW cancelled ambulance p/u today with [facility] and changed p/u for tomorrow at 10am since pt on hold.  ? ?[name], MSW, LCSWA ?Office: [phone] ?Cell: [phone] ?Fax: [phone]  ?

## 2021-09-06 NOTE — Progress Notes (Signed)
?                                                       PROGRESS NOTE ? ? ?Subjective/Complaints: ? ?Pt reports LBM he thinks was yesterday.  ?R ankle still hurts/throbbing; ate [employer] yesterday and ate good for breakfast ~50%.  ?Still on IVFs- will hold SNF d/c 1 day- since BUN still elevated evne after IVFs.  ? ? ?ROS:  ? ?Pt denies SOB, abd pain, CP, N/V/C/D, and vision changes ? ? ? ?Objective: ?  ?No results found. ?Recent Labs  ?  09/05/21 ?0624  ?WBC 9.1  ?HGB 10.4*  ?HCT 33.1*  ?PLT 230  ? ? ? ?Recent Labs  ?  09/05/21 ?0624 09/06/21 ?8299  ?NA 134* 135  ?K 4.4 4.5  ?CL 98 100  ?CO2 27 26  ?GLUCOSE 105* 110*  ?BUN 80* 74*  ?CREATININE 2.74* 2.42*  ?CALCIUM 9.4 9.2  ? ? ? ? ?Intake/Output Summary (Last 24 hours) at 09/06/2021 0853 ?Last data filed at 09/06/2021 0111 ?Gross per 24 hour  ?Intake 240 ml  ?Output 1125 ml  ?Net -885 ml  ?  ? ?  ? ?Physical Exam: ?Vital Signs ?Blood pressure (!) 141/80, pulse 76, temperature 99 ?F (37.2 ?C), resp. rate 16, height 5\' 8"  (1.727 m), weight 77.7 kg, SpO2 100 %. ? ? ? ? ? ? ? ?General: awake, alert, appropriate,on phone, lying in bed;  NAD ?HENT: conjugate gaze; oropharynx moist ?CV: regular rate; no JVD ?Pulmonary: CTA B/L; no W/R/R- good air movement ?GI: soft, NT, ND, (+)BS ?Psychiatric: appropriate ?Neurological:much more awake last few days-  ?MS: R ankle mild swelling- TTP medial and lateral malleolus- also pain/TTP over top of ankle on R, mild abdominal wall tenderness. ?Mild bulge left inguinal area, minimally tender ?Motor: LUE 5/5 ?RUE 4+/5 delt bi, tri grip ?LLE- 5/5 ?RLE- 5-/5- weaker on R side, stable ?Extremities: on IVFs- no LE edema except R ankle swelling ? ?Assessment/Plan: ?1. Functional deficits which require 3+ hours per day of interdisciplinary therapy in a comprehensive inpatient rehab setting. ?Physiatrist is providing close team supervision and 24 hour management of active medical problems listed below. ?Physiatrist and rehab team continue  to assess barriers to discharge/monitor patient progress toward functional and medical goals ? ?Care Tool: ? ?Bathing ?   ?Body parts bathed by patient: Right arm, Left arm, Chest, Abdomen, Front perineal area, Buttocks, Right upper leg, Left upper leg, Face  ? Body parts bathed by helper: Right lower leg, Left lower leg ?  ?  ?Bathing assist Assist Level: Moderate Assistance - Patient 50 - 74% ?  ?  ?Upper Body Dressing/Undressing ?Upper body dressing   ?What is the patient wearing?: Pull over shirt ?   ?Upper body assist Assist Level: Contact Guard/Touching assist ?   ?Lower Body Dressing/Undressing ?Lower body dressing ? ? ?   ?What is the patient wearing?: Pants ? ?  ? ?Lower body assist Assist for lower body dressing: Moderate Assistance - Patient 50 - 74% ?   ? ?Toileting ?Toileting    ?Toileting assist Assist for toileting: Maximal Assistance - Patient 25 - 49% ?  ?  ?Transfers ?Chair/bed transfer ? ?Transfers assist ?   ? ?Chair/bed transfer assist level: Supervision/Verbal cueing ?Chair/bed transfer assistive device: Walker ?  ?Locomotion ?Ambulation ? ? ?  Ambulation assist ? ? Ambulation activity did not occur: Safety/medical concerns ? ?Assist level: Contact Guard/Touching assist ?Assistive device: Walker-rolling ?Max distance: 47  ? ?Walk 10 feet activity ? ? ?Assist ? Walk 10 feet activity did not occur: Safety/medical concerns ? ?Assist level: Contact Guard/Touching assist ?Assistive device: Walker-rolling  ? ?Walk 50 feet activity ? ? ?Assist Walk 50 feet with 2 turns activity did not occur: Safety/medical concerns ? ?Assist level: Contact Guard/Touching assist ?Assistive device: Walker-rolling  ? ? ?Walk 150 feet activity ? ? ?Assist Walk 150 feet activity did not occur: Safety/medical concerns (LB pain) ? ?  ?  ?  ? ?Walk 10 feet on uneven surface  ?activity ? ? ?Assist Walk 10 feet on uneven surfaces activity did not occur: Refused ? ? ?  ?   ? ?Wheelchair ? ? ? ? ?Assist Is the patient using a  wheelchair?: Yes ?Type of Wheelchair: Manual ?  ? ?Wheelchair assist level: Supervision/Verbal cueing ?Max wheelchair distance: 25  ? ? ?Wheelchair 50 feet with 2 turns activity ? ? ? ?Assist ? ?  ?Wheelchair 50 feet with 2 turns activity did not occur: Refused ? ? ?   ? ?Wheelchair 150 feet activity  ? ? ? ?Assist ? Wheelchair 150 feet activity did not occur: Refused ? ? ?   ? ?Blood pressure (!) 141/80, pulse 76, temperature 99 ?F (37.2 ?C), resp. rate 16, height 5\' 8"  (1.727 m), weight 77.7 kg, SpO2 100 %. ? ?Medical Problem List and Plan: ?1. Functional deficits secondary to debility from  thoracic dissection of aneurysm  ?           vascular says pt healing well- CT of chest, abdomen and pelvis  at 4 weeks post op ? 4/25- Will hold d/c today due to elevated BUN/Cr- just 1 day- con't CIR; team conference today to finalize d/c.   ?2.  Antithrombotics: ?-DVT/anticoagulation:  Pharmaceutical: Lovenox.  ?            -antiplatelet therapy: N/A ?3. Back pain: add kpad. continue oxycodone prn  ? 4/4- having a lot of lower chest and back pain- con't pain meds- will add lidocaine patches for night time for back pain.  ? 4/17- would like [place] to reduce pain meds/oxycodone- will change to 2.5 mg to 5 mg q4 hours prn esp for R ankle pain ? 4/18- holding meds, per pt- due to sedation. ?4/19- changed oxycodone to tramadol 50 mg BID prn ? 4/24- pain improved con't tramadol prn ? "Bulge" in left inguinal area may be small hernia ?4. Mood: LCSW to follow for evaluation and support.  ?            -antipsychotic agents: N/A ?5. Neuropsych: This patient may be  capable of making decisions on his own behalf. ?6. Skin/Wound Care: Routine pressure relief measures.  Monitor areas on right forearm/wrist for demarcation ?            -will add Vitamin C and Zinc to promote healing.  ?7. Fluids/Electrolytes/Nutrition: Monitor I/Os ?8. Ruptured  descending thoracic aneurysm s/p repair: BP management.  ?            --will need repeat CTA 4  weeks (monitoring SCr  for improvement) ? 4.17- will need CT of Chest/abd/pelvis at 4 weeks ? 4/25- will see if they mean 4 weeks from initial surgery- wants kidney function to get better.  ?9. Acute on chronic renal failure: with new CKD Stage IV Improvement in BUN/SCr- 39/2.38 ?            --  monitor with serial checks. Avoid nephrotoxic medications.  ? 3/31- Cr and BUN stable con't to monitor weekly.  ? 4/3- Cr still 2.3-2.4- is stable- per Vascular wants pt kidney function to improve some, before CTA< however need to check for PE/so will do both ? 4/6- labs in AM ? 4/7- Cr 2.46- very slightly increased- will con't regimen ? Creatinine 2.49 on 4/11, stable ? 4/17- Cr up to 2.94- and BUN up to 60 from 40- will give IVFs NS 75cc/hour x 24 hours- pt's intake poor. Will recheck BMP in AM  ? 4/18- Cr down to 2.78- but BUN up slightly- to 68- will continue IVFs today for another 24 hours and recheck in AM ? 4/19- Cr down to 2.33 and BUN 63- slightly improved- stop IVFs and recheck  ? 4/21- Cr down to 2.23- and BUN down to 54- will recheck Monday and give IVF if needed- con't to push fluids ? 4/24 BUN/Cr up to 80/2.74---begin IV NS today 75cc/hr, EF 60% ? 4/25- BUN 74 and Cr 2.42- will give 1 more day of IVFs.  ?10. Acute blood loss anemia: Hgb reviewed and improving, monitor weekly.  ? Hemoglobin 9.3 on 4/11 ? 4/18- Hb up to 10 yesterday- will recheck in AM due to elevated BUN - making sure doesn't have GI bleed? Unlikely, but want to make sure not dropping ? 4/24 hgb 10.4.  may be partly due to dehydration ?11. Hypoxia: continue oxygen via nasal cannula as needed  ?            --Off diuresis. Resp rate increase with minimal movement- will monitor O2 sats.  ? 3/31- monitor closely, since  SOB with minimal exertion. ?4/3- still requiring O2- will check for PE this evening- since pt's still has DOE.  ?4/4- Couldn't do CTA- so ordered VQ scabn- pending.   ?4/5- has PE- but no DVT- has filter from 1995- d/w pulmonary- risk  of AC is worse than no AC- will hold off- due to high risk of bleeding profusely per vascular-  ?Asking palliative care to help with goals.  ? 4/6- pt wants to be full code right now palliative care ot see ag

## 2021-09-06 NOTE — Patient Care Conference (Signed)
Inpatient RehabilitationTeam Conference and Plan of Care Update ?Date: 09/06/2021   Time: 11:06 AM  ? ? ?Patient Name: Edward Hopkins      ?Medical Record Number: [id number]  ?Date of Birth: 08/25/1950 ?Sex: Male         ?Room/Bed: 8K99I/3J82N-05 ?Payor Info: Payor: [employer] / Plan: [facility] MEDICARE / Product Type: *No Product type* /   ? ?Admit Date/Time:  08/11/2021  5:23 PM ? ?Primary Diagnosis:  Intramural aortic hematoma (HCC) ? ?Hospital Problems: Principal Problem: ?  Intramural aortic hematoma ([place]) ?Active Problems: ?  Aortic aneurysm rupture ([place]) ?  Chronic kidney disease (CKD), stage IV (severe) (HCC) ?  Debility ?  Cerebral embolism with cerebral infarction ?  Goals of care, counseling/discussion ?  Palliative care by specialist ?  Supplemental oxygen dependent ?  Constipation ?  E. coli UTI ?  Suspected pulmonary embolism ? ? ? ?Expected Discharge Date: Expected Discharge Date: 09/07/21 ? ?Team Members Present: ?Physician leading conference: Dr. Courtney Heys ?Social Worker Present: [name], LCSWA ?Nurse Present: Dorthula Nettles, RN ?PT Present: Excell Seltzer, PT ?OT Present: Roanna Epley, Claris Gladden, OT ?PPS Coordinator present : Gunnar Fusi, SLP ? ?   Current Status/Progress Goal Weekly Team Focus  ?Bowel/Bladder ? ? continent bladder, incontinent bowel, lbm 4/22  regain continence  toilet q 2hr and prn   ?Swallow/Nutrition/ Hydration ? ?           ?ADL's ? ? min A UB dressing; mod A LB dressing; min A funcitonal tranfsers  min A/mod A  discharge planning   ?Mobility ? ? min A overall  min A overall  d/c to SNF, already d/c from PT services   ?Communication ? ?           ?Safety/Cognition/ Behavioral Observations ?           ?Pain ? ? right ankle pain  < 3  assess pain q 4hr and prn   ?Skin ? ? CDI  no new breakdown  assess skin q shift and prn   ? ? ?Discharge Planning:  ?Pt will d/c to SNF[facility] tomorrow; still here today as IVF needed   ?Team  Discussion: ?BUN 74, needs IVF. Hope to discharge tomorrow to SNF. Continent bladder, incontinent bowel, LBM 4/22. Right ankle pain, Tramadol PRN. Discharging to SNF. ? ?Patient on target to meet rehab goals: ?yes, all goals met. ? ?*See Care Plan and progress notes for long and short-term goals.  ? ?Revisions to Treatment Plan:  ?Adjusting medications, ordered IVF's. ?  ?Teaching Needs: ?Family education complete. ?  ?Current Barriers to Discharge: ?Labs  ? ?Possible Resolutions to Barriers: ?Receiving IVF's ?  ? ? Medical Summary ?Current Status: Bowels/bladder- "continent"- no skin issues- pain 5/10 R ankle- taking tramadol prn- Cr 2.42 down from 2.8 and BUN 74 down from 80- (baseline 40s) ? Barriers to Discharge: Decreased family/caregiver support;Home enviroment access/layout;Weight;Nutrition means ? Barriers to Discharge Comments: been d/c'd from PT and OT- min-mod A ?Possible Resolutions to [employer] Focus: SNF d/c tomorrow with 1 more day of IVFs ? ? ?Continued Need for Acute Rehabilitation Level of Care: The patient requires daily medical management by a physician with specialized training in physical medicine and rehabilitation for the following reasons: ?Direction of a multidisciplinary physical rehabilitation program to maximize functional independence : Yes ?Medical management of patient stability for increased activity during participation in an intensive rehabilitation regime.: Yes ?Analysis of laboratory values and/or radiology reports with any subsequent need  for medication adjustment and/or medical intervention. : Yes ? ? ?I attest that I was present, lead the team conference, and concur with the assessment and plan of the team. ? ? ?Dorthula Nettles G ?09/06/2021, 2:47 PM  ? ? ? ? ? ? ?

## 2021-09-07 LAB — BASIC METABOLIC PANEL
Anion gap: 8 (ref 5–15)
BUN: 62 mg/dL — ABNORMAL HIGH (ref 8–23)
CO2: 25 mmol/L (ref 22–32)
Calcium: 9.1 mg/dL (ref 8.9–10.3)
Chloride: 100 mmol/L (ref 98–111)
Creatinine, Ser: 2.18 mg/dL — ABNORMAL HIGH (ref 0.61–1.24)
GFR, Estimated: 31 mL/min — ABNORMAL LOW (ref >60)
Glucose, Bld: 103 mg/dL — ABNORMAL HIGH (ref 70–99)
Potassium: 4.4 mmol/L (ref 3.5–5.1)
Sodium: 133 mmol/L — ABNORMAL LOW (ref 135–145)

## 2021-09-07 NOTE — Progress Notes (Signed)
Report given to [employer] at [employer].  ? ?Dayna Ramus ? ?

## 2021-09-07 NOTE — Progress Notes (Signed)
Inpatient Rehabilitation Care Coordinator ?Discharge Note  ? ?Patient Details  ?Name: Edward Hopkins ?MRN: [id number] ?Date of Birth: 1951/01/24 ? ? ?Discharge location: D/c to SNF ? ?Length of Stay: 26 days ? ?Discharge activity level: ambulatory level Min Assist ? ?Home/community participation: Limited ? ?Patient response PO:LIDCVU Literacy - How often do you need to have someone help you when you read instructions, pamphlets, or other written material from your doctor or pharmacy?: Never ? ?Patient response DT:HYHOOI Isolation - How often do you feel lonely or isolated from those around you?: Never ? ?Services provided included: MD, RD, PT, OT, SLP, CM, TR, SW, Neuropsych, Pharmacy, RN ? ?Financial Services:  ?Charity fundraiser UtilizedSet designer (Medicaid) ?UHC Medicare ? ?Choices offered to/list presented to: yes ? ?Follow-up services arranged:  ? ? ?Patient response to transportation need: ?Is the patient able to respond to transportation needs?: No ?In the past 12 months, has lack of transportation kept you from medical appointments or from getting medications?: Yes ?In the past 12 months, has lack of transportation kept you from meetings, work, or from getting things needed for daily living?: Yes ? ?Comments (or additional information): ? ?Patient/Family verbalized understanding of follow-up arrangements:  Yes ? ?Individual responsible for coordination of the follow-up plan: contact pt ? ?Confirmed correct DME delivered: Rana Snare 09/07/2021   ? ?Rana Snare ?

## 2021-09-07 NOTE — Progress Notes (Signed)
Patient ID: Edward Hopkins, male   DOB: Sep 22, 1950, 71 y.o.   MRN: [id number] ? ?SW confirms pt will d/c today.  ? ?SW left message for [employer] informing pt cleared for d/c today. ? ?[name], MSW, LCSWA ?Office: [phone] ?Cell: [phone] ?Fax: [phone]  ?

## 2021-09-16 ENCOUNTER — Other Ambulatory Visit: Payer: Self-pay

## 2021-09-16 ENCOUNTER — Ambulatory Visit
Admit: 2021-09-16 | Discharge: 2021-09-16 | Disposition: A | Discharge status: Home / Self Care | Payer: Medicare Other | Attending: Vascular Surgery | Admitting: Vascular Surgery

## 2021-09-16 DIAGNOSIS — I7113 Aneurysm of the descending thoracic aorta, ruptured: Secondary | ICD-10-CM

## 2021-09-20 ENCOUNTER — Encounter: Payer: Medicare Other | Admitting: Vascular Surgery

## 2021-10-12 ENCOUNTER — Inpatient Hospital Stay: Payer: Medicare Other | Admitting: Registered Nurse

## 2022-05-01 ENCOUNTER — Ambulatory Visit: Payer: Self-pay | Admitting: Surgery

## 2022-06-01 ENCOUNTER — Other Ambulatory Visit: Payer: Self-pay

## 2022-06-01 ENCOUNTER — Inpatient Hospital Stay (HOSPITAL_COMMUNITY)
Admission: EM | Admit: 2022-06-01 | Discharge: 2022-06-07 | DRG: 871 | Disposition: A | Discharge status: Home Health | Payer: 59 | Attending: Internal Medicine | Admitting: Internal Medicine

## 2022-06-01 ENCOUNTER — Emergency Department (HOSPITAL_COMMUNITY): Payer: 59

## 2022-06-01 ENCOUNTER — Encounter (HOSPITAL_COMMUNITY): Payer: Self-pay | Admitting: Emergency Medicine

## 2022-06-01 DIAGNOSIS — R0602 Shortness of breath: Secondary | ICD-10-CM | POA: Diagnosis not present

## 2022-06-01 DIAGNOSIS — I130 Hypertensive heart and chronic kidney disease with heart failure and stage 1 through stage 4 chronic kidney disease, or unspecified chronic kidney disease: Secondary | ICD-10-CM | POA: Diagnosis not present

## 2022-06-01 DIAGNOSIS — N170 Acute kidney failure with tubular necrosis: Secondary | ICD-10-CM | POA: Diagnosis present

## 2022-06-01 DIAGNOSIS — J189 Pneumonia, unspecified organism: Secondary | ICD-10-CM

## 2022-06-01 DIAGNOSIS — I5032 Chronic diastolic (congestive) heart failure: Secondary | ICD-10-CM | POA: Diagnosis not present

## 2022-06-01 DIAGNOSIS — E861 Hypovolemia: Secondary | ICD-10-CM | POA: Diagnosis not present

## 2022-06-01 DIAGNOSIS — N189 Chronic kidney disease, unspecified: Secondary | ICD-10-CM | POA: Diagnosis not present

## 2022-06-01 DIAGNOSIS — E860 Dehydration: Secondary | ICD-10-CM | POA: Diagnosis present

## 2022-06-01 DIAGNOSIS — J1008 Influenza due to other identified influenza virus with other specified pneumonia: Secondary | ICD-10-CM | POA: Diagnosis present

## 2022-06-01 DIAGNOSIS — Z95828 Presence of other vascular implants and grafts: Secondary | ICD-10-CM

## 2022-06-01 DIAGNOSIS — I2489 Other forms of acute ischemic heart disease: Secondary | ICD-10-CM | POA: Diagnosis present

## 2022-06-01 DIAGNOSIS — N179 Acute kidney failure, unspecified: Secondary | ICD-10-CM | POA: Diagnosis not present

## 2022-06-01 DIAGNOSIS — A4189 Other specified sepsis: Secondary | ICD-10-CM | POA: Diagnosis not present

## 2022-06-01 DIAGNOSIS — J101 Influenza due to other identified influenza virus with other respiratory manifestations: Secondary | ICD-10-CM | POA: Diagnosis not present

## 2022-06-01 DIAGNOSIS — R7989 Other specified abnormal findings of blood chemistry: Secondary | ICD-10-CM | POA: Insufficient documentation

## 2022-06-01 DIAGNOSIS — Z79899 Other long term (current) drug therapy: Secondary | ICD-10-CM | POA: Diagnosis not present

## 2022-06-01 DIAGNOSIS — L89226 Pressure-induced deep tissue damage of left hip: Secondary | ICD-10-CM | POA: Diagnosis present

## 2022-06-01 DIAGNOSIS — N184 Chronic kidney disease, stage 4 (severe): Secondary | ICD-10-CM | POA: Diagnosis present

## 2022-06-01 DIAGNOSIS — M791 Myalgia, unspecified site: Secondary | ICD-10-CM | POA: Diagnosis not present

## 2022-06-01 DIAGNOSIS — Z7982 Long term (current) use of aspirin: Secondary | ICD-10-CM | POA: Diagnosis not present

## 2022-06-01 DIAGNOSIS — E43 Unspecified severe protein-calorie malnutrition: Secondary | ICD-10-CM | POA: Diagnosis present

## 2022-06-01 DIAGNOSIS — D696 Thrombocytopenia, unspecified: Secondary | ICD-10-CM | POA: Diagnosis present

## 2022-06-01 DIAGNOSIS — J159 Unspecified bacterial pneumonia: Secondary | ICD-10-CM | POA: Diagnosis present

## 2022-06-01 DIAGNOSIS — R079 Chest pain, unspecified: Secondary | ICD-10-CM | POA: Diagnosis not present

## 2022-06-01 DIAGNOSIS — D6959 Other secondary thrombocytopenia: Secondary | ICD-10-CM | POA: Diagnosis not present

## 2022-06-01 DIAGNOSIS — Z6822 Body mass index (BMI) 22.0-22.9, adult: Secondary | ICD-10-CM | POA: Diagnosis not present

## 2022-06-01 DIAGNOSIS — Z86718 Personal history of other venous thrombosis and embolism: Secondary | ICD-10-CM

## 2022-06-01 DIAGNOSIS — L8990 Pressure ulcer of unspecified site, unspecified stage: Secondary | ICD-10-CM | POA: Insufficient documentation

## 2022-06-01 DIAGNOSIS — F1290 Cannabis use, unspecified, uncomplicated: Secondary | ICD-10-CM | POA: Diagnosis present

## 2022-06-01 DIAGNOSIS — A419 Sepsis, unspecified organism: Secondary | ICD-10-CM

## 2022-06-01 DIAGNOSIS — R9389 Abnormal findings on diagnostic imaging of other specified body structures: Secondary | ICD-10-CM | POA: Diagnosis not present

## 2022-06-01 DIAGNOSIS — Z1152 Encounter for screening for COVID-19: Secondary | ICD-10-CM | POA: Diagnosis not present

## 2022-06-01 DIAGNOSIS — E872 Acidosis, unspecified: Secondary | ICD-10-CM

## 2022-06-01 DIAGNOSIS — I959 Hypotension, unspecified: Secondary | ICD-10-CM | POA: Diagnosis not present

## 2022-06-01 DIAGNOSIS — F1410 Cocaine abuse, uncomplicated: Secondary | ICD-10-CM | POA: Diagnosis present

## 2022-06-01 DIAGNOSIS — R9431 Abnormal electrocardiogram [ECG] [EKG]: Secondary | ICD-10-CM

## 2022-06-01 DIAGNOSIS — F17210 Nicotine dependence, cigarettes, uncomplicated: Secondary | ICD-10-CM | POA: Diagnosis not present

## 2022-06-01 LAB — CBC
HCT: 34.2 % — ABNORMAL LOW (ref 39.0–52.0)
Hemoglobin: 11.5 g/dL — ABNORMAL LOW (ref 13.0–17.0)
MCH: 29.1 pg (ref 26.0–34.0)
MCHC: 33.6 g/dL (ref 30.0–36.0)
MCV: 86.6 fL (ref 80.0–100.0)
Platelets: 100 10*3/uL — ABNORMAL LOW (ref 150–400)
RBC: 3.95 MIL/uL — ABNORMAL LOW (ref 4.22–5.81)
RDW: 13.3 % (ref 11.5–15.5)
WBC: 10.2 10*3/uL (ref 4.0–10.5)
nRBC: 0 % (ref 0.0–0.2)

## 2022-06-01 LAB — RESP PANEL BY RT-PCR (RSV, FLU A&B, COVID)  RVPGX2
Influenza A by PCR: POSITIVE — AB
Influenza B by PCR: NEGATIVE
Resp Syncytial Virus by PCR: NEGATIVE
SARS Coronavirus 2 by RT PCR: NEGATIVE

## 2022-06-01 NOTE — ED Triage Notes (Signed)
Pt bib ems c/o joint pain and shortness of breath for approx 3 days. Denies chest pain. Pt also states he had mechanical fall 2 days ago with laceration noted to top of bead. Denies loc during fall.   BO 130/80, HR 102, RR 18, Spo2 93% Ra

## 2022-06-01 NOTE — ED Provider Triage Note (Signed)
  Emergency Medicine Provider Triage Evaluation Note  MRN:  [id number]  Arrival date & time: 06/01/22    Medically screening exam initiated at 10:44 PM.   CC:   Shortness of Breath and Joint Pain   HPI:  Edward Hopkins is a 72 y.o. year-old male presents to the ED with chief complaint of SOB.  Onset 2 days ago.  Also states that all of his joints hurt.  Hx of aortic valve repair.  History provided by patient. ROS:  -As included in HPI PE:   Vitals:   06/01/22 2241  BP: (!) 115/48  Pulse: 92  Resp: (!) 28  Temp: 98.4 F (36.9 C)  SpO2: 96%    Non-toxic appearing No respiratory distress  MDM:  Based on signs and symptoms, COVID or flu is highest on my differential, followed by ACS. I've ordered labs and imaging in triage to expedite lab/diagnostic workup.  Patient was informed that the remainder of the evaluation will be completed by another provider, this initial triage assessment does not replace that evaluation, and the importance of remaining in the ED until their evaluation is complete.    Montine Circle, PA-C 06/01/22 2245

## 2022-06-02 ENCOUNTER — Observation Stay (HOSPITAL_COMMUNITY): Payer: 59

## 2022-06-02 DIAGNOSIS — E860 Dehydration: Secondary | ICD-10-CM | POA: Diagnosis present

## 2022-06-02 DIAGNOSIS — I2489 Other forms of acute ischemic heart disease: Secondary | ICD-10-CM | POA: Diagnosis present

## 2022-06-02 DIAGNOSIS — I130 Hypertensive heart and chronic kidney disease with heart failure and stage 1 through stage 4 chronic kidney disease, or unspecified chronic kidney disease: Secondary | ICD-10-CM | POA: Diagnosis not present

## 2022-06-02 DIAGNOSIS — J189 Pneumonia, unspecified organism: Secondary | ICD-10-CM | POA: Diagnosis not present

## 2022-06-02 DIAGNOSIS — Z1152 Encounter for screening for COVID-19: Secondary | ICD-10-CM | POA: Diagnosis not present

## 2022-06-02 DIAGNOSIS — J101 Influenza due to other identified influenza virus with other respiratory manifestations: Secondary | ICD-10-CM | POA: Diagnosis present

## 2022-06-02 DIAGNOSIS — R0602 Shortness of breath: Secondary | ICD-10-CM | POA: Diagnosis not present

## 2022-06-02 DIAGNOSIS — N189 Chronic kidney disease, unspecified: Secondary | ICD-10-CM | POA: Diagnosis not present

## 2022-06-02 DIAGNOSIS — F1410 Cocaine abuse, uncomplicated: Secondary | ICD-10-CM | POA: Diagnosis present

## 2022-06-02 DIAGNOSIS — F1290 Cannabis use, unspecified, uncomplicated: Secondary | ICD-10-CM | POA: Diagnosis present

## 2022-06-02 DIAGNOSIS — I5032 Chronic diastolic (congestive) heart failure: Secondary | ICD-10-CM | POA: Insufficient documentation

## 2022-06-02 DIAGNOSIS — D6959 Other secondary thrombocytopenia: Secondary | ICD-10-CM | POA: Diagnosis present

## 2022-06-02 DIAGNOSIS — A419 Sepsis, unspecified organism: Secondary | ICD-10-CM

## 2022-06-02 DIAGNOSIS — R7989 Other specified abnormal findings of blood chemistry: Secondary | ICD-10-CM | POA: Insufficient documentation

## 2022-06-02 DIAGNOSIS — I959 Hypotension, unspecified: Secondary | ICD-10-CM | POA: Diagnosis present

## 2022-06-02 DIAGNOSIS — R9431 Abnormal electrocardiogram [ECG] [EKG]: Secondary | ICD-10-CM

## 2022-06-02 DIAGNOSIS — N184 Chronic kidney disease, stage 4 (severe): Secondary | ICD-10-CM | POA: Diagnosis not present

## 2022-06-02 DIAGNOSIS — Z95828 Presence of other vascular implants and grafts: Secondary | ICD-10-CM | POA: Diagnosis not present

## 2022-06-02 DIAGNOSIS — N170 Acute kidney failure with tubular necrosis: Secondary | ICD-10-CM | POA: Diagnosis not present

## 2022-06-02 DIAGNOSIS — F17210 Nicotine dependence, cigarettes, uncomplicated: Secondary | ICD-10-CM | POA: Diagnosis present

## 2022-06-02 DIAGNOSIS — E861 Hypovolemia: Secondary | ICD-10-CM | POA: Diagnosis present

## 2022-06-02 DIAGNOSIS — Z86718 Personal history of other venous thrombosis and embolism: Secondary | ICD-10-CM | POA: Diagnosis not present

## 2022-06-02 DIAGNOSIS — J1008 Influenza due to other identified influenza virus with other specified pneumonia: Secondary | ICD-10-CM | POA: Diagnosis present

## 2022-06-02 DIAGNOSIS — N179 Acute kidney failure, unspecified: Secondary | ICD-10-CM | POA: Diagnosis not present

## 2022-06-02 DIAGNOSIS — R531 Weakness: Secondary | ICD-10-CM | POA: Diagnosis not present

## 2022-06-02 DIAGNOSIS — E43 Unspecified severe protein-calorie malnutrition: Secondary | ICD-10-CM | POA: Diagnosis present

## 2022-06-02 DIAGNOSIS — E872 Acidosis, unspecified: Secondary | ICD-10-CM | POA: Diagnosis not present

## 2022-06-02 DIAGNOSIS — Z79899 Other long term (current) drug therapy: Secondary | ICD-10-CM | POA: Diagnosis not present

## 2022-06-02 DIAGNOSIS — J159 Unspecified bacterial pneumonia: Secondary | ICD-10-CM | POA: Diagnosis present

## 2022-06-02 DIAGNOSIS — D696 Thrombocytopenia, unspecified: Secondary | ICD-10-CM | POA: Diagnosis not present

## 2022-06-02 DIAGNOSIS — A4189 Other specified sepsis: Secondary | ICD-10-CM | POA: Diagnosis present

## 2022-06-02 DIAGNOSIS — L89226 Pressure-induced deep tissue damage of left hip: Secondary | ICD-10-CM | POA: Diagnosis present

## 2022-06-02 DIAGNOSIS — D649 Anemia, unspecified: Secondary | ICD-10-CM | POA: Diagnosis not present

## 2022-06-02 DIAGNOSIS — Z6822 Body mass index (BMI) 22.0-22.9, adult: Secondary | ICD-10-CM | POA: Diagnosis not present

## 2022-06-02 DIAGNOSIS — Z7982 Long term (current) use of aspirin: Secondary | ICD-10-CM | POA: Diagnosis not present

## 2022-06-02 LAB — CBC
HCT: 36.5 % — ABNORMAL LOW (ref 39.0–52.0)
Hemoglobin: 11.6 g/dL — ABNORMAL LOW (ref 13.0–17.0)
MCH: 28.4 pg (ref 26.0–34.0)
MCHC: 31.8 g/dL (ref 30.0–36.0)
MCV: 89.5 fL (ref 80.0–100.0)
Platelets: 73 10*3/uL — ABNORMAL LOW (ref 150–400)
RBC: 4.08 MIL/uL — ABNORMAL LOW (ref 4.22–5.81)
RDW: 13.2 % (ref 11.5–15.5)
WBC: 9.1 10*3/uL (ref 4.0–10.5)
nRBC: 0 % (ref 0.0–0.2)

## 2022-06-02 LAB — BASIC METABOLIC PANEL
Anion gap: 11 (ref 5–15)
Anion gap: 12 (ref 5–15)
BUN: 67 mg/dL — ABNORMAL HIGH (ref 8–23)
BUN: 70 mg/dL — ABNORMAL HIGH (ref 8–23)
CO2: 19 mmol/L — ABNORMAL LOW (ref 22–32)
CO2: 18 mmol/L — ABNORMAL LOW (ref 22–32)
Calcium: 8.6 mg/dL — ABNORMAL LOW (ref 8.9–10.3)
Calcium: 8.4 mg/dL — ABNORMAL LOW (ref 8.9–10.3)
Chloride: 106 mmol/L (ref 98–111)
Chloride: 107 mmol/L (ref 98–111)
Creatinine, Ser: 3.34 mg/dL — ABNORMAL HIGH (ref 0.61–1.24)
Creatinine, Ser: 3.53 mg/dL — ABNORMAL HIGH (ref 0.61–1.24)
GFR, Estimated: 19 mL/min — ABNORMAL LOW (ref >60)
GFR, Estimated: 17 mL/min — ABNORMAL LOW (ref >60)
Glucose, Bld: 111 mg/dL — ABNORMAL HIGH (ref 70–99)
Glucose, Bld: 119 mg/dL — ABNORMAL HIGH (ref 70–99)
Potassium: 4.1 mmol/L (ref 3.5–5.1)
Potassium: 4.1 mmol/L (ref 3.5–5.1)
Sodium: 136 mmol/L (ref 135–145)
Sodium: 137 mmol/L (ref 135–145)

## 2022-06-02 LAB — BRAIN NATRIURETIC PEPTIDE: B Natriuretic Peptide: 131.5 pg/mL — ABNORMAL HIGH (ref 0.0–100.0)

## 2022-06-02 LAB — TROPONIN I (HIGH SENSITIVITY)
Troponin I (High Sensitivity): 38 ng/L — ABNORMAL HIGH (ref <18)
Troponin I (High Sensitivity): 32 ng/L — ABNORMAL HIGH (ref <18)

## 2022-06-02 LAB — PROCALCITONIN: Procalcitonin: 20.41 ng/mL

## 2022-06-02 LAB — MAGNESIUM: Magnesium: 2.2 mg/dL (ref 1.7–2.4)

## 2022-06-02 LAB — GLUCOSE, CAPILLARY: Glucose-Capillary: 102 mg/dL — ABNORMAL HIGH (ref 70–99)

## 2022-06-02 MED ORDER — OSELTAMIVIR PHOSPHATE 30 MG PO CAPS: 30.0000 mg | ORAL_CAPSULE | Freq: Every day | ORAL | Status: DC

## 2022-06-02 MED ORDER — ACETAMINOPHEN 325 MG PO TABS
650.0000 mg | ORAL_TABLET | Freq: Four times a day (QID) | ORAL | Status: DC | PRN
  Administered 2022-06-02 – 2022-06-07 (×5): 650 mg via ORAL
  Filled 2022-06-02 (×5): qty 2

## 2022-06-02 MED ORDER — HEPARIN SODIUM (PORCINE) 5000 UNIT/ML IJ SOLN: 5000.0000 [IU] | Freq: Three times a day (TID) | INTRAMUSCULAR | Status: DC

## 2022-06-02 MED ORDER — DOXYCYCLINE HYCLATE 100 MG PO TABS
100.0000 mg | ORAL_TABLET | Freq: Two times a day (BID) | ORAL | Status: AC
  Administered 2022-06-02 – 2022-06-06 (×10): 100 mg via ORAL
  Filled 2022-06-02 (×10): qty 1

## 2022-06-02 MED ORDER — SODIUM CHLORIDE 0.9 % IV SOLN
500.0000 mg | Freq: Once | INTRAVENOUS | Status: AC
  Administered 2022-06-02: 500 mg via INTRAVENOUS
  Filled 2022-06-02: qty 5

## 2022-06-02 MED ORDER — SODIUM CHLORIDE 0.9 % IV SOLN: INTRAVENOUS | Status: DC

## 2022-06-02 MED ORDER — SODIUM CHLORIDE 0.9 % IV SOLN
1.0000 g | Freq: Once | INTRAVENOUS | Status: AC
  Administered 2022-06-02: 1 g via INTRAVENOUS
  Filled 2022-06-02: qty 10

## 2022-06-02 MED ORDER — ORAL CARE MOUTH RINSE: 15.0000 mL | OROMUCOSAL | Status: DC | PRN

## 2022-06-02 MED ORDER — OSELTAMIVIR PHOSPHATE 30 MG PO CAPS
30.0000 mg | ORAL_CAPSULE | Freq: Every day | ORAL | Status: AC
  Administered 2022-06-02 – 2022-06-06 (×5): 30 mg via ORAL
  Filled 2022-06-02 (×5): qty 1

## 2022-06-02 MED ORDER — LACTATED RINGERS IV BOLUS
500.0000 mL | Freq: Once | INTRAVENOUS | Status: AC
  Administered 2022-06-02: 500 mL via INTRAVENOUS

## 2022-06-02 MED ORDER — SODIUM CHLORIDE 0.9 % IV SOLN
2.0000 g | INTRAVENOUS | Status: AC
  Administered 2022-06-03 – 2022-06-06 (×4): 2 g via INTRAVENOUS
  Filled 2022-06-02 (×4): qty 20

## 2022-06-02 MED ORDER — SODIUM CHLORIDE 0.9 % IV BOLUS
1000.0000 mL | Freq: Once | INTRAVENOUS | Status: AC
  Administered 2022-06-02: 1000 mL via INTRAVENOUS

## 2022-06-02 MED ORDER — ACETAMINOPHEN 650 MG RE SUPP: 650.0000 mg | Freq: Four times a day (QID) | RECTAL | Status: DC | PRN
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
## 2022-06-02 NOTE — Plan of Care (Signed)

## 2022-06-02 NOTE — ED Provider Notes (Signed)
[facility] Emergency Department Provider Note MRN:  [id number]  Arrival date & time: 06/02/22     Chief Complaint   Shortness of Breath and Joint Pain   History of Present Illness   Edward Hopkins is a 72 y.o. year-old male presents to the ED with chief complaint of SOB and generalized body aches and joint pains for the past 3 days.  Denies known fever.  Reports worsening symptoms with activity.  States that he feels dehydrated and doesn't have any energy.    Hx of ruptured aortic aneurysm rupture and repair.  History provided by patient.   Review of Systems  Pertinent positive and negative review of systems noted in HPI.    Physical Exam   Vitals:   06/01/22 2241 06/02/22 0215  BP: (!) 115/48 121/71  Pulse: 92   Resp: (!) 28 (!) 24  Temp: 98.4 F (36.9 C) 98.2 F (36.8 C)  SpO2: 96%     CONSTITUTIONAL:  chronically ill-appearing, NAD NEURO:  Alert and oriented x 3, CN 3-12 grossly intact EYES:  eyes equal and reactive ENT/NECK:  Supple, no stridor  CARDIO:  normal rate, regular rhythm, appears well-perfused, midline surgical incision scar  PULM:  No respiratory distress, diminished lung sounds GI/GU:  non-distended,  MSK/SPINE:  No gross deformities, no edema, moves all extremities  SKIN:  no rash, atraumatic   *Additional and/or pertinent findings included in MDM below  Diagnostic and Interventional Summary    EKG Interpretation  Date/Time:  Thursday June 01 2022 22:45:51 EST Ventricular Rate:  123 PR Interval:  150 QRS Duration: 142 QT Interval:  360 QTC Calculation: 515 R Axis:   -47 Text Interpretation: Sinus tachycardia with Premature atrial complexes Right bundle branch block Left anterior fascicular block * Bifascicular block * Left ventricular hypertrophy with repolarization abnormality ( R in aVL , Romhilt-Estes ) Cannot rule out Septal infarct , age undetermined Abnormal ECG When compared with ECG of 15-Aug-2021 14:57,  HEART RATE has increased Premature atrial complexes are now present Confirmed by Delora Fuel ([id number]) on 06/02/2022 2:30:42 AM       Labs Reviewed  RESP PANEL BY RT-PCR (RSV, FLU A&B, COVID)  RVPGX2 - Abnormal; Notable for the following components:      Result Value   Influenza A by PCR POSITIVE (*)    All other components within normal limits  BASIC METABOLIC PANEL - Abnormal; Notable for the following components:   CO2 19 (*)    Glucose, Bld 111 (*)    BUN 67 (*)    Creatinine, Ser 3.34 (*)    Calcium 8.6 (*)    GFR, Estimated 19 (*)    All other components within normal limits  CBC - Abnormal; Notable for the following components:   RBC 3.95 (*)    Hemoglobin 11.5 (*)    HCT 34.2 (*)    Platelets 100 (*)    All other components within normal limits  BRAIN NATRIURETIC PEPTIDE - Abnormal; Notable for the following components:   B Natriuretic Peptide 131.5 (*)    All other components within normal limits  TROPONIN I (HIGH SENSITIVITY) - Abnormal; Notable for the following components:   Troponin I (High Sensitivity) 38 (*)    All other components within normal limits  TROPONIN I (HIGH SENSITIVITY) - Abnormal; Notable for the following components:   Troponin I (High Sensitivity) 32 (*)    All other components within normal limits    DG Chest 2 View  Final Result      Medications  cefTRIAXone (ROCEPHIN) 1 g in sodium chloride 0.9 % 100 mL IVPB (1 g Intravenous New Bag/Given 06/02/22 0231)  azithromycin (ZITHROMAX) 500 mg in sodium chloride 0.9 % 250 mL IVPB (has no administration in time range)  lactated ringers bolus 500 mL (has no administration in time range)     Procedures  /  Critical Care .Critical Care  Performed by: Montine Circle, PA-C Authorized by: Montine Circle, PA-C   Critical care provider statement:    Critical care time (minutes):  37   Critical care was time spent personally by me on the following activities:  Development of treatment plan with  patient or surrogate, discussions with consultants, evaluation of patient's response to treatment, examination of patient, ordering and review of laboratory studies, ordering and review of radiographic studies, ordering and performing treatments and interventions, pulse oximetry, re-evaluation of patient's condition and review of old charts   ED Course and Medical Decision Making  I have reviewed the triage vital signs, the nursing notes, and pertinent available records from the EMR.  Social Determinants Affecting Complexity of Care: Patient has no clinically significant social determinants affecting this chief complaint..   ED Course:    Medical Decision Making Patient here with SOB for the past 3 days.  Reports generalized body aches and arthralgias as well.  States that he feels tired and dehydrated.   Flu A positive.  Pneumonia on CXR.  Will start IV antibiotics and small fluid bolus.  Will need admission.  Problems Addressed: Abnormal chest x-ray: complicated acute illness or injury Elevated troponin: acute illness or injury that poses a threat to life or bodily functions Influenza A: acute illness or injury with systemic symptoms  Amount and/or Complexity of Data Reviewed Labs: ordered.    Details: Trop 38->32, likely demand, doubt ACS BNP 131 WBC 10.2, HGB 11.5 Cr 3.34, increase from most recent of 2.18 last year, appears dry, will given 560ml fluid bolus Radiology: ordered and independent interpretation performed.    Details: Opacity to left lung concerning for pneumonia  Risk Decision regarding hospitalization.     Consultants: I discussed the case with Hospitalist, Dr. Marlowe Sax, who is appreciated for admitting.   Treatment and Plan: Patient's exam and diagnostic results are concerning for flu and pneumonia with demand ischemia.  Feel that patient will need admission to the hospital for further treatment and evaluation.    Final Clinical Impressions(s) / ED  Diagnoses     ICD-10-CM   1. Influenza A  J10.1     2. Elevated troponin  R79.89     3. Abnormal chest x-ray  R93.89       ED Discharge Orders     None         Discharge Instructions Discussed with and Provided to Patient:   Discharge Instructions   None      Montine Circle, PA-C 06/02/22 0236    Mesner, Corene Cornea, MD 06/03/22 [phone]
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
## 2022-06-02 NOTE — Progress Notes (Signed)
   06/02/22 1521  Assess: MEWS Score  Temp 97.9 F (36.6 C)  BP 95/61  MAP (mmHg) 72  Pulse Rate 76  ECG Heart Rate 79  Resp (!) 24  Level of Consciousness Alert  SpO2 100 %  O2 Device Room Air  Assess: MEWS Score  MEWS Temp 0  MEWS Systolic 1  MEWS Pulse 0  MEWS RR 1  MEWS LOC 0  MEWS Score 2  MEWS Score Color Yellow  Assess: if the MEWS score is Yellow or Red  Were vital signs taken at a resting state? Yes  Focused Assessment No change from prior assessment  Does the patient meet 2 or more of the SIRS criteria? No  MEWS guidelines implemented *See Row Information* Yes  Treat  MEWS Interventions Administered scheduled meds/treatments;Escalated (See documentation below)  Pain Scale 0-10  Pain Score 0  Take Vital Signs  Increase Vital Sign Frequency  Yellow: Q 2hr X 2 then Q 4hr X 2, if remains yellow, continue Q 4hrs  Escalate  MEWS: Escalate Yellow: discuss with charge nurse/RN and consider discussing with provider and RRT  Notify: Charge Nurse/RN  Date Charge Nurse/RN Notified 06/02/22  Time Charge Nurse/RN Notified 1525  Provider Notification  Provider Name/Title Dr. Antonieta Pert  Date Provider Notified 06/02/22  Time Provider Notified 1522  Method of Notification  (chat)  Notification Reason Other (Comment) (yellow mews. RR 20-40. BP 84/59. bolus going. now 95/61. pt says feels weak/sleepy. not dizzy.)  Provider response Other (Comment) (per MD complete bolus)  Date of Provider Response 06/02/22  Time of Provider Response 1522  Notify: Rapid Response  Name of Rapid Response RN Notified [facility] RN  Date Rapid Response Notified 06/02/22  Document  Patient Outcome Stabilized after interventions  Progress note created (see row info) Yes  Assess: SIRS CRITERIA  SIRS Temperature  0  SIRS Pulse 0  SIRS Respirations  1  SIRS WBC 0  SIRS Score Sum  1
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
## 2022-06-02 NOTE — Progress Notes (Signed)
Patient seen and examined personally, I reviewed the chart, history and physical and admission note, done by admitting physician this morning and agree with the same with following addendum.  Please refer to the morning admission note for more detailed plan of care.  Briefly,  72 y.o.m w/ hypertension, cocaine abuse, history of ruptured type B aortic aneurysm secondary to hypertensive emergency s/p operative repair in March 2023, hep C status posttreatment, bilateral inguinal hernias, chronic HFpEF, DVT status post IVC filter 07/2014, endocarditis, osteomyelitis, CKD stage IV presented with 3-day history of shortness of breath cough generalized weakness body ache joint pain.  Reports he felt worse after having argument with wife. In the ED found to have influenza A positive chest x-ray with airspace disease in the left midlung with small left pleural effusion, likely pneumonia.Labs:leukocytosis, hemoglobin 11.5 (stable), platelet count 100k, bicarb 19, anion gap 11, glucose 111, BUN 67, creatinine 3.3 (baseline 2.1-2.4), influenza A PCR positive, troponin 38> 32, BNP 131. He was given ceftriaxone, azithromycin, and 500 cc LR bolus and admitted for further management   On my evaluation he feels somewhat better. Surgery doing well on room air heart rate improved to 100 BP stable Labs his creatinine remains elevated 3.5 reports he is voiding some, his Pro-Cal was high 20.4, CBC showing worsening thrombocytopenia.  Issues Influenza A infection Pneumonia Sepsis POA due to pneumonia: No leukocytosis but with elevated procalcitonin.Resume ceftriaxone/doxycycline,check urine antigen, sputum culture.Blood culture not ordered but already received antibiotics-likely low yield.  Hemodynamically stable on room air continue Tamiflu.  Thrombocytopenia likely from patient's sepsis pneumonia influenza. AKI on CKD stage IV with metabolic acidosis: Baseline creatinine around 2.1-2.5 elevated 3.3 check renal ultrasound  monitor output, start IV fluids avoid nephrotoxic medication.  If still high tomorrow will benefit with nephro consult. Recent Labs    08/23/21 0523 08/29/21 0533 08/30/21 0548 08/31/21 0532 09/02/21 0511 09/05/21 2025 09/06/21 0508 09/07/21 0545 06/01/22 2302 06/02/22 0400  BUN 40* 64* 68* 63* 54* 80* 74* 62* 67* 70*  CREATININE 2.49* 2.94* 2.78* 2.33* 2.23* 2.74* 2.42* 2.18* 3.34* 3.53*    Demand ischemia with elevated troponin in the setting of sepsis and pneumonia troponin flat.  Monitor QT prolongation: Monitor electrolytes.  Avoid azithromycin/and other QT prolonging medication History of cocaine abuse: Follow-up UDS Hypertension BP stable Chronic CHF with preserved EF of EF 60 to 65% G2 DD BNP stable  Plan of care discussed with the patient and nursing staff

## 2022-06-02 NOTE — Hospital Course (Addendum)
Edward Hopkins is a 72 yo male with PMH HTN, cocaine use, hx ruptured type B descending thoracic dissection on 08/01/2021 s/p stent graft repair, HCV s/p treatment, bilateral inguinal hernias, HFpEF, hx DVT s/p IVC filter 07/2014, endocarditis, OM, CKDIV.  Patient presented with approximately 3-day history of shortness of breath, cough, weakness, body aches. On testing in the ER he was found to be influenza A positive on swab and CXR showed airspace disease involving left midlung and small left pleural effusion. He was started on Tamiflu and Rocephin/doxycycline. Other notable findings on workup on admission was that creatinine was increased above baseline, 3.34 on workup.  He endorsed a poor appetite prior to hospitalization with poor intake. He was started on fluids and nephrology was also consulted on admission.  Renal function slowly improved with fluids and trending.

## 2022-06-03 DIAGNOSIS — J189 Pneumonia, unspecified organism: Secondary | ICD-10-CM

## 2022-06-03 DIAGNOSIS — E872 Acidosis, unspecified: Secondary | ICD-10-CM

## 2022-06-03 DIAGNOSIS — N189 Chronic kidney disease, unspecified: Secondary | ICD-10-CM

## 2022-06-03 DIAGNOSIS — N179 Acute kidney failure, unspecified: Secondary | ICD-10-CM | POA: Diagnosis not present

## 2022-06-03 DIAGNOSIS — J101 Influenza due to other identified influenza virus with other respiratory manifestations: Secondary | ICD-10-CM | POA: Diagnosis not present

## 2022-06-03 DIAGNOSIS — A419 Sepsis, unspecified organism: Secondary | ICD-10-CM

## 2022-06-03 DIAGNOSIS — L8990 Pressure ulcer of unspecified site, unspecified stage: Secondary | ICD-10-CM | POA: Insufficient documentation

## 2022-06-03 LAB — RAPID URINE DRUG SCREEN, HOSP PERFORMED
Amphetamines: NOT DETECTED
Barbiturates: NOT DETECTED
Benzodiazepines: NOT DETECTED
Cocaine: POSITIVE — AB
Opiates: NOT DETECTED
Tetrahydrocannabinol: POSITIVE — AB

## 2022-06-03 LAB — BASIC METABOLIC PANEL
Anion gap: 8 (ref 5–15)
BUN: 75 mg/dL — ABNORMAL HIGH (ref 8–23)
CO2: 18 mmol/L — ABNORMAL LOW (ref 22–32)
Calcium: 8.1 mg/dL — ABNORMAL LOW (ref 8.9–10.3)
Chloride: 111 mmol/L (ref 98–111)
Creatinine, Ser: 3.55 mg/dL — ABNORMAL HIGH (ref 0.61–1.24)
GFR, Estimated: 17 mL/min — ABNORMAL LOW (ref >60)
Glucose, Bld: 89 mg/dL (ref 70–99)
Potassium: 4 mmol/L (ref 3.5–5.1)
Sodium: 137 mmol/L (ref 135–145)

## 2022-06-03 LAB — CBC
HCT: 27.9 % — ABNORMAL LOW (ref 39.0–52.0)
Hemoglobin: 9.6 g/dL — ABNORMAL LOW (ref 13.0–17.0)
MCH: 29.5 pg (ref 26.0–34.0)
MCHC: 34.4 g/dL (ref 30.0–36.0)
MCV: 85.8 fL (ref 80.0–100.0)
Platelets: 70 10*3/uL — ABNORMAL LOW (ref 150–400)
RBC: 3.25 MIL/uL — ABNORMAL LOW (ref 4.22–5.81)
RDW: 13.6 % (ref 11.5–15.5)
WBC: 5.4 10*3/uL (ref 4.0–10.5)
nRBC: 0 % (ref 0.0–0.2)

## 2022-06-03 LAB — PROCALCITONIN: Procalcitonin: 13.81 ng/mL

## 2022-06-03 LAB — CREATININE, URINE, RANDOM: Creatinine, Urine: 161 mg/dL

## 2022-06-03 LAB — SODIUM, URINE, RANDOM: Sodium, Ur: 29 mmol/L

## 2022-06-03 LAB — MAGNESIUM: Magnesium: 2.1 mg/dL (ref 1.7–2.4)

## 2022-06-03 LAB — STREP PNEUMONIAE URINARY ANTIGEN: Strep Pneumo Urinary Antigen: NEGATIVE

## 2022-06-03 MED ORDER — GUAIFENESIN-DM 100-10 MG/5ML PO SYRP: 5.0000 mL | ORAL_SOLUTION | Freq: Four times a day (QID) | ORAL | Status: DC | PRN

## 2022-06-03 MED ORDER — GUAIFENESIN ER 600 MG PO TB12
600.0000 mg | ORAL_TABLET | Freq: Two times a day (BID) | ORAL | Status: DC | PRN
  Administered 2022-06-03 (×2): 600 mg via ORAL
  Filled 2022-06-03 (×2): qty 1

## 2022-06-03 NOTE — Progress Notes (Signed)
Patient ID: Edward Hopkins, male   DOB: [birth year], 72 y.o.   MRN: [id number] S: Feeling better today but still with joint pains.  Eating and drinking better today. O:BP 103/70   Pulse 82   Temp 97.9 F (36.6 C) (Oral)   Resp 18   Ht 5\' 9"  (1.753 m)   Wt 70.1 kg   SpO2 99%   BMI 22.82 kg/m   Intake/Output Summary (Last 24 hours) at 06/03/2022 1108 Last data filed at 06/03/2022 0020 Gross per 24 hour  Intake 712.58 ml  Output 400 ml  Net 312.58 ml   Intake/Output: I/O last 3 completed shifts: In: 2034.2 [I.V.:1183.2; IV Piggyback:850.9] Out: 400 [Urine:400]  Intake/Output this shift:  No intake/output data recorded. Weight change: -7.011 kg Gen: NAD CVS: RRR Resp: CTA Abd: +BS, soft, NT/ND Ext: no edema  Recent Labs  Lab 06/01/22 2302 06/02/22 0400 06/03/22 0336  NA 136 137 137  K 4.1 4.1 4.0  CL 106 107 111  CO2 19* 18* 18*  GLUCOSE 111* 119* 89  BUN 67* 70* 75*  CREATININE 3.34* 3.53* 3.55*  CALCIUM 8.6* 8.4* 8.1*   Liver Function Tests: No results for input(s): "AST", "ALT", "ALKPHOS", "BILITOT", "PROT", "ALBUMIN" in the last 168 hours. No results for input(s): "LIPASE", "AMYLASE" in the last 168 hours. No results for input(s): "AMMONIA" in the last 168 hours. CBC: Recent Labs  Lab 06/01/22 2302 06/02/22 0400 06/03/22 0336  WBC 10.2 9.1 5.4  HGB 11.5* 11.6* 9.6*  HCT 34.2* 36.5* 27.9*  MCV 86.6 89.5 85.8  PLT 100* 73* 70*   Cardiac Enzymes: No results for input(s): "CKTOTAL", "CKMB", "CKMBINDEX", "TROPONINI" in the last 168 hours. CBG: Recent Labs  Lab 06/02/22 1532  GLUCAP 102*    Iron Studies: No results for input(s): "IRON", "TIBC", "TRANSFERRIN", "FERRITIN" in the last 72 hours. Studies/Results: US RENAL  Result Date: 06/02/2022 CLINICAL DATA:  [id number] Acute renal failure (ARF) ([place]) [id number] EXAM: RENAL / URINARY TRACT ULTRASOUND COMPLETE COMPARISON:  07/31/2021 FINDINGS: The right kidney measured 6.9 cm and the left kidney measured 11.6 cm.  Right kidney demonstrates abnormally echogenic parenchyma with renal cortical thinning consistent with scarring. Left kidney demonstrates normal parenchymal echogenicity and no evidence for renal cortical scarring. Right kidney lower pole simple appearing cyst measures 2.5 cm. No shadowing stones are seen. No hydronephrosis. Incidental note is made of a inferior right lobe hepatic cyst measuring 1.6 cm. Limited images of the urinary bladder are grossly unremarkable. IMPRESSION: 1. Asymmetrically atrophic and echogenic right kidney which may be due to underlying renal artery stenosis. This represents a change compared to the previous examination. 2. Right kidney cyst. 3. Cyst in the right lobe of the liver. Electronically Signed   By: Sammie Bench M.D.   On: 06/02/2022 11:08   DG Chest 2 View  Result Date: 06/01/2022 CLINICAL DATA:  Shortness of breath and joint pain. EXAM: CHEST - 2 VIEW COMPARISON:  08/15/2021 FINDINGS: Postoperative changes in the mediastinum with sternotomy wires and thoracic aortic stent graft. Surgical clips in the right upper chest. Heart size and pulmonary vascularity are normal. Airspace infiltration is demonstrated in the left mid lung, likely in the superior segment of the left lower lung. This is likely to represent pneumonia. Probable small left pleural effusion. Right lung is clear. IMPRESSION: Airspace disease in the left mid lung with small left pleural effusion, likely pneumonia. Right lung is clear. Electronically Signed   By: Lucienne Capers M.D.   On: 06/01/2022 23:26  doxycycline  100 mg Oral Q12H   oseltamivir  30 mg Oral Daily    BMET    Component Value Date/Time   NA 137 06/03/2022 0336   NA 141 10/21/2015 1516   K 4.0 06/03/2022 0336   K 4.1 10/21/2015 1516   CL 111 06/03/2022 0336   CO2 18 (L) 06/03/2022 0336   CO2 21 (L) 10/21/2015 1516   GLUCOSE 89 06/03/2022 0336   GLUCOSE 114 10/21/2015 1516   BUN 75 (H) 06/03/2022 0336   BUN 21.6 10/21/2015  1516   CREATININE 3.55 (H) 06/03/2022 0336   CREATININE 1.4 (H) 10/21/2015 1516   CALCIUM 8.1 (L) 06/03/2022 0336   CALCIUM 9.3 10/21/2015 1516   GFRNONAA 17 (L) 06/03/2022 0336   GFRAA >90 08/30/2014 0243   CBC    Component Value Date/Time   WBC 5.4 06/03/2022 0336   RBC 3.25 (L) 06/03/2022 0336   HGB 9.6 (L) 06/03/2022 0336   HGB 13.8 10/21/2015 1515   HCT 27.9 (L) 06/03/2022 0336   HCT 39.7 10/21/2015 1515   PLT 70 (L) 06/03/2022 0336   PLT 139 (L) 10/21/2015 1515   MCV 85.8 06/03/2022 0336   MCV 88.0 10/21/2015 1515   MCH 29.5 06/03/2022 0336   MCHC 34.4 06/03/2022 0336   RDW 13.6 06/03/2022 0336   RDW 13.3 10/21/2015 1515   LYMPHSABS 3.1 08/31/2021 0532   LYMPHSABS 2.4 10/21/2015 1515   MONOABS 0.8 08/31/2021 0532   MONOABS 0.4 10/21/2015 1515   EOSABS 0.5 08/31/2021 0532   EOSABS 0.4 10/21/2015 1515   BASOSABS 0.0 08/31/2021 0532   BASOSABS 0.0 10/21/2015 1515    Assessment/Plan:  AKI/CKD stage IV - presumably ischemic ATN in setting of hypotension, poor po intake, and influenza A pneumonia.  Agree with IVF's and continue to follow for UOP.   Renal US without hydronephrosis.  Awaiting urine sample.  UOP has increased overnight.  Scr appears to have reached a plateau at 3.55.  No indication for dialysis at this time and hopefully he will continue to respond to fluid resuscitation.  Avoid nephrotoxic medications including NSAIDs and iodinated intravenous contrast exposure unless the latter is absolutely indicated.   Preferred narcotic agents for pain control are hydromorphone, fentanyl, and methadone. Morphine should not be used.  Avoid Baclofen and avoid oral sodium phosphate and magnesium citrate based laxatives / bowel preps.  Continue strict Input and Output monitoring.  Will monitor the patient closely with you and intervene or adjust therapy as indicated by changes in clinical status/labs  Influenza A infection, pneumonia with sepsis - pt currently hypotensive and  on IVF's.  Tamiflu and abx Thrombocytopenia - follow HTN - blood pressure is low, hold home bp meds and follow Chronic diastolic CHF - appears hypovolemic on exam H/o cocaine use - states he has not had any since his hospitalization in March.  But does admit to smoking marijuana.  UDS pending.  Donetta Potts, MD [facility]

## 2022-06-03 NOTE — Progress Notes (Signed)
Progress Note    Edward Hopkins   FMB:[id number]  DOB: [birth year]  DOA: 06/01/2022     1 PCP: Cyndi Bender, PA-C  Initial CC: SOB, cough, weakness  Hospital Course: [name] is a 72 yo male with PMH HTN, cocaine use, hx ruptured type B descending thoracic dissection on 08/01/2021 s/p stent graft repair, HCV s/p treatment, bilateral inguinal hernias, HFpEF, hx DVT s/p IVC filter 07/2014, endocarditis, OM, CKDIV.  Patient presented with approximately 3-day history of shortness of breath, cough, weakness, body aches. On testing in the ER he was found to be influenza A positive on swab and CXR showed airspace disease involving left midlung and small left pleural effusion. He was started on Tamiflu and Rocephin/doxycycline. Other notable findings on workup on admission was that creatinine was increased above baseline, 3.34 on workup.  He endorsed a poor appetite prior to hospitalization with poor intake. He was started on fluids and nephrology was also consulted on admission.  Interval History:    Assessment and Plan:  Influenza A Pna due to Influenza and bacterial pna Sepsis due to flu and bacterial pna -Febrile, tachycardia, tachypnea -Influenza A positive.  CXR also concerning for left-sided pneumonia considered superimposed bacterial infection - Initial procalcitonin 20.41.  Continue trending - continue rocephin and doxy   AKI on CKD stage IV Non-AGMA AKI likely prerenal from dehydration in the setting of viral illness and very poor p.o. intake. At risk for ATN if prolonged pre-renal - baseline creat ~2.1 - 2.4 - initial BUN 67, creat 3.34 -Renal ultrasound showing asymmetric atrophic right kidney; no hydronephrosis - Continue fluids and trend renal function - follow up nephrology rec's   Thrombocytopenia Likely secondary to acute viral infection.  No signs of bleeding. -Continue to monitor   Mild troponin elevation Likely secondary to demand ischemia.  ACS less likely as  troponin flat (38 > 32) and patient is not endorsing chest pain. -Cardiac monitoring   QT prolongation QTc 515.   - monitoring electrolytes -Avoid QT prolonging drugs   History of cocaine abuse - follow up UDS   Hypertension Stable. -Continue to monitor blood pressure.   Chronic HFpEF Echo done in April 2023 showing EF 60 to 70%, grade 2 diastolic dysfunction. No significant elevation of BNP. -Monitor volume status closely   Old records reviewed in assessment of this patient  Antimicrobials: Doxycycline 06/02/2022 >> current Rocephin 06/02/2022 >> current Tamiflu 06/02/2022 >> current  DVT prophylaxis:  Place and maintain sequential compression device Start: 06/02/22 0332   Code Status:   Code Status: Full Code  Mobility Assessment (last 72 hours)     Mobility Assessment     Row Name 06/02/22 2100 06/02/22 1426         Does patient have an order for bedrest or is patient medically unstable No - Continue assessment No - Continue assessment      What is the highest level of mobility based on the progressive mobility assessment? Level 2 (Chairfast) - Balance while sitting on edge of bed and cannot stand Level 1 (Bedfast) - Unable to balance while sitting on edge of bed  did not get up at this time due to low bp               Barriers to discharge:  Disposition Plan: Home 3 to 4 days Status is: Inpatient  Objective: Blood pressure 98/70, pulse 69, temperature 98.3 F (36.8 C), temperature source Oral, resp. rate 20, height 5\' 9"  (1.753 m), weight  70.1 kg, SpO2 100 %.  Examination:  Physical Exam Constitutional:      General: He is not in acute distress.    Comments: Lethargic appearing  HENT:     Head: Normocephalic and atraumatic.     Mouth/Throat:     Mouth: Mucous membranes are moist.  Eyes:     Extraocular Movements: Extraocular movements intact.  Cardiovascular:     Rate and Rhythm: Normal rate and regular rhythm.     Heart sounds: Normal heart  sounds.  Pulmonary:     Effort: Pulmonary effort is normal. No respiratory distress.     Breath sounds: No wheezing.  Abdominal:     General: Bowel sounds are normal. There is no distension.     Palpations: Abdomen is soft.     Tenderness: There is no abdominal tenderness.  Musculoskeletal:        General: Normal range of motion.     Cervical back: Normal range of motion and neck supple.  Skin:    General: Skin is warm and dry.  Neurological:     General: No focal deficit present.     Mental Status: He is alert.  Psychiatric:        Mood and Affect: Mood normal.        Behavior: Behavior normal.      Consultants:  Nephrology  Procedures:    Data Reviewed: Results for orders placed or performed during the hospital encounter of 06/01/22 (from the past 24 hour(s))  Glucose, capillary     Status: Abnormal   Collection Time: 06/02/22  3:32 PM  Result Value Ref Range   Glucose-Capillary 102 (H) 70 - 99 mg/dL  Basic metabolic panel     Status: Abnormal   Collection Time: 06/03/22  3:36 AM  Result Value Ref Range   Sodium 137 135 - 145 mmol/L   Potassium 4.0 3.5 - 5.1 mmol/L   Chloride 111 98 - 111 mmol/L   CO2 18 (L) 22 - 32 mmol/L   Glucose, Bld 89 70 - 99 mg/dL   BUN 75 (H) 8 - 23 mg/dL   Creatinine, Ser 3.55 (H) 0.61 - 1.24 mg/dL   Calcium 8.1 (L) 8.9 - 10.3 mg/dL   GFR, Estimated 17 (L) >60 mL/min   Anion gap 8 5 - 15  CBC     Status: Abnormal   Collection Time: 06/03/22  3:36 AM  Result Value Ref Range   WBC 5.4 4.0 - 10.5 K/uL   RBC 3.25 (L) 4.22 - 5.81 MIL/uL   Hemoglobin 9.6 (L) 13.0 - 17.0 g/dL   HCT 27.9 (L) 39.0 - 52.0 %   MCV 85.8 80.0 - 100.0 fL   MCH 29.5 26.0 - 34.0 pg   MCHC 34.4 30.0 - 36.0 g/dL   RDW 13.6 11.5 - 15.5 %   Platelets 70 (L) 150 - 400 K/uL   nRBC 0.0 0.0 - 0.2 %  Magnesium     Status: None   Collection Time: 06/03/22  3:36 AM  Result Value Ref Range   Magnesium 2.1 1.7 - 2.4 mg/dL    I have reviewed pertinent nursing notes,  vitals, labs, and images as necessary. I have ordered labwork to follow up on as indicated.  I have reviewed the last notes from staff over past 24 hours. I have discussed patient's care plan and test results with nursing staff, CM/SW, and other staff as appropriate.  Time spent: Greater than 50% of the 55 minute visit was  spent in counseling/coordination of care for the patient as laid out in the A&P.   LOS: 1 day   Dwyane Dee, MD [facility] 06/03/2022, 1:20 PM

## 2022-06-03 NOTE — Consult Note (Signed)
WOC Nurse Consult Note: Reason for Consult:Partial thickness wound over left hip with purple/maroon discoloration surrounding consistent with deep tissue pressure injury Wound type:pressure v. trauma Pressure Injury POA: Yes Measurement:To be obtained by bedside RN today with application of next dressing change and documented on Nursing Flow Sheet Wound bed:as described above Drainage (amount, consistency, odor) none Periwound:with color change as described above Dressing procedure/placement/frequency:I have provided topical care guidance via the orders for a daily cleanse with NS followed by covering the affected area with an antimicrobial nonadherent, xeroform gauze. This is to be topped with a dry gauze and secured with a silicone foam dressing. Heels are to be floated and patient positioned from supine to right side, minimizing time in the left side lying position.  [place] nursing team will not follow, but will remain available to this patient, the nursing and medical teams.  Please re-consult if needed.  Thank you for inviting [place] to participate in this patient's Plan of Care.  Maudie Flakes, MSN, RN, CNS, [place], Serita Grammes, [employer], [employer] phone:  [phone]

## 2022-06-04 DIAGNOSIS — J101 Influenza due to other identified influenza virus with other respiratory manifestations: Secondary | ICD-10-CM | POA: Diagnosis not present

## 2022-06-04 DIAGNOSIS — N179 Acute kidney failure, unspecified: Secondary | ICD-10-CM | POA: Diagnosis not present

## 2022-06-04 DIAGNOSIS — N184 Chronic kidney disease, stage 4 (severe): Secondary | ICD-10-CM

## 2022-06-04 LAB — CBC WITH DIFFERENTIAL/PLATELET
Abs Immature Granulocytes: 0.03 10*3/uL (ref 0.00–0.07)
Basophils Absolute: 0 10*3/uL (ref 0.0–0.1)
Basophils Relative: 0 %
Eosinophils Absolute: 0.1 10*3/uL (ref 0.0–0.5)
Eosinophils Relative: 3 %
HCT: 29.3 % — ABNORMAL LOW (ref 39.0–52.0)
Hemoglobin: 9.7 g/dL — ABNORMAL LOW (ref 13.0–17.0)
Immature Granulocytes: 1 %
Lymphocytes Relative: 42 %
Lymphs Abs: 1.9 10*3/uL (ref 0.7–4.0)
MCH: 28.5 pg (ref 26.0–34.0)
MCHC: 33.1 g/dL (ref 30.0–36.0)
MCV: 86.2 fL (ref 80.0–100.0)
Monocytes Absolute: 0.3 10*3/uL (ref 0.1–1.0)
Monocytes Relative: 6 %
Neutro Abs: 2.2 10*3/uL (ref 1.7–7.7)
Neutrophils Relative %: 48 %
Platelets: 77 10*3/uL — ABNORMAL LOW (ref 150–400)
RBC: 3.4 MIL/uL — ABNORMAL LOW (ref 4.22–5.81)
RDW: 13.6 % (ref 11.5–15.5)
WBC: 4.5 10*3/uL (ref 4.0–10.5)
nRBC: 0 % (ref 0.0–0.2)

## 2022-06-04 LAB — RENAL FUNCTION PANEL
Albumin: 1.8 g/dL — ABNORMAL LOW (ref 3.5–5.0)
Anion gap: 11 (ref 5–15)
BUN: 68 mg/dL — ABNORMAL HIGH (ref 8–23)
CO2: 17 mmol/L — ABNORMAL LOW (ref 22–32)
Calcium: 8 mg/dL — ABNORMAL LOW (ref 8.9–10.3)
Chloride: 109 mmol/L (ref 98–111)
Creatinine, Ser: 3.23 mg/dL — ABNORMAL HIGH (ref 0.61–1.24)
GFR, Estimated: 19 mL/min — ABNORMAL LOW (ref >60)
Glucose, Bld: 85 mg/dL (ref 70–99)
Phosphorus: 2.7 mg/dL (ref 2.5–4.6)
Potassium: 4.4 mmol/L (ref 3.5–5.1)
Sodium: 137 mmol/L (ref 135–145)

## 2022-06-04 LAB — MAGNESIUM: Magnesium: 2.1 mg/dL (ref 1.7–2.4)

## 2022-06-04 LAB — PROCALCITONIN: Procalcitonin: 6.95 ng/mL

## 2022-06-04 NOTE — Progress Notes (Signed)
Patient ID: Edward Hopkins, male   DOB: 1951/04/15, 72 y.o.   MRN: [id number] S: Feeling better this morning. O:BP 115/68 (BP Location: Left Arm)   Pulse 75   Temp 98.7 F (37.1 C) (Axillary)   Resp 16   Ht 5\' 9"  (1.753 m)   Wt 70.1 kg   SpO2 99%   BMI 22.82 kg/m   Intake/Output Summary (Last 24 hours) at 06/04/2022 1025 Last data filed at 06/04/2022 0900 Gross per 24 hour  Intake 858 ml  Output 1215 ml  Net -357 ml   Intake/Output: I/O last 3 completed shifts: In: 740 [P.O.:740] Out: 1240 [Urine:1240]  Intake/Output this shift:  Total I/O In: 118 [P.O.:118] Out: 375 [Urine:375] Weight change:  Gen: NAD CVS: RRR Resp:CTA Abd: +BS, soft, Nt/Nd Ext: no edema  Recent Labs  Lab 06/01/22 2302 06/02/22 0400 06/03/22 0336 06/04/22 0350  NA 136 137 137 137  K 4.1 4.1 4.0 4.4  CL 106 107 111 109  CO2 19* 18* 18* 17*  GLUCOSE 111* 119* 89 85  BUN 67* 70* 75* 68*  CREATININE 3.34* 3.53* 3.55* 3.23*  ALBUMIN  --   --   --  1.8*  CALCIUM 8.6* 8.4* 8.1* 8.0*  PHOS  --   --   --  2.7   Liver Function Tests: Recent Labs  Lab 06/04/22 0350  ALBUMIN 1.8*   No results for input(s): "LIPASE", "AMYLASE" in the last 168 hours. No results for input(s): "AMMONIA" in the last 168 hours. CBC: Recent Labs  Lab 06/01/22 2302 06/02/22 0400 06/03/22 0336 06/04/22 0350  WBC 10.2 9.1 5.4 4.5  NEUTROABS  --   --   --  2.2  HGB 11.5* 11.6* 9.6* 9.7*  HCT 34.2* 36.5* 27.9* 29.3*  MCV 86.6 89.5 85.8 86.2  PLT 100* 73* 70* 77*   Cardiac Enzymes: No results for input(s): "CKTOTAL", "CKMB", "CKMBINDEX", "TROPONINI" in the last 168 hours. CBG: Recent Labs  Lab 06/02/22 1532  GLUCAP 102*    Iron Studies: No results for input(s): "IRON", "TIBC", "TRANSFERRIN", "FERRITIN" in the last 72 hours. Studies/Results: US RENAL  Result Date: 06/02/2022 CLINICAL DATA:  [id number] Acute renal failure (ARF) ([place]) [id number] EXAM: RENAL / URINARY TRACT ULTRASOUND COMPLETE COMPARISON:  07/31/2021  FINDINGS: The right kidney measured 6.9 cm and the left kidney measured 11.6 cm. Right kidney demonstrates abnormally echogenic parenchyma with renal cortical thinning consistent with scarring. Left kidney demonstrates normal parenchymal echogenicity and no evidence for renal cortical scarring. Right kidney lower pole simple appearing cyst measures 2.5 cm. No shadowing stones are seen. No hydronephrosis. Incidental note is made of a inferior right lobe hepatic cyst measuring 1.6 cm. Limited images of the urinary bladder are grossly unremarkable. IMPRESSION: 1. Asymmetrically atrophic and echogenic right kidney which may be due to underlying renal artery stenosis. This represents a change compared to the previous examination. 2. Right kidney cyst. 3. Cyst in the right lobe of the liver. Electronically Signed   By: Sammie Bench M.D.   On: 06/02/2022 11:08    doxycycline  100 mg Oral Q12H   oseltamivir  30 mg Oral Daily    BMET    Component Value Date/Time   NA 137 06/04/2022 0350   NA 141 10/21/2015 1516   K 4.4 06/04/2022 0350   K 4.1 10/21/2015 1516   CL 109 06/04/2022 0350   CO2 17 (L) 06/04/2022 0350   CO2 21 (L) 10/21/2015 1516   GLUCOSE 85 06/04/2022 0350  GLUCOSE 114 10/21/2015 1516   BUN 68 (H) 06/04/2022 0350   BUN 21.6 10/21/2015 1516   CREATININE 3.23 (H) 06/04/2022 0350   CREATININE 1.4 (H) 10/21/2015 1516   CALCIUM 8.0 (L) 06/04/2022 0350   CALCIUM 9.3 10/21/2015 1516   GFRNONAA 19 (L) 06/04/2022 0350   GFRAA >90 08/30/2014 0243   CBC    Component Value Date/Time   WBC 4.5 06/04/2022 0350   RBC 3.40 (L) 06/04/2022 0350   HGB 9.7 (L) 06/04/2022 0350   HGB 13.8 10/21/2015 1515   HCT 29.3 (L) 06/04/2022 0350   HCT 39.7 10/21/2015 1515   PLT 77 (L) 06/04/2022 0350   PLT 139 (L) 10/21/2015 1515   MCV 86.2 06/04/2022 0350   MCV 88.0 10/21/2015 1515   MCH 28.5 06/04/2022 0350   MCHC 33.1 06/04/2022 0350   RDW 13.6 06/04/2022 0350   RDW 13.3 10/21/2015 1515    LYMPHSABS 1.9 06/04/2022 0350   LYMPHSABS 2.4 10/21/2015 1515   MONOABS 0.3 06/04/2022 0350   MONOABS 0.4 10/21/2015 1515   EOSABS 0.1 06/04/2022 0350   EOSABS 0.4 10/21/2015 1515   BASOSABS 0.0 06/04/2022 0350   BASOSABS 0.0 10/21/2015 1515    Assessment/Plan:  AKI/CKD stage IV - presumably ischemic ATN in setting of hypotension, poor po intake, and influenza A pneumonia.  Agree with IVF's and continue to follow for UOP.   Renal US without hydronephrosis.  UOP and BUN/CR have finally started to improve overnight.  Continue with IVF's for now and follow.  No indication for dialysis at this time and hopefully he will continue to respond to fluid resuscitation.  Avoid nephrotoxic medications including NSAIDs and iodinated intravenous contrast exposure unless the latter is absolutely indicated.   Preferred narcotic agents for pain control are hydromorphone, fentanyl, and methadone. Morphine should not be used.  Avoid Baclofen and avoid oral sodium phosphate and magnesium citrate based laxatives / bowel preps.  Continue strict Input and Output monitoring.  Will monitor the patient closely with you and intervene or adjust therapy as indicated by changes in clinical status/labs  Influenza A infection, pneumonia with sepsis - pt currently hypotensive and on IVF's.  Tamiflu and abx Thrombocytopenia - follow HTN - blood pressure is low, hold home bp meds and follow Chronic diastolic CHF - appears hypovolemic on exam H/o cocaine use - states he has not had any since his hospitalization in March.  But does admit to smoking marijuana.  UDS + for cocaine and marijuana.  Donetta Potts, MD [facility]

## 2022-06-04 NOTE — Progress Notes (Signed)
Progress Note    Edward Hopkins   XAJ:[id number]  DOB: 17-Feb-1951  DOA: 06/01/2022     2 PCP: Cyndi Bender, PA-C  Initial CC: SOB, cough, weakness  Hospital Course: Edward Hopkins is a 72 yo male with PMH HTN, cocaine use, hx ruptured type B descending thoracic dissection on 08/01/2021 s/p stent graft repair, HCV s/p treatment, bilateral inguinal hernias, HFpEF, hx DVT s/p IVC filter 07/2014, endocarditis, OM, CKDIV.  Patient presented with approximately 3-day history of shortness of breath, cough, weakness, body aches. On testing in the ER he was found to be influenza A positive on swab and CXR showed airspace disease involving left midlung and small left pleural effusion. He was started on Tamiflu and Rocephin/doxycycline. Other notable findings on workup on admission was that creatinine was increased above baseline, 3.34 on workup.  He endorsed a poor appetite prior to hospitalization with poor intake. He was started on fluids and nephrology was also consulted on admission.  Interval History:  No events overnight.  Resting comfortably when seen this morning.  Still has some generalized weakness.  States he has been voiding very well since yesterday.  Urine also starting to clear and no longer tan-colored.  Assessment and Plan:  Influenza A Pna due to Influenza and bacterial pna Sepsis due to flu and bacterial pna -Febrile, tachycardia, tachypnea -Influenza A positive.  CXR also concerning for left-sided pneumonia considered superimposed bacterial infection - PCT downtrended (20.41 >> 13.81 >> 6.95) - continue rocephin and doxy to complete course    AKI on CKD stage IV Non-AGMA AKI likely prerenal from dehydration in the setting of viral illness and very poor p.o. intake. At risk for ATN if prolonged pre-renal - baseline creat ~2.1 - 2.4 - initial BUN 67, creat 3.34 -Renal ultrasound showing asymmetric atrophic right kidney; no hydronephrosis - Continue fluids and trend renal  function -Creatinine very slowly downtrending; diuresing very well, possibly entering diuretic phase of ATN - follow up nephrology rec's   Thrombocytopenia Likely secondary to acute viral infection.  No signs of bleeding. -Continue to monitor   Mild troponin elevation Likely secondary to demand ischemia.  ACS less likely as troponin flat (38 > 32) and patient is not endorsing chest pain. -Cardiac monitoring   QT prolongation QTc 515.   - monitoring electrolytes -Avoid QT prolonging drugs   History of cocaine abuse - UDS positive for cocaine and THC   Hypertension Stable -Continue to monitor blood pressure.   Chronic HFpEF Echo done in April 2023 showing EF 60 to 09%, grade 2 diastolic dysfunction. No significant elevation of BNP. -Monitor volume status closely   Old records reviewed in assessment of this patient  Antimicrobials: Doxycycline 06/02/2022 >> current Rocephin 06/02/2022 >> current Tamiflu 06/02/2022 >> current  DVT prophylaxis:  Place and maintain sequential compression device Start: 06/02/22 0332   Code Status:   Code Status: Full Code  Mobility Assessment (last 72 hours)     Mobility Assessment     Row Name 06/03/22 2125 06/02/22 2100 06/02/22 1426       Does patient have an order for bedrest or is patient medically unstable No - Continue assessment No - Continue assessment No - Continue assessment     What is the highest level of mobility based on the progressive mobility assessment? Level 2 (Chairfast) - Balance while sitting on edge of bed and cannot stand Level 2 (Chairfast) - Balance while sitting on edge of bed and cannot stand Level 1 (Bedfast) -  Unable to balance while sitting on edge of bed  did not get up at this time due to low bp              Barriers to discharge:  Disposition Plan: Home 3 to 4 days; follow-up PT eval Status is: Inpatient  Objective: Blood pressure 115/68, pulse 75, temperature 98.7 F (37.1 C), temperature  source Axillary, resp. rate 16, height 5\' 9"  (1.753 m), weight 70.1 kg, SpO2 99 %.  Examination:  Physical Exam Constitutional:      General: He is not in acute distress.    Comments: Lethargic appearing  HENT:     Head: Normocephalic and atraumatic.     Mouth/Throat:     Mouth: Mucous membranes are moist.  Eyes:     Extraocular Movements: Extraocular movements intact.  Cardiovascular:     Rate and Rhythm: Normal rate and regular rhythm.     Heart sounds: Normal heart sounds.  Pulmonary:     Effort: Pulmonary effort is normal. No respiratory distress.     Breath sounds: No wheezing.  Abdominal:     General: Bowel sounds are normal. There is no distension.     Palpations: Abdomen is soft.     Tenderness: There is no abdominal tenderness.  Genitourinary:    Comments: Yellow urine appreciated in urinal Musculoskeletal:        General: Normal range of motion.     Cervical back: Normal range of motion and neck supple.  Skin:    General: Skin is warm and dry.  Neurological:     General: No focal deficit present.     Mental Status: He is alert.  Psychiatric:        Mood and Affect: Mood normal.        Behavior: Behavior normal.      Consultants:  Nephrology  Procedures:    Data Reviewed: Results for orders placed or performed during the hospital encounter of 06/01/22 (from the past 24 hour(s))  Renal function panel     Status: Abnormal   Collection Time: 06/04/22  3:50 AM  Result Value Ref Range   Sodium 137 135 - 145 mmol/L   Potassium 4.4 3.5 - 5.1 mmol/L   Chloride 109 98 - 111 mmol/L   CO2 17 (L) 22 - 32 mmol/L   Glucose, Bld 85 70 - 99 mg/dL   BUN 68 (H) 8 - 23 mg/dL   Creatinine, Ser 3.23 (H) 0.61 - 1.24 mg/dL   Calcium 8.0 (L) 8.9 - 10.3 mg/dL   Phosphorus 2.7 2.5 - 4.6 mg/dL   Albumin 1.8 (L) 3.5 - 5.0 g/dL   GFR, Estimated 19 (L) >60 mL/min   Anion gap 11 5 - 15  Procalcitonin     Status: None   Collection Time: 06/04/22  3:50 AM  Result Value Ref  Range   Procalcitonin 6.95 ng/mL  CBC with Differential/Platelet     Status: Abnormal   Collection Time: 06/04/22  3:50 AM  Result Value Ref Range   WBC 4.5 4.0 - 10.5 K/uL   RBC 3.40 (L) 4.22 - 5.81 MIL/uL   Hemoglobin 9.7 (L) 13.0 - 17.0 g/dL   HCT 29.3 (L) 39.0 - 52.0 %   MCV 86.2 80.0 - 100.0 fL   MCH 28.5 26.0 - 34.0 pg   MCHC 33.1 30.0 - 36.0 g/dL   RDW 13.6 11.5 - 15.5 %   Platelets 77 (L) 150 - 400 K/uL   nRBC 0.0 0.0 -  0.2 %   Neutrophils Relative % 48 %   Neutro Abs 2.2 1.7 - 7.7 K/uL   Lymphocytes Relative 42 %   Lymphs Abs 1.9 0.7 - 4.0 K/uL   Monocytes Relative 6 %   Monocytes Absolute 0.3 0.1 - 1.0 K/uL   Eosinophils Relative 3 %   Eosinophils Absolute 0.1 0.0 - 0.5 K/uL   Basophils Relative 0 %   Basophils Absolute 0.0 0.0 - 0.1 K/uL   Immature Granulocytes 1 %   Abs Immature Granulocytes 0.03 0.00 - 0.07 K/uL  Magnesium     Status: None   Collection Time: 06/04/22  3:50 AM  Result Value Ref Range   Magnesium 2.1 1.7 - 2.4 mg/dL    I have reviewed pertinent nursing notes, vitals, labs, and images as necessary. I have ordered labwork to follow up on as indicated.  I have reviewed the last notes from staff over past 24 hours. I have discussed patient's care plan and test results with nursing staff, CM/SW, and other staff as appropriate.  Time spent: Greater than 50% of the 55 minute visit was spent in counseling/coordination of care for the patient as laid out in the A&P.   LOS: 2 days   Dwyane Dee, MD [facility] 06/04/2022, 11:37 AM

## 2022-06-05 ENCOUNTER — Ambulatory Visit: Payer: 59 | Admitting: Cardiology

## 2022-06-05 DIAGNOSIS — E43 Unspecified severe protein-calorie malnutrition: Secondary | ICD-10-CM | POA: Insufficient documentation

## 2022-06-05 DIAGNOSIS — J101 Influenza due to other identified influenza virus with other respiratory manifestations: Secondary | ICD-10-CM | POA: Diagnosis not present

## 2022-06-05 DIAGNOSIS — N184 Chronic kidney disease, stage 4 (severe): Secondary | ICD-10-CM | POA: Diagnosis not present

## 2022-06-05 DIAGNOSIS — N179 Acute kidney failure, unspecified: Secondary | ICD-10-CM | POA: Diagnosis not present

## 2022-06-05 LAB — RENAL FUNCTION PANEL
Albumin: 1.6 g/dL — ABNORMAL LOW (ref 3.5–5.0)
Anion gap: 7 (ref 5–15)
BUN: 56 mg/dL — ABNORMAL HIGH (ref 8–23)
CO2: 17 mmol/L — ABNORMAL LOW (ref 22–32)
Calcium: 7.7 mg/dL — ABNORMAL LOW (ref 8.9–10.3)
Chloride: 113 mmol/L — ABNORMAL HIGH (ref 98–111)
Creatinine, Ser: 2.48 mg/dL — ABNORMAL HIGH (ref 0.61–1.24)
GFR, Estimated: 27 mL/min — ABNORMAL LOW (ref >60)
Glucose, Bld: 93 mg/dL (ref 70–99)
Phosphorus: 2.4 mg/dL — ABNORMAL LOW (ref 2.5–4.6)
Potassium: 4.3 mmol/L (ref 3.5–5.1)
Sodium: 137 mmol/L (ref 135–145)

## 2022-06-05 LAB — CBC WITH DIFFERENTIAL/PLATELET
Abs Immature Granulocytes: 0.04 10*3/uL (ref 0.00–0.07)
Basophils Absolute: 0 10*3/uL (ref 0.0–0.1)
Basophils Relative: 0 %
Eosinophils Absolute: 0.1 10*3/uL (ref 0.0–0.5)
Eosinophils Relative: 3 %
HCT: 28.4 % — ABNORMAL LOW (ref 39.0–52.0)
Hemoglobin: 9.3 g/dL — ABNORMAL LOW (ref 13.0–17.0)
Immature Granulocytes: 1 %
Lymphocytes Relative: 45 %
Lymphs Abs: 2.2 10*3/uL (ref 0.7–4.0)
MCH: 28.8 pg (ref 26.0–34.0)
MCHC: 32.7 g/dL (ref 30.0–36.0)
MCV: 87.9 fL (ref 80.0–100.0)
Monocytes Absolute: 0.4 10*3/uL (ref 0.1–1.0)
Monocytes Relative: 7 %
Neutro Abs: 2.2 10*3/uL (ref 1.7–7.7)
Neutrophils Relative %: 44 %
Platelets: 97 10*3/uL — ABNORMAL LOW (ref 150–400)
RBC: 3.23 MIL/uL — ABNORMAL LOW (ref 4.22–5.81)
RDW: 13.9 % (ref 11.5–15.5)
WBC: 4.9 10*3/uL (ref 4.0–10.5)
nRBC: 0 % (ref 0.0–0.2)

## 2022-06-05 LAB — MAGNESIUM: Magnesium: 1.8 mg/dL (ref 1.7–2.4)

## 2022-06-05 LAB — PROCALCITONIN: Procalcitonin: 3.64 ng/mL

## 2022-06-05 MED ORDER — SODIUM BICARBONATE 650 MG PO TABS
650.0000 mg | ORAL_TABLET | Freq: Three times a day (TID) | ORAL | Status: DC
  Administered 2022-06-05 – 2022-06-07 (×7): 650 mg via ORAL
  Filled 2022-06-05 (×7): qty 1

## 2022-06-05 MED ORDER — ENSURE ENLIVE PO LIQD
237.0000 mL | Freq: Two times a day (BID) | ORAL | Status: DC
  Administered 2022-06-05 – 2022-06-07 (×6): 237 mL via ORAL

## 2022-06-05 MED ORDER — ADULT MULTIVITAMIN W/MINERALS CH
1.0000 | ORAL_TABLET | Freq: Every day | ORAL | Status: DC
  Administered 2022-06-05 – 2022-06-07 (×3): 1 via ORAL
  Filled 2022-06-05 (×3): qty 1

## 2022-06-05 NOTE — Progress Notes (Signed)
Initial Nutrition Assessment  DOCUMENTATION CODES:  Severe malnutrition in context of chronic illness  INTERVENTION:  Adjust diet to dysphagia 3 for ease of chewing  Ensure Enlive po BID, each supplement provides 350 kcal and 20 grams of protein. MVI with minerals daily  NUTRITION DIAGNOSIS:  Severe Malnutrition related to chronic illness (ruptured aortic aneurysm, HF, CKD, cocaine use) as evidenced by percent weight loss, severe fat depletion, moderate muscle depletion.  GOAL:  Patient will meet greater than or equal to 90% of their needs  MONITOR:  PO intake, Supplement acceptance, Labs, Weight trends  REASON FOR ASSESSMENT:  Malnutrition Screening Tool    ASSESSMENT:  Pt admitted with SOB and generalized body aches x3 days d/t influenza A. PMH significant for HTN, cocaine abuse, history of ruptured type B aortic aneurysm 2/2 HTN emergency, s/p operative repair (07/2021), hep C, chronic HFpEF, DVT, endocarditis, osteomyelitis, CKD stage IV.  Pt reports having an improving appetite. States that his appetite has been poor since getting sick about 2 weeks ago. He states that he may have gone a couple days without eating. Upon admission he states that he has only been able to take a couple spoonfuls of food and feels full.   At home prior to the last 2 weeks he states that he usually only grazes throughout the day on "junk."   Pt is noted to have poor dentition. He states that because of this he has difficulty chewing, without swallowing difficulties. We discussed different modified textured diets which may help with his chewing difficulties without being too restrictive/modified given his malnutrition. He is agreeable to a dysphagia 3 diet and believes that he could likely chew meats easier if they are already precut for him.   Meal completions: 1/20: 75% lunch, 75% dinner  He states that he had a stroke last March and lost a significant amount of weight down to 141 lbs. He was able  to increase his weight back up to about 150 lbs.  Reviewed weight history. Unfortunately, there is limited documentation of weight history on file within the last year. However, his documented weight on 08/11/21 was 92.3 kg. His current admit weight is 70.1 kg. This is a weight loss of 24.1% which is clinically significant for time frame.   Medications: doxycycline, MVI, sodium bicarbonate  NUTRITION - FOCUSED PHYSICAL EXAM: Flowsheet Row Most Recent Value  Orbital Region Moderate depletion  Upper Arm Region Severe depletion  Thoracic and Lumbar Region Mild depletion  Buccal Region Severe depletion  Temple Region Mild depletion  Clavicle Bone Region Moderate depletion  Clavicle and Acromion Bone Region Moderate depletion  Scapular Bone Region Moderate depletion  Dorsal Hand Moderate depletion  Patellar Region Moderate depletion  Anterior Thigh Region Moderate depletion  Posterior Calf Region Moderate depletion  Edema (RD Assessment) None  Hair Reviewed  Eyes Reviewed  Mouth Other (Comment)  [poor dentition]  Skin Reviewed  Nails Reviewed      Diet Order:   Diet Order             DIET DYS 3 Room service appropriate? Yes with Assist; Fluid consistency: Thin  Diet effective now                   EDUCATION NEEDS:  Education needs have been addressed  Skin:  Skin Assessment: Skin Integrity Issues: Skin Integrity Issues:: Stage II Stage II: L hip  Last BM:  1/20 (type 4)  Height:   Ht Readings from Last 1 Encounters:  06/02/22 5\' 9"  (1.753 m)    Weight:   Wt Readings from Last 1 Encounters:  06/02/22 70.1 kg   BMI:  Body mass index is 22.82 kg/m.  Estimated Nutritional Needs:   Kcal:  2000-2200  Protein:  100-115g  Fluid:  >/=2L  Clayborne Dana, RDN, LDN Clinical Nutrition

## 2022-06-05 NOTE — Evaluation (Signed)
Physical Therapy Evaluation Patient Details Name: Edward Hopkins MRN: [id number] DOB: 1951/02/17 Today's Date: 06/05/2022  History of Present Illness  Pt is a 72 y/o male who presents 06/02/2022 with PNA 2 flu, AKI and sepsis. PMH significant for CHF, DVT, endocarditis, HTN, intramural aortic hematoma, osteomyelitis, IVC filter, septic arthritis.   Clinical Impression  Pt admitted with above diagnosis. Pt currently with functional limitations due to the deficits listed below (see PT Problem List). At the time of PT eval pt was able to perform transfers and ambulation with gross min assist for balance support and safety. PTA pt renting a room in a 3 bedroom house and states the housemates would not be able to provide any assistance. Pt wanting to increase aide hours at d/c instead of going to short term rehab. Will progress mobility next session and if does not improve, may want to consider a higher level of care at d/c. Acutely, pt will benefit from skilled PT to increase their independence and safety with mobility to allow discharge to the venue listed below.          Recommendations for follow up therapy are one component of a multi-disciplinary discharge planning process, led by the attending physician.  Recommendations may be updated based on patient status, additional functional criteria and insurance authorization.  Follow Up Recommendations Home health PT (If pt can increase aide hours)      Assistance Recommended at Discharge Intermittent Supervision/Assistance  Patient can return home with the following  A little help with walking and/or transfers;A little help with bathing/dressing/bathroom;Assistance with cooking/housework;Help with stairs or ramp for entrance;Assist for transportation    Equipment Recommendations None recommended by PT  Recommendations for Other Services       Functional Status Assessment Patient has had a recent decline in their functional status and  demonstrates the ability to make significant improvements in function in a reasonable and predictable amount of time.     Precautions / Restrictions Precautions Precautions: Fall Restrictions Weight Bearing Restrictions: No      Mobility  Bed Mobility Overal bed mobility: Needs Assistance Bed Mobility: Supine to Sit     Supine to sit: Min assist     General bed mobility comments: Assist for trunk elevation to full sitting position. Increased time to get feet on floor.    Transfers Overall transfer level: Needs assistance Equipment used: Rolling walker (2 wheels) Transfers: Sit to/from Stand Sit to Stand: Min assist           General transfer comment: Assist to power up to full stand. Pt requires increased time to gain/maintain standing balance.    Ambulation/Gait Ambulation/Gait assistance: Min assist Gait Distance (Feet): 30 Feet Assistive device: Rolling walker (2 wheels) Gait Pattern/deviations: Decreased stride length, Leaning posteriorly, Narrow base of support, Shuffle Gait velocity: Decreased Gait velocity interpretation: <1.31 ft/sec, indicative of household ambulator   General Gait Details: Posterior LOB requiring assist to recover. Pt ambulated to the door and back to the window. Due to droplet precautions unable to go out in hall without a mask and none present in room. Pt required intermittent assist for walker management.  Stairs            Wheelchair Mobility    Modified Rankin (Stroke Patients Only)       Balance Overall balance assessment: Needs assistance Sitting-balance support: Feet supported, No upper extremity supported Sitting balance-Leahy Scale: Fair     Standing balance support: No upper extremity supported, During functional activity Standing  balance-Leahy Scale: Fair                               Pertinent Vitals/Pain Pain Assessment Pain Assessment: No/denies pain    Home Living Family/patient expects  to be discharged to:: Private residence Living Arrangements: Non-relatives/Friends Available Help at Discharge: Personal care attendant;Available PRN/intermittently Type of Home: House Home Access: Stairs to enter Entrance Stairs-Rails: Right Entrance Stairs-Number of Steps: 1   Home Layout: One level Home Equipment: Cane - single Barista (2 wheels);Shower seat;Adaptive equipment Additional Comments: Pt reports an aide comes in 4 hours/day 7 days/week to assist with ADL's    Prior Function Prior Level of Function : Needs assist;History of Falls (last six months)             Mobility Comments: Holds to walls. States he has a walker and a cane placed around the house but does not use them. ADLs Comments: Aide provides supervision to get into shower but pt bathes himself (standing, does not use shower chair). Pt dons undergarments independently and aide assists with the rest of dressing.     Hand Dominance   Dominant Hand: Right    Extremity/Trunk Assessment   Upper Extremity Assessment Upper Extremity Assessment: Defer to OT evaluation    Lower Extremity Assessment Lower Extremity Assessment: Generalized weakness    Cervical / Trunk Assessment Cervical / Trunk Assessment: Other exceptions Cervical / Trunk Exceptions: Forward head posture with rounded shoulders  Communication   Communication: Expressive difficulties (slurring)  Cognition Arousal/Alertness: Awake/alert Behavior During Therapy: WFL for tasks assessed/performed Overall Cognitive Status: Impaired/Different from baseline Area of Impairment: Safety/judgement, Problem solving                         Safety/Judgement: Decreased awareness of safety, Decreased awareness of deficits   Problem Solving: Slow processing, Requires verbal cues          General Comments      Exercises     Assessment/Plan    PT Assessment Patient needs continued PT services  PT Problem List Decreased  strength;Decreased range of motion;Decreased activity tolerance;Decreased balance;Decreased mobility;Decreased knowledge of use of DME;Decreased safety awareness;Decreased knowledge of precautions;Pain       PT Treatment Interventions DME instruction;Gait training;Stair training;Functional mobility training;Therapeutic activities;Therapeutic exercise;Balance training;Patient/family education    PT Goals (Current goals can be found in the Care Plan section)  Acute Rehab PT Goals Patient Stated Goal: Return home with increased aide hours PT Goal Formulation: With patient Time For Goal Achievement: 06/12/22 Potential to Achieve Goals: Good    Frequency Min 3X/week     Co-evaluation               AM-PAC PT "6 Clicks" Mobility  Outcome Measure Help needed turning from your back to your side while in a flat bed without using bedrails?: A Little Help needed moving from lying on your back to sitting on the side of a flat bed without using bedrails?: A Little Help needed moving to and from a bed to a chair (including a wheelchair)?: A Little Help needed standing up from a chair using your arms (e.g., wheelchair or bedside chair)?: A Little Help needed to walk in hospital room?: A Little Help needed climbing 3-5 steps with a railing? : A Little 6 Click Score: 18    End of Session Equipment Utilized During Treatment: Gait belt Activity Tolerance: Patient tolerated treatment  well Patient left: in chair;with call bell/phone within reach;with chair alarm set Nurse Communication: Mobility status PT Visit Diagnosis: Unsteadiness on feet (R26.81);Difficulty in walking, not elsewhere classified (R26.2)    Time: 3748-2707 PT Time Calculation (min) (ACUTE ONLY): 30 min   Charges:   PT Evaluation $PT Eval Moderate Complexity: 1 Mod PT Treatments $Gait Training: 8-22 mins        Edward Hopkins, PT, DPT Acute Rehabilitation Services Secure Chat Preferred Office: [phone]    Thelma Comp 06/05/2022, 4:04 PM

## 2022-06-05 NOTE — Care Management Important Message (Signed)
Important Message  Patient Details  Name: Edward Hopkins MRN: [id number] Date of Birth: [birth year]   Medicare Important Message Given:  Yes     Hannah Beat 06/05/2022, 3:20 PM

## 2022-06-05 NOTE — Progress Notes (Signed)
  Transition of Care [facility]) Screening Note   Patient Details  Name: Edward Hopkins Date of Birth: 1950/10/13   Transition of Care [facility]) CM/SW Contact:    Bartholomew Crews, RN Phone Number: 06/05/2022, 7:46 AM    Transition of Care Department [facility]) has reviewed patient and no TOC needs have been identified at this time. We will continue to monitor patient advancement through interdisciplinary progression rounds. If new patient transition needs arise, please place a TOC consult.

## 2022-06-05 NOTE — Progress Notes (Signed)
Progress Note    Edward Hopkins   QAS:[id number]  DOB: March 23, 1951  DOA: 06/01/2022     3 PCP: Cyndi Bender, PA-C  Initial CC: SOB, cough, weakness  Hospital Course: Mr. Orsak is a 73 yo male with PMH HTN, cocaine use, hx ruptured type B descending thoracic dissection on 08/01/2021 s/p stent graft repair, HCV s/p treatment, bilateral inguinal hernias, HFpEF, hx DVT s/p IVC filter 07/2014, endocarditis, OM, CKDIV.  Patient presented with approximately 3-day history of shortness of breath, cough, weakness, body aches. On testing in the ER he was found to be influenza A positive on swab and CXR showed airspace disease involving left midlung and small left pleural effusion. He was started on Tamiflu and Rocephin/doxycycline. Other notable findings on workup on admission was that creatinine was increased above baseline, 3.34 on workup.  He endorsed a poor appetite prior to hospitalization with poor intake. He was started on fluids and nephrology was also consulted on admission.  Interval History:  No events overnight.  Resting comfortably when seen this morning. Urine output continues. Renal recovery also continues. He has no concerns this morning. Still deconditioned appearing.   Assessment and Plan:  Influenza A Pna due to Influenza and bacterial pna Sepsis due to flu and bacterial pna -Febrile, tachycardia, tachypnea -Influenza A positive.  CXR also concerning for left-sided pneumonia considered superimposed bacterial infection - PCT downtrended (20.41 >> 13.81 >> 6.95 >> 3.64) - continue rocephin and doxy to complete course x 5 days   AKI on CKD stage IV Non-AGMA AKI likely prerenal from dehydration in the setting of viral illness and very poor p.o. intake. At risk for ATN if prolonged pre-renal - baseline creat ~2.1 - 2.4 - initial BUN 67, creat 3.34 -Renal ultrasound showing asymmetric atrophic right kidney; no hydronephrosis - Continue fluids and trend renal  function -Creatinine very slowly downtrending; diuresing very well, possibly entering diuretic phase of ATN -Outpatient follow-up with nephrology   Thrombocytopenia Likely secondary to acute viral infection.  No signs of bleeding. -Continue to monitor   Mild troponin elevation Likely secondary to demand ischemia.  ACS less likely as troponin flat (38 > 32) and patient is not endorsing chest pain. -Cardiac monitoring   QT prolongation QTc 515.   - monitoring electrolytes -Avoid QT prolonging drugs   History of cocaine abuse - UDS positive for cocaine and THC   Hypertension Stable -Continue to monitor blood pressure.   Chronic HFpEF Echo done in April 2023 showing EF 60 to 79%, grade 2 diastolic dysfunction. No significant elevation of BNP. -Monitor volume status closely   Old records reviewed in assessment of this patient  Antimicrobials: Doxycycline 06/02/2022 >> current Rocephin 06/02/2022 >> current Tamiflu 06/02/2022 >> current  DVT prophylaxis:  Place and maintain sequential compression device Start: 06/02/22 0332   Code Status:   Code Status: Full Code  Mobility Assessment (last 72 hours)     Mobility Assessment     Row Name 06/04/22 2207 06/03/22 2125 06/02/22 2100       Does patient have an order for bedrest or is patient medically unstable No - Continue assessment No - Continue assessment No - Continue assessment     What is the highest level of mobility based on the progressive mobility assessment? Level 2 (Chairfast) - Balance while sitting on edge of bed and cannot stand Level 2 (Chairfast) - Balance while sitting on edge of bed and cannot stand Level 2 (Chairfast) - Balance while sitting on edge of  bed and cannot stand     Is the above level different from baseline mobility prior to current illness? Yes - Recommend PT order -- --              Barriers to discharge:  Disposition Plan: Home 2-3 days; follow-up PT eval Status is:  Inpatient  Objective: Blood pressure 112/71, pulse 63, temperature 98 F (36.7 C), temperature source Oral, resp. rate (!) 23, height 5\' 9"  (1.753 m), weight 70.1 kg, SpO2 96 %.  Examination:  Physical Exam Constitutional:      General: He is not in acute distress.    Comments: Lethargic appearing  HENT:     Head: Normocephalic and atraumatic.     Mouth/Throat:     Mouth: Mucous membranes are moist.  Eyes:     Extraocular Movements: Extraocular movements intact.  Cardiovascular:     Rate and Rhythm: Normal rate and regular rhythm.     Heart sounds: Normal heart sounds.  Pulmonary:     Effort: Pulmonary effort is normal. No respiratory distress.     Breath sounds: No wheezing.  Abdominal:     General: Bowel sounds are normal. There is no distension.     Palpations: Abdomen is soft.     Tenderness: There is no abdominal tenderness.  Genitourinary:    Comments: Yellow urine appreciated in urinal Musculoskeletal:        General: Normal range of motion.     Cervical back: Normal range of motion and neck supple.  Skin:    General: Skin is warm and dry.  Neurological:     General: No focal deficit present.     Mental Status: He is alert.  Psychiatric:        Mood and Affect: Mood normal.        Behavior: Behavior normal.      Consultants:  Nephrology - signed off 1/22  Procedures:    Data Reviewed: Results for orders placed or performed during the hospital encounter of 06/01/22 (from the past 24 hour(s))  Renal function panel     Status: Abnormal   Collection Time: 06/05/22  3:09 AM  Result Value Ref Range   Sodium 137 135 - 145 mmol/L   Potassium 4.3 3.5 - 5.1 mmol/L   Chloride 113 (H) 98 - 111 mmol/L   CO2 17 (L) 22 - 32 mmol/L   Glucose, Bld 93 70 - 99 mg/dL   BUN 56 (H) 8 - 23 mg/dL   Creatinine, Ser 2.48 (H) 0.61 - 1.24 mg/dL   Calcium 7.7 (L) 8.9 - 10.3 mg/dL   Phosphorus 2.4 (L) 2.5 - 4.6 mg/dL   Albumin 1.6 (L) 3.5 - 5.0 g/dL   GFR, Estimated 27 (L) >60  mL/min   Anion gap 7 5 - 15  Procalcitonin     Status: None   Collection Time: 06/05/22  3:09 AM  Result Value Ref Range   Procalcitonin 3.64 ng/mL  CBC with Differential/Platelet     Status: Abnormal   Collection Time: 06/05/22  3:09 AM  Result Value Ref Range   WBC 4.9 4.0 - 10.5 K/uL   RBC 3.23 (L) 4.22 - 5.81 MIL/uL   Hemoglobin 9.3 (L) 13.0 - 17.0 g/dL   HCT 28.4 (L) 39.0 - 52.0 %   MCV 87.9 80.0 - 100.0 fL   MCH 28.8 26.0 - 34.0 pg   MCHC 32.7 30.0 - 36.0 g/dL   RDW 13.9 11.5 - 15.5 %   Platelets 97 (  L) 150 - 400 K/uL   nRBC 0.0 0.0 - 0.2 %   Neutrophils Relative % 44 %   Neutro Abs 2.2 1.7 - 7.7 K/uL   Lymphocytes Relative 45 %   Lymphs Abs 2.2 0.7 - 4.0 K/uL   Monocytes Relative 7 %   Monocytes Absolute 0.4 0.1 - 1.0 K/uL   Eosinophils Relative 3 %   Eosinophils Absolute 0.1 0.0 - 0.5 K/uL   Basophils Relative 0 %   Basophils Absolute 0.0 0.0 - 0.1 K/uL   Immature Granulocytes 1 %   Abs Immature Granulocytes 0.04 0.00 - 0.07 K/uL  Magnesium     Status: None   Collection Time: 06/05/22  3:09 AM  Result Value Ref Range   Magnesium 1.8 1.7 - 2.4 mg/dL    I have reviewed pertinent nursing notes, vitals, labs, and images as necessary. I have ordered labwork to follow up on as indicated.  I have reviewed the last notes from staff over past 24 hours. I have discussed patient's care plan and test results with nursing staff, CM/SW, and other staff as appropriate.  Time spent: Greater than 50% of the 55 minute visit was spent in counseling/coordination of care for the patient as laid out in the A&P.   LOS: 3 days   Dwyane Dee, MD [facility] 06/05/2022, 2:32 PM

## 2022-06-05 NOTE — Progress Notes (Signed)
Patient ID: Edward Hopkins, male   DOB: 1950-11-11, 72 y.o.   MRN: [id number] S: patient seen and examined bedside. No acute evetns overnight. Uop up to ~1.6L O:BP 112/71 (BP Location: Left Arm)   Pulse 63   Temp 98 F (36.7 C) (Oral)   Resp (!) 23   Ht 5\' 9"  (1.753 m)   Wt 70.1 kg   SpO2 96%   BMI 22.82 kg/m   Intake/Output Summary (Last 24 hours) at 06/05/2022 1222 Last data filed at 06/05/2022 1100 Gross per 24 hour  Intake 6943.41 ml  Output 1300 ml  Net 5643.41 ml   Intake/Output: I/O last 3 completed shifts: In: 7421.4 [P.O.:1198; I.V.:5923.4; IV Piggyback:300] Out: 2275 [Urine:2275]  Intake/Output this shift:  Total I/O In: -  Out: 300 [Urine:300] Weight change:  Gen: NAD CVS: RRR Resp: CTA Abd: +BS, soft, NT/ND Ext: no edema Neuro: awake, alert  Recent Labs  Lab 06/01/22 2302 06/02/22 0400 06/03/22 0336 06/04/22 0350 06/05/22 0309  NA 136 137 137 137 137  K 4.1 4.1 4.0 4.4 4.3  CL 106 107 111 109 113*  CO2 19* 18* 18* 17* 17*  GLUCOSE 111* 119* 89 85 93  BUN 67* 70* 75* 68* 56*  CREATININE 3.34* 3.53* 3.55* 3.23* 2.48*  ALBUMIN  --   --   --  1.8* 1.6*  CALCIUM 8.6* 8.4* 8.1* 8.0* 7.7*  PHOS  --   --   --  2.7 2.4*   Liver Function Tests: Recent Labs  Lab 06/04/22 0350 06/05/22 0309  ALBUMIN 1.8* 1.6*   No results for input(s): "LIPASE", "AMYLASE" in the last 168 hours. No results for input(s): "AMMONIA" in the last 168 hours. CBC: Recent Labs  Lab 06/01/22 2302 06/02/22 0400 06/03/22 0336 06/04/22 0350 06/05/22 0309  WBC 10.2 9.1 5.4 4.5 4.9  NEUTROABS  --   --   --  2.2 2.2  HGB 11.5* 11.6* 9.6* 9.7* 9.3*  HCT 34.2* 36.5* 27.9* 29.3* 28.4*  MCV 86.6 89.5 85.8 86.2 87.9  PLT 100* 73* 70* 77* 97*   Cardiac Enzymes: No results for input(s): "CKTOTAL", "CKMB", "CKMBINDEX", "TROPONINI" in the last 168 hours. CBG: Recent Labs  Lab 06/02/22 1532  GLUCAP 102*    Iron Studies: No results for input(s): "IRON", "TIBC", "TRANSFERRIN",  "FERRITIN" in the last 72 hours. Studies/Results: No results found.  doxycycline  100 mg Oral Q12H   feeding supplement  237 mL Oral BID BM   multivitamin with minerals  1 tablet Oral Daily   oseltamivir  30 mg Oral Daily    BMET    Component Value Date/Time   NA 137 06/05/2022 0309   NA 141 10/21/2015 1516   K 4.3 06/05/2022 0309   K 4.1 10/21/2015 1516   CL 113 (H) 06/05/2022 0309   CO2 17 (L) 06/05/2022 0309   CO2 21 (L) 10/21/2015 1516   GLUCOSE 93 06/05/2022 0309   GLUCOSE 114 10/21/2015 1516   BUN 56 (H) 06/05/2022 0309   BUN 21.6 10/21/2015 1516   CREATININE 2.48 (H) 06/05/2022 0309   CREATININE 1.4 (H) 10/21/2015 1516   CALCIUM 7.7 (L) 06/05/2022 0309   CALCIUM 9.3 10/21/2015 1516   GFRNONAA 27 (L) 06/05/2022 0309   GFRAA >90 08/30/2014 0243   CBC    Component Value Date/Time   WBC 4.9 06/05/2022 0309   RBC 3.23 (L) 06/05/2022 0309   HGB 9.3 (L) 06/05/2022 0309   HGB 13.8 10/21/2015 1515   HCT 28.4 (L)  06/05/2022 0309   HCT 39.7 10/21/2015 1515   PLT 97 (L) 06/05/2022 0309   PLT 139 (L) 10/21/2015 1515   MCV 87.9 06/05/2022 0309   MCV 88.0 10/21/2015 1515   MCH 28.8 06/05/2022 0309   MCHC 32.7 06/05/2022 0309   RDW 13.9 06/05/2022 0309   RDW 13.3 10/21/2015 1515   LYMPHSABS 2.2 06/05/2022 0309   LYMPHSABS 2.4 10/21/2015 1515   MONOABS 0.4 06/05/2022 0309   MONOABS 0.4 10/21/2015 1515   EOSABS 0.1 06/05/2022 0309   EOSABS 0.4 10/21/2015 1515   BASOSABS 0.0 06/05/2022 0309   BASOSABS 0.0 10/21/2015 1515    Assessment/Plan:  AKI/CKD stage IV - presumably ischemic ATN in setting of hypotension, poor po intake, cocaine use, and influenza A pneumonia.  Agree with IVF's and continue to follow for UOP.   Renal US without hydronephrosis.  Awaiting urine sample.  UOP has increased overnight.  Scr improved to 2.48--close to baseline (which seems to range from low to mid 2's).  No indication for dialysis at this time. Can d/c IVF as his appetite/intake  improves Avoid nephrotoxic medications including NSAIDs and iodinated intravenous contrast exposure unless the latter is absolutely indicated.  Preferred narcotic agents for pain control are hydromorphone, fentanyl, and methadone. Morphine should not be used. Avoid Baclofen and avoid oral sodium phosphate and magnesium citrate based laxatives / bowel preps. Continue strict Input and Output monitoring. Will monitor the patient closely with you and intervene or adjust therapy as indicated by changes in clinical status/labs  Influenza A infection, pneumonia with sepsis - pt currently hypotensive and on IVF's.  Tamiflu and abx Thrombocytopenia - follow HTN - blood pressure is low, hold home bp meds and follow Chronic diastolic CHF - appears hypovolemic on exam Metabolic acidosis - will start nahco3 tabs H/o cocaine use - states he has not had any since his hospitalization in March.  But does admit to smoking marijuana.  UDS positive for THC and cocaine  Nothing else to add at this time from a nephrology perspective. Will sign off. Will arrange for follow up at the office. Please call with any questions/concerns.  Gean Quint, MD [facility]

## 2022-06-05 NOTE — TOC Initial Note (Signed)
Transition of Care [facility]) - Initial/Assessment Note    Patient Details  Name: Edward Hopkins MRN: [id number] Date of Birth: [birth year]  Transition of Care [facility] CM/SW Contact:    Bartholomew Crews, RN Phone Number: [phone] 06/05/2022, 3:38 PM  Clinical Narrative:                  Spoke with patient at the bedside during PT evaluation. PTA lives at [address] [place], [place] [address] [facility]) where he rents a room from [place]. He stated that he received about 3 weeks of rehab in may of last year 2023. He has a RW at home, but would also like to have safety bars in the shower.   He has an aide, Roni Bread, who works for [employer] and assists him about 4 hrs/day 7 days/week. Patient would like to receive additional aide hours if able. Left message at [place] [phone] to discuss possible extension of hours at least short term while patient is recovering from the flu.  For transportation, patient stated that he paid for a car to transport him, because last time he tried to schedule a ride it took 2 1/2 hours. He stated that Otila Kluver will likely provide transportation home at discharge.   TOC following for transition needs.   Expected Discharge Plan: [place] Barriers to Discharge: Continued Medical Work up   Patient Goals and CMS Choice Patient states their goals for this hospitalization and ongoing recovery are:: return home CMS Medicare.gov Compare Post Acute Care list provided to:: Patient Choice offered to / list presented to : Patient      Expected Discharge Plan and Services   Discharge Planning Services: CM Consult Post Acute Care Choice: [place] arrangements for the past 2 months: [place] (rents a room in [name]'s home)                   DME Agency: NA                  Prior Living Arrangements/Services Living arrangements for the past 2 months: [place] (rents a room in [place] home) Lives with::  Self, Other (Comment) (landlord) Patient language and need for interpreter reviewed:: Yes Do you feel safe going back to the place where you live?: Yes      Need for Family Participation in Patient Care: Yes (Comment) Care giver support system in place?: Yes (comment) Current home services: DME, Homehealth aide (RW; aide from [place] 4 hrs/day 7 days/week) Criminal Activity/Legal Involvement Pertinent to Current Situation/Hospitalization: No - Comment as needed  Activities of Daily Living Home Assistive Devices/Equipment: Eyeglasses, Environmental consultant (specify type) ADL Screening (condition at time of admission) Patient's cognitive ability adequate to safely complete daily activities?: Yes Is the patient deaf or have difficulty hearing?: No Does the patient have difficulty seeing, even when wearing glasses/contacts?: No Does the patient have difficulty concentrating, remembering, or making decisions?: No Patient able to express need for assistance with ADLs?: Yes Does the patient have difficulty dressing or bathing?: Yes Independently performs ADLs?: No Communication: Independent Dressing (OT): Needs assistance Is this a change from baseline?: Pre-admission baseline Grooming: Needs assistance Is this a change from baseline?: Pre-admission baseline Feeding: Independent Bathing: Needs assistance Is this a change from baseline?: Pre-admission baseline Toileting: Needs assistance Is this a change from baseline?: Pre-admission baseline In/Out Bed: Needs assistance Is this a change from baseline?: Pre-admission baseline Walks in Home:  Needs assistance Is this a change from baseline?: Pre-admission baseline Does the patient have difficulty walking or climbing stairs?: Yes Weakness of Legs: Both Weakness of Arms/Hands: Both  Permission Sought/Granted                  Emotional Assessment Appearance:: Appears stated age Attitude/Demeanor/Rapport: Engaged Affect (typically observed):  Accepting Orientation: : Oriented to Self, Oriented to Place, Oriented to  Time, Oriented to Situation Alcohol / Substance Use: Not Applicable Psych Involvement: No (comment)  Admission diagnosis:  Influenza A [J10.1] Elevated troponin [R79.89] Abnormal chest x-ray [R93.89] Patient Active Problem List   Diagnosis Date Noted   Pressure injury of skin 06/03/2022   Influenza A 06/02/2022   CAP (community acquired pneumonia) 06/02/2022   Sepsis ([place]) 45/62/5638   Metabolic acidosis 93/73/4287   Elevated troponin 06/02/2022   QT prolongation 06/02/2022   Chronic heart failure with preserved ejection fraction ([place]) 06/02/2022   Constipation 08/31/2021   E. coli UTI 08/31/2021   Suspected pulmonary embolism 08/31/2021   Supplemental oxygen dependent    Goals of care, counseling/discussion    Palliative care by specialist    Cerebral embolism with cerebral infarction 08/16/2021   Aortic aneurysm rupture ([place]) 08/11/2021   Hypertensive emergency 08/11/2021   Acute respiratory failure with hypoxia ([place]) 08/11/2021   Hemorrhagic shock ([place]) 08/11/2021   Thrombocytopenia ([place]) 08/11/2021   Chronic kidney disease (CKD), stage IV (severe) ([place]) 08/11/2021   Debility 08/11/2021   Cocaine abuse ([place])    Shock ([place])    Dissection of aorta ([place]) 07/27/2021   Hypercoagulable state ([place]) 10/21/2015   Pulmonary embolism ([place]) 10/21/2015   S/P ascending aortic replacement 08/27/2014   Acute renal failure superimposed on stage 4 chronic kidney disease ([place]) 08/06/2014   Malnutrition of moderate degree ([place]) 08/05/2014   Orthostatic hypotension 08/04/2014   Hypotension 08/04/2014   Femoral DVT (deep venous thrombosis) ([place])    Intramural aortic hematoma ([place]) 07/17/2014   PCP:  Cyndi Bender, PA-C Pharmacy:   [id number] - [place], [place] [address] Phone: [phone] Fax: [place]  [place], [place] - [address] [place] [address] Phone: [phone] Fax: [phone]     Social Determinants of Health (SDOH) Social History: SDOH Screenings   Food Insecurity: No Food Insecurity (06/02/2022)  Housing: Low Risk  (06/02/2022)  Transportation Needs: No Transportation Needs (06/02/2022)  Utilities: Not At Risk (06/02/2022)  Tobacco Use: High Risk (06/01/2022)   SDOH Interventions:     Readmission Risk Interventions    08/11/2021    1:23 PM  Readmission Risk Prevention Plan  Transportation Screening Complete  PCP or Specialist Appt within 3-5 Days Complete  HRI or [place] Complete  Social Work Consult for [place] Planning/Counseling Complete  Palliative Care Screening Not Applicable  Medication Review Press photographer) Complete

## 2022-06-06 DIAGNOSIS — N179 Acute kidney failure, unspecified: Secondary | ICD-10-CM | POA: Diagnosis not present

## 2022-06-06 DIAGNOSIS — J101 Influenza due to other identified influenza virus with other respiratory manifestations: Secondary | ICD-10-CM | POA: Diagnosis not present

## 2022-06-06 DIAGNOSIS — N184 Chronic kidney disease, stage 4 (severe): Secondary | ICD-10-CM | POA: Diagnosis not present

## 2022-06-06 LAB — CBC WITH DIFFERENTIAL/PLATELET
Abs Immature Granulocytes: 0.04 10*3/uL (ref 0.00–0.07)
Basophils Absolute: 0 10*3/uL (ref 0.0–0.1)
Basophils Relative: 0 %
Eosinophils Absolute: 0.2 10*3/uL (ref 0.0–0.5)
Eosinophils Relative: 4 %
HCT: 26.9 % — ABNORMAL LOW (ref 39.0–52.0)
Hemoglobin: 8.9 g/dL — ABNORMAL LOW (ref 13.0–17.0)
Immature Granulocytes: 1 %
Lymphocytes Relative: 49 %
Lymphs Abs: 2.2 10*3/uL (ref 0.7–4.0)
MCH: 28.9 pg (ref 26.0–34.0)
MCHC: 33.1 g/dL (ref 30.0–36.0)
MCV: 87.3 fL (ref 80.0–100.0)
Monocytes Absolute: 0.3 10*3/uL (ref 0.1–1.0)
Monocytes Relative: 7 %
Neutro Abs: 1.7 10*3/uL (ref 1.7–7.7)
Neutrophils Relative %: 39 %
Platelets: 130 10*3/uL — ABNORMAL LOW (ref 150–400)
RBC: 3.08 MIL/uL — ABNORMAL LOW (ref 4.22–5.81)
RDW: 13.9 % (ref 11.5–15.5)
WBC: 4.5 10*3/uL (ref 4.0–10.5)
nRBC: 0 % (ref 0.0–0.2)

## 2022-06-06 LAB — RENAL FUNCTION PANEL
Albumin: 1.6 g/dL — ABNORMAL LOW (ref 3.5–5.0)
Anion gap: 5 (ref 5–15)
BUN: 41 mg/dL — ABNORMAL HIGH (ref 8–23)
CO2: 18 mmol/L — ABNORMAL LOW (ref 22–32)
Calcium: 7.8 mg/dL — ABNORMAL LOW (ref 8.9–10.3)
Chloride: 115 mmol/L — ABNORMAL HIGH (ref 98–111)
Creatinine, Ser: 2.08 mg/dL — ABNORMAL HIGH (ref 0.61–1.24)
GFR, Estimated: 33 mL/min — ABNORMAL LOW (ref >60)
Glucose, Bld: 88 mg/dL (ref 70–99)
Phosphorus: 2.7 mg/dL (ref 2.5–4.6)
Potassium: 4.2 mmol/L (ref 3.5–5.1)
Sodium: 138 mmol/L (ref 135–145)

## 2022-06-06 LAB — LEGIONELLA PNEUMOPHILA SEROGP 1 UR AG: L. pneumophila Serogp 1 Ur Ag: NEGATIVE

## 2022-06-06 LAB — MAGNESIUM: Magnesium: 1.8 mg/dL (ref 1.7–2.4)

## 2022-06-06 NOTE — Progress Notes (Signed)
Mobility Specialist Progress Note   06/06/22 1751  Mobility  Activity Ambulated with assistance in hallway  Level of Assistance Minimal assist, patient does 75% or more  Assistive Device Front wheel walker  Distance Ambulated (ft) 200 ft  Activity Response Tolerated well  Mobility Referral Yes  $Mobility charge 1 Mobility   Pre Mobility:84 HR During Mobility: 130 HR Post Mobility: 92 HR, 95% SpO2 on RA   Received in bed having no complaints and agreeable. MinA to stand but CGA for ambulation, pt adamant about MS not touching gait belt. No fault during ambulation but pt cued on narrow gait. Returned back to bed w/ call bell in reach and bed alarm on.   Edward Hopkins Mobility Specialist Please contact via SecureChat or  Rehab office at [phone]

## 2022-06-06 NOTE — Progress Notes (Signed)
Left scalp laceration noted on shift assessment; patient results occurred as result of fall prior to admission. Patient reports that he's started having headaches.  Provider made aware.

## 2022-06-06 NOTE — Progress Notes (Signed)
Progress Note    Edward Hopkins   OTL:[id number]  DOB: 21-Feb-1951  DOA: 06/01/2022     4 PCP: Cyndi Bender, PA-C  Initial CC: SOB, cough, weakness  Hospital Course: [name] is a 72 yo male with PMH HTN, cocaine use, hx ruptured type B descending thoracic dissection on 08/01/2021 s/p stent graft repair, HCV s/p treatment, bilateral inguinal hernias, HFpEF, hx DVT s/p IVC filter 07/2014, endocarditis, OM, CKDIV.  Patient presented with approximately 3-day history of shortness of breath, cough, weakness, body aches. On testing in the ER he was found to be influenza A positive on swab and CXR showed airspace disease involving left midlung and small left pleural effusion. He was started on Tamiflu and Rocephin/doxycycline. Other notable findings on workup on admission was that creatinine was increased above baseline, 3.34 on workup.  He endorsed a poor appetite prior to hospitalization with poor intake. He was started on fluids and nephrology was also consulted on admission.  Renal function slowly improved with fluids and trending.  Interval History:  No events overnight.  Renal function continues to recover.  Patient has no complaints or concerns this morning.  Told him tomorrow would probably be good timing for discharge if renal function further improves overnight.  Assessment and Plan:  Influenza A Pna due to Influenza and bacterial pna Sepsis due to flu and bacterial pna -Febrile, tachycardia, tachypnea -Influenza A positive.  CXR also concerning for left-sided pneumonia considered superimposed bacterial infection - PCT downtrended (20.41 >> 13.81 >> 6.95 >> 3.64) - continue rocephin and doxy to complete course x 5 days   AKI on CKD stage IV Non-AGMA AKI likely prerenal from dehydration in the setting of viral illness and very poor p.o. intake. At risk for ATN if prolonged pre-renal - baseline creat ~2.1 - 2.4 - initial BUN 67, creat 3.34 -Renal ultrasound showing asymmetric  atrophic right kidney; no hydronephrosis - Continue fluids and trend renal function -Creatinine has down trended well over the past few days.  Also having appropriate urine output - If renal function still improved/stable on Wednesday, would be stable for discharging home with home health -Outpatient follow-up with nephrology   Thrombocytopenia Likely secondary to acute viral infection.  No signs of bleeding. -Continue to monitor   Mild troponin elevation Likely secondary to demand ischemia.  ACS less likely as troponin flat (38 > 32) and patient is not endorsing chest pain. -Cardiac monitoring   QT prolongation QTc 515.   - monitoring electrolytes -Avoid QT prolonging drugs   History of cocaine abuse - UDS positive for cocaine and THC   Hypertension Stable -Continue to monitor blood pressure.   Chronic HFpEF Echo done in April 2023 showing EF 60 to 97%, grade 2 diastolic dysfunction. No significant elevation of BNP. -Monitor volume status closely   Old records reviewed in assessment of this patient  Antimicrobials: Doxycycline 06/02/2022 >> current Rocephin 06/02/2022 >> current Tamiflu 06/02/2022 >> current  DVT prophylaxis:  Place and maintain sequential compression device Start: 06/02/22 0332   Code Status:   Code Status: Full Code  Mobility Assessment (last 72 hours)     Mobility Assessment     Row Name 06/06/22 0925 06/05/22 1500 06/04/22 2207 06/03/22 2125     Does patient have an order for bedrest or is patient medically unstable No - Continue assessment -- No - Continue assessment No - Continue assessment    What is the highest level of mobility based on the progressive mobility assessment? Level  4 (Walks with assist in room) - Balance while marching in place and cannot step forward and back - Complete Level 4 (Walks with assist in room) - Balance while marching in place and cannot step forward and back - Complete Level 2 ([place]) - Balance while sitting on  edge of bed and cannot stand Level 2 ([place]) - Balance while sitting on edge of bed and cannot stand    Is the above level different from baseline mobility prior to current illness? Yes - Recommend PT order -- Yes - Recommend PT order --             Barriers to discharge:  Disposition Plan: Home with [facility] Wednesday Status is: Inpatient  Objective: Blood pressure (!) 147/77, pulse 62, temperature 98.7 F (37.1 C), temperature source Oral, resp. rate 15, height 5\' 9"  (1.753 m), weight 70.1 kg, SpO2 98 %.  Examination:  Physical Exam Constitutional:      General: He is not in acute distress.    Comments: Improved lethargy daily  HENT:     Head: Normocephalic and atraumatic.     Mouth/Throat:     Mouth: Mucous membranes are moist.  Eyes:     Extraocular Movements: Extraocular movements intact.  Cardiovascular:     Rate and Rhythm: Normal rate and regular rhythm.     Heart sounds: Normal heart sounds.  Pulmonary:     Effort: Pulmonary effort is normal. No respiratory distress.     Breath sounds: No wheezing.  Abdominal:     General: Bowel sounds are normal. There is no distension.     Palpations: Abdomen is soft.     Tenderness: There is no abdominal tenderness.  Genitourinary:    Comments: Yellow urine appreciated in urinal Musculoskeletal:        General: Normal range of motion.     Cervical back: Normal range of motion and neck supple.  Skin:    General: Skin is warm and dry.  Neurological:     General: No focal deficit present.     Mental Status: He is alert.  Psychiatric:        Mood and Affect: Mood normal.        Behavior: Behavior normal.      Consultants:  Nephrology - signed off 1/22  Procedures:    Data Reviewed: Results for orders placed or performed during the hospital encounter of 06/01/22 (from the past 24 hour(s))  Renal function panel     Status: Abnormal   Collection Time: 06/06/22  6:19 AM  Result Value Ref Range   Sodium 138 135 - 145  mmol/L   Potassium 4.2 3.5 - 5.1 mmol/L   Chloride 115 (H) 98 - 111 mmol/L   CO2 18 (L) 22 - 32 mmol/L   Glucose, Bld 88 70 - 99 mg/dL   BUN 41 (H) 8 - 23 mg/dL   Creatinine, Ser 2.08 (H) 0.61 - 1.24 mg/dL   Calcium 7.8 (L) 8.9 - 10.3 mg/dL   Phosphorus 2.7 2.5 - 4.6 mg/dL   Albumin 1.6 (L) 3.5 - 5.0 g/dL   GFR, Estimated 33 (L) >60 mL/min   Anion gap 5 5 - 15  CBC with Differential/Platelet     Status: Abnormal   Collection Time: 06/06/22  6:19 AM  Result Value Ref Range   WBC 4.5 4.0 - 10.5 K/uL   RBC 3.08 (L) 4.22 - 5.81 MIL/uL   Hemoglobin 8.9 (L) 13.0 - 17.0 g/dL   HCT 26.9 (L)  39.0 - 52.0 %   MCV 87.3 80.0 - 100.0 fL   MCH 28.9 26.0 - 34.0 pg   MCHC 33.1 30.0 - 36.0 g/dL   RDW 13.9 11.5 - 15.5 %   Platelets 130 (L) 150 - 400 K/uL   nRBC 0.0 0.0 - 0.2 %   Neutrophils Relative % 39 %   Neutro Abs 1.7 1.7 - 7.7 K/uL   Lymphocytes Relative 49 %   Lymphs Abs 2.2 0.7 - 4.0 K/uL   Monocytes Relative 7 %   Monocytes Absolute 0.3 0.1 - 1.0 K/uL   Eosinophils Relative 4 %   Eosinophils Absolute 0.2 0.0 - 0.5 K/uL   Basophils Relative 0 %   Basophils Absolute 0.0 0.0 - 0.1 K/uL   Immature Granulocytes 1 %   Abs Immature Granulocytes 0.04 0.00 - 0.07 K/uL  Magnesium     Status: None   Collection Time: 06/06/22  6:19 AM  Result Value Ref Range   Magnesium 1.8 1.7 - 2.4 mg/dL    I have reviewed pertinent nursing notes, vitals, labs, and images as necessary. I have ordered labwork to follow up on as indicated.  I have reviewed the last notes from staff over past 24 hours. I have discussed patient's care plan and test results with nursing staff, CM/SW, and other staff as appropriate.  Time spent: Greater than 50% of the 55 minute visit was spent in counseling/coordination of care for the patient as laid out in the A&P.   LOS: 4 days   Dwyane Dee, MD [facility] 06/06/2022, 11:34 AM

## 2022-06-06 NOTE — Progress Notes (Signed)
Mobility Specialist Progress Note   06/06/22 1200  Mobility  Activity Moved into chair position in bed  Level of Assistance Modified independent, requires aide device or extra time  Assistive Device None  Activity Response Tolerated well  Mobility Referral Yes  $Mobility charge 1 Mobility   Pt currently eating dinner and deferring mobility but agreeable to sit up in a manageable position to eat. Pt able to pull self up in bed w/o assistance. Left w/ call bell in reach and food tray in front. Will follow up later if time permits.  Holland Falling Mobility Specialist Please contact via SecureChat or  Rehab office at [phone]

## 2022-06-06 NOTE — Progress Notes (Signed)
Physical Therapy Treatment Patient Details Name: Edward Hopkins MRN: [id number] DOB: 09-05-1950 Today's Date: 06/06/2022   History of Present Illness Pt is a 72 y/o male who presents 06/02/2022 with PNA 2 flu, AKI and sepsis. PMH significant for CHF, DVT, endocarditis, HTN, intramural aortic hematoma, osteomyelitis, IVC filter, septic arthritis.    PT Comments    Pt progressing towards physical therapy goals. Pt reports that he is ambulating near baseline of function, and he normally is very flexed due to low back pain and osteomyelitis in his hips. Pt noncommittal to arranging more aide hours and vague regarding being able to get in touch with his aide or the company. He is declining SNF level rehab and would prefer to return home. Recommend pt have increased aide hours if possible and utilize the RW for all OOB mobility. Will continue to follow and progress as able per POC.    Recommendations for follow up therapy are one component of a multi-disciplinary discharge planning process, led by the attending physician.  Recommendations may be updated based on patient status, additional functional criteria and insurance authorization.  Follow Up Recommendations  Home health PT (Pt refusing SNF, wants to go home)     Assistance Recommended at Discharge Intermittent Supervision/Assistance  Patient can return home with the following A little help with walking and/or transfers;A little help with bathing/dressing/bathroom;Assistance with cooking/housework;Help with stairs or ramp for entrance;Assist for transportation   Equipment Recommendations  None recommended by PT    Recommendations for Other Services       Precautions / Restrictions Precautions Precautions: Fall Restrictions Weight Bearing Restrictions: No     Mobility  Bed Mobility Overal bed mobility: Needs Assistance Bed Mobility: Supine to Sit, Sit to Supine     Supine to sit: Supervision Sit to supine: Supervision    General bed mobility comments: Increased time and effort to transition fully to/from EOB, however no assist required.    Transfers Overall transfer level: Needs assistance Equipment used: Rolling walker (2 wheels) Transfers: Sit to/from Stand Sit to Stand: Min assist           General transfer comment: Pt requesting assistance to power up to full stand. Explained pt needed to try on his own as he will not have 24/7 assistance. Pt able to complete with very light min assist.    Ambulation/Gait Ambulation/Gait assistance: Supervision, Min guard Gait Distance (Feet): 200 Feet Assistive device: Rolling walker (2 wheels) Gait Pattern/deviations: Decreased stride length, Leaning posteriorly, Narrow base of support, Shuffle Gait velocity: Decreased Gait velocity interpretation: 1.31 - 2.62 ft/sec, indicative of limited community ambulator   General Gait Details: Pt able to progress to hallway ambulation this session. Grossly flexed over walker and unable to improve posture. Pt states he has not been able to stand up straight in years due to low back pain and osteomyelitis in his hips.   Stairs             Wheelchair Mobility    Modified Rankin (Stroke Patients Only)       Balance Overall balance assessment: Needs assistance Sitting-balance support: Feet supported, No upper extremity supported Sitting balance-Leahy Scale: Fair     Standing balance support: No upper extremity supported, During functional activity Standing balance-Leahy Scale: Fair                              Cognition Arousal/Alertness: Awake/alert Behavior During Therapy: WFL for tasks assessed/performed Overall  Cognitive Status: Impaired/Different from baseline Area of Impairment: Safety/judgement, Problem solving                         Safety/Judgement: Decreased awareness of safety, Decreased awareness of deficits   Problem Solving: Slow processing, Requires verbal  cues General Comments: Contradicts himself several times throughout session regarding planning for d/c. At end of evaluation yesterday pt agreed to get in touch with his aide to confirm she could provide more hours for him at d/c. This morning pt reports he never said that and states he does not have her number to call. At end of session pt reports he will call aide and update her on his d/c later this week as she will be the one taking him home. When asked about having her phone number, he states he never said he didnt have her number.        Exercises      General Comments        Pertinent Vitals/Pain Pain Assessment Pain Assessment: No/denies pain    Home Living                          Prior Function            PT Goals (current goals can now be found in the care plan section) Acute Rehab PT Goals Patient Stated Goal: Return home with increased aide hours PT Goal Formulation: With patient Time For Goal Achievement: 06/12/22 Potential to Achieve Goals: Good Progress towards PT goals: Progressing toward goals    Frequency    Min 3X/week      PT Plan Current plan remains appropriate    Co-evaluation              AM-PAC PT "6 Clicks" Mobility   Outcome Measure  Help needed turning from your back to your side while in a flat bed without using bedrails?: A Little Help needed moving from lying on your back to sitting on the side of a flat bed without using bedrails?: A Little Help needed moving to and from a bed to a chair (including a wheelchair)?: A Little Help needed standing up from a chair using your arms (e.g., wheelchair or bedside chair)?: A Little Help needed to walk in hospital room?: A Little Help needed climbing 3-5 steps with a railing? : A Little 6 Click Score: 18    End of Session Equipment Utilized During Treatment: Gait belt Activity Tolerance: Patient tolerated treatment well Patient left: in chair;with call bell/phone within  reach;with chair alarm set Nurse Communication: Mobility status PT Visit Diagnosis: Unsteadiness on feet (R26.81);Difficulty in walking, not elsewhere classified (R26.2)     Time: 1137-1200 PT Time Calculation (min) (ACUTE ONLY): 23 min  Charges:  $Gait Training: 23-37 mins                     Rolinda Roan, PT, DPT Acute Rehabilitation Services Secure Chat Preferred Office: [phone]    Thelma Comp 06/06/2022, 1:26 PM

## 2022-06-07 DIAGNOSIS — D696 Thrombocytopenia, unspecified: Secondary | ICD-10-CM | POA: Diagnosis not present

## 2022-06-07 DIAGNOSIS — J101 Influenza due to other identified influenza virus with other respiratory manifestations: Secondary | ICD-10-CM | POA: Diagnosis not present

## 2022-06-07 DIAGNOSIS — J189 Pneumonia, unspecified organism: Secondary | ICD-10-CM | POA: Diagnosis not present

## 2022-06-07 LAB — CBC WITH DIFFERENTIAL/PLATELET
Abs Immature Granulocytes: 0.08 10*3/uL — ABNORMAL HIGH (ref 0.00–0.07)
Basophils Absolute: 0 10*3/uL (ref 0.0–0.1)
Basophils Relative: 0 %
Eosinophils Absolute: 0.2 10*3/uL (ref 0.0–0.5)
Eosinophils Relative: 4 %
HCT: 27.9 % — ABNORMAL LOW (ref 39.0–52.0)
Hemoglobin: 9.4 g/dL — ABNORMAL LOW (ref 13.0–17.0)
Immature Granulocytes: 2 %
Lymphocytes Relative: 45 %
Lymphs Abs: 2.3 10*3/uL (ref 0.7–4.0)
MCH: 29.7 pg (ref 26.0–34.0)
MCHC: 33.7 g/dL (ref 30.0–36.0)
MCV: 88.3 fL (ref 80.0–100.0)
Monocytes Absolute: 0.4 10*3/uL (ref 0.1–1.0)
Monocytes Relative: 7 %
Neutro Abs: 2.2 10*3/uL (ref 1.7–7.7)
Neutrophils Relative %: 42 %
Platelets: 180 10*3/uL (ref 150–400)
RBC: 3.16 MIL/uL — ABNORMAL LOW (ref 4.22–5.81)
RDW: 13.8 % (ref 11.5–15.5)
WBC: 5.2 10*3/uL (ref 4.0–10.5)
nRBC: 0 % (ref 0.0–0.2)

## 2022-06-07 LAB — RENAL FUNCTION PANEL
Albumin: 1.7 g/dL — ABNORMAL LOW (ref 3.5–5.0)
Anion gap: 4 — ABNORMAL LOW (ref 5–15)
BUN: 34 mg/dL — ABNORMAL HIGH (ref 8–23)
CO2: 20 mmol/L — ABNORMAL LOW (ref 22–32)
Calcium: 7.9 mg/dL — ABNORMAL LOW (ref 8.9–10.3)
Chloride: 115 mmol/L — ABNORMAL HIGH (ref 98–111)
Creatinine, Ser: 1.81 mg/dL — ABNORMAL HIGH (ref 0.61–1.24)
GFR, Estimated: 39 mL/min — ABNORMAL LOW (ref >60)
Glucose, Bld: 94 mg/dL (ref 70–99)
Phosphorus: 2.6 mg/dL (ref 2.5–4.6)
Potassium: 4.5 mmol/L (ref 3.5–5.1)
Sodium: 139 mmol/L (ref 135–145)

## 2022-06-07 LAB — MAGNESIUM: Magnesium: 1.7 mg/dL (ref 1.7–2.4)

## 2022-06-07 MED ORDER — ENSURE ENLIVE PO LIQD: 237.0000 mL | Freq: Two times a day (BID) | ORAL | 2 refills | Status: AC

## 2022-06-07 MED ORDER — ADULT MULTIVITAMIN W/MINERALS CH: 1.0000 | ORAL_TABLET | Freq: Every day | ORAL | Status: AC

## 2022-06-07 MED ORDER — GUAIFENESIN ER 600 MG PO TB12: 600.0000 mg | ORAL_TABLET | Freq: Two times a day (BID) | ORAL | 0 refills | Status: AC | PRN

## 2022-06-07 NOTE — Progress Notes (Signed)
Wound care management  has been reinforced with instructions.. Pt verbalized understanding with instructions. NO complaints.

## 2022-06-07 NOTE — TOC Transition Note (Signed)
Transition of Care [facility]) - CM/SW Discharge Note   Patient Details  Name: Edward Hopkins MRN: [id number] Date of Birth: 1951/01/30  Transition of Care [facility]) CM/SW Contact:  Sharin Mons, RN Phone Number: 06/07/2022, 11:40 AM   Clinical Narrative:    Patient will DC to: home Anticipated DC date: 06/07/2022 Family notified: yes Transport by: car   Per MD patient ready for DC today. RN, patient and patient's family notified of DC.  States also notified aide Otila Kluver of  d/c plan. Pt states cousin Dominica Severin is available to pick him up by 3pm. Pt refused SNF placement . Order noted for home health PT services. Pt is agreeable. Pt without provider choice. Referral made with [facility] and accepted, [facility] 1/29. Pt without DME needs. Post hospital f/u noted on AVS. Pt without RX med concerns.  RNCM will sign off for now as intervention is no longer needed. Please consult [place] again if new needs arise.    Final next level of care: Home w Home Health Services Barriers to Discharge: No Barriers Identified   Patient Goals and CMS Choice CMS Medicare.gov Compare Post Acute Care list provided to:: Patient Choice offered to / list presented to : Patient  Discharge Placement                         Discharge Plan and Services Additional resources added to the After Visit Summary for     Discharge Planning Services: CM Consult Post Acute Care Choice: Home Health            DME Agency: NA       HH Arranged: PT HH Agency: [facility] Agency Contacted: 06/07/22 Time HH Agency Contacted: [id number] Representative spoke with at [place]: [place] Interventions SDOH Screenings   Food Insecurity: No Food Insecurity (06/02/2022)  Housing: Low Risk  (06/02/2022)  Transportation Needs: No Transportation Needs (06/02/2022)  Utilities: Not At Risk (06/02/2022)  Tobacco Use: High Risk (06/01/2022)     Readmission Risk  Interventions    08/11/2021    1:23 PM  Readmission Risk Prevention Plan  Transportation Screening Complete  PCP or Specialist Appt within 3-5 Days Complete  HRI or [place] Complete  Social Work Consult for [place] Planning/Counseling Complete  Palliative Care Screening Not Applicable  Medication Review Press photographer) Complete

## 2022-06-07 NOTE — Discharge Summary (Signed)
Physician Discharge Summary   Patient: Edward Hopkins MRN: [id number] DOB: 04-13-1951  Admit date:     06/01/2022  Discharge date: 06/07/22  Discharge Physician: Hosie Poisson   PCP: Cyndi Bender, PA-C   Recommendations at discharge:  PLEASE check cbc and bmp in one week.  Please follow up with pcp in one week.   Discharge Diagnoses: Principal Problem:   Influenza A Active Problems:   Acute renal failure superimposed on stage 4 chronic kidney disease ([place])   Sepsis ([place])   CAP (community acquired pneumonia)   Metabolic acidosis   QT prolongation   Hypotension   Thrombocytopenia (HCC)   Elevated troponin   Chronic heart failure with preserved ejection fraction (HCC)   Pressure injury of skin   Protein-calorie malnutrition, severe    Hospital Course: Mr. Bazinet is a 72 yo male with PMH HTN, cocaine use, hx ruptured type B descending thoracic dissection on 08/01/2021 s/p stent graft repair, HCV s/p treatment, bilateral inguinal hernias, HFpEF, hx DVT s/p IVC filter 07/2014, endocarditis, OM, CKDIV.  Patient presented with approximately 3-day history of shortness of breath, cough, weakness, body aches. On testing in the ER he was found to be influenza A positive on swab and CXR showed airspace disease involving left midlung and small left pleural effusion. He was started on Tamiflu and Rocephin/doxycycline. Other notable findings on workup on admission was that creatinine was increased above baseline, 3.34 on workup.  He endorsed a poor appetite prior to hospitalization with poor intake. He was started on fluids and nephrology was also consulted on admission.  Renal function slowly improved with fluids and trending.  Assessment and Plan:  Influenza A Pna due to Influenza and bacterial pna Sepsis due to flu and bacterial pna -Febrile, tachycardia, tachypnea -Influenza A positive.  CXR also concerning for left-sided pneumonia considered superimposed bacterial infection - PCT  downtrended (20.41 >> 13.81 >> 6.95 >> 3.64) - completed the course of antibiotics.    AKI on CKD stage IV Non-AGMA AKI likely prerenal from dehydration in the setting of viral illness and very poor p.o. intake. At risk for ATN if prolonged pre-renal - baseline creat ~2.1 - 2.4 - initial BUN 67, creat 3.34 -Renal ultrasound showing asymmetric atrophic right kidney; no hydronephrosis - with IV fluids, creatinine is back to baseline.     Thrombocytopenia Resolved.    Mild troponin elevation Likely secondary to demand ischemia.  ACS less likely as troponin flat (38 > 32) and patient is not endorsing chest pain.    QT prolongation Keep K greater than 4.    History of cocaine abuse - UDS positive for cocaine and THC   Hypertension Slightly elevated.    Chronic HFpEF Echo done in April 2023 showing EF 60 to 16%, grade 2 diastolic dysfunction. No significant elevation of BNP. -Monitor volume status closely        Consultants: none.  Procedures performed: none.   Disposition: Home Diet recommendation:  Discharge Diet Orders (From admission, onward)     Start     Ordered   06/07/22 0000  Diet - low sodium heart healthy        06/07/22 1122           Cardiac diet DISCHARGE MEDICATION: Allergies as of 06/07/2022   No Known Allergies      Medication List     TAKE these medications    amLODipine 5 MG tablet Commonly known as: NORVASC Take 5 mg by mouth daily.  aspirin EC 81 MG tablet Take 1 tablet (81 mg total) by mouth daily. Swallow whole.   buprenorphine 15 MCG/HR Commonly known as: BUTRANS Place 1 patch onto the skin once a week. Monday   feeding supplement Liqd Take 237 mLs by mouth 2 (two) times daily between meals.   guaiFENesin 600 MG 12 hr tablet Commonly known as: MUCINEX Take 1 tablet (600 mg total) by mouth 2 (two) times daily as needed for to loosen phlegm.   metoprolol succinate 25 MG 24 hr tablet Commonly known as: TOPROL-XL Take 25  mg by mouth daily.   multivitamin with minerals Tabs tablet Take 1 tablet by mouth daily. Start taking on: June 08, 2022   naloxone 4 MG/0.1ML Liqd nasal spray kit Commonly known as: NARCAN Place 1 spray into the nose once.   oxyCODONE 15 MG immediate release tablet Commonly known as: ROXICODONE Take 15 mg by mouth 5 (five) times daily as needed for pain.               Discharge Care Instructions  (From admission, onward)           Start     Ordered   06/07/22 0000  Discharge wound care:       Comments: Wound care to deep tissue pressure injury (POA) on left hip: Cleanse with NS, pat dry. Cover with xeroform gauze Kellie Simmering # [id number]), top with dry gauze and secure with silicone foam dressing. Change daily. Position off of left side (turn from supine to right side.  06/03/22 0830   06/07/22 1122            Follow-up Information     Cyndi Bender, PA-C Follow up.   Specialty: Physician Assistant Contact information: [place] [phone]                Discharge Exam: Danley Danker Weights   06/01/22 2240 06/02/22 1400  Weight: 77.1 kg 70.1 kg   General exam: Appears calm and comfortable  Respiratory system: Clear to auscultation. Respiratory effort normal. Cardiovascular system: S1 & S2 heard, RRR. No JVD, . No pedal edema. Gastrointestinal system: Abdomen is nondistended, soft and nontender.  Central nervous system: Alert and oriented. No focal neurological deficits. Extremities: Symmetric 5 x 5 power. Skin: No rashes, Psychiatry:  Mood & affect appropriate.    Condition at discharge: fair  The results of significant diagnostics from this hospitalization (including imaging, microbiology, ancillary and laboratory) are listed below for reference.   Imaging Studies: US RENAL  Result Date: 06/02/2022 CLINICAL DATA:  [id number] Acute renal failure (ARF) ([place]) [id number] EXAM: RENAL / URINARY TRACT ULTRASOUND COMPLETE  COMPARISON:  07/31/2021 FINDINGS: The right kidney measured 6.9 cm and the left kidney measured 11.6 cm. Right kidney demonstrates abnormally echogenic parenchyma with renal cortical thinning consistent with scarring. Left kidney demonstrates normal parenchymal echogenicity and no evidence for renal cortical scarring. Right kidney lower pole simple appearing cyst measures 2.5 cm. No shadowing stones are seen. No hydronephrosis. Incidental note is made of a inferior right lobe hepatic cyst measuring 1.6 cm. Limited images of the urinary bladder are grossly unremarkable. IMPRESSION: 1. Asymmetrically atrophic and echogenic right kidney which may be due to underlying renal artery stenosis. This represents a change compared to the previous examination. 2. Right kidney cyst. 3. Cyst in the right lobe of the liver. Electronically Signed   By: Sammie Bench M.D.   On: 06/02/2022 11:08   DG Chest 2 View  Result Date: 06/01/2022 CLINICAL DATA:  Shortness of breath and joint pain. EXAM: CHEST - 2 VIEW COMPARISON:  08/15/2021 FINDINGS: Postoperative changes in the mediastinum with sternotomy wires and thoracic aortic stent graft. Surgical clips in the right upper chest. Heart size and pulmonary vascularity are normal. Airspace infiltration is demonstrated in the left mid lung, likely in the superior segment of the left lower lung. This is likely to represent pneumonia. Probable small left pleural effusion. Right lung is clear. IMPRESSION: Airspace disease in the left mid lung with small left pleural effusion, likely pneumonia. Right lung is clear. Electronically Signed   By: Lucienne Capers M.D.   On: 06/01/2022 23:26    Microbiology: Results for orders placed or performed during the hospital encounter of 06/01/22  Resp panel by RT-PCR (RSV, Flu A&B, Covid) Anterior Nasal Swab     Status: Abnormal   Collection Time: 06/01/22 10:46 PM   Specimen: Anterior Nasal Swab  Result Value Ref Range Status   SARS  Coronavirus 2 by RT PCR NEGATIVE NEGATIVE Final    Comment: (NOTE) SARS-CoV-2 target nucleic acids are NOT DETECTED.  The SARS-CoV-2 RNA is generally detectable in upper respiratory specimens during the acute phase of infection. The lowest concentration of SARS-CoV-2 viral copies this assay can detect is 138 copies/mL. A negative result does not preclude SARS-Cov-2 infection and should not be used as the sole basis for treatment or other patient management decisions. A negative result may occur with  improper specimen collection/handling, submission of specimen other than nasopharyngeal swab, presence of viral mutation(s) within the areas targeted by this assay, and inadequate number of viral copies(<138 copies/mL). A negative result must be combined with clinical observations, patient history, and epidemiological information. The expected result is Negative.  Fact Sheet for Patients:  EntrepreneurPulse.com.au  Fact Sheet for Healthcare Providers:  IncredibleEmployment.be  This test is no t yet approved or cleared by the [place] FDA and  has been authorized for detection and/or diagnosis of SARS-CoV-2 by FDA under an Emergency Use Authorization (EUA). This EUA will remain  in effect (meaning this test can be used) for the duration of the COVID-19 declaration under Section 564(b)(1) of the Act, 21 U.S.C.section 360bbb-3(b)(1), unless the authorization is terminated  or revoked sooner.       Influenza A by PCR POSITIVE (A) NEGATIVE Final   Influenza B by PCR NEGATIVE NEGATIVE Final    Comment: (NOTE) The Xpert Xpress SARS-CoV-2/FLU/RSV plus assay is intended as an aid in the diagnosis of influenza from Nasopharyngeal swab specimens and should not be used as a sole basis for treatment. Nasal washings and aspirates are unacceptable for Xpert Xpress SARS-CoV-2/FLU/RSV testing.  Fact Sheet for  Patients: EntrepreneurPulse.com.au  Fact Sheet for Healthcare Providers: IncredibleEmployment.be  This test is not yet approved or cleared by the [place] FDA and has been authorized for detection and/or diagnosis of SARS-CoV-2 by FDA under an Emergency Use Authorization (EUA). This EUA will remain in effect (meaning this test can be used) for the duration of the COVID-19 declaration under Section 564(b)(1) of the Act, 21 U.S.C. section 360bbb-3(b)(1), unless the authorization is terminated or revoked.     Resp Syncytial Virus by PCR NEGATIVE NEGATIVE Final    Comment: (NOTE) Fact Sheet for Patients: EntrepreneurPulse.com.au  Fact Sheet for Healthcare Providers: IncredibleEmployment.be  This test is not yet approved or cleared by the [place] FDA and has been authorized for detection and/or diagnosis of SARS-CoV-2 by FDA under an Emergency Use Authorization (  EUA). This EUA will remain in effect (meaning this test can be used) for the duration of the COVID-19 declaration under Section 564(b)(1) of the Act, 21 U.S.C. section 360bbb-3(b)(1), unless the authorization is terminated or revoked.  Performed at [facility], [place] [address] [place], [place] [address]     Labs: CBC: Recent Labs  Lab 06/03/22 [phone] 06/04/22 0350 06/05/22 0309 06/06/22 0619 06/07/22 0304  WBC 5.4 4.5 4.9 4.5 5.2  NEUTROABS  --  2.2 2.2 1.7 2.2  HGB 9.6* 9.7* 9.3* 8.9* 9.4*  HCT 27.9* 29.3* 28.4* 26.9* 27.9*  MCV 85.8 86.2 87.9 87.3 88.3  PLT 70* 77* 97* 130* 919   Basic Metabolic Panel: Recent Labs  Lab 06/03/22 0336 06/04/22 0350 06/05/22 0309 06/06/22 0619 06/07/22 0304  NA 137 137 137 138 139  K 4.0 4.4 4.3 4.2 4.5  CL 111 109 113* 115* 115*  CO2 18* 17* 17* 18* 20*  GLUCOSE 89 85 93 88 94  BUN 75* 68* 56* 41* 34*  CREATININE 3.55* 3.23* 2.48* 2.08* 1.81*  CALCIUM 8.1* 8.0* 7.7* 7.8* 7.9*   MG 2.1 2.1 1.8 1.8 1.7  PHOS  --  2.7 2.4* 2.7 2.6   Liver Function Tests: Recent Labs  Lab 06/04/22 0350 06/05/22 0309 06/06/22 0619 06/07/22 0304  ALBUMIN 1.8* 1.6* 1.6* 1.7*   CBG: Recent Labs  Lab 06/02/22 1532  GLUCAP 102*    Discharge time spent: 36 minutes  Signed: Hosie Poisson, MD [facility] 06/07/2022

## 2022-06-07 NOTE — Progress Notes (Signed)
Discharge summary provided to pt by swot team staff, Pt d/c to home as ordered. Pt remains alert/oriented in no apparent distress. Pt is not a candidate to discharge lounge d/t droplet precautions. Per pt family is responsible for his ride.,and according to pt  ride will be here around 1500.

## 2022-06-12 DIAGNOSIS — I4581 Long QT syndrome: Secondary | ICD-10-CM | POA: Diagnosis not present

## 2022-06-12 DIAGNOSIS — I130 Hypertensive heart and chronic kidney disease with heart failure and stage 1 through stage 4 chronic kidney disease, or unspecified chronic kidney disease: Secondary | ICD-10-CM | POA: Diagnosis not present

## 2022-06-12 DIAGNOSIS — E43 Unspecified severe protein-calorie malnutrition: Secondary | ICD-10-CM | POA: Diagnosis not present

## 2022-06-12 DIAGNOSIS — Z7982 Long term (current) use of aspirin: Secondary | ICD-10-CM | POA: Diagnosis not present

## 2022-06-12 DIAGNOSIS — E8721 Acute metabolic acidosis: Secondary | ICD-10-CM | POA: Diagnosis not present

## 2022-06-12 DIAGNOSIS — Z86718 Personal history of other venous thrombosis and embolism: Secondary | ICD-10-CM | POA: Diagnosis not present

## 2022-06-12 DIAGNOSIS — N184 Chronic kidney disease, stage 4 (severe): Secondary | ICD-10-CM | POA: Diagnosis not present

## 2022-06-12 DIAGNOSIS — I5032 Chronic diastolic (congestive) heart failure: Secondary | ICD-10-CM | POA: Diagnosis not present

## 2022-06-12 DIAGNOSIS — D696 Thrombocytopenia, unspecified: Secondary | ICD-10-CM | POA: Diagnosis not present

## 2022-06-13 DIAGNOSIS — I4581 Long QT syndrome: Secondary | ICD-10-CM | POA: Diagnosis not present

## 2022-06-13 DIAGNOSIS — Z86718 Personal history of other venous thrombosis and embolism: Secondary | ICD-10-CM | POA: Diagnosis not present

## 2022-06-13 DIAGNOSIS — Z7982 Long term (current) use of aspirin: Secondary | ICD-10-CM | POA: Diagnosis not present

## 2022-06-13 DIAGNOSIS — N184 Chronic kidney disease, stage 4 (severe): Secondary | ICD-10-CM | POA: Diagnosis not present

## 2022-06-13 DIAGNOSIS — E8721 Acute metabolic acidosis: Secondary | ICD-10-CM | POA: Diagnosis not present

## 2022-06-13 DIAGNOSIS — I5032 Chronic diastolic (congestive) heart failure: Secondary | ICD-10-CM | POA: Diagnosis not present

## 2022-06-13 DIAGNOSIS — E43 Unspecified severe protein-calorie malnutrition: Secondary | ICD-10-CM | POA: Diagnosis not present

## 2022-06-13 DIAGNOSIS — I130 Hypertensive heart and chronic kidney disease with heart failure and stage 1 through stage 4 chronic kidney disease, or unspecified chronic kidney disease: Secondary | ICD-10-CM | POA: Diagnosis not present

## 2022-06-13 DIAGNOSIS — D696 Thrombocytopenia, unspecified: Secondary | ICD-10-CM | POA: Diagnosis not present

## 2022-06-14 DIAGNOSIS — I10 Essential (primary) hypertension: Secondary | ICD-10-CM | POA: Diagnosis not present

## 2022-06-14 DIAGNOSIS — F17210 Nicotine dependence, cigarettes, uncomplicated: Secondary | ICD-10-CM | POA: Diagnosis not present

## 2022-06-14 DIAGNOSIS — M545 Low back pain, unspecified: Secondary | ICD-10-CM | POA: Diagnosis not present

## 2022-06-14 DIAGNOSIS — M25551 Pain in right hip: Secondary | ICD-10-CM | POA: Diagnosis not present

## 2022-06-14 DIAGNOSIS — Z79899 Other long term (current) drug therapy: Secondary | ICD-10-CM | POA: Diagnosis not present

## 2022-06-14 DIAGNOSIS — Z0000 Encounter for general adult medical examination without abnormal findings: Secondary | ICD-10-CM | POA: Diagnosis not present

## 2022-06-14 DIAGNOSIS — M25552 Pain in left hip: Secondary | ICD-10-CM | POA: Diagnosis not present

## 2022-06-14 DIAGNOSIS — Z9181 History of falling: Secondary | ICD-10-CM | POA: Diagnosis not present

## 2022-06-14 DIAGNOSIS — Z6824 Body mass index (BMI) 24.0-24.9, adult: Secondary | ICD-10-CM | POA: Diagnosis not present

## 2022-06-14 DIAGNOSIS — Z09 Encounter for follow-up examination after completed treatment for conditions other than malignant neoplasm: Secondary | ICD-10-CM | POA: Diagnosis not present

## 2022-06-16 DIAGNOSIS — Z79899 Other long term (current) drug therapy: Secondary | ICD-10-CM | POA: Diagnosis not present

## 2022-06-19 DIAGNOSIS — I4581 Long QT syndrome: Secondary | ICD-10-CM | POA: Diagnosis not present

## 2022-06-19 DIAGNOSIS — E43 Unspecified severe protein-calorie malnutrition: Secondary | ICD-10-CM | POA: Diagnosis not present

## 2022-06-19 DIAGNOSIS — I130 Hypertensive heart and chronic kidney disease with heart failure and stage 1 through stage 4 chronic kidney disease, or unspecified chronic kidney disease: Secondary | ICD-10-CM | POA: Diagnosis not present

## 2022-06-19 DIAGNOSIS — Z7982 Long term (current) use of aspirin: Secondary | ICD-10-CM | POA: Diagnosis not present

## 2022-06-19 DIAGNOSIS — E8721 Acute metabolic acidosis: Secondary | ICD-10-CM | POA: Diagnosis not present

## 2022-06-19 DIAGNOSIS — I5032 Chronic diastolic (congestive) heart failure: Secondary | ICD-10-CM | POA: Diagnosis not present

## 2022-06-19 DIAGNOSIS — N184 Chronic kidney disease, stage 4 (severe): Secondary | ICD-10-CM | POA: Diagnosis not present

## 2022-06-19 DIAGNOSIS — D696 Thrombocytopenia, unspecified: Secondary | ICD-10-CM | POA: Diagnosis not present

## 2022-06-19 DIAGNOSIS — Z86718 Personal history of other venous thrombosis and embolism: Secondary | ICD-10-CM | POA: Diagnosis not present

## 2022-06-21 DIAGNOSIS — R634 Abnormal weight loss: Secondary | ICD-10-CM | POA: Diagnosis not present

## 2022-06-21 DIAGNOSIS — I10 Essential (primary) hypertension: Secondary | ICD-10-CM | POA: Diagnosis not present

## 2022-06-21 DIAGNOSIS — D638 Anemia in other chronic diseases classified elsewhere: Secondary | ICD-10-CM | POA: Diagnosis not present

## 2022-06-21 DIAGNOSIS — N179 Acute kidney failure, unspecified: Secondary | ICD-10-CM | POA: Diagnosis not present

## 2022-06-21 DIAGNOSIS — I6930 Unspecified sequelae of cerebral infarction: Secondary | ICD-10-CM | POA: Diagnosis not present

## 2022-06-21 DIAGNOSIS — Z9889 Other specified postprocedural states: Secondary | ICD-10-CM | POA: Diagnosis not present

## 2022-06-21 DIAGNOSIS — K4020 Bilateral inguinal hernia, without obstruction or gangrene, not specified as recurrent: Secondary | ICD-10-CM | POA: Diagnosis not present

## 2022-06-21 DIAGNOSIS — Z8679 Personal history of other diseases of the circulatory system: Secondary | ICD-10-CM | POA: Diagnosis not present

## 2022-06-23 DIAGNOSIS — E43 Unspecified severe protein-calorie malnutrition: Secondary | ICD-10-CM | POA: Diagnosis not present

## 2022-06-23 DIAGNOSIS — E8721 Acute metabolic acidosis: Secondary | ICD-10-CM | POA: Diagnosis not present

## 2022-06-23 DIAGNOSIS — I5032 Chronic diastolic (congestive) heart failure: Secondary | ICD-10-CM | POA: Diagnosis not present

## 2022-06-23 DIAGNOSIS — Z86718 Personal history of other venous thrombosis and embolism: Secondary | ICD-10-CM | POA: Diagnosis not present

## 2022-06-23 DIAGNOSIS — D696 Thrombocytopenia, unspecified: Secondary | ICD-10-CM | POA: Diagnosis not present

## 2022-06-23 DIAGNOSIS — Z7982 Long term (current) use of aspirin: Secondary | ICD-10-CM | POA: Diagnosis not present

## 2022-06-23 DIAGNOSIS — I130 Hypertensive heart and chronic kidney disease with heart failure and stage 1 through stage 4 chronic kidney disease, or unspecified chronic kidney disease: Secondary | ICD-10-CM | POA: Diagnosis not present

## 2022-06-23 DIAGNOSIS — N184 Chronic kidney disease, stage 4 (severe): Secondary | ICD-10-CM | POA: Diagnosis not present

## 2022-06-23 DIAGNOSIS — I4581 Long QT syndrome: Secondary | ICD-10-CM | POA: Diagnosis not present

## 2022-06-29 ENCOUNTER — Other Ambulatory Visit: Payer: Self-pay

## 2022-06-29 ENCOUNTER — Emergency Department (HOSPITAL_COMMUNITY)
Admission: EM | Admit: 2022-06-29 | Discharge: 2022-06-29 | Disposition: A | Discharge status: Home / Self Care | Payer: 59 | Attending: Emergency Medicine | Admitting: Emergency Medicine

## 2022-06-29 ENCOUNTER — Emergency Department (HOSPITAL_COMMUNITY): Payer: 59

## 2022-06-29 ENCOUNTER — Encounter (HOSPITAL_COMMUNITY): Payer: Self-pay

## 2022-06-29 DIAGNOSIS — N3289 Other specified disorders of bladder: Secondary | ICD-10-CM | POA: Diagnosis not present

## 2022-06-29 DIAGNOSIS — Z7982 Long term (current) use of aspirin: Secondary | ICD-10-CM | POA: Insufficient documentation

## 2022-06-29 DIAGNOSIS — K469 Unspecified abdominal hernia without obstruction or gangrene: Secondary | ICD-10-CM

## 2022-06-29 DIAGNOSIS — K4020 Bilateral inguinal hernia, without obstruction or gangrene, not specified as recurrent: Secondary | ICD-10-CM | POA: Diagnosis not present

## 2022-06-29 DIAGNOSIS — K4090 Unilateral inguinal hernia, without obstruction or gangrene, not specified as recurrent: Secondary | ICD-10-CM | POA: Diagnosis not present

## 2022-06-29 DIAGNOSIS — M549 Dorsalgia, unspecified: Secondary | ICD-10-CM | POA: Diagnosis not present

## 2022-06-29 DIAGNOSIS — N281 Cyst of kidney, acquired: Secondary | ICD-10-CM | POA: Diagnosis not present

## 2022-06-29 DIAGNOSIS — Z79899 Other long term (current) drug therapy: Secondary | ICD-10-CM | POA: Insufficient documentation

## 2022-06-29 DIAGNOSIS — Z743 Need for continuous supervision: Secondary | ICD-10-CM | POA: Diagnosis not present

## 2022-06-29 LAB — CBC WITH DIFFERENTIAL/PLATELET
Abs Immature Granulocytes: 0.01 10*3/uL (ref 0.00–0.07)
Basophils Absolute: 0 10*3/uL (ref 0.0–0.1)
Basophils Relative: 1 %
Eosinophils Absolute: 0.4 10*3/uL (ref 0.0–0.5)
Eosinophils Relative: 9 %
HCT: 37.6 % — ABNORMAL LOW (ref 39.0–52.0)
Hemoglobin: 10.5 g/dL — ABNORMAL LOW (ref 13.0–17.0)
Immature Granulocytes: 0 %
Lymphocytes Relative: 49 %
Lymphs Abs: 2 10*3/uL (ref 0.7–4.0)
MCH: 28.5 pg (ref 26.0–34.0)
MCHC: 27.9 g/dL — ABNORMAL LOW (ref 30.0–36.0)
MCV: 101.9 fL — ABNORMAL HIGH (ref 80.0–100.0)
Monocytes Absolute: 0.3 10*3/uL (ref 0.1–1.0)
Monocytes Relative: 8 %
Neutro Abs: 1.4 10*3/uL — ABNORMAL LOW (ref 1.7–7.7)
Neutrophils Relative %: 33 %
Platelets: 116 10*3/uL — ABNORMAL LOW (ref 150–400)
RBC: 3.69 MIL/uL — ABNORMAL LOW (ref 4.22–5.81)
RDW: 15.9 % — ABNORMAL HIGH (ref 11.5–15.5)
WBC: 4.1 10*3/uL (ref 4.0–10.5)
nRBC: 0 % (ref 0.0–0.2)

## 2022-06-29 LAB — COMPREHENSIVE METABOLIC PANEL
ALT: 9 U/L (ref 0–44)
AST: 15 U/L (ref 15–41)
Albumin: 3 g/dL — ABNORMAL LOW (ref 3.5–5.0)
Alkaline Phosphatase: 69 U/L (ref 38–126)
Anion gap: 14 (ref 5–15)
BUN: 32 mg/dL — ABNORMAL HIGH (ref 8–23)
CO2: 17 mmol/L — ABNORMAL LOW (ref 22–32)
Calcium: 8.9 mg/dL (ref 8.9–10.3)
Chloride: 106 mmol/L (ref 98–111)
Creatinine, Ser: 1.8 mg/dL — ABNORMAL HIGH (ref 0.61–1.24)
GFR, Estimated: 39 mL/min — ABNORMAL LOW (ref >60)
Glucose, Bld: 127 mg/dL — ABNORMAL HIGH (ref 70–99)
Potassium: 5.1 mmol/L (ref 3.5–5.1)
Sodium: 137 mmol/L (ref 135–145)
Total Bilirubin: 0.2 mg/dL — ABNORMAL LOW (ref 0.3–1.2)
Total Protein: 7.2 g/dL (ref 6.5–8.1)

## 2022-06-29 LAB — URINALYSIS, ROUTINE W REFLEX MICROSCOPIC
Bilirubin Urine: NEGATIVE
Glucose, UA: NEGATIVE mg/dL
Hgb urine dipstick: NEGATIVE
Ketones, ur: NEGATIVE mg/dL
Leukocytes,Ua: NEGATIVE
Nitrite: NEGATIVE
Protein, ur: NEGATIVE mg/dL
Specific Gravity, Urine: 1.018 (ref 1.005–1.030)
pH: 5 (ref 5.0–8.0)

## 2022-06-29 MED ORDER — OXYCODONE-ACETAMINOPHEN 5-325 MG PO TABS
1.0000 | ORAL_TABLET | Freq: Once | ORAL | Status: AC
  Administered 2022-06-29: 1 via ORAL
  Filled 2022-06-29: qty 1

## 2022-06-29 NOTE — ED Provider Notes (Signed)
[facility] Provider Note   CSN: [id number] Arrival date & time: 06/29/22  1525     History  Chief Complaint  Patient presents with   Hernia    Edward Hopkins is a 72 y.o. male.  HPI    72 year old male comes in with chief complaint of abdominal pain. Patient has multiple medical comorbidities including PE, aortic dissection.  Patient states that about a month ago he started noticing pain over the lower abdomen.  He was moving some bottles when he started having sudden pain.  He has not followed up with the surgeon yet.  The pain is fairly constant, worse with coughing and walking.  More recently the pain has intensified, it is worse with any kind of movement, prompting him to come to the emergency room.  Pain is localized to his groin bilaterally.  Patient's pain sometimes radiates to the back.  Home Medications Prior to Admission medications   Medication Sig Start Date End Date Taking? Authorizing Provider  amLODipine (NORVASC) 5 MG tablet Take 5 mg by mouth daily. 03/29/22   [provider]  aspirin EC 81 MG EC tablet Take 1 tablet (81 mg total) by mouth daily. Swallow whole. 09/01/21   Love, Ivan Anchors, PA-C  buprenorphine (BUTRANS) 15 MCG/HR Place 1 patch onto the skin once a week. Monday 05/11/22   [provider]  feeding supplement (ENSURE ENLIVE / ENSURE PLUS) LIQD Take 237 mLs by mouth 2 (two) times daily between meals. 06/07/22 09/05/22  Hosie Poisson, MD  guaiFENesin (MUCINEX) 600 MG 12 hr tablet Take 1 tablet (600 mg total) by mouth 2 (two) times daily as needed for to loosen phlegm. 06/07/22   Hosie Poisson, MD  metoprolol succinate (TOPROL-XL) 25 MG 24 hr tablet Take 25 mg by mouth daily. 03/29/22   [provider]  Multiple Vitamin (MULTIVITAMIN WITH MINERALS) TABS tablet Take 1 tablet by mouth daily. 06/08/22   Hosie Poisson, MD  naloxone [facility]) nasal spray 4 mg/0.1 mL Place 1 spray into the nose once.  03/15/22   [provider]  oxyCODONE (ROXICODONE) 15 MG immediate release tablet Take 15 mg by mouth 5 (five) times daily as needed for pain. 05/14/22   [provider]      Allergies    Patient has no known allergies.    Review of Systems   Review of Systems  All other systems reviewed and are negative.   Physical Exam Updated Vital Signs BP (!) 153/61   Pulse 61   Temp 98 F (36.7 C) (Oral)   Resp 16   Ht 5' 9"$  (1.753 m)   Wt 70.1 kg   SpO2 100%   BMI 22.82 kg/m  Physical Exam Vitals and nursing note reviewed.  Constitutional:      Appearance: He is well-developed.  HENT:     Head: Atraumatic.  Cardiovascular:     Rate and Rhythm: Normal rate.  Pulmonary:     Effort: Pulmonary effort is normal.  Abdominal:     Comments: Patient has bilateral groin hernia, reducible with palpation.  No skin discoloration.  No peritonitis.  No palpable mass.  Musculoskeletal:     Cervical back: Neck supple.  Skin:    General: Skin is warm.  Neurological:     Mental Status: He is alert and oriented to person, place, and time.     ED Results / Procedures / Treatments   Labs (all labs ordered are listed, but  only abnormal results are displayed) Labs Reviewed  COMPREHENSIVE METABOLIC PANEL - Abnormal; Notable for the following components:      Result Value   CO2 17 (*)    Glucose, Bld 127 (*)    BUN 32 (*)    Creatinine, Ser 1.80 (*)    Albumin 3.0 (*)    Total Bilirubin 0.2 (*)    GFR, Estimated 39 (*)    All other components within normal limits  CBC WITH DIFFERENTIAL/PLATELET - Abnormal; Notable for the following components:   RBC 3.69 (*)    Hemoglobin 10.5 (*)    HCT 37.6 (*)    MCV 101.9 (*)    MCHC 27.9 (*)    RDW 15.9 (*)    Platelets 116 (*)    Neutro Abs 1.4 (*)    All other components within normal limits  URINALYSIS, ROUTINE W REFLEX MICROSCOPIC    EKG None  Radiology CT ABDOMEN PELVIS WO CONTRAST  Result Date: 06/29/2022 CLINICAL  DATA:  Hernia suspected EXAM: CT ABDOMEN AND PELVIS WITHOUT CONTRAST TECHNIQUE: Multidetector CT imaging of the abdomen and pelvis was performed following the standard protocol without IV contrast. RADIATION DOSE REDUCTION: This exam was performed according to the departmental dose-optimization program which includes automated exposure control, adjustment of the mA and/or kV according to patient size and/or use of iterative reconstruction technique. COMPARISON:  CT chest, abdomen and pelvis dated August 09, 2021; chest CT dated Sep 16, 2021 FINDINGS: Lower chest: New linear consolidation of the left lower lobe. Hepatobiliary: No suspicious focal liver abnormality is seen. No gallstones, gallbladder wall thickening, or biliary dilatation. Pancreas: Unremarkable. No pancreatic ductal dilatation or surrounding inflammatory changes. Spleen: Normal in size without focal abnormality. Adrenals/Urinary Tract: Bilateral adrenal glands are unremarkable. Atrophic right kidney. Hydronephrosis or nephrolithiasis. Simple appearing bilateral renal cysts, no specific follow-up imaging is recommended. Thick-walled and decompressed urinary bladder. Stomach/Bowel: Large colonic stool burden with large rectal stool ball. No wall thickening or inflammatory change. Normal appendix. Normal stomach. Vascular/Lymphatic: Postprocedural changes of prior endovascular repair of aortic dissection. Atherosclerotic disease of the aortoiliac arteries. IVC filter in place. No pathologically enlarged lymph nodes seen in the abdomen or pelvis. Reproductive: Unremarkable. Other: Small left-greater-than-right fat containing inguinal hernias. No abdominopelvic ascites. Musculoskeletal: Advanced degenerative changes of the bilateral hips. No aggressive appearing osseous lesions. IMPRESSION: 1. Small left-greater-than-right fat containing inguinal hernias. 2. Large colonic stool burden with large rectal stool ball, findings are compatible with constipation.  3. New linear consolidation of the left lower lobe, findings are likely due to aspiration or infection. 4. Thick-walled and decompressed urinary bladder, findings can be seen in the setting of cystitis. Recommend correlation with urinalysis. 5. Prior endovascular repair of aortic dissection. 6. Aortic Atherosclerosis (ICD10-I70.0). Electronically Signed   By: Yetta Glassman M.D.   On: 06/29/2022 17:08    Procedures Procedures    Medications Ordered in ED Medications  oxyCODONE-acetaminophen (PERCOCET/ROXICET) 5-325 MG per tablet 1 tablet (1 tablet Oral Given 06/29/22 2010)    ED Course/ Medical Decision Making/ A&P                             Medical Decision Making Risk Prescription drug management.   72 year old patient comes in with chief complaint of abdominal pain.  He has history of abdominal aneurysm, PE and remote history of cocaine use, dissection.  On exam, patient has clear bilateral inguinal hernia, reducible.  Patient has tenderness at the  site of the hernia.  Differential diagnosis includes incarcerated hernia, hernia with complications such as small bowel obstruction or ileus, severe constipation.  Basic labs were ordered.  Labs have been independently interpreted, they are reassuring.  CT scan of the abdomen was ordered and independently interpreted.  There is no evidence of small bowel obstruction.  Per radiologist, there is no evidence of incarceration.  Results of the ER discussed with the patient.  Follow-up instructions provided and return precautions also discussed.  Patient already receives high doses of narcotic medications regularly.  We will not prescribe any further narcotic medicine due to that.  Final Clinical Impression(s) / ED Diagnoses Final diagnoses:  Hernia of abdominal cavity    Rx / DC Orders ED Discharge Orders     None         Varney Biles, MD 06/29/22 2042

## 2022-06-29 NOTE — ED Provider Triage Note (Signed)
Emergency Medicine Provider Triage Evaluation Note  Edward Hopkins , a 72 y.o. male  was evaluated in triage.  Pt complains of bilateral inguinal hernias.  He reports he has had a large left-sided hernia for a while but it started causing him severe pain this morning and has gotten much larger.  He reports 2 days ago he also developed a smaller inguinal hernia on the right side.  He does report that he was able to have a bowel movement this morning.  Reports worsening pain throughout the day..  Review of Systems  Positive: Hernia, lower abdominal pain Negative: Constipation, vomiting  Physical Exam  BP 117/71   Pulse 84   Temp 98 F (36.7 C) (Oral)   Resp 16   Ht 5' 9"$  (1.753 m)   Wt 70.1 kg   SpO2 98%   BMI 22.82 kg/m  Gen:   Awake, no distress   Resp:  Normal effort  MSK:   Moves extremities without difficulty  Other:  Left inguinal hernia tender to palpation, partially reduces but then immediately the protrudes, smaller right hernia nontender.  Medical Decision Making  Medically screening exam initiated at 3:39 PM.  Appropriate orders placed.  GARDY LETIZIA was informed that the remainder of the evaluation will be completed by another provider, this initial triage assessment does not replace that evaluation, and the importance of remaining in the ED until their evaluation is complete.     Jacqlyn Larsen, [place] 06/29/22 1540

## 2022-06-29 NOTE — ED Triage Notes (Addendum)
Pt arrived via [facility] for lower back and  bilat hip pain. Pt states has bilat inguinal hernias and hasn't seen surgeon for them yet. Pt was able to pivot from EMS stretcher to wheel chair. Pt denies numbness and tingling.

## 2022-06-29 NOTE — Discharge Instructions (Signed)
We saw in the ER for abdominal pain. Your CT scan confirms hernia.  Please call the surgeons at the number provided to set up an appointment.  Return to the emergency room if you start having worsening pain, severe nausea and vomiting.

## 2022-07-05 DIAGNOSIS — E8721 Acute metabolic acidosis: Secondary | ICD-10-CM | POA: Diagnosis not present

## 2022-07-05 DIAGNOSIS — N184 Chronic kidney disease, stage 4 (severe): Secondary | ICD-10-CM | POA: Diagnosis not present

## 2022-07-05 DIAGNOSIS — D696 Thrombocytopenia, unspecified: Secondary | ICD-10-CM | POA: Diagnosis not present

## 2022-07-05 DIAGNOSIS — E43 Unspecified severe protein-calorie malnutrition: Secondary | ICD-10-CM | POA: Diagnosis not present

## 2022-07-05 DIAGNOSIS — I5032 Chronic diastolic (congestive) heart failure: Secondary | ICD-10-CM | POA: Diagnosis not present

## 2022-07-05 DIAGNOSIS — I130 Hypertensive heart and chronic kidney disease with heart failure and stage 1 through stage 4 chronic kidney disease, or unspecified chronic kidney disease: Secondary | ICD-10-CM | POA: Diagnosis not present

## 2022-07-05 DIAGNOSIS — Z7982 Long term (current) use of aspirin: Secondary | ICD-10-CM | POA: Diagnosis not present

## 2022-07-05 DIAGNOSIS — I4581 Long QT syndrome: Secondary | ICD-10-CM | POA: Diagnosis not present

## 2022-07-05 DIAGNOSIS — Z86718 Personal history of other venous thrombosis and embolism: Secondary | ICD-10-CM | POA: Diagnosis not present

## 2022-07-10 DIAGNOSIS — I130 Hypertensive heart and chronic kidney disease with heart failure and stage 1 through stage 4 chronic kidney disease, or unspecified chronic kidney disease: Secondary | ICD-10-CM | POA: Diagnosis not present

## 2022-07-10 DIAGNOSIS — D696 Thrombocytopenia, unspecified: Secondary | ICD-10-CM | POA: Diagnosis not present

## 2022-07-10 DIAGNOSIS — N184 Chronic kidney disease, stage 4 (severe): Secondary | ICD-10-CM | POA: Diagnosis not present

## 2022-07-10 DIAGNOSIS — I4581 Long QT syndrome: Secondary | ICD-10-CM | POA: Diagnosis not present

## 2022-07-10 DIAGNOSIS — Z7982 Long term (current) use of aspirin: Secondary | ICD-10-CM | POA: Diagnosis not present

## 2022-07-10 DIAGNOSIS — I5032 Chronic diastolic (congestive) heart failure: Secondary | ICD-10-CM | POA: Diagnosis not present

## 2022-07-10 DIAGNOSIS — Z86718 Personal history of other venous thrombosis and embolism: Secondary | ICD-10-CM | POA: Diagnosis not present

## 2022-07-10 DIAGNOSIS — E43 Unspecified severe protein-calorie malnutrition: Secondary | ICD-10-CM | POA: Diagnosis not present

## 2022-07-10 DIAGNOSIS — E8721 Acute metabolic acidosis: Secondary | ICD-10-CM | POA: Diagnosis not present

## 2022-07-13 DIAGNOSIS — D696 Thrombocytopenia, unspecified: Secondary | ICD-10-CM | POA: Diagnosis not present

## 2022-07-13 DIAGNOSIS — I4581 Long QT syndrome: Secondary | ICD-10-CM | POA: Diagnosis not present

## 2022-07-13 DIAGNOSIS — E43 Unspecified severe protein-calorie malnutrition: Secondary | ICD-10-CM | POA: Diagnosis not present

## 2022-07-13 DIAGNOSIS — I130 Hypertensive heart and chronic kidney disease with heart failure and stage 1 through stage 4 chronic kidney disease, or unspecified chronic kidney disease: Secondary | ICD-10-CM | POA: Diagnosis not present

## 2022-07-13 DIAGNOSIS — Z86718 Personal history of other venous thrombosis and embolism: Secondary | ICD-10-CM | POA: Diagnosis not present

## 2022-07-13 DIAGNOSIS — E8721 Acute metabolic acidosis: Secondary | ICD-10-CM | POA: Diagnosis not present

## 2022-07-13 DIAGNOSIS — N184 Chronic kidney disease, stage 4 (severe): Secondary | ICD-10-CM | POA: Diagnosis not present

## 2022-07-13 DIAGNOSIS — I5032 Chronic diastolic (congestive) heart failure: Secondary | ICD-10-CM | POA: Diagnosis not present

## 2022-07-13 DIAGNOSIS — Z7982 Long term (current) use of aspirin: Secondary | ICD-10-CM | POA: Diagnosis not present

## 2022-07-25 DIAGNOSIS — I5032 Chronic diastolic (congestive) heart failure: Secondary | ICD-10-CM | POA: Diagnosis not present

## 2022-07-25 DIAGNOSIS — N184 Chronic kidney disease, stage 4 (severe): Secondary | ICD-10-CM | POA: Diagnosis not present

## 2022-07-25 DIAGNOSIS — Z7982 Long term (current) use of aspirin: Secondary | ICD-10-CM | POA: Diagnosis not present

## 2022-07-25 DIAGNOSIS — Z86718 Personal history of other venous thrombosis and embolism: Secondary | ICD-10-CM | POA: Diagnosis not present

## 2022-07-25 DIAGNOSIS — I4581 Long QT syndrome: Secondary | ICD-10-CM | POA: Diagnosis not present

## 2022-07-25 DIAGNOSIS — D696 Thrombocytopenia, unspecified: Secondary | ICD-10-CM | POA: Diagnosis not present

## 2022-07-25 DIAGNOSIS — I130 Hypertensive heart and chronic kidney disease with heart failure and stage 1 through stage 4 chronic kidney disease, or unspecified chronic kidney disease: Secondary | ICD-10-CM | POA: Diagnosis not present

## 2022-07-25 DIAGNOSIS — E8721 Acute metabolic acidosis: Secondary | ICD-10-CM | POA: Diagnosis not present

## 2022-07-25 DIAGNOSIS — E43 Unspecified severe protein-calorie malnutrition: Secondary | ICD-10-CM | POA: Diagnosis not present

## 2022-07-27 DIAGNOSIS — Z9181 History of falling: Secondary | ICD-10-CM | POA: Diagnosis not present

## 2022-07-27 DIAGNOSIS — M25552 Pain in left hip: Secondary | ICD-10-CM | POA: Diagnosis not present

## 2022-07-27 DIAGNOSIS — M25551 Pain in right hip: Secondary | ICD-10-CM | POA: Diagnosis not present

## 2022-07-27 DIAGNOSIS — Z79899 Other long term (current) drug therapy: Secondary | ICD-10-CM | POA: Diagnosis not present

## 2022-07-27 DIAGNOSIS — F17210 Nicotine dependence, cigarettes, uncomplicated: Secondary | ICD-10-CM | POA: Diagnosis not present

## 2022-07-27 DIAGNOSIS — I10 Essential (primary) hypertension: Secondary | ICD-10-CM | POA: Diagnosis not present

## 2022-07-27 DIAGNOSIS — M545 Low back pain, unspecified: Secondary | ICD-10-CM | POA: Diagnosis not present

## 2022-07-27 DIAGNOSIS — Z91148 Patient's other noncompliance with medication regimen for other reason: Secondary | ICD-10-CM | POA: Diagnosis not present

## 2022-07-31 DIAGNOSIS — E43 Unspecified severe protein-calorie malnutrition: Secondary | ICD-10-CM | POA: Diagnosis not present

## 2022-07-31 DIAGNOSIS — E8721 Acute metabolic acidosis: Secondary | ICD-10-CM | POA: Diagnosis not present

## 2022-07-31 DIAGNOSIS — N184 Chronic kidney disease, stage 4 (severe): Secondary | ICD-10-CM | POA: Diagnosis not present

## 2022-07-31 DIAGNOSIS — I4581 Long QT syndrome: Secondary | ICD-10-CM | POA: Diagnosis not present

## 2022-07-31 DIAGNOSIS — Z86718 Personal history of other venous thrombosis and embolism: Secondary | ICD-10-CM | POA: Diagnosis not present

## 2022-07-31 DIAGNOSIS — Z7982 Long term (current) use of aspirin: Secondary | ICD-10-CM | POA: Diagnosis not present

## 2022-07-31 DIAGNOSIS — I5032 Chronic diastolic (congestive) heart failure: Secondary | ICD-10-CM | POA: Diagnosis not present

## 2022-07-31 DIAGNOSIS — I130 Hypertensive heart and chronic kidney disease with heart failure and stage 1 through stage 4 chronic kidney disease, or unspecified chronic kidney disease: Secondary | ICD-10-CM | POA: Diagnosis not present

## 2022-07-31 DIAGNOSIS — D696 Thrombocytopenia, unspecified: Secondary | ICD-10-CM | POA: Diagnosis not present

## 2022-07-31 DIAGNOSIS — Z79899 Other long term (current) drug therapy: Secondary | ICD-10-CM | POA: Diagnosis not present

## 2022-08-08 DIAGNOSIS — F17210 Nicotine dependence, cigarettes, uncomplicated: Secondary | ICD-10-CM | POA: Diagnosis not present

## 2022-08-08 DIAGNOSIS — Z9181 History of falling: Secondary | ICD-10-CM | POA: Diagnosis not present

## 2022-08-08 DIAGNOSIS — M25551 Pain in right hip: Secondary | ICD-10-CM | POA: Diagnosis not present

## 2022-08-08 DIAGNOSIS — I10 Essential (primary) hypertension: Secondary | ICD-10-CM | POA: Diagnosis not present

## 2022-08-08 DIAGNOSIS — Z91148 Patient's other noncompliance with medication regimen for other reason: Secondary | ICD-10-CM | POA: Diagnosis not present

## 2022-08-08 DIAGNOSIS — Z79899 Other long term (current) drug therapy: Secondary | ICD-10-CM | POA: Diagnosis not present

## 2022-08-08 DIAGNOSIS — M545 Low back pain, unspecified: Secondary | ICD-10-CM | POA: Diagnosis not present

## 2022-08-08 DIAGNOSIS — M25552 Pain in left hip: Secondary | ICD-10-CM | POA: Diagnosis not present

## 2022-08-10 DIAGNOSIS — Z79899 Other long term (current) drug therapy: Secondary | ICD-10-CM | POA: Diagnosis not present

## 2022-08-22 DIAGNOSIS — Z9181 History of falling: Secondary | ICD-10-CM | POA: Diagnosis not present

## 2022-08-22 DIAGNOSIS — Z91148 Patient's other noncompliance with medication regimen for other reason: Secondary | ICD-10-CM | POA: Diagnosis not present

## 2022-08-22 DIAGNOSIS — M25552 Pain in left hip: Secondary | ICD-10-CM | POA: Diagnosis not present

## 2022-08-22 DIAGNOSIS — I10 Essential (primary) hypertension: Secondary | ICD-10-CM | POA: Diagnosis not present

## 2022-08-22 DIAGNOSIS — Z79899 Other long term (current) drug therapy: Secondary | ICD-10-CM | POA: Diagnosis not present

## 2022-08-22 DIAGNOSIS — F17210 Nicotine dependence, cigarettes, uncomplicated: Secondary | ICD-10-CM | POA: Diagnosis not present

## 2022-08-22 DIAGNOSIS — M25551 Pain in right hip: Secondary | ICD-10-CM | POA: Diagnosis not present

## 2022-08-22 DIAGNOSIS — M545 Low back pain, unspecified: Secondary | ICD-10-CM | POA: Diagnosis not present

## 2022-08-24 DIAGNOSIS — Z79899 Other long term (current) drug therapy: Secondary | ICD-10-CM | POA: Diagnosis not present

## 2022-09-21 DIAGNOSIS — M545 Low back pain, unspecified: Secondary | ICD-10-CM | POA: Diagnosis not present

## 2022-09-21 DIAGNOSIS — Z91148 Patient's other noncompliance with medication regimen for other reason: Secondary | ICD-10-CM | POA: Diagnosis not present

## 2022-09-21 DIAGNOSIS — R030 Elevated blood-pressure reading, without diagnosis of hypertension: Secondary | ICD-10-CM | POA: Diagnosis not present

## 2022-09-21 DIAGNOSIS — F17210 Nicotine dependence, cigarettes, uncomplicated: Secondary | ICD-10-CM | POA: Diagnosis not present

## 2022-09-21 DIAGNOSIS — Z79899 Other long term (current) drug therapy: Secondary | ICD-10-CM | POA: Diagnosis not present

## 2022-09-21 DIAGNOSIS — M25552 Pain in left hip: Secondary | ICD-10-CM | POA: Diagnosis not present

## 2022-09-21 DIAGNOSIS — M25551 Pain in right hip: Secondary | ICD-10-CM | POA: Diagnosis not present

## 2022-09-21 DIAGNOSIS — Z9181 History of falling: Secondary | ICD-10-CM | POA: Diagnosis not present

## 2022-09-26 DIAGNOSIS — Z79899 Other long term (current) drug therapy: Secondary | ICD-10-CM | POA: Diagnosis not present

## 2022-10-13 ENCOUNTER — Emergency Department (HOSPITAL_COMMUNITY)
Admission: EM | Admit: 2022-10-13 | Discharge: 2022-10-13 | Disposition: A | Discharge status: Home / Self Care | Payer: 59 | Attending: Emergency Medicine | Admitting: Emergency Medicine

## 2022-10-13 DIAGNOSIS — F10920 Alcohol use, unspecified with intoxication, uncomplicated: Secondary | ICD-10-CM | POA: Diagnosis not present

## 2022-10-13 DIAGNOSIS — I509 Heart failure, unspecified: Secondary | ICD-10-CM | POA: Diagnosis not present

## 2022-10-13 DIAGNOSIS — Y901 Blood alcohol level of 20-39 mg/100 ml: Secondary | ICD-10-CM | POA: Insufficient documentation

## 2022-10-13 DIAGNOSIS — Z7982 Long term (current) use of aspirin: Secondary | ICD-10-CM | POA: Insufficient documentation

## 2022-10-13 DIAGNOSIS — R55 Syncope and collapse: Secondary | ICD-10-CM | POA: Diagnosis present

## 2022-10-13 DIAGNOSIS — R404 Transient alteration of awareness: Secondary | ICD-10-CM | POA: Diagnosis not present

## 2022-10-13 DIAGNOSIS — I110 Hypertensive heart disease with heart failure: Secondary | ICD-10-CM | POA: Diagnosis not present

## 2022-10-13 DIAGNOSIS — R61 Generalized hyperhidrosis: Secondary | ICD-10-CM | POA: Diagnosis not present

## 2022-10-13 DIAGNOSIS — Z743 Need for continuous supervision: Secondary | ICD-10-CM | POA: Diagnosis not present

## 2022-10-13 DIAGNOSIS — Z79899 Other long term (current) drug therapy: Secondary | ICD-10-CM | POA: Diagnosis not present

## 2022-10-13 LAB — COMPREHENSIVE METABOLIC PANEL
ALT: 11 U/L (ref 0–44)
AST: 16 U/L (ref 15–41)
Albumin: 3.6 g/dL (ref 3.5–5.0)
Alkaline Phosphatase: 90 U/L (ref 38–126)
Anion gap: 15 (ref 5–15)
BUN: 34 mg/dL — ABNORMAL HIGH (ref 8–23)
CO2: 15 mmol/L — ABNORMAL LOW (ref 22–32)
Calcium: 9 mg/dL (ref 8.9–10.3)
Chloride: 103 mmol/L (ref 98–111)
Creatinine, Ser: 2.08 mg/dL — ABNORMAL HIGH (ref 0.61–1.24)
GFR, Estimated: 33 mL/min — ABNORMAL LOW (ref >60)
Glucose, Bld: 88 mg/dL (ref 70–99)
Potassium: 4.7 mmol/L (ref 3.5–5.1)
Sodium: 133 mmol/L — ABNORMAL LOW (ref 135–145)
Total Bilirubin: 0.8 mg/dL (ref 0.3–1.2)
Total Protein: 7.7 g/dL (ref 6.5–8.1)

## 2022-10-13 LAB — CBC WITH DIFFERENTIAL/PLATELET
Abs Immature Granulocytes: 0.02 10*3/uL (ref 0.00–0.07)
Basophils Absolute: 0 10*3/uL (ref 0.0–0.1)
Basophils Relative: 0 %
Eosinophils Absolute: 0.2 10*3/uL (ref 0.0–0.5)
Eosinophils Relative: 3 %
HCT: 44.3 % (ref 39.0–52.0)
Hemoglobin: 14 g/dL (ref 13.0–17.0)
Immature Granulocytes: 0 %
Lymphocytes Relative: 35 %
Lymphs Abs: 2.1 10*3/uL (ref 0.7–4.0)
MCH: 29.2 pg (ref 26.0–34.0)
MCHC: 31.6 g/dL (ref 30.0–36.0)
MCV: 92.3 fL (ref 80.0–100.0)
Monocytes Absolute: 0.4 10*3/uL (ref 0.1–1.0)
Monocytes Relative: 7 %
Neutro Abs: 3.3 10*3/uL (ref 1.7–7.7)
Neutrophils Relative %: 55 %
Platelets: 126 10*3/uL — ABNORMAL LOW (ref 150–400)
RBC: 4.8 MIL/uL (ref 4.22–5.81)
RDW: 13.4 % (ref 11.5–15.5)
WBC: 6 10*3/uL (ref 4.0–10.5)
nRBC: 0 % (ref 0.0–0.2)

## 2022-10-13 LAB — ETHANOL: Alcohol, Ethyl (B): 38 mg/dL — ABNORMAL HIGH (ref <10)

## 2022-10-13 LAB — CBG MONITORING, ED: Glucose-Capillary: 71 mg/dL (ref 70–99)

## 2022-10-13 MED ORDER — THIAMINE HCL 100 MG/ML IJ SOLN
100.0000 mg | Freq: Once | INTRAMUSCULAR | Status: AC
  Administered 2022-10-13: 100 mg via INTRAVENOUS
  Filled 2022-10-13: qty 2

## 2022-10-13 MED ORDER — SODIUM CHLORIDE 0.9 % IV BOLUS
1000.0000 mL | Freq: Once | INTRAVENOUS | Status: AC
  Administered 2022-10-13: 1000 mL via INTRAVENOUS

## 2022-10-13 MED ORDER — FOLIC ACID 1 MG PO TABS
1.0000 mg | ORAL_TABLET | Freq: Once | ORAL | Status: AC
  Administered 2022-10-13: 1 mg via ORAL
  Filled 2022-10-13: qty 1

## 2022-10-13 MED ORDER — ADULT MULTIVITAMIN W/MINERALS CH
1.0000 | ORAL_TABLET | Freq: Once | ORAL | Status: AC
  Administered 2022-10-13: 1 via ORAL
  Filled 2022-10-13: qty 1

## 2022-10-13 NOTE — Discharge Instructions (Addendum)
I am glad you are feeling better.  Physical exam was not concerning for acute stroke.  Lab work was reassuring.  Alcohol levels were elevated.  You are given fluids and vitamins here in the emergency room.  Seek emergency care if experiencing any new or worsening symptoms such as difficulty speaking, trouble breathing, severe chest pain, loss of consciousness.

## 2022-10-13 NOTE — ED Triage Notes (Signed)
Patient BIB EMS from home, Family called out for unresponsive, When EMS arrived patient was Alert respond to painful stimulus sitting upright in chair. Patient states he had a lot to drink today. 106/70, CBG 101. HR 70. No intake today. Clear lung sounds.

## 2022-10-13 NOTE — ED Provider Notes (Signed)
[facility] AT [facility] Provider Note   CSN: [id number] Arrival date & time: 10/13/22  1742     History  Chief Complaint  Patient presents with   Loss of Consciousness    Edward Hopkins is a 72 y.o. male with PMHx s/f CHF (EF 60-65% on last ECHO) and HTN who is brought to ED by EMS concerned for loss of consciousness. Per EMS - family called and patient was responding to painful stimulus and sitting upright in chair. Patient stating that he drank a lot of vodka today - unsure of how much. Patient states that he usually only drinks vodka and beer on weekends, but has been drinking a lot yesterday and today. Also endorses cocaine use yesterday. Patient stating that it feels like his stomach is burning and also endorses nausea.  Denies fever, chest pain, vomiting, diarrhea, paresthesias, weakness. Denies history of alcohol withdrawal symptoms such as seizures, shaking, hallucinations.   Loss of Consciousness      Home Medications Prior to Admission medications   Medication Sig Start Date End Date Taking? Authorizing Provider  amLODipine (NORVASC) 5 MG tablet Take 5 mg by mouth daily. 03/29/22   [provider]  aspirin EC 81 MG EC tablet Take 1 tablet (81 mg total) by mouth daily. Swallow whole. 09/01/21   Love, Evlyn Kanner, PA-C  buprenorphine (BUTRANS) 15 MCG/HR Place 1 patch onto the skin once a week. Monday 05/11/22   [provider]  guaiFENesin (MUCINEX) 600 MG 12 hr tablet Take 1 tablet (600 mg total) by mouth 2 (two) times daily as needed for to loosen phlegm. 06/07/22   Kathlen Mody, MD  metoprolol succinate (TOPROL-XL) 25 MG 24 hr tablet Take 25 mg by mouth daily. 03/29/22   [provider]  Multiple Vitamin (MULTIVITAMIN WITH MINERALS) TABS tablet Take 1 tablet by mouth daily. 06/08/22   Kathlen Mody, MD  naloxone [facility]) nasal spray 4 mg/0.1 mL Place 1 spray into the nose once. 03/15/22   [provider]   oxyCODONE (ROXICODONE) 15 MG immediate release tablet Take 15 mg by mouth 5 (five) times daily as needed for pain. 05/14/22   [provider]      Allergies    Patient has no known allergies.    Review of Systems   Review of Systems  Cardiovascular:  Positive for syncope.    Physical Exam Updated Vital Signs BP (!) 173/96 (BP Location: Right Arm)   Pulse 85   Temp 98.9 F (37.2 C) (Oral)   Resp 16   SpO2 98%  Physical Exam Vitals and nursing note reviewed.  Constitutional:      General: He is not in acute distress.    Appearance: Normal appearance. He is not ill-appearing, toxic-appearing or diaphoretic.  HENT:     Head: Normocephalic and atraumatic.  Eyes:     General: No scleral icterus.       Right eye: No discharge.        Left eye: No discharge.     Extraocular Movements: Extraocular movements intact.     Conjunctiva/sclera: Conjunctivae normal.  Cardiovascular:     Rate and Rhythm: Normal rate and regular rhythm.     Pulses: Normal pulses.     Heart sounds: Normal heart sounds. No murmur heard. Pulmonary:     Effort: Pulmonary effort is normal. No respiratory distress.     Breath sounds: Normal breath sounds. No wheezing, rhonchi or rales.  Abdominal:  General: Abdomen is flat. Bowel sounds are normal. There is no distension.     Palpations: Abdomen is soft. There is no mass.     Tenderness: There is no abdominal tenderness.  Musculoskeletal:     Right lower leg: No edema.     Left lower leg: No edema.  Skin:    General: Skin is warm and dry.     Findings: No rash.  Neurological:     General: No focal deficit present.     Mental Status: He is alert. Mental status is at baseline.     Sensory: No sensory deficit.     Motor: No weakness.     Comments: GCS 15. Speech is goal oriented. No deficits appreciated to CN III-XII; symmetric eyebrow raise, no facial drooping, tongue midline. Patient has equal grip strength bilaterally with 5/5 strength  against resistance in all major muscle groups bilaterally. Sensation to light touch intact. Patient moves extremities without ataxia.    Psychiatric:        Mood and Affect: Mood normal.     ED Results / Procedures / Treatments   Labs (all labs ordered are listed, but only abnormal results are displayed) Labs Reviewed  COMPREHENSIVE METABOLIC PANEL - Abnormal; Notable for the following components:      Result Value   Sodium 133 (*)    CO2 15 (*)    BUN 34 (*)    Creatinine, Ser 2.08 (*)    GFR, Estimated 33 (*)    All other components within normal limits  CBC WITH DIFFERENTIAL/PLATELET - Abnormal; Notable for the following components:   Platelets 126 (*)    All other components within normal limits  ETHANOL - Abnormal; Notable for the following components:   Alcohol, Ethyl (B) 38 (*)    All other components within normal limits  RAPID URINE DRUG SCREEN, HOSP PERFORMED  CBG MONITORING, ED    EKG None  Radiology No results found.  Procedures Procedures    Medications Ordered in ED Medications  sodium chloride 0.9 % bolus 1,000 mL (0 mLs Intravenous Stopped 10/13/22 1908)  thiamine (VITAMIN B1) injection 100 mg (100 mg Intravenous Given 10/13/22 1833)  folic acid (FOLVITE) tablet 1 mg (1 mg Oral Given 10/13/22 1832)  multivitamin with minerals tablet 1 tablet (1 tablet Oral Given 10/13/22 1832)    ED Course/ Medical Decision Making/ A&P                             Medical Decision Making Amount and/or Complexity of Data Reviewed Labs: ordered.  Risk Prescription drug management.   This patient presents to the ED for concern of syncope, this involves an extensive number of treatment options, and is a complaint that carries with it a high risk of complications and morbidity.  The differential diagnosis includes CVA, ICH, intracranial mass, critical dehydration, endocrine abnormality, sepsis/infection, electrolyte abnormality, cardiac arrhythmia.   Co morbidities  that complicate the patient evaluation  none    Lab Tests:  I Ordered, and personally interpreted labs.  The pertinent results include:   -CBG: 71 -ETOH: 38 -CBC: No concern for anemia or leukocytosis -CMP: no concern for electrolyte abnormality; no concern for kidney/liver damage   Problem List / ED Course / Critical interventions / Medication management  Patient presented to emergency room with concern for loss of consciousness.  Per EMS note patient was responsive to painful stimuli.  Patient fully conscious in emergency  room.  Patient stating that he was playing cards all day today and drinking vodka heavily.  Patient also endorses cocaine use yesterday and not eating much food today.  Physical exam unremarkable.  Neuroexam unremarkable.  Talked with patient who stated that he did not feel any symptoms that resembled the symptoms he had with his past strokes.  Patient eating sandwiches and carrying on conversation appropriately.  I believe patient's "unresponsiveness" is due to his vodka/cocaine intake combined with his lack of food intake.  I have provided patient fluids, thiamine, folate, multivitamin supplementation here in ED.  Patient stating that he feels good and is ready for discharge. Patient afebrile with stable vitals. I do not believe he needs inpatient treatment at this time.  Patient agrees stating that he is ready to go home.   I have reviewed the patients home medicines and have made adjustments as needed Patient was given return precautions. Patient stable for discharge at this time.  Patient verbalized understanding of plan.  Ddx: these are considered less likely due to history of present illness and physical exam -CVA/ICH/intracranial mass: patient without neurodeficits -Critical dehydration: BMP/CMP without concern -Sepsis/infection: SIRs criteria not met; patient afebrile without infectious symptoms -Electrolyte abnormality: BMP/CMP without concern  -Cardiac  arrhythmia: EKG shows NSR without acute ST changes   Social Determinants of Health:  none           Final Clinical Impression(s) / ED Diagnoses Final diagnoses:  Alcoholic intoxication without complication [facility])    Rx / DC Orders ED Discharge Orders     None         Margarita Rana 10/13/22 2157    Melene Plan, DO 10/13/22 2230

## 2022-10-17 ENCOUNTER — Telehealth: Payer: Self-pay | Admitting: *Deleted

## 2022-10-17 DIAGNOSIS — E7800 Pure hypercholesterolemia, unspecified: Secondary | ICD-10-CM | POA: Diagnosis not present

## 2022-10-17 DIAGNOSIS — Z9181 History of falling: Secondary | ICD-10-CM | POA: Diagnosis not present

## 2022-10-17 DIAGNOSIS — Z79899 Other long term (current) drug therapy: Secondary | ICD-10-CM | POA: Diagnosis not present

## 2022-10-17 DIAGNOSIS — M25552 Pain in left hip: Secondary | ICD-10-CM | POA: Diagnosis not present

## 2022-10-17 DIAGNOSIS — E559 Vitamin D deficiency, unspecified: Secondary | ICD-10-CM | POA: Diagnosis not present

## 2022-10-17 DIAGNOSIS — I10 Essential (primary) hypertension: Secondary | ICD-10-CM | POA: Diagnosis not present

## 2022-10-17 DIAGNOSIS — Z131 Encounter for screening for diabetes mellitus: Secondary | ICD-10-CM | POA: Diagnosis not present

## 2022-10-17 DIAGNOSIS — M25551 Pain in right hip: Secondary | ICD-10-CM | POA: Diagnosis not present

## 2022-10-17 DIAGNOSIS — M129 Arthropathy, unspecified: Secondary | ICD-10-CM | POA: Diagnosis not present

## 2022-10-17 DIAGNOSIS — Z1159 Encounter for screening for other viral diseases: Secondary | ICD-10-CM | POA: Diagnosis not present

## 2022-10-17 DIAGNOSIS — F17210 Nicotine dependence, cigarettes, uncomplicated: Secondary | ICD-10-CM | POA: Diagnosis not present

## 2022-10-17 DIAGNOSIS — M545 Low back pain, unspecified: Secondary | ICD-10-CM | POA: Diagnosis not present

## 2022-10-17 NOTE — Telephone Encounter (Signed)
Transition Care Management Unsuccessful Follow-up Telephone Call  Date of discharge and from where:  [facility] 10/12/2022  Attempts:  1st Attempt  Reason for unsuccessful TCM follow-up call:  Left voice message

## 2022-10-19 DIAGNOSIS — Z79899 Other long term (current) drug therapy: Secondary | ICD-10-CM | POA: Diagnosis not present

## 2022-11-15 DIAGNOSIS — E559 Vitamin D deficiency, unspecified: Secondary | ICD-10-CM | POA: Diagnosis not present

## 2022-11-15 DIAGNOSIS — F17210 Nicotine dependence, cigarettes, uncomplicated: Secondary | ICD-10-CM | POA: Diagnosis not present

## 2022-11-15 DIAGNOSIS — R768 Other specified abnormal immunological findings in serum: Secondary | ICD-10-CM | POA: Diagnosis not present

## 2022-11-15 DIAGNOSIS — M25552 Pain in left hip: Secondary | ICD-10-CM | POA: Diagnosis not present

## 2022-11-15 DIAGNOSIS — I10 Essential (primary) hypertension: Secondary | ICD-10-CM | POA: Diagnosis not present

## 2022-11-15 DIAGNOSIS — Z79899 Other long term (current) drug therapy: Secondary | ICD-10-CM | POA: Diagnosis not present

## 2022-11-15 DIAGNOSIS — Z9181 History of falling: Secondary | ICD-10-CM | POA: Diagnosis not present

## 2022-11-15 DIAGNOSIS — M25551 Pain in right hip: Secondary | ICD-10-CM | POA: Diagnosis not present

## 2022-11-15 DIAGNOSIS — M545 Low back pain, unspecified: Secondary | ICD-10-CM | POA: Diagnosis not present

## 2022-11-20 DIAGNOSIS — Z79899 Other long term (current) drug therapy: Secondary | ICD-10-CM | POA: Diagnosis not present

## 2022-12-13 DIAGNOSIS — M545 Low back pain, unspecified: Secondary | ICD-10-CM | POA: Diagnosis not present

## 2022-12-13 DIAGNOSIS — E559 Vitamin D deficiency, unspecified: Secondary | ICD-10-CM | POA: Diagnosis not present

## 2022-12-13 DIAGNOSIS — Z79899 Other long term (current) drug therapy: Secondary | ICD-10-CM | POA: Diagnosis not present

## 2022-12-13 DIAGNOSIS — R768 Other specified abnormal immunological findings in serum: Secondary | ICD-10-CM | POA: Diagnosis not present

## 2022-12-13 DIAGNOSIS — I10 Essential (primary) hypertension: Secondary | ICD-10-CM | POA: Diagnosis not present

## 2022-12-13 DIAGNOSIS — N1832 Chronic kidney disease, stage 3b: Secondary | ICD-10-CM | POA: Diagnosis not present

## 2022-12-13 DIAGNOSIS — M25551 Pain in right hip: Secondary | ICD-10-CM | POA: Diagnosis not present

## 2022-12-13 DIAGNOSIS — F17210 Nicotine dependence, cigarettes, uncomplicated: Secondary | ICD-10-CM | POA: Diagnosis not present

## 2022-12-13 DIAGNOSIS — M25552 Pain in left hip: Secondary | ICD-10-CM | POA: Diagnosis not present

## 2022-12-13 DIAGNOSIS — Z9181 History of falling: Secondary | ICD-10-CM | POA: Diagnosis not present

## 2022-12-15 DIAGNOSIS — Z79899 Other long term (current) drug therapy: Secondary | ICD-10-CM | POA: Diagnosis not present

## 2023-01-09 DIAGNOSIS — H2513 Age-related nuclear cataract, bilateral: Secondary | ICD-10-CM | POA: Diagnosis not present

## 2023-01-10 DIAGNOSIS — R768 Other specified abnormal immunological findings in serum: Secondary | ICD-10-CM | POA: Diagnosis not present

## 2023-01-10 DIAGNOSIS — E559 Vitamin D deficiency, unspecified: Secondary | ICD-10-CM | POA: Diagnosis not present

## 2023-01-10 DIAGNOSIS — M25551 Pain in right hip: Secondary | ICD-10-CM | POA: Diagnosis not present

## 2023-01-10 DIAGNOSIS — N1832 Chronic kidney disease, stage 3b: Secondary | ICD-10-CM | POA: Diagnosis not present

## 2023-01-10 DIAGNOSIS — M25552 Pain in left hip: Secondary | ICD-10-CM | POA: Diagnosis not present

## 2023-01-10 DIAGNOSIS — Z9181 History of falling: Secondary | ICD-10-CM | POA: Diagnosis not present

## 2023-01-10 DIAGNOSIS — Z79899 Other long term (current) drug therapy: Secondary | ICD-10-CM | POA: Diagnosis not present

## 2023-01-10 DIAGNOSIS — M545 Low back pain, unspecified: Secondary | ICD-10-CM | POA: Diagnosis not present

## 2023-01-10 DIAGNOSIS — F17210 Nicotine dependence, cigarettes, uncomplicated: Secondary | ICD-10-CM | POA: Diagnosis not present

## 2023-01-10 DIAGNOSIS — I10 Essential (primary) hypertension: Secondary | ICD-10-CM | POA: Diagnosis not present

## 2023-01-12 DIAGNOSIS — Z79899 Other long term (current) drug therapy: Secondary | ICD-10-CM | POA: Diagnosis not present

## 2023-02-06 DIAGNOSIS — M545 Low back pain, unspecified: Secondary | ICD-10-CM | POA: Diagnosis not present

## 2023-02-06 DIAGNOSIS — M25552 Pain in left hip: Secondary | ICD-10-CM | POA: Diagnosis not present

## 2023-02-06 DIAGNOSIS — F17210 Nicotine dependence, cigarettes, uncomplicated: Secondary | ICD-10-CM | POA: Diagnosis not present

## 2023-02-06 DIAGNOSIS — Z79899 Other long term (current) drug therapy: Secondary | ICD-10-CM | POA: Diagnosis not present

## 2023-02-06 DIAGNOSIS — R768 Other specified abnormal immunological findings in serum: Secondary | ICD-10-CM | POA: Diagnosis not present

## 2023-02-06 DIAGNOSIS — Z9181 History of falling: Secondary | ICD-10-CM | POA: Diagnosis not present

## 2023-02-06 DIAGNOSIS — I10 Essential (primary) hypertension: Secondary | ICD-10-CM | POA: Diagnosis not present

## 2023-02-06 DIAGNOSIS — Z5320 Procedure and treatment not carried out because of patient's decision for unspecified reasons: Secondary | ICD-10-CM | POA: Diagnosis not present

## 2023-02-06 DIAGNOSIS — N1832 Chronic kidney disease, stage 3b: Secondary | ICD-10-CM | POA: Diagnosis not present

## 2023-02-06 DIAGNOSIS — E559 Vitamin D deficiency, unspecified: Secondary | ICD-10-CM | POA: Diagnosis not present

## 2023-02-06 DIAGNOSIS — M25551 Pain in right hip: Secondary | ICD-10-CM | POA: Diagnosis not present

## 2023-02-08 DIAGNOSIS — Z79899 Other long term (current) drug therapy: Secondary | ICD-10-CM | POA: Diagnosis not present

## 2023-03-07 DIAGNOSIS — F17210 Nicotine dependence, cigarettes, uncomplicated: Secondary | ICD-10-CM | POA: Diagnosis not present

## 2023-03-07 DIAGNOSIS — N1832 Chronic kidney disease, stage 3b: Secondary | ICD-10-CM | POA: Diagnosis not present

## 2023-03-07 DIAGNOSIS — E559 Vitamin D deficiency, unspecified: Secondary | ICD-10-CM | POA: Diagnosis not present

## 2023-03-07 DIAGNOSIS — Z9181 History of falling: Secondary | ICD-10-CM | POA: Diagnosis not present

## 2023-03-07 DIAGNOSIS — Z91148 Patient's other noncompliance with medication regimen for other reason: Secondary | ICD-10-CM | POA: Diagnosis not present

## 2023-03-07 DIAGNOSIS — I10 Essential (primary) hypertension: Secondary | ICD-10-CM | POA: Diagnosis not present

## 2023-03-07 DIAGNOSIS — M545 Low back pain, unspecified: Secondary | ICD-10-CM | POA: Diagnosis not present

## 2023-03-07 DIAGNOSIS — R768 Other specified abnormal immunological findings in serum: Secondary | ICD-10-CM | POA: Diagnosis not present

## 2023-03-07 DIAGNOSIS — M25551 Pain in right hip: Secondary | ICD-10-CM | POA: Diagnosis not present

## 2023-03-07 DIAGNOSIS — Z79899 Other long term (current) drug therapy: Secondary | ICD-10-CM | POA: Diagnosis not present

## 2023-03-07 DIAGNOSIS — M25552 Pain in left hip: Secondary | ICD-10-CM | POA: Diagnosis not present

## 2023-03-09 DIAGNOSIS — Z79899 Other long term (current) drug therapy: Secondary | ICD-10-CM | POA: Diagnosis not present

## 2023-03-14 DIAGNOSIS — Z79899 Other long term (current) drug therapy: Secondary | ICD-10-CM | POA: Diagnosis not present

## 2023-03-14 DIAGNOSIS — Z91148 Patient's other noncompliance with medication regimen for other reason: Secondary | ICD-10-CM | POA: Diagnosis not present

## 2023-03-14 DIAGNOSIS — E559 Vitamin D deficiency, unspecified: Secondary | ICD-10-CM | POA: Diagnosis not present

## 2023-03-14 DIAGNOSIS — R768 Other specified abnormal immunological findings in serum: Secondary | ICD-10-CM | POA: Diagnosis not present

## 2023-03-14 DIAGNOSIS — N1832 Chronic kidney disease, stage 3b: Secondary | ICD-10-CM | POA: Diagnosis not present

## 2023-03-14 DIAGNOSIS — M25552 Pain in left hip: Secondary | ICD-10-CM | POA: Diagnosis not present

## 2023-03-14 DIAGNOSIS — M25551 Pain in right hip: Secondary | ICD-10-CM | POA: Diagnosis not present

## 2023-03-14 DIAGNOSIS — M545 Low back pain, unspecified: Secondary | ICD-10-CM | POA: Diagnosis not present

## 2023-03-14 DIAGNOSIS — I10 Essential (primary) hypertension: Secondary | ICD-10-CM | POA: Diagnosis not present

## 2023-03-14 DIAGNOSIS — F17210 Nicotine dependence, cigarettes, uncomplicated: Secondary | ICD-10-CM | POA: Diagnosis not present

## 2023-03-14 DIAGNOSIS — Z9181 History of falling: Secondary | ICD-10-CM | POA: Diagnosis not present

## 2023-03-16 DIAGNOSIS — Z79899 Other long term (current) drug therapy: Secondary | ICD-10-CM | POA: Diagnosis not present

## 2023-03-21 DIAGNOSIS — F17210 Nicotine dependence, cigarettes, uncomplicated: Secondary | ICD-10-CM | POA: Diagnosis not present

## 2023-03-21 DIAGNOSIS — Z91148 Patient's other noncompliance with medication regimen for other reason: Secondary | ICD-10-CM | POA: Diagnosis not present

## 2023-03-21 DIAGNOSIS — E559 Vitamin D deficiency, unspecified: Secondary | ICD-10-CM | POA: Diagnosis not present

## 2023-03-21 DIAGNOSIS — M545 Low back pain, unspecified: Secondary | ICD-10-CM | POA: Diagnosis not present

## 2023-03-21 DIAGNOSIS — I10 Essential (primary) hypertension: Secondary | ICD-10-CM | POA: Diagnosis not present

## 2023-03-21 DIAGNOSIS — M25551 Pain in right hip: Secondary | ICD-10-CM | POA: Diagnosis not present

## 2023-03-21 DIAGNOSIS — N1832 Chronic kidney disease, stage 3b: Secondary | ICD-10-CM | POA: Diagnosis not present

## 2023-03-21 DIAGNOSIS — M25552 Pain in left hip: Secondary | ICD-10-CM | POA: Diagnosis not present

## 2023-03-21 DIAGNOSIS — R768 Other specified abnormal immunological findings in serum: Secondary | ICD-10-CM | POA: Diagnosis not present

## 2023-03-21 DIAGNOSIS — Z79899 Other long term (current) drug therapy: Secondary | ICD-10-CM | POA: Diagnosis not present

## 2023-03-21 DIAGNOSIS — Z9181 History of falling: Secondary | ICD-10-CM | POA: Diagnosis not present

## 2023-03-23 DIAGNOSIS — Z79899 Other long term (current) drug therapy: Secondary | ICD-10-CM | POA: Diagnosis not present

## 2023-03-28 DIAGNOSIS — R768 Other specified abnormal immunological findings in serum: Secondary | ICD-10-CM | POA: Diagnosis not present

## 2023-03-28 DIAGNOSIS — M545 Low back pain, unspecified: Secondary | ICD-10-CM | POA: Diagnosis not present

## 2023-03-28 DIAGNOSIS — N1832 Chronic kidney disease, stage 3b: Secondary | ICD-10-CM | POA: Diagnosis not present

## 2023-03-28 DIAGNOSIS — Z9181 History of falling: Secondary | ICD-10-CM | POA: Diagnosis not present

## 2023-03-28 DIAGNOSIS — I10 Essential (primary) hypertension: Secondary | ICD-10-CM | POA: Diagnosis not present

## 2023-03-28 DIAGNOSIS — E559 Vitamin D deficiency, unspecified: Secondary | ICD-10-CM | POA: Diagnosis not present

## 2023-03-28 DIAGNOSIS — M25551 Pain in right hip: Secondary | ICD-10-CM | POA: Diagnosis not present

## 2023-03-28 DIAGNOSIS — F17210 Nicotine dependence, cigarettes, uncomplicated: Secondary | ICD-10-CM | POA: Diagnosis not present

## 2023-03-28 DIAGNOSIS — M25552 Pain in left hip: Secondary | ICD-10-CM | POA: Diagnosis not present

## 2023-03-28 DIAGNOSIS — Z79899 Other long term (current) drug therapy: Secondary | ICD-10-CM | POA: Diagnosis not present

## 2023-03-30 DIAGNOSIS — Z79899 Other long term (current) drug therapy: Secondary | ICD-10-CM | POA: Diagnosis not present

## 2023-04-26 DIAGNOSIS — Z9181 History of falling: Secondary | ICD-10-CM | POA: Diagnosis not present

## 2023-04-26 DIAGNOSIS — M25552 Pain in left hip: Secondary | ICD-10-CM | POA: Diagnosis not present

## 2023-04-26 DIAGNOSIS — M25551 Pain in right hip: Secondary | ICD-10-CM | POA: Diagnosis not present

## 2023-04-26 DIAGNOSIS — E559 Vitamin D deficiency, unspecified: Secondary | ICD-10-CM | POA: Diagnosis not present

## 2023-04-26 DIAGNOSIS — F17210 Nicotine dependence, cigarettes, uncomplicated: Secondary | ICD-10-CM | POA: Diagnosis not present

## 2023-04-26 DIAGNOSIS — Z79899 Other long term (current) drug therapy: Secondary | ICD-10-CM | POA: Diagnosis not present

## 2023-04-26 DIAGNOSIS — I10 Essential (primary) hypertension: Secondary | ICD-10-CM | POA: Diagnosis not present

## 2023-04-26 DIAGNOSIS — M545 Low back pain, unspecified: Secondary | ICD-10-CM | POA: Diagnosis not present

## 2023-04-26 DIAGNOSIS — R768 Other specified abnormal immunological findings in serum: Secondary | ICD-10-CM | POA: Diagnosis not present

## 2023-04-26 DIAGNOSIS — Z91148 Patient's other noncompliance with medication regimen for other reason: Secondary | ICD-10-CM | POA: Diagnosis not present

## 2023-04-26 DIAGNOSIS — N1832 Chronic kidney disease, stage 3b: Secondary | ICD-10-CM | POA: Diagnosis not present

## 2023-04-28 DIAGNOSIS — Z79899 Other long term (current) drug therapy: Secondary | ICD-10-CM | POA: Diagnosis not present

## 2023-05-02 DIAGNOSIS — F17210 Nicotine dependence, cigarettes, uncomplicated: Secondary | ICD-10-CM | POA: Diagnosis not present

## 2023-05-02 DIAGNOSIS — Z79899 Other long term (current) drug therapy: Secondary | ICD-10-CM | POA: Diagnosis not present

## 2023-05-02 DIAGNOSIS — Z9181 History of falling: Secondary | ICD-10-CM | POA: Diagnosis not present

## 2023-05-02 DIAGNOSIS — I10 Essential (primary) hypertension: Secondary | ICD-10-CM | POA: Diagnosis not present

## 2023-05-02 DIAGNOSIS — R768 Other specified abnormal immunological findings in serum: Secondary | ICD-10-CM | POA: Diagnosis not present

## 2023-05-02 DIAGNOSIS — M25551 Pain in right hip: Secondary | ICD-10-CM | POA: Diagnosis not present

## 2023-05-02 DIAGNOSIS — M545 Low back pain, unspecified: Secondary | ICD-10-CM | POA: Diagnosis not present

## 2023-05-02 DIAGNOSIS — E559 Vitamin D deficiency, unspecified: Secondary | ICD-10-CM | POA: Diagnosis not present

## 2023-05-02 DIAGNOSIS — N1832 Chronic kidney disease, stage 3b: Secondary | ICD-10-CM | POA: Diagnosis not present

## 2023-05-02 DIAGNOSIS — Z91148 Patient's other noncompliance with medication regimen for other reason: Secondary | ICD-10-CM | POA: Diagnosis not present

## 2023-05-02 DIAGNOSIS — M25552 Pain in left hip: Secondary | ICD-10-CM | POA: Diagnosis not present

## 2023-05-07 DIAGNOSIS — Z79899 Other long term (current) drug therapy: Secondary | ICD-10-CM | POA: Diagnosis not present

## 2023-05-17 DIAGNOSIS — F17210 Nicotine dependence, cigarettes, uncomplicated: Secondary | ICD-10-CM | POA: Diagnosis not present

## 2023-05-17 DIAGNOSIS — E7800 Pure hypercholesterolemia, unspecified: Secondary | ICD-10-CM | POA: Diagnosis not present

## 2023-05-17 DIAGNOSIS — Z9181 History of falling: Secondary | ICD-10-CM | POA: Diagnosis not present

## 2023-05-17 DIAGNOSIS — E559 Vitamin D deficiency, unspecified: Secondary | ICD-10-CM | POA: Diagnosis not present

## 2023-05-17 DIAGNOSIS — I10 Essential (primary) hypertension: Secondary | ICD-10-CM | POA: Diagnosis not present

## 2023-05-17 DIAGNOSIS — Z0000 Encounter for general adult medical examination without abnormal findings: Secondary | ICD-10-CM | POA: Diagnosis not present

## 2023-05-17 DIAGNOSIS — N1832 Chronic kidney disease, stage 3b: Secondary | ICD-10-CM | POA: Diagnosis not present

## 2023-05-17 DIAGNOSIS — Z131 Encounter for screening for diabetes mellitus: Secondary | ICD-10-CM | POA: Diagnosis not present

## 2023-05-17 DIAGNOSIS — R768 Other specified abnormal immunological findings in serum: Secondary | ICD-10-CM | POA: Diagnosis not present

## 2023-05-17 DIAGNOSIS — Z79899 Other long term (current) drug therapy: Secondary | ICD-10-CM | POA: Diagnosis not present

## 2023-05-17 DIAGNOSIS — M545 Low back pain, unspecified: Secondary | ICD-10-CM | POA: Diagnosis not present

## 2023-05-17 DIAGNOSIS — M25551 Pain in right hip: Secondary | ICD-10-CM | POA: Diagnosis not present

## 2023-05-17 DIAGNOSIS — M129 Arthropathy, unspecified: Secondary | ICD-10-CM | POA: Diagnosis not present

## 2023-05-17 DIAGNOSIS — M25552 Pain in left hip: Secondary | ICD-10-CM | POA: Diagnosis not present

## 2023-05-22 DIAGNOSIS — Z79899 Other long term (current) drug therapy: Secondary | ICD-10-CM | POA: Diagnosis not present

## 2023-06-01 DIAGNOSIS — Z91148 Patient's other noncompliance with medication regimen for other reason: Secondary | ICD-10-CM | POA: Diagnosis not present

## 2023-06-01 DIAGNOSIS — M25551 Pain in right hip: Secondary | ICD-10-CM | POA: Diagnosis not present

## 2023-06-01 DIAGNOSIS — F17210 Nicotine dependence, cigarettes, uncomplicated: Secondary | ICD-10-CM | POA: Diagnosis not present

## 2023-06-01 DIAGNOSIS — Z9181 History of falling: Secondary | ICD-10-CM | POA: Diagnosis not present

## 2023-06-01 DIAGNOSIS — Z79899 Other long term (current) drug therapy: Secondary | ICD-10-CM | POA: Diagnosis not present

## 2023-06-01 DIAGNOSIS — N1832 Chronic kidney disease, stage 3b: Secondary | ICD-10-CM | POA: Diagnosis not present

## 2023-06-01 DIAGNOSIS — E559 Vitamin D deficiency, unspecified: Secondary | ICD-10-CM | POA: Diagnosis not present

## 2023-06-01 DIAGNOSIS — M25552 Pain in left hip: Secondary | ICD-10-CM | POA: Diagnosis not present

## 2023-06-01 DIAGNOSIS — M545 Low back pain, unspecified: Secondary | ICD-10-CM | POA: Diagnosis not present

## 2023-06-01 DIAGNOSIS — I10 Essential (primary) hypertension: Secondary | ICD-10-CM | POA: Diagnosis not present

## 2023-06-01 DIAGNOSIS — R768 Other specified abnormal immunological findings in serum: Secondary | ICD-10-CM | POA: Diagnosis not present

## 2023-06-05 DIAGNOSIS — Z79899 Other long term (current) drug therapy: Secondary | ICD-10-CM | POA: Diagnosis not present

## 2023-07-02 DIAGNOSIS — F17210 Nicotine dependence, cigarettes, uncomplicated: Secondary | ICD-10-CM | POA: Diagnosis not present

## 2023-07-02 DIAGNOSIS — M25551 Pain in right hip: Secondary | ICD-10-CM | POA: Diagnosis not present

## 2023-07-02 DIAGNOSIS — R030 Elevated blood-pressure reading, without diagnosis of hypertension: Secondary | ICD-10-CM | POA: Diagnosis not present

## 2023-07-02 DIAGNOSIS — Z79899 Other long term (current) drug therapy: Secondary | ICD-10-CM | POA: Diagnosis not present

## 2023-07-02 DIAGNOSIS — Z91148 Patient's other noncompliance with medication regimen for other reason: Secondary | ICD-10-CM | POA: Diagnosis not present

## 2023-07-02 DIAGNOSIS — M545 Low back pain, unspecified: Secondary | ICD-10-CM | POA: Diagnosis not present

## 2023-07-02 DIAGNOSIS — M25552 Pain in left hip: Secondary | ICD-10-CM | POA: Diagnosis not present

## 2023-07-02 DIAGNOSIS — Z9181 History of falling: Secondary | ICD-10-CM | POA: Diagnosis not present

## 2023-07-07 DIAGNOSIS — Z79899 Other long term (current) drug therapy: Secondary | ICD-10-CM | POA: Diagnosis not present

## 2023-07-30 DIAGNOSIS — Z9181 History of falling: Secondary | ICD-10-CM | POA: Diagnosis not present

## 2023-07-30 DIAGNOSIS — E559 Vitamin D deficiency, unspecified: Secondary | ICD-10-CM | POA: Diagnosis not present

## 2023-07-30 DIAGNOSIS — R768 Other specified abnormal immunological findings in serum: Secondary | ICD-10-CM | POA: Diagnosis not present

## 2023-07-30 DIAGNOSIS — G8929 Other chronic pain: Secondary | ICD-10-CM | POA: Diagnosis not present

## 2023-07-30 DIAGNOSIS — M549 Dorsalgia, unspecified: Secondary | ICD-10-CM | POA: Diagnosis not present

## 2023-07-30 DIAGNOSIS — F17210 Nicotine dependence, cigarettes, uncomplicated: Secondary | ICD-10-CM | POA: Diagnosis not present

## 2023-07-30 DIAGNOSIS — I10 Essential (primary) hypertension: Secondary | ICD-10-CM | POA: Diagnosis not present

## 2023-07-30 DIAGNOSIS — Z79899 Other long term (current) drug therapy: Secondary | ICD-10-CM | POA: Diagnosis not present

## 2023-07-30 DIAGNOSIS — M25551 Pain in right hip: Secondary | ICD-10-CM | POA: Diagnosis not present

## 2023-07-30 DIAGNOSIS — M25552 Pain in left hip: Secondary | ICD-10-CM | POA: Diagnosis not present

## 2023-08-01 DIAGNOSIS — Z79899 Other long term (current) drug therapy: Secondary | ICD-10-CM | POA: Diagnosis not present

## 2023-11-23 DIAGNOSIS — H25811 Combined forms of age-related cataract, right eye: Secondary | ICD-10-CM | POA: Diagnosis not present

## 2023-12-06 DIAGNOSIS — H25811 Combined forms of age-related cataract, right eye: Secondary | ICD-10-CM | POA: Diagnosis not present

## 2023-12-06 DIAGNOSIS — Z8673 Personal history of transient ischemic attack (TIA), and cerebral infarction without residual deficits: Secondary | ICD-10-CM | POA: Diagnosis not present

## 2023-12-06 DIAGNOSIS — F17210 Nicotine dependence, cigarettes, uncomplicated: Secondary | ICD-10-CM | POA: Diagnosis not present

## 2023-12-06 DIAGNOSIS — Z950 Presence of cardiac pacemaker: Secondary | ICD-10-CM | POA: Diagnosis not present

## 2023-12-06 DIAGNOSIS — R0602 Shortness of breath: Secondary | ICD-10-CM | POA: Diagnosis not present

## 2023-12-06 DIAGNOSIS — Z79899 Other long term (current) drug therapy: Secondary | ICD-10-CM | POA: Diagnosis not present

## 2023-12-06 DIAGNOSIS — I10 Essential (primary) hypertension: Secondary | ICD-10-CM | POA: Diagnosis not present

## 2023-12-28 DIAGNOSIS — H35711 Central serous chorioretinopathy, right eye: Secondary | ICD-10-CM | POA: Diagnosis not present
